# Patient Record
Sex: Male | Born: 1975 | Race: White | Hispanic: No | Marital: Married | Smoking: Former smoker
Health system: Southern US, Community
[De-identification: ages and names within clinical notes are randomized; demographics above are authoritative.]

## PROBLEM LIST (undated history)

## (undated) DIAGNOSIS — E039 Hypothyroidism, unspecified: Secondary | ICD-10-CM

## (undated) DIAGNOSIS — M7918 Myalgia, other site: Secondary | ICD-10-CM

## (undated) DIAGNOSIS — G4730 Sleep apnea, unspecified: Secondary | ICD-10-CM

## (undated) DIAGNOSIS — I10 Essential (primary) hypertension: Secondary | ICD-10-CM

## (undated) DIAGNOSIS — E2740 Unspecified adrenocortical insufficiency: Secondary | ICD-10-CM

## (undated) DIAGNOSIS — F419 Anxiety disorder, unspecified: Secondary | ICD-10-CM

## (undated) DIAGNOSIS — M1990 Unspecified osteoarthritis, unspecified site: Secondary | ICD-10-CM

## (undated) DIAGNOSIS — G8929 Other chronic pain: Secondary | ICD-10-CM

## (undated) DIAGNOSIS — M549 Dorsalgia, unspecified: Secondary | ICD-10-CM

## (undated) DIAGNOSIS — E079 Disorder of thyroid, unspecified: Secondary | ICD-10-CM

## (undated) DIAGNOSIS — M5416 Radiculopathy, lumbar region: Secondary | ICD-10-CM

## (undated) HISTORY — DX: Essential (primary) hypertension: I10

## (undated) HISTORY — DX: Unspecified adrenocortical insufficiency: E27.40

## (undated) HISTORY — DX: Hypothyroidism, unspecified: E03.9

## (undated) HISTORY — DX: Other chronic pain: G89.29

## (undated) HISTORY — DX: Unspecified osteoarthritis, unspecified site: M19.90

## (undated) HISTORY — PX: BACK SURGERY: SHX140

## (undated) HISTORY — DX: Disorder of thyroid, unspecified: E07.9

## (undated) HISTORY — DX: Anxiety disorder, unspecified: F41.9

## (undated) HISTORY — PX: OTHER SURGICAL HISTORY: SHX169

## (undated) HISTORY — DX: Dorsalgia, unspecified: M54.9

## (undated) HISTORY — DX: Sleep apnea, unspecified: G47.30

## (undated) HISTORY — PX: ELBOW SURGERY: SHX618

---
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
## 2007-06-17 ENCOUNTER — Ambulatory Visit: Payer: Self-pay | Admitting: Specialist

## 2010-11-26 ENCOUNTER — Ambulatory Visit: Payer: Self-pay | Admitting: Orthopedic Surgery

## 2010-12-16 ENCOUNTER — Ambulatory Visit: Payer: Self-pay | Admitting: Pain Medicine

## 2010-12-31 ENCOUNTER — Ambulatory Visit: Payer: Self-pay | Admitting: Pain Medicine

## 2011-01-01 ENCOUNTER — Ambulatory Visit: Payer: Self-pay | Admitting: Pain Medicine

## 2011-01-22 ENCOUNTER — Ambulatory Visit: Payer: Self-pay | Admitting: Pain Medicine

## 2011-02-02 ENCOUNTER — Ambulatory Visit: Payer: Self-pay | Admitting: Pain Medicine

## 2011-03-16 ENCOUNTER — Ambulatory Visit: Payer: Self-pay | Admitting: Pain Medicine

## 2011-04-07 ENCOUNTER — Ambulatory Visit: Payer: Self-pay | Admitting: Pain Medicine

## 2011-05-04 ENCOUNTER — Ambulatory Visit: Payer: Self-pay | Admitting: Pain Medicine

## 2011-06-29 ENCOUNTER — Ambulatory Visit: Payer: Self-pay | Admitting: Pain Medicine

## 2012-01-12 ENCOUNTER — Ambulatory Visit: Payer: Self-pay | Admitting: Pain Medicine

## 2012-01-15 ENCOUNTER — Other Ambulatory Visit: Payer: Self-pay | Admitting: Pain Medicine

## 2012-01-15 LAB — HEPATIC FUNCTION PANEL A (ARMC)
Albumin: 3.6 g/dL (ref 3.4–5.0)
Alkaline Phosphatase: 47 U/L — ABNORMAL LOW (ref 50–136)
Bilirubin, Direct: <0.1 mg/dL (ref 0.00–0.20)
Total Protein: 7 g/dL (ref 6.4–8.2)

## 2012-01-21 ENCOUNTER — Ambulatory Visit: Payer: Self-pay | Admitting: Pain Medicine

## 2012-02-16 ENCOUNTER — Ambulatory Visit: Payer: Self-pay | Admitting: Pain Medicine

## 2012-02-25 ENCOUNTER — Ambulatory Visit: Payer: Self-pay | Admitting: Pain Medicine

## 2012-05-19 ENCOUNTER — Ambulatory Visit: Payer: Self-pay | Admitting: Pain Medicine

## 2012-06-28 ENCOUNTER — Ambulatory Visit: Payer: Self-pay | Admitting: Pain Medicine

## 2012-07-08 ENCOUNTER — Ambulatory Visit: Payer: Self-pay | Admitting: Pain Medicine

## 2012-11-17 ENCOUNTER — Ambulatory Visit: Payer: Self-pay | Admitting: Pain Medicine

## 2012-11-24 ENCOUNTER — Ambulatory Visit: Payer: Self-pay | Admitting: Pain Medicine

## 2012-12-07 ENCOUNTER — Ambulatory Visit: Payer: Self-pay | Admitting: Pain Medicine

## 2012-12-13 ENCOUNTER — Ambulatory Visit: Payer: Self-pay | Admitting: Pain Medicine

## 2012-12-19 ENCOUNTER — Other Ambulatory Visit: Payer: Self-pay | Admitting: Pain Medicine

## 2013-01-04 ENCOUNTER — Ambulatory Visit: Payer: Self-pay | Admitting: Pain Medicine

## 2013-01-17 ENCOUNTER — Ambulatory Visit: Payer: Self-pay | Admitting: Pain Medicine

## 2013-01-31 ENCOUNTER — Ambulatory Visit: Payer: Self-pay

## 2013-01-31 ENCOUNTER — Ambulatory Visit: Payer: Self-pay | Admitting: Pain Medicine

## 2013-03-17 ENCOUNTER — Ambulatory Visit: Payer: Self-pay | Admitting: Pain Medicine

## 2013-06-30 ENCOUNTER — Ambulatory Visit: Payer: Self-pay | Admitting: Pain Medicine

## 2013-11-03 ENCOUNTER — Ambulatory Visit: Payer: Self-pay | Admitting: Pain Medicine

## 2014-02-23 ENCOUNTER — Ambulatory Visit: Payer: Self-pay | Admitting: Pain Medicine

## 2014-03-20 ENCOUNTER — Ambulatory Visit: Payer: Self-pay | Admitting: Pain Medicine

## 2014-04-30 ENCOUNTER — Ambulatory Visit: Payer: Self-pay | Admitting: Pain Medicine

## 2014-05-07 ENCOUNTER — Ambulatory Visit: Payer: Self-pay | Admitting: Pain Medicine

## 2014-05-08 ENCOUNTER — Ambulatory Visit: Payer: Self-pay | Admitting: Pain Medicine

## 2014-07-17 ENCOUNTER — Emergency Department: Payer: Self-pay | Admitting: Emergency Medicine

## 2014-07-20 ENCOUNTER — Encounter: Payer: Self-pay | Admitting: Cardiovascular Disease

## 2014-07-20 ENCOUNTER — Ambulatory Visit (INDEPENDENT_AMBULATORY_CARE_PROVIDER_SITE_OTHER): Payer: 59 | Admitting: Cardiovascular Disease

## 2014-07-20 VITALS — BP 118/84 | HR 79 | Ht 73.0 in | Wt 270.8 lb

## 2014-07-20 DIAGNOSIS — I159 Secondary hypertension, unspecified: Secondary | ICD-10-CM

## 2014-07-20 DIAGNOSIS — I10 Essential (primary) hypertension: Secondary | ICD-10-CM | POA: Insufficient documentation

## 2014-07-20 DIAGNOSIS — E039 Hypothyroidism, unspecified: Secondary | ICD-10-CM | POA: Insufficient documentation

## 2014-07-20 DIAGNOSIS — E2749 Other adrenocortical insufficiency: Secondary | ICD-10-CM

## 2014-07-20 DIAGNOSIS — R0602 Shortness of breath: Secondary | ICD-10-CM

## 2014-07-20 DIAGNOSIS — R0789 Other chest pain: Secondary | ICD-10-CM | POA: Insufficient documentation

## 2014-07-20 DIAGNOSIS — E2740 Unspecified adrenocortical insufficiency: Secondary | ICD-10-CM | POA: Insufficient documentation

## 2014-07-20 DIAGNOSIS — R079 Chest pain, unspecified: Secondary | ICD-10-CM

## 2014-07-20 NOTE — Patient Instructions (Signed)
Your physician has requested that you have an echocardiogram. Echocardiography is a painless test that uses sound waves to create images of your heart. It provides your doctor with information about the size and shape of your heart and how well your heart's chambers and valves are working. This procedure takes approximately one hour. There are no restrictions for this procedure.  Your physician has requested that you have a stress echocardiogram. For further information please visit [email or web address]. Please follow instruction sheet as given.  Please follow-up with Dr. Acie Fredrickson in 1-2 months.

## 2014-07-20 NOTE — Assessment & Plan Note (Signed)
Dean Ford presents for further evaluation of chest discomfort. He has episodes of chest pain that occur typically with exertion. He also has chest discomfort with stress. Complicating  this is that he quite likely also has obstructive sleep apnea.  He has a mitral valve click on exam.  We'll see him back next week for a full echocardiogram to evaluate his LV function and valvular function. I would also like to do a stress echocardiogram to evaluate him for the possibility of coronary ischemia. He has a family history of coronary ischemia. I'll see him back in the office in one to 2 months for follow-up visit.

## 2014-07-20 NOTE — Assessment & Plan Note (Signed)
His blood pressure is fairly well controlled at present.

## 2014-07-20 NOTE — Progress Notes (Signed)
Dean Ford Date of Birth  1976/07/23       [facility] [address] [place], [place], [place]  [address]   [place], [place]  [address] [place]   Fax  [phone]     Fax [phone]  Problem List: 1. Hypertension 2. Chest pain 3.  Hypothyroidism 4. ? Obstructive sleep apnea   History of Present Illness:  [name] is a 38 who is referred by Dr. Debbora Dus for evaluation of his CP.  He has a hx of HTN and also has hypoadrenalism, hypothyroidism, low testosterone.  CP is just left of center.  Pain seems to be related to emotional stress.  Starts with dyspnea.  He would typically get shortness of breath, head ache and then get chest pain. Would take another losartan and all the symptoms would improve after an hour. Associated with exertion - walking .  Seems to be worse in the afternoon  He snores at night. Wakes up gasping for air frequently. Has been referred for sleep study.  He did not make the appt.  Does not get any regular exercise.   Has low energy due to his endocrine issues. Has had a head MRI. pituitary gland was ok. Has had cholesterol checked in the past - was borderline years ago.  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  Was not able to complete his last training session because of shortness of breath.      No current outpatient prescriptions on file prior to visit.   No current facility-administered medications on file prior to visit.    No Known Allergies  Past Medical History  Diagnosis Date  . Hypertension   . Thyroid disease   . Hypothyroid     Past Surgical History  Procedure Laterality Date  . Elbow surgery      right elbow    History  Smoking status  . Never Smoker   Smokeless tobacco  . Not on file    History  Alcohol  Use No    Family History  Problem Relation Age of Onset  . Heart attack Father 34  . Hypertension Father     Reviw of Systems:  Reviewed in the HPI.  All other systems are negative.  Physical Exam: Blood pressure 118/84, pulse 79, height 6\' 1"  (1.854 m), weight 270 lb 12 oz (122.811 kg). Wt Readings from Last 3 Encounters:  07/20/14 270 lb 12 oz (122.811 kg)     General: Well developed, well nourished, in no acute distress.  Head: Normocephalic, atraumatic, sclera non-icteric, mucus membranes are moist,   Neck: Supple. Carotids are 2 + without bruits. No JVD   Lungs: Clear   Heart: RR, mid systolic click, no significant murmur  Abdomen: Soft, non-tender, non-distended with normal bowel sounds., moderate obesity  Msk:  Strength and tone are normal   Extremities: No clubbing or cyanosis. No edema.  Distal pedal pulses are 2+ and equal    Neuro: CN II - XII intact.  Alert and oriented X 3.   Psych:  Normal   ECG: 07/20/2014: Normal sinus rhythm. Heart rate is 79. He has nonspecific ST and T wave changes.  Assessment / Plan:

## 2014-08-03 ENCOUNTER — Other Ambulatory Visit: Payer: Self-pay

## 2014-08-03 ENCOUNTER — Other Ambulatory Visit (INDEPENDENT_AMBULATORY_CARE_PROVIDER_SITE_OTHER): Payer: 59

## 2014-08-03 DIAGNOSIS — R079 Chest pain, unspecified: Secondary | ICD-10-CM

## 2014-08-03 DIAGNOSIS — R0602 Shortness of breath: Secondary | ICD-10-CM

## 2014-08-03 DIAGNOSIS — I159 Secondary hypertension, unspecified: Secondary | ICD-10-CM

## 2014-08-21 ENCOUNTER — Other Ambulatory Visit (INDEPENDENT_AMBULATORY_CARE_PROVIDER_SITE_OTHER): Payer: 59

## 2014-08-21 DIAGNOSIS — R0602 Shortness of breath: Secondary | ICD-10-CM

## 2014-08-21 DIAGNOSIS — R079 Chest pain, unspecified: Secondary | ICD-10-CM

## 2014-08-21 DIAGNOSIS — I159 Secondary hypertension, unspecified: Secondary | ICD-10-CM

## 2014-09-20 ENCOUNTER — Ambulatory Visit (INDEPENDENT_AMBULATORY_CARE_PROVIDER_SITE_OTHER): Payer: 59 | Admitting: Cardiovascular Disease

## 2014-09-20 ENCOUNTER — Encounter: Payer: Self-pay | Admitting: Cardiovascular Disease

## 2014-09-20 VITALS — BP 138/100 | HR 80 | Ht 74.0 in | Wt 278.0 lb

## 2014-09-20 DIAGNOSIS — I10 Essential (primary) hypertension: Secondary | ICD-10-CM

## 2014-09-20 MED ORDER — POTASSIUM CHLORIDE ER 10 MEQ PO TBCR: 10.0000 meq | EXTENDED_RELEASE_TABLET | Freq: Every day | ORAL | Status: DC

## 2014-09-20 MED ORDER — HYDROCHLOROTHIAZIDE 25 MG PO TABS: 25.0000 mg | ORAL_TABLET | Freq: Every day | ORAL | Status: DC

## 2014-09-20 MED ORDER — LOSARTAN POTASSIUM 100 MG PO TABS: 100.0000 mg | ORAL_TABLET | Freq: Every day | ORAL | Status: DC

## 2014-09-20 NOTE — Patient Instructions (Signed)
Your physician has recommended you make the following change in your medication:  Restart Losartan 100 mg once daily  Start Hydrochlorothiazide 25 mg once daily  Start Potassium (Kdur) 10 meq once daily    Your physician recommends that you return for lab work in:  BMP in 3-4 weeks   Your physician recommends that you schedule a follow-up appointment in:  3 months

## 2014-09-20 NOTE — Progress Notes (Signed)
Cardiology Office Note   Date:  09/20/2014   ID:  Dean Ford, DOB May 17, 1976, MRN [id number]  PCP:  Dean Dus, MD  Cardiologist:   Dean Headings, MD   Chief Complaint  Patient presents with  . Follow-up    HTN, CP   1. Hypertension 2. Chest pain 3. Hypothyroidism 4. ? Obstructive sleep apnea   History of Present Illness:  Dean Ford is a 39 who is referred by Dr. Debbora Ford for evaluation of his CP. He has a hx of HTN and also has hypoadrenalism, hypothyroidism, low testosterone.  CP is just left of center. Pain seems to be related to emotional stress. Starts with dyspnea. He would typically get shortness of breath, head ache and then get chest pain. Would take another losartan and all the symptoms would improve after an hour. Associated with exertion - walking . Seems to be worse in the afternoon  He snores at night. Wakes up gasping for air frequently. Has been referred for sleep study. He did not make the appt.  Does not get any regular exercise.  Has low energy due to his endocrine issues. Has had a head MRI. pituitary gland was ok. Has had cholesterol checked in the past - was borderline years ago.  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   September 20, 2014:  Dean Ford is a 39 y.o. male who presents for follow-up for his chest pain. He had an echocardiogram that revealed normal left ventricle systolic function. He had a stress echo ( results are not found at this time) He has not been on his BP med.   BP has been higher .    No further CP,  Not exercising at all.     Past Medical History  Diagnosis Date  . Hypertension   . Thyroid disease   . Hypothyroid     Past Surgical History  Procedure Laterality Date  . Elbow surgery        right elbow     Current Outpatient Prescriptions  Medication Sig Dispense Refill  . hydrocortisone (CORTEF) 10 MG tablet Take 10 mg by mouth daily.     Marland Kitchen levothyroxine (SYNTHROID, LEVOTHROID) 75 MCG tablet Take 75 mcg by mouth daily before breakfast.     . testosterone cypionate (DEPOTESTOTERONE CYPIONATE) 200 MG/ML injection Inject 100 mg into the muscle every 14 (fourteen) days.     . traMADol (ULTRAM) 50 MG tablet Take 100 mg by mouth every 6 (six) hours as needed.     Marland Kitchen losartan (COZAAR) 100 MG tablet Take 100 mg by mouth daily.      No current facility-administered medications for this visit.    Allergies:   Review of patient's allergies indicates no known allergies.    Social History:  The patient  reports that he has never smoked. He does not have any smokeless tobacco history on file. He reports that he does not drink alcohol or use illicit drugs.   Family History:  The patient's family history includes Heart attack (age of onset: 62) in his father; Hypertension in his father.    ROS:  Please see the history of present illness.    Review of Systems: Constitutional:  denies fever, chills, diaphoresis,  appetite change and fatigue.  HEENT: denies photophobia, eye pain, redness, hearing loss, ear pain, congestion, sore throat, rhinorrhea, sneezing, neck pain, neck stiffness and tinnitus.  Respiratory: denies SOB, DOE, cough, chest tightness, and wheezing.  Cardiovascular: denies chest pain, palpitations and leg swelling.  Gastrointestinal: denies nausea, vomiting, abdominal pain, diarrhea, constipation, blood in stool.  Genitourinary: denies dysuria, urgency, frequency, hematuria, flank pain and difficulty urinating.  Musculoskeletal: denies  myalgias, back pain, joint swelling, arthralgias and gait problem.   Skin: denies pallor, rash and wound.  Neurological: denies dizziness, seizures, syncope, weakness, light-headedness, numbness and headaches.   Hematological: denies  adenopathy, easy bruising, personal or family bleeding history.  Psychiatric/ Behavioral: denies suicidal ideation, mood changes, confusion, nervousness, sleep disturbance and agitation.       All other systems are reviewed and negative.    PHYSICAL EXAM: VS:  BP 138/100 mmHg  Pulse 80  Ht 6\' 2"  (1.88 m)  Wt 278 lb (126.1 kg)  BMI 35.68 kg/m2 , BMI Body mass index is 35.68 kg/(m^2). GEN: Well nourished, well developed, in no acute distress HEENT: normal Neck: no JVD, carotid bruits, or masses Cardiac: RRR;  mmid systolic click  no murmurs, rubs, or gallops,no edema  Respiratory:  clear to auscultation bilaterally, normal work of breathing GI: soft, nontender, nondistended, + BS MS: no deformity or atrophy Skin: warm and dry, no rash Neuro:  Strength and sensation are intact Psych: normal   EKG:  EKG is not ordered today.    Recent Labs: No results found for requested labs within last 365 days.    Lipid Panel No results found for: CHOL, TRIG, HDL, CHOLHDL, VLDL, LDLCALC, LDLDIRECT    Wt Readings from Last 3 Encounters:  09/20/14 278 lb (126.1 kg)  07/20/14 270 lb 12 oz (122.811 kg)      Other studies Reviewed: Additional studies/ records that were reviewed today include:  Stress echo and resting echo . Review of the above records demonstrates: normal LV function    ASSESSMENT AND PLAN:  1. Hypertension - will restart losartan 100 .  Will also add HCTZ 25 and Kdur 10 a day.   BMP in 3 weeks. Will see him in 3 months.    2. Chest pain- not having any further episodes of CP  3. Hypothyroidism - continue meds  4. ? Obstructive sleep apnea - needs to go have his sleep study.    Current medicines are reviewed at length with the patient today.  The patient does not have concerns regarding medicines.  The following changes have been made:  Restart Losartan, HCTZ, kdur   Labs/ tests ordered today include:  No orders of the defined types were placed in this  encounter.     Disposition:   FU with me in 3 months.  BMP in 3 weeks.      Signed, Nahser, Dean Cheng, MD  09/20/2014 8:46 AM    [facility] [place], [place], [place]  [address] Phone: [phone]; Fax: [phone]

## 2014-10-11 ENCOUNTER — Other Ambulatory Visit (INDEPENDENT_AMBULATORY_CARE_PROVIDER_SITE_OTHER): Payer: 59 | Admitting: *Deleted

## 2014-10-11 DIAGNOSIS — I10 Essential (primary) hypertension: Secondary | ICD-10-CM

## 2014-10-12 LAB — BASIC METABOLIC PANEL
BUN / CREAT RATIO: 13 (ref 8–19)
BUN: 12 mg/dL (ref 6–20)
CO2: 20 mmol/L (ref 18–29)
CREATININE: 0.95 mg/dL (ref 0.76–1.27)
Calcium: 8.8 mg/dL (ref 8.7–10.2)
Chloride: 100 mmol/L (ref 97–108)
GFR, EST AFRICAN AMERICAN: 116 mL/min/{1.73_m2} (ref >59)
GFR, EST NON AFRICAN AMERICAN: 100 mL/min/{1.73_m2} (ref >59)
Glucose: 97 mg/dL (ref 65–99)
Potassium: 4.3 mmol/L (ref 3.5–5.2)
SODIUM: 137 mmol/L (ref 134–144)

## 2014-11-15 ENCOUNTER — Encounter: Payer: Self-pay | Admitting: *Deleted

## 2014-12-04 ENCOUNTER — Emergency Department
Admit: 2014-12-04 | Disposition: A | Discharge status: Home / Self Care | Payer: Self-pay | Admitting: Emergency Medicine

## 2014-12-04 LAB — CBC
HCT: 46.8 % (ref 40.0–52.0)
HGB: 15.9 g/dL (ref 13.0–18.0)
MCH: 28.7 pg (ref 26.0–34.0)
MCHC: 34 g/dL (ref 32.0–36.0)
MCV: 85 fL (ref 80–100)
PLATELETS: 235 10*3/uL (ref 150–440)
RBC: 5.54 10*6/uL (ref 4.40–5.90)
RDW: 13.5 % (ref 11.5–14.5)
WBC: 6.5 10*3/uL (ref 3.8–10.6)

## 2014-12-04 LAB — TROPONIN I
Troponin-I: <0.03 ng/mL
Troponin-I: <0.03 ng/mL

## 2014-12-04 LAB — BASIC METABOLIC PANEL
ANION GAP: 7 (ref 7–16)
BUN: 13 mg/dL
CALCIUM: 8.6 mg/dL — AB
CREATININE: 1.01 mg/dL
Chloride: 104 mmol/L
Co2: 28 mmol/L
EGFR (Non-African Amer.): >60
Glucose: 101 mg/dL — ABNORMAL HIGH
Potassium: 4 mmol/L
SODIUM: 139 mmol/L

## 2014-12-04 LAB — D-DIMER(ARMC): D-DIMER: 175 ng/mL

## 2014-12-13 ENCOUNTER — Ambulatory Visit (INDEPENDENT_AMBULATORY_CARE_PROVIDER_SITE_OTHER): Payer: 59 | Admitting: Cardiovascular Disease

## 2014-12-13 ENCOUNTER — Encounter: Payer: Self-pay | Admitting: Cardiovascular Disease

## 2014-12-13 VITALS — BP 106/78 | HR 71 | Ht 74.0 in | Wt 274.0 lb

## 2014-12-13 DIAGNOSIS — R0602 Shortness of breath: Secondary | ICD-10-CM | POA: Diagnosis not present

## 2014-12-13 NOTE — Patient Instructions (Signed)
Medication Instructions:  Your physician recommends that you continue on your current medications as directed. Please refer to the Current Medication list given to you today.   Labwork: None   Testing/Procedures: None   Follow-Up: Your physician wants you to follow-up in: 6 months with Dr.Nahser You will receive a reminder letter in the mail two months in advance. If you don't receive a letter, please call our office to schedule the follow-up appointment.   Any Other Special Instructions Will Be Listed Below (If Applicable).

## 2014-12-13 NOTE — Progress Notes (Signed)
Cardiology Office Note   Date:  12/13/2014   ID:  Dean Ford, DOB [birth year], MRN [id number]  PCP:  Keith Rake, MD  Cardiologist:   Thayer Headings, MD   Chief Complaint  Patient presents with  . Chest Pain    3 month follow up. Meds reviewed by the patient verbally. Pt. went to [facility] ER last week with chest pain.    1. Hypertension 2. Chest pain - had a negative stress echo in Jan. 2016 3. Hypothyroidism 4. ? Obstructive sleep apnea   History of Present Illness:  Dean Ford is a 39 who is referred by Dr. Debbora Dus for evaluation of his CP. He has a hx of HTN and also has hypoadrenalism, hypothyroidism, low testosterone.  CP is just left of center. Pain seems to be related to emotional stress. Starts with dyspnea. He would typically get shortness of breath, head ache and then get chest pain. Would take another losartan and all the symptoms would improve after an hour. Associated with exertion - walking . Seems to be worse in the afternoon  He snores at night. Wakes up gasping for air frequently. Has been referred for sleep study. He did not make the appt.  Does not get any regular exercise.  Has low energy due to his endocrine issues. Has had a head MRI. pituitary gland was ok. Has had cholesterol checked in the past - was borderline years ago.  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   September 20, 2014:  Dean Ford is a 39 y.o. male who presents for follow-up for his chest pain. He had an echocardiogram that revealed normal left ventricle systolic function. He had a stress echo ( results are not found at this time) He has not been on his BP med.   BP has been higher .    No further CP,  Not exercising at all.   December 13, 2014:  Dean Ford had some  chest pain - mid sternal, radiated through his back Worsened with deep breath / deep exhale Went to the [facility] ER  Work up was negative  Has felt fine since that time.   Has been working out vigorously without any chest    Past Medical History  Diagnosis Date  . Hypertension   . Thyroid disease   . Hypothyroid     Past Surgical History  Procedure Laterality Date  . Elbow surgery      right elbow     Current Outpatient Prescriptions  Medication Sig Dispense Refill  . hydrochlorothiazide (HYDRODIURIL) 25 MG tablet Take 1 tablet (25 mg total) by mouth daily. 30 tablet 6  . hydrocortisone (CORTEF) 10 MG tablet Take 10 mg by mouth daily.     Marland Kitchen levothyroxine (SYNTHROID, LEVOTHROID) 75 MCG tablet Take 75 mcg by mouth daily before breakfast.     . losartan (COZAAR) 100 MG tablet Take 1 tablet (100 mg total) by mouth daily. 30 tablet 6  . potassium chloride (K-DUR) 10 MEQ tablet Take 1 tablet (10 mEq total) by mouth daily. 30 tablet 6  . testosterone cypionate (DEPOTESTOTERONE CYPIONATE) 200 MG/ML injection Inject 100 mg into the muscle every 14 (fourteen) days.     . traMADol (ULTRAM) 50 MG tablet Take  100 mg by mouth every 6 (six) hours as needed.      No current facility-administered medications for this visit.    Allergies:   Review of patient's allergies indicates no known allergies.    Social History:  The patient  reports that he has never smoked. He does not have any smokeless tobacco history on file. He reports that he does not drink alcohol or use illicit drugs.   Family History:  The patient's family history includes Heart attack (age of onset: 59) in his father; Hypertension in his father.    ROS:  Please see the history of present illness.    Review of Systems: Constitutional:  denies fever, chills, diaphoresis, appetite change and fatigue.  HEENT: denies photophobia, eye pain, redness, hearing loss, ear pain, congestion, sore throat, rhinorrhea, sneezing, neck  pain, neck stiffness and tinnitus.  Respiratory: denies SOB, DOE, cough, chest tightness, and wheezing.  Cardiovascular: denies chest pain, palpitations and leg swelling.  Gastrointestinal: denies nausea, vomiting, abdominal pain, diarrhea, constipation, blood in stool.  Genitourinary: denies dysuria, urgency, frequency, hematuria, flank pain and difficulty urinating.  Musculoskeletal: denies  myalgias, back pain, joint swelling, arthralgias and gait problem.   Skin: denies pallor, rash and wound.  Neurological: denies dizziness, seizures, syncope, weakness, light-headedness, numbness and headaches.   Hematological: denies adenopathy, easy bruising, personal or family bleeding history.  Psychiatric/ Behavioral: denies suicidal ideation, mood changes, confusion, nervousness, sleep disturbance and agitation.       All other systems are reviewed and negative.    PHYSICAL EXAM: VS:  BP 106/78 mmHg  Pulse 71  Ht 6\' 2"  (1.88 m)  Wt 274 lb (124.286 kg)  BMI 35.16 kg/m2 , BMI Body mass index is 35.16 kg/(m^2). GEN: Well nourished, well developed, in no acute distress HEENT: normal Neck: no JVD, carotid bruits, or masses Cardiac: RRR;  mmid systolic click  no murmurs, rubs, or gallops,no edema  Respiratory:  clear to auscultation bilaterally, normal work of breathing GI: soft, nontender, nondistended, + BS MS: no deformity or atrophy Skin: warm and dry, no rash Neuro:  Strength and sensation are intact Psych: normal   EKG:  EKG is ordered today.  ECG reveals:  NSR at 76.  NS ST abn.  No acute changes     Recent Labs: 10/11/2014: BUN 12; Creatinine 0.95; Potassium 4.3; Sodium 137    Lipid Panel No results found for: CHOL, TRIG, HDL, CHOLHDL, VLDL, LDLCALC, LDLDIRECT    Wt Readings from Last 3 Encounters:  12/13/14 274 lb (124.286 kg)  09/20/14 278 lb (126.1 kg)  07/20/14 270 lb 12 oz (122.811 kg)      Other studies Reviewed: Additional studies/ records that were reviewed  today include:  Stress echo and resting echo . Review of the above records demonstrates: normal LV function    ASSESSMENT AND PLAN:  1. Hypertension - his BP is well controlled.  Continue same meds.  I've encouraged him to work on weight loss   2. Chest pain- he had an episode of chest pain that was very atypical. His workup at [facility] ER  was   negative. He had a negative stress echo in January. I do not think that he needs any additional workup.   3. Hypothyroidism - continue meds  4. ? Obstructive sleep apnea - needs to go have his sleep study.   5. Adrenal insufficiency:. He's followed by an endocrinologist. Continue current dose of Cortef.   Current medicines are reviewed at length with the  patient today.  The patient does not have concerns regarding medicines.  The following changes have been made: No changes.   Labs/ tests ordered today include:   Orders Placed This Encounter  Procedures  . EKG 12-Lead     Disposition:   FU with me in 6 months.     Signed, Nahser, Wonda Cheng, MD  12/13/2014 8:49 AM    [facility] [place], [place], [place]  [address] Phone: [phone]; Fax: [phone]

## 2015-01-28 ENCOUNTER — Ambulatory Visit (INDEPENDENT_AMBULATORY_CARE_PROVIDER_SITE_OTHER): Payer: 59 | Admitting: Family Medicine

## 2015-01-28 ENCOUNTER — Encounter (INDEPENDENT_AMBULATORY_CARE_PROVIDER_SITE_OTHER): Payer: Self-pay

## 2015-01-28 ENCOUNTER — Encounter: Payer: Self-pay | Admitting: Family Medicine

## 2015-01-28 VITALS — BP 110/60 | HR 103 | Resp 16 | Ht 72.0 in | Wt 272.3 lb

## 2015-01-28 DIAGNOSIS — E2749 Other adrenocortical insufficiency: Secondary | ICD-10-CM | POA: Diagnosis not present

## 2015-01-28 DIAGNOSIS — F411 Generalized anxiety disorder: Secondary | ICD-10-CM

## 2015-01-28 DIAGNOSIS — F419 Anxiety disorder, unspecified: Secondary | ICD-10-CM

## 2015-01-28 DIAGNOSIS — R638 Other symptoms and signs concerning food and fluid intake: Secondary | ICD-10-CM | POA: Insufficient documentation

## 2015-01-28 HISTORY — DX: Anxiety disorder, unspecified: F41.9

## 2015-01-28 MED ORDER — CLONAZEPAM 0.5 MG PO TABS: ORAL_TABLET | ORAL | Status: DC

## 2015-01-28 NOTE — Progress Notes (Signed)
Name: Dean Ford   MRN: [id number]    DOB: Jan 01, 1976   Date:01/28/2015       Progress Note  Subjective  Chief Complaint  Chief Complaint  Patient presents with  . Anxiety    HPI Comments: Had dizziness and lightheadedness two days ago (Saturday), where he was outside during the day from 8AM to [place] and then later in the afternoon to [place] the yard. He was staying hydrated throughout the day but had a beer Saturday evening and developed a headache, nauseated feeling, and malaise. He was staying hydrated and had water plus gatorade.  Anxiety Presents for follow-up visit. Symptoms include confusion, dizziness, insomnia, irritability, muscle tension, nausea and nervous/anxious behavior. Patient reports no panic, restlessness or shortness of breath. Symptoms occur most days. The severity of symptoms is moderate.   There is no history of bipolar disorder or depression. Past treatments include benzodiazephines. Compliance with prior treatments has been good.  Dizziness The current episode started in the past 7 days. The problem has been resolved. Associated symptoms include nausea and weakness. Pertinent negatives include no chills or fever. He has tried drinking and lying down for the symptoms. The treatment provided significant relief.      Past Medical History  Diagnosis Date  . Hypertension   . Thyroid disease   . Hypothyroid   . Anxiety     Past Surgical History  Procedure Laterality Date  . Elbow surgery      right elbow    Family History  Problem Relation Age of Onset  . Heart attack Father 51  . Hypertension Father     History   Social History  . Marital Status: Married    Spouse Name: N/A  . Number of Children: N/A  . Years of Education: N/A   Occupational History  . Not on file.   Social History Main Topics  . Smoking status: Former Research scientist (life sciences)  . Smokeless tobacco: Former Systems developer  . Alcohol Use: No  . Drug Use: No  . Sexual Activity: Not on file   Other Topics  Concern  . Not on file   Social History Narrative     Current outpatient prescriptions:  .  clonazePAM (KLONOPIN) 0.5 MG tablet, 0.84m in AM PRN, 0.571mQHS PRN, Disp: 45 tablet, Rfl: 0 .  hydrochlorothiazide (HYDRODIURIL) 25 MG tablet, Take 1 tablet (25 mg total) by mouth daily., Disp: 30 tablet, Rfl: 6 .  hydrocortisone (CORTEF) 10 MG tablet, Take 10 mg by mouth daily. , Disp: , Rfl:  .  levothyroxine (SYNTHROID, LEVOTHROID) 75 MCG tablet, Take 75 mcg by mouth daily before breakfast. , Disp: , Rfl:  .  losartan (COZAAR) 100 MG tablet, Take 1 tablet (100 mg total) by mouth daily., Disp: 30 tablet, Rfl: 6 .  potassium chloride (K-DUR) 10 MEQ tablet, Take 1 tablet (10 mEq total) by mouth daily., Disp: 30 tablet, Rfl: 6 .  testosterone cypionate (DEPOTESTOTERONE CYPIONATE) 200 MG/ML injection, Inject 100 mg into the muscle every 14 (fourteen) days. , Disp: , Rfl:  .  traMADol (ULTRAM) 50 MG tablet, Take 100 mg by mouth every 6 (six) hours as needed. , Disp: , Rfl:   No Known Allergies   Review of Systems  Constitutional: Positive for malaise/fatigue and irritability. Negative for fever and chills.  Respiratory: Negative for shortness of breath.   Gastrointestinal: Positive for nausea.  Neurological: Positive for dizziness and weakness.  Psychiatric/Behavioral: Positive for confusion. Negative for depression. The patient is nervous/anxious and has  insomnia.       Objective  Filed Vitals:   01/28/15 0845  BP: 110/60  Pulse: 103  Resp: 16  Height: 6' (1.829 m)  Weight: 272 lb 4.8 oz (123.514 kg)  SpO2: 97%    Physical Exam  Constitutional: He is oriented to person, place, and time and well-developed, well-nourished, and in no distress.  HENT:  Head: Normocephalic and atraumatic.  Cardiovascular: Normal rate and regular rhythm.   Pulmonary/Chest: Effort normal.  Neurological: He is alert and oriented to person, place, and time.  Psychiatric: Mood, memory, affect and  judgment normal.  Nursing note and vitals reviewed.         Assessment & Plan  1. Generalized anxiety disorder We will change clonazepam to 0.25 mg in a.m. and 0.5 mg at bedtime as needed for symptoms of anxiety. Appears to be working well for patient. Refills provided and follow-up in 4-6 weeks. - clonazePAM (KLONOPIN) 0.5 MG tablet; 0.95m in AM PRN, 0.554mQHS PRN  Dispense: 45 tablet; Refill: 0  2. Dehydration symptoms Patient apparently experienced symptoms of dizziness, headaches, nausea, and malaise. Most likely from dehydration. Symptoms have resolved today. We will check a metabolic panel to evaluate. - Comp Met (CMET)  3. Adrenal failure  Patient is being followed by endocrinology    SyOkey Dupre. [facility] 01/28/2015 9:20 AM

## 2015-01-29 LAB — COMPREHENSIVE METABOLIC PANEL
A/G RATIO: 2.2 (ref 1.1–2.5)
ALBUMIN: 4.7 g/dL (ref 3.5–5.5)
ALT: 29 IU/L (ref 0–44)
AST: 21 IU/L (ref 0–40)
Alkaline Phosphatase: 38 IU/L — ABNORMAL LOW (ref 39–117)
BUN/Creatinine Ratio: 12 (ref 9–20)
BUN: 13 mg/dL (ref 6–24)
Bilirubin Total: 0.4 mg/dL (ref 0.0–1.2)
CALCIUM: 9.7 mg/dL (ref 8.7–10.2)
CO2: 24 mmol/L (ref 18–29)
Chloride: 102 mmol/L (ref 97–108)
Creatinine, Ser: 1.13 mg/dL (ref 0.76–1.27)
GFR calc Af Amer: 93 mL/min/{1.73_m2} (ref >59)
GFR, EST NON AFRICAN AMERICAN: 81 mL/min/{1.73_m2} (ref >59)
Globulin, Total: 2.1 g/dL (ref 1.5–4.5)
Glucose: 100 mg/dL — ABNORMAL HIGH (ref 65–99)
Potassium: 4.7 mmol/L (ref 3.5–5.2)
SODIUM: 144 mmol/L (ref 134–144)
Total Protein: 6.8 g/dL (ref 6.0–8.5)

## 2015-03-01 ENCOUNTER — Ambulatory Visit (INDEPENDENT_AMBULATORY_CARE_PROVIDER_SITE_OTHER): Payer: 59 | Admitting: Family Medicine

## 2015-03-01 ENCOUNTER — Encounter: Payer: Self-pay | Admitting: Family Medicine

## 2015-03-01 VITALS — BP 115/80 | HR 105 | Temp 98.6°F | Resp 17 | Ht 72.0 in | Wt 277.1 lb

## 2015-03-01 DIAGNOSIS — R109 Unspecified abdominal pain: Secondary | ICD-10-CM

## 2015-03-01 DIAGNOSIS — R1012 Left upper quadrant pain: Secondary | ICD-10-CM

## 2015-03-01 DIAGNOSIS — E785 Hyperlipidemia, unspecified: Secondary | ICD-10-CM | POA: Insufficient documentation

## 2015-03-01 DIAGNOSIS — F411 Generalized anxiety disorder: Secondary | ICD-10-CM

## 2015-03-01 DIAGNOSIS — E782 Mixed hyperlipidemia: Secondary | ICD-10-CM | POA: Insufficient documentation

## 2015-03-01 MED ORDER — CLONAZEPAM 0.5 MG PO TABS: ORAL_TABLET | ORAL | Status: DC

## 2015-03-01 NOTE — Progress Notes (Signed)
Name: Dean Ford   MRN: [id number]    DOB: 11-Feb-1976   Date:03/01/2015       Progress Note  Subjective  Chief Complaint  Chief Complaint  Patient presents with  . Follow-up    4wk  . Back Pain    lower back left side    Anxiety Presents for follow-up visit. Symptoms include decreased concentration, excessive worry, irritability, malaise, muscle tension and nervous/anxious behavior. Patient reports no nausea.   Past treatments include benzodiazephines. The treatment provided significant relief. Compliance with prior treatments has been good.   patient on clonazepam 0.25 mg in a.m. and 0.5 mg daily at bedtime. This has worked well for him in reducing his symptoms of anxiety. Pt. Is here for left sided flank/back pain. Present since last 2 days. He describes it as if its 'coming from my kidneys'. When he woke up two days ago, the area felt 'hot' but no rashes. No no urinary problems such as dysuria, hematuria, frequency, fevers, chills, nausea, or vomiting. Pain is worse with palpation. He has not taken anything for this pain. No history of kidney disease.   Past Medical History  Diagnosis Date  . Hypertension   . Thyroid disease   . Hypothyroid   . Anxiety     Past Surgical History  Procedure Laterality Date  . Elbow surgery      right elbow    Family History  Problem Relation Age of Onset  . Heart attack Father 3  . Hypertension Father     History   Social History  . Marital Status: Married    Spouse Name: N/A  . Number of Children: N/A  . Years of Education: N/A   Occupational History  . Not on file.   Social History Main Topics  . Smoking status: Former Research scientist (life sciences)  . Smokeless tobacco: Former Systems developer  . Alcohol Use: No  . Drug Use: No  . Sexual Activity: Not on file   Other Topics Concern  . Not on file   Social History Narrative     Current outpatient prescriptions:  .  clonazePAM (KLONOPIN) 0.5 MG tablet, 0.25mg  in AM PRN, 0.5mg  QHS PRN, Disp: 45  tablet, Rfl: 0 .  hydrochlorothiazide (HYDRODIURIL) 25 MG tablet, Take 1 tablet (25 mg total) by mouth daily., Disp: 30 tablet, Rfl: 6 .  hydrocortisone (CORTEF) 10 MG tablet, Take 10 mg by mouth daily. , Disp: , Rfl:  .  levothyroxine (SYNTHROID, LEVOTHROID) 75 MCG tablet, Take 75 mcg by mouth daily before breakfast. , Disp: , Rfl:  .  losartan (COZAAR) 100 MG tablet, Take 1 tablet (100 mg total) by mouth daily., Disp: 30 tablet, Rfl: 6 .  potassium chloride (K-DUR) 10 MEQ tablet, Take 1 tablet (10 mEq total) by mouth daily., Disp: 30 tablet, Rfl: 6 .  testosterone cypionate (DEPOTESTOTERONE CYPIONATE) 200 MG/ML injection, Inject 100 mg into the muscle every 14 (fourteen) days. , Disp: , Rfl:  .  traMADol (ULTRAM) 50 MG tablet, Take 100 mg by mouth every 6 (six) hours as needed. , Disp: , Rfl:   No Known Allergies   Review of Systems  Constitutional: Positive for irritability. Negative for fever, chills and malaise/fatigue.  Gastrointestinal: Negative for nausea and vomiting.  Genitourinary: Positive for flank pain. Negative for dysuria, urgency, frequency and hematuria.  Psychiatric/Behavioral: Positive for decreased concentration. Negative for depression. The patient is nervous/anxious.       Objective  Filed Vitals:   03/01/15 0816  BP: 115/80  Pulse: 105  Temp: 98.6 F (37 C)  TempSrc: Oral  Resp: 17  Height: 6' (1.829 m)  Weight: 277 lb 1.6 oz (125.692 kg)  SpO2: 95%    Physical Exam  Constitutional: He is oriented to person, place, and time and well-developed, well-nourished, and in no distress.  Cardiovascular: Normal rate and regular rhythm.   Pulmonary/Chest: Effort normal and breath sounds normal.  Abdominal: Soft. Bowel sounds are normal.  Musculoskeletal:       Lumbar back: He exhibits tenderness and pain. He exhibits no spasm.       Back:  Neurological: He is alert and oriented to person, place, and time.  Skin: Skin is warm and dry.  Psychiatric: Mood,  memory, affect and judgment normal.  Nursing note and vitals reviewed.    Assessment & Plan 1. Acute left flank pain We will rule out renal pathology by obtaining kidney function, urinalysis, and a renal ultrasound for evaluation of this acute left-sided flank pain. Patient will follow-up after workup is complete. - Urinalysis, Routine w reflex microscopic - Comprehensive Metabolic Panel (CMET) - CBC w/Diff - US Renal; Future  2. Generalized anxiety disorder Symptoms responsive to therapy. Patient taking medication as needed. Refills provided. - clonazePAM (KLONOPIN) 0.5 MG tablet; 0.25mg  in AM PRN, 0.5mg  QHS PRN  Dispense: 45 tablet; Refill: 0  Colbert Curenton [facility] 03/01/2015 8:27 AM

## 2015-03-02 LAB — CBC WITH DIFFERENTIAL/PLATELET
BASOS: 0 %
Basophils Absolute: 0 10*3/uL (ref 0.0–0.2)
EOS (ABSOLUTE): 0.1 10*3/uL (ref 0.0–0.4)
Eos: 2 %
Hematocrit: 46 % (ref 37.5–51.0)
Hemoglobin: 15.8 g/dL (ref 12.6–17.7)
IMMATURE GRANS (ABS): 0 10*3/uL (ref 0.0–0.1)
Immature Granulocytes: 0 %
Lymphocytes Absolute: 1.8 10*3/uL (ref 0.7–3.1)
Lymphs: 34 %
MCH: 28.7 pg (ref 26.6–33.0)
MCHC: 34.3 g/dL (ref 31.5–35.7)
MCV: 84 fL (ref 79–97)
MONOS ABS: 0.5 10*3/uL (ref 0.1–0.9)
Monocytes: 9 %
Neutrophils Absolute: 2.9 10*3/uL (ref 1.4–7.0)
Neutrophils: 55 %
Platelets: 238 10*3/uL (ref 150–379)
RBC: 5.51 x10E6/uL (ref 4.14–5.80)
RDW: 13.5 % (ref 12.3–15.4)
WBC: 5.4 10*3/uL (ref 3.4–10.8)

## 2015-03-02 LAB — COMPREHENSIVE METABOLIC PANEL
A/G RATIO: 2 (ref 1.1–2.5)
ALBUMIN: 4.1 g/dL (ref 3.5–5.5)
ALK PHOS: 42 IU/L (ref 39–117)
ALT: 32 IU/L (ref 0–44)
AST: 19 IU/L (ref 0–40)
BUN/Creatinine Ratio: 15 (ref 9–20)
BUN: 13 mg/dL (ref 6–24)
CALCIUM: 8.8 mg/dL (ref 8.7–10.2)
CO2: 21 mmol/L (ref 18–29)
CREATININE: 0.89 mg/dL (ref 0.76–1.27)
Chloride: 101 mmol/L (ref 97–108)
GFR calc Af Amer: 124 mL/min/{1.73_m2} (ref >59)
GFR, EST NON AFRICAN AMERICAN: 107 mL/min/{1.73_m2} (ref >59)
GLOBULIN, TOTAL: 2.1 g/dL (ref 1.5–4.5)
GLUCOSE: 105 mg/dL — AB (ref 65–99)
Potassium: 4.1 mmol/L (ref 3.5–5.2)
Sodium: 141 mmol/L (ref 134–144)
Total Protein: 6.2 g/dL (ref 6.0–8.5)

## 2015-03-02 LAB — URINALYSIS, ROUTINE W REFLEX MICROSCOPIC
BILIRUBIN UA: NEGATIVE
Glucose, UA: NEGATIVE
Ketones, UA: NEGATIVE
LEUKOCYTES UA: NEGATIVE
Nitrite, UA: NEGATIVE
PH UA: 5.5 (ref 5.0–7.5)
Protein, UA: NEGATIVE
RBC, UA: NEGATIVE
SPEC GRAV UA: 1.026 (ref 1.005–1.030)
UUROB: 0.2 mg/dL (ref 0.2–1.0)

## 2015-03-11 ENCOUNTER — Ambulatory Visit
Admission: RE | Admit: 2015-03-11 | Discharge: 2015-03-11 | Disposition: A | Discharge status: Home / Self Care | Payer: 59 | Source: Ambulatory Visit | Attending: Family Medicine | Admitting: Family Medicine

## 2015-03-11 DIAGNOSIS — R109 Unspecified abdominal pain: Secondary | ICD-10-CM

## 2015-03-11 DIAGNOSIS — R1012 Left upper quadrant pain: Secondary | ICD-10-CM | POA: Insufficient documentation

## 2015-03-18 ENCOUNTER — Other Ambulatory Visit: Payer: Self-pay | Admitting: Cardiovascular Disease

## 2015-04-01 ENCOUNTER — Ambulatory Visit: Payer: 59 | Admitting: Family Medicine

## 2015-06-03 ENCOUNTER — Encounter: Payer: Self-pay | Admitting: Pain Medicine

## 2015-06-03 ENCOUNTER — Ambulatory Visit: Payer: 59 | Attending: Pain Medicine | Admitting: Pain Medicine

## 2015-06-03 VITALS — BP 149/86 | HR 99 | Temp 99.0°F | Resp 18 | Ht 74.0 in | Wt 280.0 lb

## 2015-06-03 DIAGNOSIS — M47896 Other spondylosis, lumbar region: Secondary | ICD-10-CM | POA: Diagnosis not present

## 2015-06-03 DIAGNOSIS — M47816 Spondylosis without myelopathy or radiculopathy, lumbar region: Secondary | ICD-10-CM | POA: Insufficient documentation

## 2015-06-03 DIAGNOSIS — Z87891 Personal history of nicotine dependence: Secondary | ICD-10-CM | POA: Insufficient documentation

## 2015-06-03 DIAGNOSIS — M51369 Other intervertebral disc degeneration, lumbar region without mention of lumbar back pain or lower extremity pain: Secondary | ICD-10-CM | POA: Insufficient documentation

## 2015-06-03 DIAGNOSIS — E559 Vitamin D deficiency, unspecified: Secondary | ICD-10-CM | POA: Insufficient documentation

## 2015-06-03 DIAGNOSIS — F1120 Opioid dependence, uncomplicated: Secondary | ICD-10-CM | POA: Diagnosis not present

## 2015-06-03 DIAGNOSIS — F1190 Opioid use, unspecified, uncomplicated: Secondary | ICD-10-CM | POA: Diagnosis not present

## 2015-06-03 DIAGNOSIS — E039 Hypothyroidism, unspecified: Secondary | ICD-10-CM | POA: Diagnosis not present

## 2015-06-03 DIAGNOSIS — G4700 Insomnia, unspecified: Secondary | ICD-10-CM | POA: Insufficient documentation

## 2015-06-03 DIAGNOSIS — R631 Polydipsia: Secondary | ICD-10-CM | POA: Insufficient documentation

## 2015-06-03 DIAGNOSIS — M2428 Disorder of ligament, vertebrae: Secondary | ICD-10-CM | POA: Insufficient documentation

## 2015-06-03 DIAGNOSIS — M545 Low back pain, unspecified: Secondary | ICD-10-CM | POA: Insufficient documentation

## 2015-06-03 DIAGNOSIS — G8929 Other chronic pain: Secondary | ICD-10-CM | POA: Insufficient documentation

## 2015-06-03 DIAGNOSIS — Z6838 Body mass index (BMI) 38.0-38.9, adult: Secondary | ICD-10-CM

## 2015-06-03 DIAGNOSIS — I10 Essential (primary) hypertension: Secondary | ICD-10-CM | POA: Insufficient documentation

## 2015-06-03 DIAGNOSIS — F419 Anxiety disorder, unspecified: Secondary | ICD-10-CM | POA: Diagnosis not present

## 2015-06-03 DIAGNOSIS — M169 Osteoarthritis of hip, unspecified: Secondary | ICD-10-CM | POA: Insufficient documentation

## 2015-06-03 DIAGNOSIS — Z79891 Long term (current) use of opiate analgesic: Secondary | ICD-10-CM | POA: Insufficient documentation

## 2015-06-03 DIAGNOSIS — M549 Dorsalgia, unspecified: Secondary | ICD-10-CM | POA: Diagnosis present

## 2015-06-03 DIAGNOSIS — M5126 Other intervertebral disc displacement, lumbar region: Secondary | ICD-10-CM | POA: Insufficient documentation

## 2015-06-03 DIAGNOSIS — R252 Cramp and spasm: Secondary | ICD-10-CM | POA: Insufficient documentation

## 2015-06-03 DIAGNOSIS — M5136 Other intervertebral disc degeneration, lumbar region: Secondary | ICD-10-CM | POA: Insufficient documentation

## 2015-06-03 DIAGNOSIS — G4730 Sleep apnea, unspecified: Secondary | ICD-10-CM | POA: Insufficient documentation

## 2015-06-03 DIAGNOSIS — Z5181 Encounter for therapeutic drug level monitoring: Secondary | ICD-10-CM | POA: Insufficient documentation

## 2015-06-03 DIAGNOSIS — M519 Unspecified thoracic, thoracolumbar and lumbosacral intervertebral disc disorder: Secondary | ICD-10-CM | POA: Insufficient documentation

## 2015-06-03 DIAGNOSIS — M4600 Spinal enthesopathy, site unspecified: Secondary | ICD-10-CM | POA: Insufficient documentation

## 2015-06-03 DIAGNOSIS — G4733 Obstructive sleep apnea (adult) (pediatric): Secondary | ICD-10-CM | POA: Insufficient documentation

## 2015-06-03 HISTORY — DX: Other chronic pain: G89.29

## 2015-06-03 HISTORY — DX: Dorsalgia, unspecified: M54.9

## 2015-06-03 MED ORDER — TRAMADOL HCL 50 MG PO TABS: 100.0000 mg | ORAL_TABLET | Freq: Four times a day (QID) | ORAL | Status: DC | PRN

## 2015-06-03 NOTE — Progress Notes (Signed)
Patient's Name: Dean Ford MRN: [id number] DOB: 1976/07/11 DOS: 06/03/2015  Primary Reason(s) for Visit: Evaluation of uncontrolled established, chronic problem. CC: Back Pain   HPI:   Dean Ford is a 39 y.o. year old, male patient, who returns today as an established patient. 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[place]); Anxiety disorder; Dehydration symptoms; Acute left flank pain; Dyslipidemia; Encounter for therapeutic drug level monitoring; Long term current use of opiate analgesic; Uncomplicated opioid dependence ([place]); Opiate use; Lumbar spondylosis; Facet syndrome, lumbar; Acute low back pain; Chronic low back pain; Back pain, chronic; Insomnia, persistent; BP (high blood pressure); Adult hypothyroidism; Intermittent muscle cramps; Always thirsty; Avitaminosis D; Lumbar spine pain; Right hip pain; Hip arthrosis; Obesity; DDD (degenerative disc disease), lumbar; L4-L5 disc bulge; Ligamentum flavum hypertrophy (HCC); and Bulging lumbar disc on his problem list.. His primarily concern today is the Back Pain   the patient was doing well on his medications until he jumped out of a tree stand and began to experience increased low back pain. He tried to address this conservatively by going to a chiropractor. His pain has not gotten any better and therefore he would like to go ahead and have an interventional treatment. His low back pain seems to respond well to right lumbar facet blocks and an SI joint injection which normally use an 100-75% relief of the pain. His last treatment was a radiofrequency that was done on 03/20/2014.  Pharmacotherapy Review: Side-effects or Adverse reactions: None reported. Effectiveness: Described as relatively effective, allowing for increase in activities of daily living (ADL). Onset of action: Within expected pharmacological parameters. Duration of action: Within normal limits for medication. Peak effect: Timing and results  are as within normal expected parameters. [place] PMP: Compliant with practice rules and regulations. DST: Compliant with practice rules and regulations. Lab work: No new labs ordered by our practice. Treatment compliance: Compliant. Substance Use Disorder (SUD) Risk Level: Low Planned course of action: Continue therapy as is.  Allergies: Dean Ford is allergic to borax.  Meds: The patient has a current medication list which includes the following prescription(s): fludrocortisone, hydrochlorothiazide, levothyroxine, losartan, potassium chloride, testosterone cypionate, tramadol, clonazepam, and hydrocortisone. Requested Prescriptions   Signed Prescriptions Disp Refills  . traMADol (ULTRAM) 50 MG tablet 240 tablet 2    Sig: Take 2 tablets (100 mg total) by mouth every 6 (six) hours as needed.    ROS: Constitutional: Afebrile, no chills, well hydrated and well nourished Gastrointestinal: negative Musculoskeletal:negative Neurological: negative Behavioral/Psych: negative  PFSH: Medical:  Dean Ford  has a past medical history of Hypertension; Thyroid disease; Hypothyroid; Anxiety; Sleep apnea; Arthritis; and Hypothyroid. Family: family history includes Heart attack (age of onset: 22) in his father; Hypertension in his father. Surgical:  has past surgical history that includes Elbow surgery. Tobacco:  reports that he has quit smoking. He has quit using smokeless tobacco. Alcohol:  reports that he does not drink alcohol. Drug:  reports that he does not use illicit drugs.  Physical Exam: Vitals:  Today's Vitals   06/03/15 1040 06/03/15 1043  BP: 149/86   Pulse: 99   Temp: 99 F (37.2 C)   TempSrc: Oral   Resp: 18   Height: 6\' 2"  (1.88 m)   Weight: 280 lb (127.007 kg)   SpO2: 99%   PainSc:  4   Calculated BMI: Body mass index is 35.93 kg/(m^2). General appearance: alert, cooperative, appears stated age, moderate distress and moderately obese Eyes: conjunctivae/corneas clear.  PERRL, EOM's intact.  Fundi benign. Lungs: No evidence respiratory distress, no audible rales or ronchi and no use of accessory muscles of respiration Neck: no adenopathy, no carotid bruit, no JVD, supple, symmetrical, trachea midline and thyroid not enlarged, symmetric, no tenderness/mass/nodules Back: symmetric, no curvature. ROM normal. No CVA tenderness. Extremities: extremities normal, atraumatic, no cyanosis or edema Pulses: 2+ and symmetric Skin: Skin color, texture, turgor normal. No rashes or lesions Neurologic: Gait: Antalgic    Assessment: Encounter Diagnosis:  Primary Diagnosis: Acute low back pain [M54.5]  Plan: Dean Ford was seen today for back pain.  Diagnoses and all orders for this visit:  Acute low back pain -     LUMBAR FACET(MEDIAL BRANCH NERVE BLOCK) MBNB; Future -     SACROILIAC JOINT INJECTINS; Future  Chronic low back pain -     traMADol (ULTRAM) 50 MG tablet; Take 2 tablets (100 mg total) by mouth every 6 (six) hours as needed.  Facet syndrome, lumbar  Spondylosis of lumbar region without myelopathy or radiculopathy  Opiate use  Uncomplicated opioid dependence ([place])  Long term current use of opiate analgesic -     Drugs of abuse screen w/o alc, rtn urine-sln; Future  Encounter for therapeutic drug level monitoring     Patient Instructions   GENERAL RISKS AND COMPLICATIONS  What are the risk, side effects and possible complications? Generally speaking, most procedures are safe.  However, with any procedure there are risks, side effects, and the possibility of complications.  The risks and complications are dependent upon the sites that are lesioned, or the type of nerve block to be performed.  The closer the procedure is to the spine, the more serious the risks are.  Great care is taken when placing the radio frequency needles, block needles or lesioning probes, but sometimes complications can occur. 1. Infection: Any time there is an injection  through the skin, there is a risk of infection.  This is why sterile conditions are used for these blocks.  There are four possible types of infection. 1. Localized skin infection. 2. Central Nervous System Infection-This can be in the form of Meningitis, which can be deadly. 3. Epidural Infections-This can be in the form of an epidural abscess, which can cause pressure inside of the spine, causing compression of the spinal cord with subsequent paralysis. This would require an emergency surgery to decompress, and there are no guarantees that the patient would recover from the paralysis. 4. Discitis-This is an infection of the intervertebral discs.  It occurs in about 1% of discography procedures.  It is difficult to treat and it may lead to surgery.        2. Pain: the needles have to go through skin and soft tissues, will cause soreness.       3. Damage to internal structures:  The nerves to be lesioned may be near blood vessels or    other nerves which can be potentially damaged.       4. Bleeding: Bleeding is more common if the patient is taking blood thinners such as  aspirin, Coumadin, Ticiid, Plavix, etc., or if he/she have some genetic predisposition  such as hemophilia. Bleeding into the spinal canal can cause compression of the spinal  cord with subsequent paralysis.  This would require an emergency surgery to  decompress and there are no guarantees that the patient would recover from the  paralysis.       5. Pneumothorax:  Puncturing of a lung is a possibility, every time a needle  is introduced in  the area of the chest or upper back.  Pneumothorax refers to free air around the  collapsed lung(s), inside of the thoracic cavity (chest cavity).  Another two possible  complications related to a similar event would include: Hemothorax and Chylothorax.   These are variations of the Pneumothorax, where instead of air around the collapsed  lung(s), you may have blood or chyle, respectively.        6. Spinal headaches: They may occur with any procedures in the area of the spine.       7. Persistent CSF (Cerebro-Spinal Fluid) leakage: This is a rare problem, but may occur  with prolonged intrathecal or epidural catheters either due to the formation of a fistulous  track or a dural tear.       8. Nerve damage: By working so close to the spinal cord, there is always a possibility of  nerve damage, which could be as serious as a permanent spinal cord injury with  paralysis.       9. Death:  Although rare, severe deadly allergic reactions known as "Anaphylactic  reaction" can occur to any of the medications used.      10. Worsening of the symptoms:  We can always make thing worse.  What are the chances of something like this happening? Chances of any of this occuring are extremely low.  By statistics, you have more of a chance of getting killed in a motor vehicle accident: while driving to the hospital than any of the above occurring .  Nevertheless, you should be aware that they are possibilities.  In general, it is similar to taking a shower.  Everybody knows that you can slip, hit your head and get killed.  Does that mean that you should not shower again?  Nevertheless always keep in mind that statistics do not mean anything if you happen to be on the wrong side of them.  Even if a procedure has a 1 (one) in a 1,000,000 (million) chance of going wrong, it you happen to be that one..Also, keep in mind that by statistics, you have more of a chance of having something go wrong when taking medications.  Who should not have this procedure? If you are on a blood thinning medication (e.g. Coumadin, Plavix, see list of "Blood Thinners"), or if you have an active infection going on, you should not have the procedure.  If you are taking any blood thinners, please inform your physician.  How should I prepare for this procedure?  Do not eat or drink anything at least six hours prior to the procedure.  Bring  a driver with you .  It cannot be a taxi.  Come accompanied by an adult that can drive you back, and that is strong enough to help you if your legs get weak or numb from the local anesthetic.  Take all of your medicines the morning of the procedure with just enough water to swallow them.  If you have diabetes, make sure that you are scheduled to have your procedure done first thing in the morning, whenever possible.  If you have diabetes, take only half of your insulin dose and notify our nurse that you have done so as soon as you arrive at the clinic.  If you are diabetic, but only take blood sugar pills (oral hypoglycemic), then do not take them on the morning of your procedure.  You may take them after you have had the procedure.  Do  not take aspirin or any aspirin-containing medications, at least eleven (11) days prior to the procedure.  They may prolong bleeding.  Wear loose fitting clothing that may be easy to take off and that you would not mind if it got stained with Betadine or blood.  Do not wear any jewelry or perfume  Remove any nail coloring.  It will interfere with some of our monitoring equipment.  NOTE: Remember that this is not meant to be interpreted as a complete list of all possible complications.  Unforeseen problems may occur.  BLOOD THINNERS The following drugs contain aspirin or other products, which can cause increased bleeding during surgery and should not be taken for 2 weeks prior to and 1 week after surgery.  If you should need take something for relief of minor pain, you may take acetaminophen which is found in Tylenol,m Datril, Anacin-3 and Panadol. It is not blood thinner. The products listed below are.  Do not take any of the products listed below in addition to any listed on your instruction sheet.  A.P.C or A.P.C with Codeine Codeine Phosphate Capsules #3 Ibuprofen Ridaura  ABC compound Congesprin Imuran rimadil  Advil Cope Indocin Robaxisal   Alka-Seltzer Effervescent Pain Reliever and Antacid Coricidin or Coricidin-D  Indomethacin Rufen  Alka-Seltzer plus Cold Medicine Cosprin Ketoprofen S-A-C Tablets  Anacin Analgesic Tablets or Capsules Coumadin Korlgesic Salflex  Anacin Extra Strength Analgesic tablets or capsules CP-2 Tablets Lanoril Salicylate  Anaprox Cuprimine Capsules Levenox Salocol  Anexsia-D Dalteparin Magan Salsalate  Anodynos Darvon compound Magnesium Salicylate Sine-off  Ansaid Dasin Capsules Magsal Sodium Salicylate  Anturane Depen Capsules Marnal Soma  APF Arthritis pain formula Dewitt's Pills Measurin Stanback  Argesic Dia-Gesic Meclofenamic Sulfinpyrazone  Arthritis Bayer Timed Release Aspirin Diclofenac Meclomen Sulindac  Arthritis pain formula Anacin Dicumarol Medipren Supac  Analgesic (Safety coated) Arthralgen Diffunasal Mefanamic Suprofen  Arthritis Strength Bufferin Dihydrocodeine Mepro Compound Suprol  Arthropan liquid Dopirydamole Methcarbomol with Aspirin Synalgos  ASA tablets/Enseals Disalcid Micrainin Tagament  Ascriptin Doan's Midol Talwin  Ascriptin A/D Dolene Mobidin Tanderil  Ascriptin Extra Strength Dolobid Moblgesic Ticlid  Ascriptin with Codeine Doloprin or Doloprin with Codeine Momentum Tolectin  Asperbuf Duoprin Mono-gesic Trendar  Aspergum Duradyne Motrin or Motrin IB Triminicin  Aspirin plain, buffered or enteric coated Durasal Myochrisine Trigesic  Aspirin Suppositories Easprin Nalfon Trillsate  Aspirin with Codeine Ecotrin Regular or Extra Strength Naprosyn Uracel  Atromid-S Efficin Naproxen Ursinus  Auranofin Capsules Elmiron Neocylate Vanquish  Axotal Emagrin Norgesic Verin  Azathioprine Empirin or Empirin with Codeine Normiflo Vitamin E  Azolid Emprazil Nuprin Voltaren  Bayer Aspirin plain, buffered or children's or timed BC Tablets or powders Encaprin Orgaran Warfarin Sodium  Buff-a-Comp Enoxaparin Orudis Zorpin  Buff-a-Comp with Codeine Equegesic Os-Cal-Gesic   Buffaprin  Excedrin plain, buffered or Extra Strength Oxalid   Bufferin Arthritis Strength Feldene Oxphenbutazone   Bufferin plain or Extra Strength Feldene Capsules Oxycodone with Aspirin   Bufferin with Codeine Fenoprofen Fenoprofen Pabalate or Pabalate-SF   Buffets II Flogesic Panagesic   Buffinol plain or Extra Strength Florinal or Florinal with Codeine Panwarfarin   Buf-Tabs Flurbiprofen Penicillamine   Butalbital Compound Four-way cold tablets Penicillin   Butazolidin Fragmin Pepto-Bismol   Carbenicillin Geminisyn Percodan   Carna Arthritis Reliever Geopen Persantine   Carprofen Gold's salt Persistin   Chloramphenicol Goody's Phenylbutazone   Chloromycetin Haltrain Piroxlcam   Clmetidine heparin Plaquenil   Cllnoril Hyco-pap Ponstel   Clofibrate Hydroxy chloroquine Propoxyphen         Before stopping any  of these medications, be sure to consult the physician who ordered them.  Some, such as Coumadin (Warfarin) are ordered to prevent or treat serious conditions such as "deep thrombosis", "pumonary embolisms", and other heart problems.  The amount of time that you may need off of the medication may also vary with the medication and the reason for which you were taking it.  If you are taking any of these medications, please make sure you notify your pain physician before you undergo any procedures.         Sacroiliac (SI) Joint Injection Patient Information  Description: The sacroiliac joint connects the scrum (very low back and tailbone) to the ilium (a pelvic bone which also forms half of the hip joint).  Normally this joint experiences very little motion.  When this joint becomes inflamed or unstable low back and or hip and pelvis pain may result.  Injection of this joint with local anesthetics (numbing medicines) and steroids can provide diagnostic information and reduce pain.  This injection is performed with the aid of x-ray guidance into the tailbone area while you are lying on your  stomach.   You may experience an electrical sensation down the leg while this is being done.  You may also experience numbness.  We also may ask if we are reproducing your normal pain during the injection.  Conditions which may be treated SI injection:   Low back, buttock, hip or leg pain  Preparation for the Injection:  1. Do not eat any solid food or dairy products within 6 hours of your appointment.  2. You may drink clear liquids up to 2 hours before appointment.  Clear liquids include water, black coffee, juice or soda.  No milk or cream please. 3. You may take your regular medications, including pain medications with a sip of water before your appointment.  Diabetics should hold regular insulin (if take separately) and take 1/2 normal NPH dose the morning of the procedure.  Carry some sugar containing items with you to your appointment. 4. A driver must accompany you and be prepared to drive you home after your procedure. 5. Bring all of your current medications with you. 6. An IV may be inserted and sedation may be given at the discretion of the physician. 7. A blood pressure cuff, EKG and other monitors will often be applied during the procedure.  Some patients may need to have extra oxygen administered for a short period.  8. You will be asked to provide medical information, including your allergies, prior to the procedure.  We must know immediately if you are taking blood thinners (like Coumadin/Warfarin) or if you are allergic to IV iodine contrast (dye).  We must know if you could possible be pregnant.  Possible side effects:   Bleeding from needle site  Infection (rare, may require surgery)  Nerve injury (rare)  Numbness & tingling (temporary)  A brief convulsion or seizure  Light-headedness (temporary)  Pain at injection site (several days)  Decreased blood pressure (temporary)  Weakness in the leg (temporary)   Call if you experience:   New onset weakness or  numbness of an extremity below the injection site that last more than 8 hours.  Hives or difficulty breathing ( go to the emergency room)  Inflammation or drainage at the injection site  Any new symptoms which are concerning to you  Please note:  Although the local anesthetic injected can often make your back/ hip/ buttock/ leg feel good for several hours  after the injections, the pain will likely return.  It takes 3-7 days for steroids to work in the sacroiliac area.  You may not notice any pain relief for at least that one week.  If effective, we will often do a series of three injections spaced 3-6 weeks apart to maximally decrease your pain.  After the initial series, we generally will wait some months before a repeat injection of the same type.  If you have any questions, please call [phone] [facility]  Facet Blocks Patient Information  Description: The facets are joints in the spine between the vertebrae.  Like any joints in the body, facets can become irritated and painful.  Arthritis can also effect the facets.  By injecting steroids and local anesthetic in and around these joints, we can temporarily block the nerve supply to them.  Steroids act directly on irritated nerves and tissues to reduce selling and inflammation which often leads to decreased pain.  Facet blocks may be done anywhere along the spine from the neck to the low back depending upon the location of your pain.   After numbing the skin with local anesthetic (like Novocaine), a small needle is passed onto the facet joints under x-ray guidance.  You may experience a sensation of pressure while this is being done.  The entire block usually lasts about 15-25 minutes.   Conditions which may be treated by facet blocks:   Low back/buttock pain  Neck/shoulder pain  Certain types of headaches  Preparation for the injection:  10. Do not eat any solid food or dairy products  within 6 hours of your appointment. 11. You may drink clear liquid up to 2 hours before appointment.  Clear liquids include water, black coffee, juice or soda.  No milk or cream please. 12. You may take your regular medication, including pain medications, with a sip of water before your appointment.  Diabetics should hold regular insulin (if taken separately) and take 1/2 normal NPH dose the morning of the procedure.  Carry some sugar containing items with you to your appointment. 13. A driver must accompany you and be prepared to drive you home after your procedure. 34. Bring all your current medications with you. 15. An IV may be inserted and sedation may be given at the discretion of the physician. 16. A blood pressure cuff, EKG and other monitors will often be applied during the procedure.  Some patients may need to have extra oxygen administered for a short period. 47. You will be asked to provide medical information, including your allergies and medications, prior to the procedure.  We must know immediately if you are taking blood thinners (like Coumadin/Warfarin) or if you are allergic to IV iodine contrast (dye).  We must know if you could possible be pregnant.  Possible side-effects:   Bleeding from needle site  Infection (rare, may require surgery)  Nerve injury (rare)  Numbness & tingling (temporary)  Difficulty urinating (rare, temporary)  Spinal headache (a headache worse with upright posture)  Light-headedness (temporary)  Pain at injection site (serveral days)  Decreased blood pressure (rare, temporary)  Weakness in arm/leg (temporary)  Pressure sensation in back/neck (temporary)   Call if you experience:   Fever/chills associated with headache or increased back/neck pain  Headache worsened by an upright position  New onset, weakness or numbness of an extremity below the injection site  Hives or difficulty breathing (go to the emergency room)  Inflammation  or drainage at  the injection site(s)  Severe back/neck pain greater than usual  New symptoms which are concerning to you  Please note:  Although the local anesthetic injected can often make your back or neck feel good for several hours after the injection, the pain will likely return. It takes 3-7 days for steroids to work.  You may not notice any pain relief for at least one week.  If effective, we will often do a series of 2-3 injections spaced 3-6 weeks apart to maximally decrease your pain.  After the initial series, you may be a candidate for a more permanent nerve block of the facets.  If you have any questions, please call #[phone]) [facility]   Medications discontinued today:  Medications Discontinued During This Encounter  Medication Reason  . traMADol (ULTRAM) 50 MG tablet Reorder   Medications administered today:  Dean Ford had no medications administered during this visit.  Primary Care Physician: Keith Rake, MD Location: [facility] Outpatient Pain Management Facility Note by: Zafir Schauer A. Dossie Arbour, M.D, DABA, DABAPM, DABPM, DABIPP, FIPP

## 2015-06-03 NOTE — Progress Notes (Signed)
Safety precautions to be maintained throughout the outpatient stay will include: orient to surroundings, keep bed in low position, maintain call bell within reach at all times, provide assistance with transfer out of bed and ambulation.  

## 2015-06-03 NOTE — Patient Instructions (Signed)
GENERAL RISKS AND COMPLICATIONS  What are the risk, side effects and possible complications? Generally speaking, most procedures are safe.  However, with any procedure there are risks, side effects, and the possibility of complications.  The risks and complications are dependent upon the sites that are lesioned, or the type of nerve block to be performed.  The closer the procedure is to the spine, the more serious the risks are.  Great care is taken when placing the radio frequency needles, block needles or lesioning probes, but sometimes complications can occur. 1. Infection: Any time there is an injection through the skin, there is a risk of infection.  This is why sterile conditions are used for these blocks.  There are four possible types of infection. 1. Localized skin infection. 2. Central Nervous System Infection-This can be in the form of Meningitis, which can be deadly. 3. Epidural Infections-This can be in the form of an epidural abscess, which can cause pressure inside of the spine, causing compression of the spinal cord with subsequent paralysis. This would require an emergency surgery to decompress, and there are no guarantees that the patient would recover from the paralysis. 4. Discitis-This is an infection of the intervertebral discs.  It occurs in about 1% of discography procedures.  It is difficult to treat and it may lead to surgery.        2. Pain: the needles have to go through skin and soft tissues, will cause soreness.       3. Damage to internal structures:  The nerves to be lesioned may be near blood vessels or    other nerves which can be potentially damaged.       4. Bleeding: Bleeding is more common if the patient is taking blood thinners such as  aspirin, Coumadin, Ticiid, Plavix, etc., or if he/she have some genetic predisposition  such as hemophilia. Bleeding into the spinal canal can cause compression of the spinal  cord with subsequent paralysis.  This would require an  emergency surgery to  decompress and there are no guarantees that the patient would recover from the  paralysis.       5. Pneumothorax:  Puncturing of a lung is a possibility, every time a needle is introduced in  the area of the chest or upper back.  Pneumothorax refers to free air around the  collapsed lung(s), inside of the thoracic cavity (chest cavity).  Another two possible  complications related to a similar event would include: Hemothorax and Chylothorax.   These are variations of the Pneumothorax, where instead of air around the collapsed  lung(s), you may have blood or chyle, respectively.       6. Spinal headaches: They may occur with any procedures in the area of the spine.       7. Persistent CSF (Cerebro-Spinal Fluid) leakage: This is a rare problem, but may occur  with prolonged intrathecal or epidural catheters either due to the formation of a fistulous  track or a dural tear.       8. Nerve damage: By working so close to the spinal cord, there is always a possibility of  nerve damage, which could be as serious as a permanent spinal cord injury with  paralysis.       9. Death:  Although rare, severe deadly allergic reactions known as "Anaphylactic  reaction" can occur to any of the medications used.      10. Worsening of the symptoms:  We can always make thing worse.    What are the chances of something like this happening? Chances of any of this occuring are extremely low.  By statistics, you have more of a chance of getting killed in a motor vehicle accident: while driving to the hospital than any of the above occurring .  Nevertheless, you should be aware that they are possibilities.  In general, it is similar to taking a shower.  Everybody knows that you can slip, hit your head and get killed.  Does that mean that you should not shower again?  Nevertheless always keep in mind that statistics do not mean anything if you happen to be on the wrong side of them.  Even if a procedure has a 1  (one) in a 1,000,000 (million) chance of going wrong, it you happen to be that one..Also, keep in mind that by statistics, you have more of a chance of having something go wrong when taking medications.  Who should not have this procedure? If you are on a blood thinning medication (e.g. Coumadin, Plavix, see list of "Blood Thinners"), or if you have an active infection going on, you should not have the procedure.  If you are taking any blood thinners, please inform your physician.  How should I prepare for this procedure?  Do not eat or drink anything at least six hours prior to the procedure.  Bring a driver with you .  It cannot be a taxi.  Come accompanied by an adult that can drive you back, and that is strong enough to help you if your legs get weak or numb from the local anesthetic.  Take all of your medicines the morning of the procedure with just enough water to swallow them.  If you have diabetes, make sure that you are scheduled to have your procedure done first thing in the morning, whenever possible.  If you have diabetes, take only half of your insulin dose and notify our nurse that you have done so as soon as you arrive at the clinic.  If you are diabetic, but only take blood sugar pills (oral hypoglycemic), then do not take them on the morning of your procedure.  You may take them after you have had the procedure.  Do not take aspirin or any aspirin-containing medications, at least eleven (11) days prior to the procedure.  They may prolong bleeding.  Wear loose fitting clothing that may be easy to take off and that you would not mind if it got stained with Betadine or blood.  Do not wear any jewelry or perfume  Remove any nail coloring.  It will interfere with some of our monitoring equipment.  NOTE: Remember that this is not meant to be interpreted as a complete list of all possible complications.  Unforeseen problems may occur.  BLOOD THINNERS The following drugs  contain aspirin or other products, which can cause increased bleeding during surgery and should not be taken for 2 weeks prior to and 1 week after surgery.  If you should need take something for relief of minor pain, you may take acetaminophen which is found in Tylenol,m Datril, Anacin-3 and Panadol. It is not blood thinner. The products listed below are.  Do not take any of the products listed below in addition to any listed on your instruction sheet.  A.P.C or A.P.C with Codeine Codeine Phosphate Capsules #3 Ibuprofen Ridaura  ABC compound Congesprin Imuran rimadil  Advil Cope Indocin Robaxisal  Alka-Seltzer Effervescent Pain Reliever and Antacid Coricidin or Coricidin-D  Indomethacin Rufen    Alka-Seltzer plus Cold Medicine Cosprin Ketoprofen S-A-C Tablets  Anacin Analgesic Tablets or Capsules Coumadin Korlgesic Salflex  Anacin Extra Strength Analgesic tablets or capsules CP-2 Tablets Lanoril Salicylate  Anaprox Cuprimine Capsules Levenox Salocol  Anexsia-D Dalteparin Magan Salsalate  Anodynos Darvon compound Magnesium Salicylate Sine-off  Ansaid Dasin Capsules Magsal Sodium Salicylate  Anturane Depen Capsules Marnal Soma  APF Arthritis pain formula Dewitt's Pills Measurin Stanback  Argesic Dia-Gesic Meclofenamic Sulfinpyrazone  Arthritis Bayer Timed Release Aspirin Diclofenac Meclomen Sulindac  Arthritis pain formula Anacin Dicumarol Medipren Supac  Analgesic (Safety coated) Arthralgen Diffunasal Mefanamic Suprofen  Arthritis Strength Bufferin Dihydrocodeine Mepro Compound Suprol  Arthropan liquid Dopirydamole Methcarbomol with Aspirin Synalgos  ASA tablets/Enseals Disalcid Micrainin Tagament  Ascriptin Doan's Midol Talwin  Ascriptin A/D Dolene Mobidin Tanderil  Ascriptin Extra Strength Dolobid Moblgesic Ticlid  Ascriptin with Codeine Doloprin or Doloprin with Codeine Momentum Tolectin  Asperbuf Duoprin Mono-gesic Trendar  Aspergum Duradyne Motrin or Motrin IB Triminicin  Aspirin  plain, buffered or enteric coated Durasal Myochrisine Trigesic  Aspirin Suppositories Easprin Nalfon Trillsate  Aspirin with Codeine Ecotrin Regular or Extra Strength Naprosyn Uracel  Atromid-S Efficin Naproxen Ursinus  Auranofin Capsules Elmiron Neocylate Vanquish  Axotal Emagrin Norgesic Verin  Azathioprine Empirin or Empirin with Codeine Normiflo Vitamin E  Azolid Emprazil Nuprin Voltaren  Bayer Aspirin plain, buffered or children's or timed BC Tablets or powders Encaprin Orgaran Warfarin Sodium  Buff-a-Comp Enoxaparin Orudis Zorpin  Buff-a-Comp with Codeine Equegesic Os-Cal-Gesic   Buffaprin Excedrin plain, buffered or Extra Strength Oxalid   Bufferin Arthritis Strength Feldene Oxphenbutazone   Bufferin plain or Extra Strength Feldene Capsules Oxycodone with Aspirin   Bufferin with Codeine Fenoprofen Fenoprofen Pabalate or Pabalate-SF   Buffets II Flogesic Panagesic   Buffinol plain or Extra Strength Florinal or Florinal with Codeine Panwarfarin   Buf-Tabs Flurbiprofen Penicillamine   Butalbital Compound Four-way cold tablets Penicillin   Butazolidin Fragmin Pepto-Bismol   Carbenicillin Geminisyn Percodan   Carna Arthritis Reliever Geopen Persantine   Carprofen Gold's salt Persistin   Chloramphenicol Goody's Phenylbutazone   Chloromycetin Haltrain Piroxlcam   Clmetidine heparin Plaquenil   Cllnoril Hyco-pap Ponstel   Clofibrate Hydroxy chloroquine Propoxyphen         Before stopping any of these medications, be sure to consult the physician who ordered them.  Some, such as Coumadin (Warfarin) are ordered to prevent or treat serious conditions such as "deep thrombosis", "pumonary embolisms", and other heart problems.  The amount of time that you may need off of the medication may also vary with the medication and the reason for which you were taking it.  If you are taking any of these medications, please make sure you notify your pain physician before you undergo any  procedures.         Sacroiliac (SI) Joint Injection Patient Information  Description: The sacroiliac joint connects the scrum (very low back and tailbone) to the ilium (a pelvic bone which also forms half of the hip joint).  Normally this joint experiences very little motion.  When this joint becomes inflamed or unstable low back and or hip and pelvis pain may result.  Injection of this joint with local anesthetics (numbing medicines) and steroids can provide diagnostic information and reduce pain.  This injection is performed with the aid of x-ray guidance into the tailbone area while you are lying on your stomach.   You may experience an electrical sensation down the leg while this is being done.  You may also experience numbness.  We   also may ask if we are reproducing your normal pain during the injection.  Conditions which may be treated SI injection:   Low back, buttock, hip or leg pain  Preparation for the Injection:  1. Do not eat any solid food or dairy products within 6 hours of your appointment.  2. You may drink clear liquids up to 2 hours before appointment.  Clear liquids include water, black coffee, juice or soda.  No milk or cream please. 3. You may take your regular medications, including pain medications with a sip of water before your appointment.  Diabetics should hold regular insulin (if take separately) and take 1/2 normal NPH dose the morning of the procedure.  Carry some sugar containing items with you to your appointment. 4. A driver must accompany you and be prepared to drive you home after your procedure. 5. Bring all of your current medications with you. 6. An IV may be inserted and sedation may be given at the discretion of the physician. 7. A blood pressure cuff, EKG and other monitors will often be applied during the procedure.  Some patients may need to have extra oxygen administered for a short period.  8. You will be asked to provide medical information,  including your allergies, prior to the procedure.  We must know immediately if you are taking blood thinners (like Coumadin/Warfarin) or if you are allergic to IV iodine contrast (dye).  We must know if you could possible be pregnant.  Possible side effects:   Bleeding from needle site  Infection (rare, may require surgery)  Nerve injury (rare)  Numbness & tingling (temporary)  A brief convulsion or seizure  Light-headedness (temporary)  Pain at injection site (several days)  Decreased blood pressure (temporary)  Weakness in the leg (temporary)   Call if you experience:   New onset weakness or numbness of an extremity below the injection site that last more than 8 hours.  Hives or difficulty breathing ( go to the emergency room)  Inflammation or drainage at the injection site  Any new symptoms which are concerning to you  Please note:  Although the local anesthetic injected can often make your back/ hip/ buttock/ leg feel good for several hours after the injections, the pain will likely return.  It takes 3-7 days for steroids to work in the sacroiliac area.  You may not notice any pain relief for at least that one week.  If effective, we will often do a series of three injections spaced 3-6 weeks apart to maximally decrease your pain.  After the initial series, we generally will wait some months before a repeat injection of the same type.  If you have any questions, please call [phone] [facility]  Facet Blocks Patient Information  Description: The facets are joints in the spine between the vertebrae.  Like any joints in the body, facets can become irritated and painful.  Arthritis can also effect the facets.  By injecting steroids and local anesthetic in and around these joints, we can temporarily block the nerve supply to them.  Steroids act directly on irritated nerves and tissues to reduce selling and inflammation which often  leads to decreased pain.  Facet blocks may be done anywhere along the spine from the neck to the low back depending upon the location of your pain.   After numbing the skin with local anesthetic (like Novocaine), a small needle is passed onto the facet joints under x-ray guidance.  You  may experience a sensation of pressure while this is being done.  The entire block usually lasts about 15-25 minutes.   Conditions which may be treated by facet blocks:   Low back/buttock pain  Neck/shoulder pain  Certain types of headaches  Preparation for the injection:  10. Do not eat any solid food or dairy products within 6 hours of your appointment. 11. You may drink clear liquid up to 2 hours before appointment.  Clear liquids include water, black coffee, juice or soda.  No milk or cream please. 12. You may take your regular medication, including pain medications, with a sip of water before your appointment.  Diabetics should hold regular insulin (if taken separately) and take 1/2 normal NPH dose the morning of the procedure.  Carry some sugar containing items with you to your appointment. 13. A driver must accompany you and be prepared to drive you home after your procedure. 36. Bring all your current medications with you. 15. An IV may be inserted and sedation may be given at the discretion of the physician. 16. A blood pressure cuff, EKG and other monitors will often be applied during the procedure.  Some patients may need to have extra oxygen administered for a short period. 74. You will be asked to provide medical information, including your allergies and medications, prior to the procedure.  We must know immediately if you are taking blood thinners (like Coumadin/Warfarin) or if you are allergic to IV iodine contrast (dye).  We must know if you could possible be pregnant.  Possible side-effects:   Bleeding from needle site  Infection (rare, may require surgery)  Nerve injury  (rare)  Numbness & tingling (temporary)  Difficulty urinating (rare, temporary)  Spinal headache (a headache worse with upright posture)  Light-headedness (temporary)  Pain at injection site (serveral days)  Decreased blood pressure (rare, temporary)  Weakness in arm/leg (temporary)  Pressure sensation in back/neck (temporary)   Call if you experience:   Fever/chills associated with headache or increased back/neck pain  Headache worsened by an upright position  New onset, weakness or numbness of an extremity below the injection site  Hives or difficulty breathing (go to the emergency room)  Inflammation or drainage at the injection site(s)  Severe back/neck pain greater than usual  New symptoms which are concerning to you  Please note:  Although the local anesthetic injected can often make your back or neck feel good for several hours after the injection, the pain will likely return. It takes 3-7 days for steroids to work.  You may not notice any pain relief for at least one week.  If effective, we will often do a series of 2-3 injections spaced 3-6 weeks apart to maximally decrease your pain.  After the initial series, you may be a candidate for a more permanent nerve block of the facets.  If you have any questions, please call #[phone] [facility]

## 2015-06-11 ENCOUNTER — Ambulatory Visit: Payer: 59 | Attending: Pain Medicine | Admitting: Pain Medicine

## 2015-06-11 ENCOUNTER — Encounter: Payer: Self-pay | Admitting: Pain Medicine

## 2015-06-11 VITALS — BP 131/70 | HR 79 | Temp 98.2°F | Resp 18 | Ht 74.0 in | Wt 280.0 lb

## 2015-06-11 DIAGNOSIS — Z6835 Body mass index (BMI) 35.0-35.9, adult: Secondary | ICD-10-CM | POA: Insufficient documentation

## 2015-06-11 DIAGNOSIS — I10 Essential (primary) hypertension: Secondary | ICD-10-CM | POA: Diagnosis not present

## 2015-06-11 DIAGNOSIS — G4730 Sleep apnea, unspecified: Secondary | ICD-10-CM | POA: Diagnosis not present

## 2015-06-11 DIAGNOSIS — R109 Unspecified abdominal pain: Secondary | ICD-10-CM | POA: Diagnosis not present

## 2015-06-11 DIAGNOSIS — G8929 Other chronic pain: Secondary | ICD-10-CM | POA: Insufficient documentation

## 2015-06-11 DIAGNOSIS — E669 Obesity, unspecified: Secondary | ICD-10-CM | POA: Diagnosis not present

## 2015-06-11 DIAGNOSIS — E785 Hyperlipidemia, unspecified: Secondary | ICD-10-CM | POA: Insufficient documentation

## 2015-06-11 DIAGNOSIS — M2428 Disorder of ligament, vertebrae: Secondary | ICD-10-CM

## 2015-06-11 DIAGNOSIS — Z5181 Encounter for therapeutic drug level monitoring: Secondary | ICD-10-CM | POA: Insufficient documentation

## 2015-06-11 DIAGNOSIS — E039 Hypothyroidism, unspecified: Secondary | ICD-10-CM | POA: Insufficient documentation

## 2015-06-11 DIAGNOSIS — M47896 Other spondylosis, lumbar region: Secondary | ICD-10-CM | POA: Diagnosis not present

## 2015-06-11 DIAGNOSIS — M5136 Other intervertebral disc degeneration, lumbar region: Secondary | ICD-10-CM | POA: Insufficient documentation

## 2015-06-11 DIAGNOSIS — M47897 Other spondylosis, lumbosacral region: Secondary | ICD-10-CM | POA: Insufficient documentation

## 2015-06-11 DIAGNOSIS — M1610 Unilateral primary osteoarthritis, unspecified hip: Secondary | ICD-10-CM | POA: Diagnosis not present

## 2015-06-11 DIAGNOSIS — M25551 Pain in right hip: Secondary | ICD-10-CM | POA: Insufficient documentation

## 2015-06-11 DIAGNOSIS — M545 Low back pain: Secondary | ICD-10-CM | POA: Insufficient documentation

## 2015-06-11 DIAGNOSIS — M5417 Radiculopathy, lumbosacral region: Secondary | ICD-10-CM | POA: Insufficient documentation

## 2015-06-11 DIAGNOSIS — M47816 Spondylosis without myelopathy or radiculopathy, lumbar region: Secondary | ICD-10-CM | POA: Diagnosis not present

## 2015-06-11 DIAGNOSIS — Z79891 Long term (current) use of opiate analgesic: Secondary | ICD-10-CM | POA: Diagnosis not present

## 2015-06-11 DIAGNOSIS — M533 Sacrococcygeal disorders, not elsewhere classified: Secondary | ICD-10-CM | POA: Insufficient documentation

## 2015-06-11 DIAGNOSIS — M4600 Spinal enthesopathy, site unspecified: Secondary | ICD-10-CM

## 2015-06-11 MED ORDER — METHYLPREDNISOLONE ACETATE 80 MG/ML IJ SUSP
INTRAMUSCULAR | Status: AC
  Administered 2015-06-11: 09:00:00
  Filled 2015-06-11: qty 1

## 2015-06-11 MED ORDER — TRIAMCINOLONE ACETONIDE 40 MG/ML IJ SUSP: 40.0000 mg | Freq: Once | INTRAMUSCULAR | Status: DC

## 2015-06-11 MED ORDER — ROPIVACAINE HCL 2 MG/ML IJ SOLN: 9.0000 mL | Freq: Once | INTRAMUSCULAR | Status: DC

## 2015-06-11 MED ORDER — FENTANYL CITRATE (PF) 100 MCG/2ML IJ SOLN
INTRAMUSCULAR | Status: AC
  Administered 2015-06-11: 100 ug via INTRAVENOUS
  Filled 2015-06-11: qty 2

## 2015-06-11 MED ORDER — LIDOCAINE HCL (PF) 1 % IJ SOLN: 10.0000 mL | Freq: Once | INTRAMUSCULAR | Status: DC

## 2015-06-11 MED ORDER — MIDAZOLAM HCL 5 MG/5ML IJ SOLN: 5.0000 mg | Freq: Once | INTRAMUSCULAR | Status: DC

## 2015-06-11 MED ORDER — MIDAZOLAM HCL 5 MG/5ML IJ SOLN
INTRAMUSCULAR | Status: AC
  Filled 2015-06-11: qty 5

## 2015-06-11 MED ORDER — FENTANYL CITRATE (PF) 100 MCG/2ML IJ SOLN: 100.0000 ug | Freq: Once | INTRAMUSCULAR | Status: DC

## 2015-06-11 MED ORDER — LACTATED RINGERS IV SOLN: 1000.0000 mL | INTRAVENOUS | Status: AC

## 2015-06-11 MED ORDER — ROPIVACAINE HCL 2 MG/ML IJ SOLN
INTRAMUSCULAR | Status: AC
  Administered 2015-06-11: 09:00:00
  Filled 2015-06-11: qty 10

## 2015-06-11 MED ORDER — TRIAMCINOLONE ACETONIDE 40 MG/ML IJ SUSP
INTRAMUSCULAR | Status: AC
  Administered 2015-06-11: 09:00:00
  Filled 2015-06-11: qty 1

## 2015-06-11 MED ORDER — METHYLPREDNISOLONE ACETATE 80 MG/ML IJ SUSP: 80.0000 mg | Freq: Once | INTRAMUSCULAR | Status: DC

## 2015-06-11 NOTE — Patient Instructions (Signed)
Pain Management Discharge Instructions  General Discharge Instructions :  If you need to reach your doctor call: Monday-Friday 8:00 am - 4:00 pm at [phone] or toll free [phone].  After clinic hours [phone] to have operator reach doctor.  Bring all of your medication bottles to all your appointments in the pain clinic.  To cancel or reschedule your appointment with Pain Management please remember to call 24 hours in advance to avoid a fee.  Refer to the educational materials which you have been given on: General Risks, I had my Procedure. Discharge Instructions, Post Sedation.  Post Procedure Instructions:  The drugs you were given will stay in your system until tomorrow, so for the next 24 hours you should not drive, make any legal decisions or drink any alcoholic beverages.  You may eat anything you prefer, but it is better to start with liquids then soups and crackers, and gradually work up to solid foods.  Please notify your doctor immediately if you have any unusual bleeding, trouble breathing or pain that is not related to your normal pain.  Depending on the type of procedure that was done, some parts of your body may feel week and/or numb.  This usually clears up by tonight or the next day.  Walk with the use of an assistive device or accompanied by an adult for the 24 hours.  You may use ice on the affected area for the first 24 hours.  Put ice in a Ziploc bag and cover with a towel and place against area 15 minutes on 15 minutes off.  You may switch to heat after 24 hours.Facet Joint Block The facet joints connect the bones of the spine (vertebrae). They make it possible for you to bend, twist, and make other movements with your spine. They also prevent you from overbending, overtwisting, and making other excessive movements.  A facet joint block is a procedure where a numbing medicine (anesthetic) is injected into a facet joint. Often, a type of anti-inflammatory  medicine called a steroid is also injected. A facet joint block may be done for two reasons:   Diagnosis. A facet joint block may be done as a test to see whether neck or back pain is caused by a worn-down or infected facet joint. If the pain gets better after a facet joint block, it means the pain is probably coming from the facet joint. If the pain does not get better, it means the pain is probably not coming from the facet joint.   Therapy. A facet joint block may be done to relieve neck or back pain caused by a facet joint. A facet joint block is only done as a therapy if the pain does not improve with medicine, exercise programs, physical therapy, and other forms of pain management. LET YOUR HEALTH CARE PROVIDER KNOW ABOUT:   Any allergies you have.   All medicines you are taking, including vitamins, herbs, eyedrops, and over-the-counter medicines and creams.   Previous problems you or members of your family have had with the use of anesthetics.   Any blood disorders you have had.   Other health problems you have. RISKS AND COMPLICATIONS Generally, having a facet joint block is safe. However, as with any procedure, complications can occur. Possible complications associated with having a facet joint block include:   Bleeding.   Injury to a nerve near the injection site.   Pain at the injection site.   Weakness or numbness in areas controlled by nerves near   the injection site.   Infection.   Temporary fluid retention.   Allergic reaction to anesthetics or medicines used during the procedure. BEFORE THE PROCEDURE   Follow your health care provider's instructions if you are taking dietary supplements or medicines. You may need to stop taking them or reduce your dosage.   Do not take any new dietary supplements or medicines without asking your health care provider first.   Follow your health care provider's instructions about eating and drinking before the  procedure. You may need to stop eating and drinking several hours before the procedure.   Arrange to have an adult drive you home after the procedure. PROCEDURE  You may need to remove your clothing and dress in an open-back gown so that your health care provider can access your spine.   The procedure will be done while you are lying on an X-ray table. Most of the time you will be asked to lie on your stomach, but you may be asked to lie in a different position if an injection will be made in your neck.   Special machines will be used to monitor your oxygen levels, heart rate, and blood pressure.   If an injection will be made in your neck, an intravenous (IV) tube will be inserted into one of your veins. Fluids and medicine will flow directly into your body through the IV tube.   The area over the facet joint where the injection will be made will be cleaned with an antiseptic soap. The surrounding skin will be covered with sterile drapes.   An anesthetic will be applied to your skin to make the injection area numb. You may feel a temporary stinging or burning sensation.   A video X-ray machine will be used to locate the joint. A contrast dye may be injected into the facet joint area to help with locating the joint.   When the joint is located, an anesthetic medicine will be injected into the joint through the needle.   Your health care provider will ask you whether you feel pain relief. If you do feel relief, a steroid may be injected to provide pain relief for a longer period of time. If you do not feel relief or feel only partial relief, additional injections of an anesthetic may be made in other facet joints.   The needle will be removed, the skin will be cleansed, and bandages will be applied.  AFTER THE PROCEDURE   You will be observed for 15-30 minutes before being allowed to go home. Do not drive. Have an adult drive you or take a taxi or public transportation instead.    If you feel pain relief, the pain will return in several hours or days when the anesthetic wears off.   You may feel pain relief 2-14 days after the procedure. The amount of time this relief lasts varies from person to person.   It is normal to feel some tenderness over the injected area(s) for 2 days following the procedure.   If you have diabetes, you may have a temporary increase in blood sugar.   This information is not intended to replace advice given to you by your health care provider. Make sure you discuss any questions you have with your health care provider.   Document Released: 12/23/2006 Document Revised: 08/24/2014 Document Reviewed: 05/23/2012 [employer] Interactive Patient Education 2016 [employer]  

## 2015-06-11 NOTE — Progress Notes (Deleted)
Patient's Name: Dean Ford MRN: [id number] DOB: December 14, 1975 DOS: 06/11/2015  Primary Reason(s) for Visit: Interventional Pain Management Treatment. CC: Back Pain   Pre-Procedure Assessment: Dean Ford is a 39 y.o. year old, male patient, seen today for interventional treatment. 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[place]); Anxiety disorder; Dehydration symptoms; Acute left flank pain; Dyslipidemia; Encounter for therapeutic drug level monitoring; Long term current use of opiate analgesic; Uncomplicated opioid dependence ([place]); Opiate use; Lumbar spondylosis; Facet syndrome, lumbar; Acute low back pain; Chronic low back pain; Back pain, chronic; Insomnia, persistent; BP (high blood pressure); Adult hypothyroidism; Intermittent muscle cramps; Always thirsty; Avitaminosis D; Lumbar spine pain; Right hip pain; Hip arthrosis; Obesity; DDD (degenerative disc disease), lumbar; L4-L5 disc bulge; Ligamentum flavum hypertrophy (HCC); Bulging lumbar disc; Sleep apnea; and Chronic right sacroiliac joint pain on his problem list.. His primarily concern today is the Back Pain Verification of the correct person, correct site (including marking of site), and correct procedure were performed and confirmed by the patient. Today's Vitals   06/11/15 0830 06/11/15 0831  BP: 128/87   Pulse: 81   Temp: 98.2 F (36.8 C)   Resp: 18   Height: 6' 2"  (1.88 m)   Weight: 280 lb (127.007 kg)   SpO2: 98%   PainSc: 6  6   PainLoc: Back   Calculated BMI: Body mass index is 35.93 kg/(m^2). Allergies: He is allergic to borax.. 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 Under the influence of no  sedatives a written informed consent was obtained, after having provided information on the risks and possible complications. To fulfill our ethical and legal obligations, as recommended by the American Medical Association's Code of Ethics, we have provided information to the patient about our clinical impression; the nature and purpose of the treatment or procedure; the risks, benefits, and possible complications of the intervention; alternatives; the risk(s) and benefit(s) of the alternative treatment(s) or procedure(s); and the risk(s) and benefit(s) of doing nothing. The patient was provided information about the risks and possible complications associated with the procedure. In the case of spinal procedures these may include, but are not limited to, failure to achieve desired goals, infection, bleeding, organ or nerve damage, allergic reactions, paralysis, and death. In addition, the patient was informed that Medicine is not an exact science; therefore, there is also the possibility of unforeseen risks and possible complications that may result in a catastrophic outcome. The patient indicated having understood very clearly. We have given the patient no guarantees and we have made no promises. Enough time was given to the patient to ask questions, all of which were answered to the patient's satisfaction.  Pre-Procedure Preparation: Safety Precautions: Allergies reviewed. Appropriate site, procedure, and patient were confirmed by following the Joint Commission's Universal Protocol (UP.01.01.01), in the form of a "Time Out". The patient was asked to confirm marked site and procedure, before commencing. The patient was asked about blood thinners, or active infections, both of which were denied. Patient was assessed for positional comfort and all pressure points were checked before starting procedure. Monitoring:  As per clinic protocol. Infection Control Precautions: Sterile technique used. Standard Universal  Precautions were taken as recommended by the Department of [facility] for Disease Control and Prevention (CDC). Standard pre-surgical  skin prep was conducted. Respiratory hygiene and cough etiquette was practiced. Hand hygiene observed. Safe injection practices and needle disposal techniques followed. SDV (single dose vial) medications used. Medications properly checked for expiration dates and contaminants. Personal protective equipment (PPE) used: Sterile double glove technique. Radiation resistant gloves. Sterile surgical gloves.  Anesthesia, Analgesia, Anxiolysis: Type: Moderate (Conscious) Sedation & Local Anesthesia. Meaningful verbal contact was maintained, with the patient at all times during the procedure. Local Anesthetic(s): Lidocaine 1% IV Access: Secured Sedation: Meaningful verbal contact was maintained at all times during the procedure. Indication(s):Anxiolysis and Analgesia.  Description of Procedure Process:  Time-out: "Time-out" completed before starting procedure, as per protocol. Position: Prone Target Area: For Lumbar Facet blocks, the target is the groove formed by the junction of the transverse process and superior articular process. For the L5 dorsal ramus, the target is the notch between superior articular process and sacral ala. For the S1 dorsal ramus, the target is the superior and lateral edge of the posterior S1 Sacral foramen. Approach: Paramedial approach. Area Prepped: Entire Posterior Lumbosacral Region Prepping solution: ChloraPrep (2% chlorhexidine gluconate and 70% isopropyl alcohol) Safety Precautions: Aspiration looking for blood return was conducted prior to all injections. At no point did we inject any substances, as a needle was being advanced. No attempts were made at seeking any paresthesias. Safe injection practices and needle disposal techniques used. Medications properly checked for expiration dates. SDV (single dose vial) medications  used. Description of the Procedure: Protocol guidelines were followed. The patient was placed in position over the fluoroscopy table. The target area was identified and the area prepped in the usual manner. Skin desensitized using vapocoolant spray. Skin & deeper tissues infiltrated with local anesthetic. Appropriate amount of time allowed to pass for local anesthetics to take effect. The procedure needle was introduced through the skin, ipsilateral to the reported pain, and advanced to the target area. Employing the "Medial Branch Technique", the needles were advanced to the angle made by the superior and medial portion of the transverse process, and the lateral and inferior portion of the superior articulating process of the targeted vertebral bodies. This area is known as "Burton's Eye" or the "Eye of the [place] Dog". A procedure needle was introduced through the skin, and this time advanced to the angle made by the superior and medial border of the sacral ala, and the lateral border of the S1 vertebral body. This last needle was later repositioned at the superior and lateral border of the posterior S1 foramen. Negative aspiration confirmed. Solution injected in intermittent fashion, asking for systemic symptoms every 0.5cc of injectate. The needles were then removed and the area cleansed, making sure to leave some of the prepping solution back to take advantage of its long term bactericidal properties. EBL: None Materials & Medications Used:  Needle(s) Used: 22g - 3.5" Spinal Needle(s) Medications Administered today: Mr. Dimperio had no medications administered during this visit.Please see chart orders for dosing details.  Imaging Guidance:  Type of Imaging Technique: Fluoroscopy Guidance (Spinal) Indication(s): Assistance in needle guidance and placement for procedures requiring needle placement in or near specific anatomical locations not easily accessible without such assistance. Exposure Time: Please  see nurses notes. Contrast: None required. Fluoroscopic Guidance: I was personally present in the fluoroscopy suite, where the patient was placed in position for the procedure, over the fluoroscopy-compatible table. Fluoroscopy was manipulated, using "Tunnel Vision Technique", to obtain the best possible view of the target area, on the affected side. Parallax error was corrected  before commencing the procedure. A "direction-depth-direction" technique was used to introduce the needle under continuous pulsed fluoroscopic guidance. Once the target was reached, antero-posterior, oblique, and lateral fluoroscopic projection views were taken to confirm needle placement in all planes. Permanently recorded images stored by scanning into EMR. Interpretation: Intraoperative imaging interpretation by performing Physician. Adequate needle placement confirmed. Adequate needle placement confirmed in AP, lateral, & Oblique Views. No contrast injected.  Antibiotics:  Type:  Antibiotics Given (last 72 hours)    None      Indication(s): No indications identified.  Post-operative Assessment:  Complications: No immediate post-treatment complications were observed. Disposition: Return to clinic for follow-up evaluation. The patient tolerated the entire procedure well. A repeat set of vitals were taken after the procedure and the patient was kept under observation following institutional policy, for this procedure. Post-procedural neurological assessment was performed, showing return to baseline, prior to discharge. The patient was discharged home, once institutional criteria were met. The patient was provided with post-procedure discharge instructions, including a section on how to identify potential problems. Should any problems arise concerning this procedure, the patient was given instructions to immediately contact us, at any time, without hesitation. In any case, we plan to contact the patient by telephone for a  follow-up status report regarding this interventional procedure. Comments:  No additional relevant information.  Primary Care Physician: Keith Rake, MD Location: [facility] Note by: [place] A. Dossie Arbour, M.D, DABA, DABAPM, DABPM, DABIPP, FIPP  Disclaimer:  Medicine is not an exact science. The only guarantee in medicine is that nothing is guaranteed. It is important to note that the decision to proceed with this intervention was based on the information collected from the patient. The Data and conclusions were drawn from the patient's questionnaire, the interview, and the physical examination. Because the information was provided in large part by the patient, it cannot be guaranteed that it has not been purposely or unconsciously manipulated. Every effort has been made to obtain as much relevant data as possible for this evaluation. It is important to note that the conclusions that lead to this procedure are derived in large part from the available data. Always take into account that the treatment will also be dependent on availability of resources and existing treatment guidelines, considered by other Pain Management Practitioners as being common knowledge and practice, at the time of the intervention. For Medico-Legal purposes, it is also important to point out that variation in procedural techniques and pharmacological choices are the acceptable norm. The indications, contraindications, technique, and results of the above procedure should only be interpreted and judged by a Board-Certified Interventional Pain Specialist with extensive familiarity and expertise in the same exact procedure and technique. Attempts at providing opinions without similar or greater experience and expertise than that of the treating physician will be considered as inappropriate and unethical, and shall result in a formal complaint to the state medical board and applicable specialty societies.

## 2015-06-11 NOTE — Progress Notes (Signed)
Safety precautions to be maintained throughout the outpatient stay will include: orient to surroundings, keep bed in low position, maintain call bell within reach at all times, provide assistance with transfer out of bed and ambulation.  

## 2015-06-11 NOTE — Progress Notes (Signed)
Patient's Name: Dean Ford MRN: [id number] DOB: May 23, 1976 DOS: 06/11/2015  Primary Reason(s) for Visit: Interventional Pain Management Treatment. CC: Back Pain   Pre-Procedure Assessment: Dean Ford is a 39 y.o. year old, male patient, seen today for interventional treatment. 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[place]); Anxiety disorder; Dehydration symptoms; Acute left flank pain; Dyslipidemia; Encounter for therapeutic drug level monitoring; Long term current use of opiate analgesic; Uncomplicated opioid dependence ([place]); Opiate use; Lumbar spondylosis; Facet syndrome, lumbar; Acute low back pain; Chronic low back pain; Back pain, chronic; Insomnia, persistent; BP (high blood pressure); Adult hypothyroidism; Intermittent muscle cramps; Always thirsty; Avitaminosis D; Lumbar spine pain; Right hip pain; Hip arthrosis; Obesity; DDD (degenerative disc disease), lumbar; L4-L5 disc bulge; Ligamentum flavum hypertrophy (HCC); Bulging lumbar disc; Sleep apnea; Chronic right sacroiliac joint pain; Lumbar facet hypertrophy; Lumbosacral radicular pain; and Lumbosacral radiculopathy at S1 (right-sided) on his problem list.. His primarily concern today is the Back Pain  The patient comes into clinic today scheduled for a right-sided lumbar facet and SI joint injection under fluoroscopic guidance. Even though the patient has EMG evidence of a right-sided chronic S1 radiculopathy, his primary pain today is in the lower back and it's not going down the leg. For this reason, we have decided to proceed with the facet and SI joint block. On 03/20/2014 the patient had his last right-sided lumbar facet and SI joint radiofrequency ablation. Typically this Procedure provides him with 75% relief of his low back pain. The patient has requested a consult with a neurosurgeon to see if there is anything that can be done to address the issue of his pain in a more permanent manner. Today I  have requested a referral for neurosurgery.  Verification of the correct person, correct site (including marking of site), and correct procedure were performed and confirmed by the patient. Today's Vitals   06/11/15 0915 06/11/15 0921 06/11/15 0931 06/11/15 0941  BP: 158/80 122/68 122/73 131/70  Pulse: 86 87 84 79  Temp:      Resp: 16 16 18 18   Height:      Weight:      SpO2: 95% 97% 96% 96%  PainSc: 0-No pain 1  0-No pain 2   PainLoc:      Calculated BMI: Body mass index is 35.93 kg/(m^2). Allergies: He is allergic to borax.. Primary Diagnosis: Spondylosis of lumbar region without myelopathy or radiculopathy [M47.816]  Procedure # 1: Type: Palliative Medial Branch Facet Block Region: Lumbar Level: L2, L3, L4, L5, & S1 Medial Branch Level(s) Laterality: Right  Indications: Spondylosis, Lumbosacral Region Lumbosacral Spine Pain  Procedure # 2: Type: Palliative Sacroiliac Joint Block Region: Posterior Lumbosacral Level: PSIS (Posterior Superior Iliac Spine) Sacroiliac Joint Laterality: Right-Sided  Indications: Chronic Pain Spondylosis, Lumbosacral Region Lumbosacral Spine Pain Sacroiliac Joint Pain Low Back Pain  Consent: Secured. Under the influence of no sedatives a written informed consent was obtained, after having provided information on the risks and possible complications. To fulfill our ethical and legal obligations, as recommended by the American Medical Association's Code of Ethics, we have provided information to the patient about our clinical impression; the nature and purpose of the treatment or procedure; the risks, benefits, and possible complications of the intervention; alternatives; the risk(s) and benefit(s) of the alternative treatment(s) or procedure(s); and the risk(s) and benefit(s) of doing nothing. The patient was provided information about the risks and possible complications associated with the procedure. In the case of spinal procedures these may  include, but are not limited to, failure to achieve desired goals, infection, bleeding, organ or nerve damage, allergic reactions, paralysis, and death. In addition, the patient was informed that Medicine is not an exact science; therefore, there is also the possibility of unforeseen risks and possible complications that may result in a catastrophic outcome. The patient indicated having understood very clearly. We have given the patient no guarantees and we have made no promises. Enough time was given to the patient to ask questions, all of which were answered to the patient's satisfaction.  Pre-Procedure Preparation: Safety Precautions: Allergies reviewed. Appropriate site, procedure, and patient were confirmed by following the Joint Commission's Universal Protocol (UP.01.01.01), in the form of a "Time Out". The patient was asked to confirm marked site and procedure, before commencing. The patient was asked about blood thinners, or active infections, both of which were denied. Patient was assessed for positional comfort and all pressure points were checked before starting procedure. Monitoring:  As per clinic protocol. Infection Control Precautions: Sterile technique used. Standard Universal Precautions were taken as recommended by the Department of [facility] for Disease Control and Prevention (CDC). Standard pre-surgical skin prep was conducted. Respiratory hygiene and cough etiquette was practiced. Hand hygiene observed. Safe injection practices and needle disposal techniques followed. SDV (single dose vial) medications used. Medications properly checked for expiration dates and contaminants. Personal protective equipment (PPE) used: Sterile double glove technique. Radiation resistant gloves. Sterile surgical gloves.  Anesthesia, Analgesia, Anxiolysis: Type: Moderate (Conscious) Sedation & Local Anesthesia. Meaningful verbal contact was maintained, with the patient at all times during the  procedure. Local Anesthetic(s): Lidocaine 1% IV Access: Secured Sedation: Meaningful verbal contact was maintained at all times during the procedure. Indication(s):Anxiolysis and Analgesia.  Description of Procedure #1 Process:  Time-out: "Time-out" completed before starting procedure, as per protocol. Position: Prone Target Area: For Lumbar Facet blocks, the target is the groove formed by the junction of the transverse process and superior articular process. For the L5 dorsal ramus, the target is the notch between superior articular process and sacral ala. For the S1 dorsal ramus, the target is the superior and lateral edge of the posterior S1 Sacral foramen. Approach: Paramedial approach. Area Prepped: Entire Posterior Lumbosacral Region Prepping solution: ChloraPrep (2% chlorhexidine gluconate and 70% isopropyl alcohol) Safety Precautions: Aspiration looking for blood return was conducted prior to all injections. At no point did we inject any substances, as a needle was being advanced. No attempts were made at seeking any paresthesias. Safe injection practices and needle disposal techniques used. Medications properly checked for expiration dates. SDV (single dose vial) medications used. Description of the Procedure: Protocol guidelines were followed. The patient was placed in position over the fluoroscopy table. The target area was identified and the area prepped in the usual manner. Skin desensitized using vapocoolant spray. Skin & deeper tissues infiltrated with local anesthetic. Appropriate amount of time allowed to pass for local anesthetics to take effect. The procedure needle was introduced through the skin, ipsilateral to the reported pain, and advanced to the target area. Employing the "Medial Branch Technique", the needles were advanced to the angle made by the superior and medial portion of the transverse process, and the lateral and inferior portion of the superior articulating process of  the targeted vertebral bodies. This area is known as "Burton's Eye" or the "Eye of the [place] Dog". A procedure needle was introduced through the skin, and this time advanced to the angle made by the superior and medial border  of the sacral ala, and the lateral border of the S1 vertebral body. This last needle was later repositioned at the superior and lateral border of the posterior S1 foramen. Negative aspiration confirmed. Solution injected in intermittent fashion, asking for systemic symptoms every 0.5cc of injectate. The needles were then removed and the area cleansed, making sure to leave some of the prepping solution back to take advantage of its long term bactericidal properties. EBL: None Materials & Medications Used:  Needle(s) Used: 22g - 3.5" Spinal Needle(s) Medications Administered today: We administered ropivacaine (PF) 2 mg/ml (0.2%), fentaNYL, triamcinolone acetonide, ropivacaine (PF) 2 mg/ml (0.2%), and methylPREDNISolone acetate.Please see chart orders for dosing details.  Description of Procedure # 2 Process:  Time-out: "Time-out" completed before starting procedure, as per protocol. Position: Prone Target Area: For upper sacroiliac joint block(s), the target is the superior and posterior margin of the sacroiliac joint. Approach: Ipsilateral approach. Area Prepped: Entire Posterior Lumbosacral Region Prepping solution: Duraprep (Iodine Povacrylex [0.7% available Iodine] and Isopropyl Alcohol, 74% w/w) Safety Precautions: Aspiration looking for blood return was conducted prior to all injections. At no point did we inject any substances, as a needle was being advanced. No attempts were made at seeking any paresthesias. Safe injection practices and needle disposal techniques used. Medications properly checked for expiration dates. SDV (single dose vial) medications used. Description of the Procedure: Protocol guidelines were followed. The patient was placed in position over the  fluoroscopy table. The target area was identified and the area prepped in the usual manner. Skin desensitized using vapocoolant spray. Skin & deeper tissues infiltrated with local anesthetic. Appropriate amount of time allowed to pass for local anesthetics to take effect. The procedure needle was advanced under fluoroscopic guidance into the sacroiliac joint until a firm endpoint was obtained. Proper needle placement secured. Negative aspiration confirmed. Solution injected in intermittent fashion, asking for systemic symptoms every 0.5cc of injectate. The needles were then removed and the area cleansed, making sure to leave some of the prepping solution back to take advantage of its long term bactericidal properties. EBL: None Materials & Medications Used:  Needle(s) Used: 22g - 3.5" Spinal Needle(s) Medications Administered today: We administered ropivacaine (PF) 2 mg/ml (0.2%), fentaNYL, triamcinolone acetonide, ropivacaine (PF) 2 mg/ml (0.2%), and methylPREDNISolone acetate.Please see chart orders for dosing details.  Imaging Guidance:  Type of Imaging Technique: Fluoroscopy Guidance (Spinal) Indication(s): Assistance in needle guidance and placement for procedures requiring needle placement in or near specific anatomical locations not easily accessible without such assistance. Exposure Time: Please see nurses notes. Contrast: None required. Fluoroscopic Guidance: I was personally present in the fluoroscopy suite, where the patient was placed in position for the procedure, over the fluoroscopy-compatible table. Fluoroscopy was manipulated, using "Tunnel Vision Technique", to obtain the best possible view of the target area, on the affected side. Parallax error was corrected before commencing the procedure. A "direction-depth-direction" technique was used to introduce the needle under continuous pulsed fluoroscopic guidance. Once the target was reached, antero-posterior, oblique, and lateral  fluoroscopic projection views were taken to confirm needle placement in all planes. Permanently recorded images stored by scanning into EMR. Interpretation: Intraoperative imaging interpretation by performing Physician. Adequate needle placement confirmed. Adequate needle placement confirmed in AP, lateral, & Oblique Views. No contrast injected.  Antibiotics:  Type:  Antibiotics Given (last 72 hours)    None      Indication(s): No indications identified.  Post-operative Assessment:  Complications: No immediate post-treatment complications were observed. Disposition: Return to clinic for follow-up  evaluation. The patient tolerated the entire procedure well. A repeat set of vitals were taken after the procedure and the patient was kept under observation following institutional policy, for this procedure. Post-procedural neurological assessment was performed, showing return to baseline, prior to discharge. The patient was discharged home, once institutional criteria were met. The patient was provided with post-procedure discharge instructions, including a section on how to identify potential problems. Should any problems arise concerning this procedure, the patient was given instructions to immediately contact us, at any time, without hesitation. In any case, we plan to contact the patient by telephone for a follow-up status report regarding this interventional procedure. Comments:  No additional relevant information.  Primary Care Physician: Keith Rake, MD Location: [facility] Note by: Francisco A. Dossie Arbour, M.D, DABA, DABAPM, DABPM, DABIPP, FIPP  Disclaimer:  Medicine is not an exact science. The only guarantee in medicine is that nothing is guaranteed. It is important to note that the decision to proceed with this intervention was based on the information collected from the patient. The Data and conclusions were drawn from the patient's questionnaire, the interview,  and the physical examination. Because the information was provided in large part by the patient, it cannot be guaranteed that it has not been purposely or unconsciously manipulated. Every effort has been made to obtain as much relevant data as possible for this evaluation. It is important to note that the conclusions that lead to this procedure are derived in large part from the available data. Always take into account that the treatment will also be dependent on availability of resources and existing treatment guidelines, considered by other Pain Management Practitioners as being common knowledge and practice, at the time of the intervention. For Medico-Legal purposes, it is also important to point out that variation in procedural techniques and pharmacological choices are the acceptable norm. The indications, contraindications, technique, and results of the above procedure should only be interpreted and judged by a Board-Certified Interventional Pain Specialist with extensive familiarity and expertise in the same exact procedure and technique. Attempts at providing opinions without similar or greater experience and expertise than that of the treating physician will be considered as inappropriate and unethical, and shall result in a formal complaint to the state medical board and applicable specialty societies.

## 2015-06-12 ENCOUNTER — Telehealth: Payer: Self-pay | Admitting: *Deleted

## 2015-06-12 NOTE — Telephone Encounter (Signed)
Denies complications post procedure. 

## 2015-06-24 ENCOUNTER — Other Ambulatory Visit: Payer: Self-pay | Admitting: Pain Medicine

## 2015-06-25 ENCOUNTER — Ambulatory Visit: Payer: 59 | Admitting: Pain Medicine

## 2015-07-09 ENCOUNTER — Telehealth: Payer: Self-pay | Admitting: Cardiovascular Disease

## 2015-07-09 NOTE — Telephone Encounter (Signed)
3 attempts to schedule fu from recall.  LMOV to call office for scheduling.  Deleting recall.

## 2015-09-02 ENCOUNTER — Encounter (INDEPENDENT_AMBULATORY_CARE_PROVIDER_SITE_OTHER): Payer: Self-pay

## 2015-09-02 ENCOUNTER — Encounter: Payer: Self-pay | Admitting: Pain Medicine

## 2015-09-02 ENCOUNTER — Ambulatory Visit (HOSPITAL_BASED_OUTPATIENT_CLINIC_OR_DEPARTMENT_OTHER): Payer: 59 | Admitting: Pain Medicine

## 2015-09-02 DIAGNOSIS — Z79891 Long term (current) use of opiate analgesic: Secondary | ICD-10-CM | POA: Insufficient documentation

## 2015-09-02 DIAGNOSIS — Z7189 Other specified counseling: Secondary | ICD-10-CM

## 2015-09-02 DIAGNOSIS — Z79899 Other long term (current) drug therapy: Secondary | ICD-10-CM

## 2015-09-02 DIAGNOSIS — G894 Chronic pain syndrome: Secondary | ICD-10-CM | POA: Insufficient documentation

## 2015-09-02 DIAGNOSIS — G8929 Other chronic pain: Secondary | ICD-10-CM | POA: Insufficient documentation

## 2015-09-02 DIAGNOSIS — Z5181 Encounter for therapeutic drug level monitoring: Secondary | ICD-10-CM

## 2015-09-02 NOTE — Progress Notes (Signed)
Patient ID: [name], male   DOB: [birth year], 40 y.o.   MRN: [id number] The patient came in today clinics today indicating that if we continue to order urine drug screen test, he would not be able to continue coming in here. Unfortunately, it is part of our monitoring program for controlled substances. The patient was given the choice of continuing with his visit today, which he chose not to.

## 2015-09-30 DIAGNOSIS — E039 Hypothyroidism, unspecified: Secondary | ICD-10-CM | POA: Diagnosis not present

## 2015-09-30 DIAGNOSIS — I10 Essential (primary) hypertension: Secondary | ICD-10-CM | POA: Diagnosis not present

## 2015-09-30 DIAGNOSIS — E291 Testicular hypofunction: Secondary | ICD-10-CM | POA: Diagnosis not present

## 2015-09-30 DIAGNOSIS — R635 Abnormal weight gain: Secondary | ICD-10-CM | POA: Diagnosis not present

## 2015-09-30 DIAGNOSIS — E2740 Unspecified adrenocortical insufficiency: Secondary | ICD-10-CM | POA: Diagnosis not present

## 2015-12-05 ENCOUNTER — Encounter: Payer: Self-pay | Admitting: Cardiovascular Disease

## 2015-12-05 ENCOUNTER — Ambulatory Visit (INDEPENDENT_AMBULATORY_CARE_PROVIDER_SITE_OTHER): Payer: 59 | Admitting: Cardiovascular Disease

## 2015-12-05 VITALS — BP 120/92 | HR 87 | Ht 74.0 in | Wt 289.1 lb

## 2015-12-05 DIAGNOSIS — G479 Sleep disorder, unspecified: Secondary | ICD-10-CM

## 2015-12-05 DIAGNOSIS — E785 Hyperlipidemia, unspecified: Secondary | ICD-10-CM

## 2015-12-05 DIAGNOSIS — Z13220 Encounter for screening for lipoid disorders: Secondary | ICD-10-CM

## 2015-12-05 DIAGNOSIS — I10 Essential (primary) hypertension: Secondary | ICD-10-CM

## 2015-12-05 LAB — COMPREHENSIVE METABOLIC PANEL
ALBUMIN: 4.4 g/dL (ref 3.6–5.1)
ALK PHOS: 39 U/L — AB (ref 40–115)
ALT: 29 U/L (ref 9–46)
AST: 18 U/L (ref 10–40)
BILIRUBIN TOTAL: 0.5 mg/dL (ref 0.2–1.2)
BUN: 10 mg/dL (ref 7–25)
CO2: 26 mmol/L (ref 20–31)
CREATININE: 1.09 mg/dL (ref 0.60–1.35)
Calcium: 9.2 mg/dL (ref 8.6–10.3)
Chloride: 104 mmol/L (ref 98–110)
GLUCOSE: 80 mg/dL (ref 65–99)
Potassium: 4.5 mmol/L (ref 3.5–5.3)
SODIUM: 139 mmol/L (ref 135–146)
Total Protein: 6.8 g/dL (ref 6.1–8.1)

## 2015-12-05 LAB — LIPID PANEL
CHOLESTEROL: 255 mg/dL — AB (ref 125–200)
HDL: 29 mg/dL — AB (ref >=40)
LDL Cholesterol: 149 mg/dL — ABNORMAL HIGH (ref <130)
TRIGLYCERIDES: 384 mg/dL — AB (ref <150)
Total CHOL/HDL Ratio: 8.8 Ratio — ABNORMAL HIGH (ref <=5.0)
VLDL: 77 mg/dL — ABNORMAL HIGH (ref <30)

## 2015-12-05 MED ORDER — POTASSIUM CHLORIDE ER 10 MEQ PO TBCR: 10.0000 meq | EXTENDED_RELEASE_TABLET | Freq: Every day | ORAL | Status: DC

## 2015-12-05 MED ORDER — LOSARTAN POTASSIUM 100 MG PO TABS: 100.0000 mg | ORAL_TABLET | Freq: Every day | ORAL | Status: DC

## 2015-12-05 MED ORDER — HYDROCHLOROTHIAZIDE 25 MG PO TABS
25.0000 mg | ORAL_TABLET | Freq: Every day | ORAL | Status: DC
Start: 2015-12-05 — End: 2016-08-28

## 2015-12-05 NOTE — Progress Notes (Signed)
Cardiology Office Note   Date:  12/05/2015   ID:  Dean Ford, DOB [birth year], MRN [id number]  PCP:  Keith Rake, MD  Cardiologist:   Acie Fredrickson Wonda Cheng, MD   Chief Complaint  Patient presents with  . Follow-up    htn   1. Hypertension 2. Chest pain - had a negative stress echo in Jan. 2016 3. Hypothyroidism 4. ? Obstructive sleep apnea   History of Present Illness:  Dean Ford is a 40 who is referred by Dr. Debbora Dus for evaluation of his CP. He has a hx of HTN and also has hypoadrenalism, hypothyroidism, low testosterone.  CP is just left of center. Pain seems to be related to emotional stress. Starts with dyspnea. He would typically get shortness of breath, head ache and then get chest pain. Would take another losartan and all the symptoms would improve after an hour. Associated with exertion - walking . Seems to be worse in the afternoon  He snores at night. Wakes up gasping for air frequently. Has been referred for sleep study. He did not make the appt.  Does not get any regular exercise.  Has low energy due to his endocrine issues. Has had a head MRI. pituitary gland was ok. Has had cholesterol checked in the past - was borderline years ago.  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 Was not able to complete his last training session because of shortness of breath.   September 20, 2014:  Dean Ford is a 40 y.o. male who presents for follow-up for his chest pain. He had an echocardiogram that revealed normal left ventricle systolic function. He had a stress echo ( results are not found at this time) He has not been on his BP med.   BP has been higher .    No further CP,  Not exercising at all.   December 13, 2014:  Dean Ford had some chest pain - mid sternal, radiated through his back Worsened with deep breath / deep exhale Went to the  [facility]  Work up was negative  Has felt fine since that time.   Has been working out vigorously without any chest   December 05, 2015 Dean Ford is seen for follow up .   Has had elevated BP . Has been painting his bathroom, gets sweaty, red faced. Has some chest tightness.  Has symptoms of sleep apnea. Snores, wakes up snoring,  Not getting any other exercise  Trying to stay away from fast foods.  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  Has gained 9 lbs in the past 6 months     Wt Readings from Last 3 Encounters:  12/05/15 289 lb 1.9 oz (131.144 kg)  06/11/15 280 lb (127.007 kg)  06/03/15 280 lb (127.007 kg)      Past Medical History  Diagnosis Date  . Hypertension   . Thyroid disease   . Hypothyroid   . Anxiety   . Sleep apnea   . Arthritis   . Hypothyroid   . Back pain, chronic 06/03/2015    Past Surgical History  Procedure Laterality Date  . Elbow surgery      right elbow     Current Outpatient Prescriptions  Medication Sig Dispense Refill  . fludrocortisone (FLORINEF) 0.1 MG tablet Take 0.1  mg by mouth daily.    . hydrocortisone (CORTEF) 10 MG tablet Take 10 mg by mouth daily.     Marland Kitchen levothyroxine (SYNTHROID, LEVOTHROID) 75 MCG tablet Take 75 mcg by mouth daily before breakfast.     . losartan (COZAAR) 100 MG tablet TAKE 1 TABLET (100 MG TOTAL) BY MOUTH DAILY. 30 tablet 6  . testosterone cypionate (DEPOTESTOTERONE CYPIONATE) 200 MG/ML injection Inject 100 mg into the muscle every 14 (fourteen) days.     . traMADol (ULTRAM) 50 MG tablet Take 2 tablets (100 mg total) by mouth every 6 (six) hours as needed. (Patient taking differently: Take 100 mg by mouth every 6 (six) hours as needed for moderate pain. ) 240 tablet 2   No current facility-administered medications for this visit.    Allergies:   Borax    Social History:  The patient  reports that he has quit smoking. He has quit using smokeless tobacco. He reports that he does not drink  alcohol or use illicit drugs.   Family History:  The patient's family history includes Heart attack (age of onset: 49) in his father; Hypertension in his father.    ROS:  Please see the history of present illness.    Review of Systems: Constitutional:  denies fever, chills, diaphoresis, appetite change and fatigue.  HEENT: denies photophobia, eye pain, redness, hearing loss, ear pain, congestion, sore throat, rhinorrhea, sneezing, neck pain, neck stiffness and tinnitus.  Respiratory: denies SOB, DOE, cough, chest tightness, and wheezing.  Cardiovascular: denies chest pain, palpitations and leg swelling.  Gastrointestinal: denies nausea, vomiting, abdominal pain, diarrhea, constipation, blood in stool.  Genitourinary: denies dysuria, urgency, frequency, hematuria, flank pain and difficulty urinating.  Musculoskeletal: denies  myalgias, back pain, joint swelling, arthralgias and gait problem.   Skin: denies pallor, rash and wound.  Neurological: denies dizziness, seizures, syncope, weakness, light-headedness, numbness and headaches.   Hematological: denies adenopathy, easy bruising, personal or family bleeding history.  Psychiatric/ Behavioral: denies suicidal ideation, mood changes, confusion, nervousness, sleep disturbance and agitation.       All other systems are reviewed and negative.    PHYSICAL EXAM: VS:  BP 120/92 mmHg  Pulse 87  Ht 6\' 2"  (1.88 m)  Wt 289 lb 1.9 oz (131.144 kg)  BMI 37.11 kg/m2 , BMI Body mass index is 37.11 kg/(m^2). GEN: Well nourished, well developed, in no acute distress HEENT: normal Neck: no JVD, carotid bruits, or masses Cardiac: RRR;  mmid systolic click  no murmurs, rubs, or gallops,no edema  Respiratory:  clear to auscultation bilaterally, normal work of breathing GI: soft, nontender, nondistended, + BS MS: no deformity or atrophy Skin: warm and dry, no rash Neuro:  Strength and sensation are intact Psych: normal   EKG:  EKG is ordered  today.  ECG reveals:  NSR at 87 .  NS ST abn.  No acute changes     Recent Labs: 03/01/2015: ALT 32; BUN 13; Creatinine, Ser 0.89; Platelets 238; Potassium 4.1; Sodium 141    Lipid Panel No results found for: CHOL, TRIG, HDL, CHOLHDL, VLDL, LDLCALC, LDLDIRECT    Wt Readings from Last 3 Encounters:  12/05/15 289 lb 1.9 oz (131.144 kg)  06/11/15 280 lb (127.007 kg)  06/03/15 280 lb (127.007 kg)      Other studies Reviewed: Additional studies/ records that were reviewed today include:  Stress echo and resting echo . Review of the above records demonstrates: normal LV function    ASSESSMENT AND PLAN:  1. Hypertension -  He has run out of his medications as of 2-3 weeks ago. He still eats some salty foods. I've encouraged him to exercise more and work on some weight loss. I'll see him in 6 month.  2. Chest pain- he had an episode of chest pain that was very atypical. His workup at [facility] was   negative. He had a negative stress echo in January 2016. I do not think that he needs any additional workup.  3. Hypothyroidism - continue meds  4. ? Obstructive sleep apnea - needs to go have his sleep study.   We will reorder.  5. Adrenal insufficiency:. He's followed by an endocrinologist. Continue current dose of Cortef.   Current medicines are reviewed at length with the patient today.  The patient does not have concerns regarding medicines.  The following changes have been made: No changes.   Labs/ tests ordered today include:   No orders of the defined types were placed in this encounter.     Disposition:   FU with me in 6 months.     Signed, Chiamaka Latka, Wonda Cheng, MD  12/05/2015 9:58 AM    [address], [place], [place]  [address] Phone: [phone]; Fax: [phone]

## 2015-12-05 NOTE — Patient Instructions (Signed)
Medication Instructions:  Your physician recommends that you continue on your current medications as directed. Please refer to the Current Medication list given to you today. Refills of your medications have been sent to your pharmacy.   Labwork: TODAY - cholesterol, complete metabolic panel   Testing/Procedures: Your physician has recommended that you have a sleep study. This test records several body functions during sleep, including: brain activity, eye movement, oxygen and carbon dioxide blood levels, heart rate and rhythm, breathing rate and rhythm, the flow of air through your mouth and nose, snoring, body muscle movements, and chest and belly movement.  You will receive a telephone call from our office to schedule this appointment.   Follow-Up: Your physician wants you to follow-up in: 6 months with Dr. Acie Fredrickson.  You will receive a reminder letter in the mail two months in advance. If you don't receive a letter, please call our office to schedule the follow-up appointment.   If you need a refill on your cardiac medications before your next appointment, please call your pharmacy.   Thank you for choosing [facility]! Christen Bame, RN [phone]

## 2015-12-09 ENCOUNTER — Other Ambulatory Visit: Payer: Self-pay | Admitting: Nurse Practitioner

## 2015-12-09 ENCOUNTER — Telehealth: Payer: Self-pay | Admitting: Cardiovascular Disease

## 2015-12-09 DIAGNOSIS — E785 Hyperlipidemia, unspecified: Secondary | ICD-10-CM

## 2015-12-09 MED ORDER — ATORVASTATIN CALCIUM 40 MG PO TABS: 40.0000 mg | ORAL_TABLET | Freq: Every day | ORAL | Status: DC

## 2015-12-09 NOTE — Telephone Encounter (Signed)
New message   Pt verbalized that he is returning Dr.Nashers rn for lab results

## 2015-12-09 NOTE — Telephone Encounter (Signed)
Reviewed lab results with patient who verbalized understanding and agreement to start atorvastatin 40 mg daily.  He is scheduled for repeat fasting lab work on 7/27.  I advised him to call back sooner with questions or concerns.

## 2015-12-18 ENCOUNTER — Encounter: Payer: Self-pay | Admitting: Family Medicine

## 2015-12-18 ENCOUNTER — Encounter (INDEPENDENT_AMBULATORY_CARE_PROVIDER_SITE_OTHER): Payer: Self-pay

## 2015-12-18 ENCOUNTER — Ambulatory Visit (INDEPENDENT_AMBULATORY_CARE_PROVIDER_SITE_OTHER): Payer: 59 | Admitting: Family Medicine

## 2015-12-18 VITALS — BP 136/84 | HR 94 | Temp 98.7°F | Resp 18 | Ht 74.0 in | Wt 287.1 lb

## 2015-12-18 DIAGNOSIS — M5441 Lumbago with sciatica, right side: Secondary | ICD-10-CM | POA: Diagnosis not present

## 2015-12-18 DIAGNOSIS — G8929 Other chronic pain: Secondary | ICD-10-CM | POA: Diagnosis not present

## 2015-12-18 DIAGNOSIS — N529 Male erectile dysfunction, unspecified: Secondary | ICD-10-CM | POA: Insufficient documentation

## 2015-12-18 MED ORDER — SILDENAFIL CITRATE 50 MG PO TABS: 50.0000 mg | ORAL_TABLET | ORAL | Status: DC | PRN

## 2015-12-18 MED ORDER — METAXALONE 800 MG PO TABS: 800.0000 mg | ORAL_TABLET | Freq: Three times a day (TID) | ORAL | Status: DC

## 2015-12-18 NOTE — Progress Notes (Signed)
Name: Dean Ford   MRN: [id number]    DOB: September 24, 1975   Date:12/18/2015       Progress Note  Subjective  Chief Complaint  Chief Complaint  Patient presents with  . Back Pain    lower back spasms  . Erectile Dysfunction    Back Pain This is a chronic (Chronic low back pain, worse recently with radiation down the right leg and felt weakness in the right leg.) problem. The pain is present in the lumbar spine. Quality: Feels more pain in the muscles of the lower back. The pain radiates to the right thigh. The pain is at a severity of 9/10. The symptoms are aggravated by bending, position and standing. Associated symptoms include leg pain and numbness. Pertinent negatives include no bladder incontinence or bowel incontinence. Treatments tried: In the past, seen by Dr. Consuela Mimes, was on Tramadol, which does not help relieve his back pain.  Erectile Dysfunction This is a recurrent problem. The current episode started more than 1 month ago. The problem has been gradually worsening (gotten worse in the last 6 months.) since onset. The nature of his difficulty is achieving erection and maintaining erection. Non-physiologic factors contributing to erectile dysfunction are anxiety. Irritative symptoms do not include frequency, nocturia or urgency. Obstructive symptoms do not include dribbling or straining. Pertinent negatives include no chills. The symptoms are aggravated by stress. Past treatments include nothing (He is on Testosterone injections prescribed by Endocrinologist.).     Past Medical History  Diagnosis Date  . Hypertension   . Thyroid disease   . Hypothyroid   . Anxiety   . Sleep apnea   . Arthritis   . Hypothyroid   . Back pain, chronic 06/03/2015    Past Surgical History  Procedure Laterality Date  . Elbow surgery      right elbow    Family History  Problem Relation Age of Onset  . Heart attack Father 29  . Hypertension Father     Social History   Social History  .  Marital Status: Married    Spouse Name: N/A  . Number of Children: N/A  . Years of Education: N/A   Occupational History  . Not on file.   Social History Main Topics  . Smoking status: Former Research scientist (life sciences)  . Smokeless tobacco: Former Systems developer  . Alcohol Use: No     Comment: rarely  . Drug Use: No  . Sexual Activity: Not on file   Other Topics Concern  . Not on file   Social History Narrative     Current outpatient prescriptions:  .  atorvastatin (LIPITOR) 40 MG tablet, Take 1 tablet (40 mg total) by mouth daily., Disp: 30 tablet, Rfl: 11 .  fludrocortisone (FLORINEF) 0.1 MG tablet, Take 0.1 mg by mouth daily., Disp: , Rfl:  .  hydrochlorothiazide (HYDRODIURIL) 25 MG tablet, Take 1 tablet (25 mg total) by mouth daily., Disp: 90 tablet, Rfl: 3 .  hydrocortisone (CORTEF) 10 MG tablet, Take 10 mg by mouth daily. , Disp: , Rfl:  .  levothyroxine (SYNTHROID, LEVOTHROID) 75 MCG tablet, Take 75 mcg by mouth daily before breakfast. , Disp: , Rfl:  .  losartan (COZAAR) 100 MG tablet, Take 1 tablet (100 mg total) by mouth daily., Disp: 90 tablet, Rfl: 3 .  potassium chloride (K-DUR) 10 MEQ tablet, Take 1 tablet (10 mEq total) by mouth daily., Disp: 90 tablet, Rfl: 3 .  potassium chloride (K-DUR,KLOR-CON) 10 MEQ tablet, , Disp: , Rfl: 3 .  testosterone cypionate (DEPOTESTOTERONE CYPIONATE) 200 MG/ML injection, Inject 100 mg into the muscle every 14 (fourteen) days. , Disp: , Rfl:  .  traMADol (ULTRAM) 50 MG tablet, Take 2 tablets (100 mg total) by mouth every 6 (six) hours as needed. (Patient taking differently: Take 100 mg by mouth every 6 (six) hours as needed for moderate pain. ), Disp: 240 tablet, Rfl: 2  Allergies  Allergen Reactions  . Borax Rash    Borax detergent     Review of Systems  Constitutional: Negative for chills.  Gastrointestinal: Negative for bowel incontinence.  Genitourinary: Negative for bladder incontinence, urgency, frequency and nocturia.  Musculoskeletal: Positive  for back pain.  Neurological: Positive for numbness.  Psychiatric/Behavioral: The patient is nervous/anxious.     Objective  Filed Vitals:   12/18/15 1036  BP: 136/84  Pulse: 94  Temp: 98.7 F (37.1 C)  Resp: 18  Height: 6\' 2"  (1.88 m)  Weight: 287 lb 2 oz (130.239 kg)  SpO2: 96%    Physical Exam  Constitutional: He is well-developed, well-nourished, and in no distress.  Cardiovascular: Normal rate and regular rhythm.   Pulmonary/Chest: Effort normal and breath sounds normal.  Musculoskeletal:       Lumbar back: He exhibits tenderness.       Back:  Nursing note and vitals reviewed.      Assessment & Plan  1. Chronic bilateral low back pain with right-sided sciatica We'll start on short course of muscle relaxant therapy, patient has in the past been followed by pain clinic for lumbar spondylosis and advised to reestablish care with Dr. Consuela Mimes for continuation of therapy. With recent onset of radicular symptoms, will need an MRI lumbar spine.  - metaxalone (SKELAXIN) 800 MG tablet; Take 1 tablet (800 mg total) by mouth 3 (three) times daily.  Dispense: 30 tablet; Refill: 0  2. Erectile dysfunction, unspecified erectile dysfunction type Patient is being seen by endocrinology and is on testosterone replacement therapy. Erectile dysfunction is likely organic plus an anxiety component which further exacerbates the situation. Started on Viagra to be taken as needed and will follow-up with endocrinology. - sildenafil (VIAGRA) 50 MG tablet; Take 1 tablet (50 mg total) by mouth as needed for erectile dysfunction.  Dispense: 10 tablet; Refill: 0   Tarryn Bogdan [facility] 12/18/2015 10:41 AM

## 2016-01-22 ENCOUNTER — Ambulatory Visit: Payer: 59 | Attending: Pain Medicine | Admitting: Pain Medicine

## 2016-01-22 ENCOUNTER — Encounter: Payer: Self-pay | Admitting: Pain Medicine

## 2016-01-22 VITALS — BP 142/85 | HR 85 | Temp 98.6°F | Resp 20 | Ht 74.0 in | Wt 275.0 lb

## 2016-01-22 DIAGNOSIS — R200 Anesthesia of skin: Secondary | ICD-10-CM | POA: Insufficient documentation

## 2016-01-22 DIAGNOSIS — Z79891 Long term (current) use of opiate analgesic: Secondary | ICD-10-CM | POA: Diagnosis not present

## 2016-01-22 DIAGNOSIS — G8929 Other chronic pain: Secondary | ICD-10-CM | POA: Diagnosis not present

## 2016-01-22 DIAGNOSIS — M5441 Lumbago with sciatica, right side: Secondary | ICD-10-CM | POA: Diagnosis not present

## 2016-01-22 DIAGNOSIS — N281 Cyst of kidney, acquired: Secondary | ICD-10-CM | POA: Insufficient documentation

## 2016-01-22 DIAGNOSIS — E860 Dehydration: Secondary | ICD-10-CM | POA: Diagnosis not present

## 2016-01-22 DIAGNOSIS — R079 Chest pain, unspecified: Secondary | ICD-10-CM | POA: Diagnosis not present

## 2016-01-22 DIAGNOSIS — M62830 Muscle spasm of back: Secondary | ICD-10-CM | POA: Diagnosis not present

## 2016-01-22 DIAGNOSIS — G4730 Sleep apnea, unspecified: Secondary | ICD-10-CM | POA: Insufficient documentation

## 2016-01-22 DIAGNOSIS — I10 Essential (primary) hypertension: Secondary | ICD-10-CM | POA: Insufficient documentation

## 2016-01-22 DIAGNOSIS — M533 Sacrococcygeal disorders, not elsewhere classified: Secondary | ICD-10-CM

## 2016-01-22 DIAGNOSIS — M545 Low back pain: Secondary | ICD-10-CM | POA: Diagnosis not present

## 2016-01-22 DIAGNOSIS — G4700 Insomnia, unspecified: Secondary | ICD-10-CM | POA: Diagnosis not present

## 2016-01-22 DIAGNOSIS — M47816 Spondylosis without myelopathy or radiculopathy, lumbar region: Secondary | ICD-10-CM

## 2016-01-22 DIAGNOSIS — M1611 Unilateral primary osteoarthritis, right hip: Secondary | ICD-10-CM | POA: Diagnosis not present

## 2016-01-22 DIAGNOSIS — E669 Obesity, unspecified: Secondary | ICD-10-CM | POA: Diagnosis not present

## 2016-01-22 DIAGNOSIS — M549 Dorsalgia, unspecified: Secondary | ICD-10-CM | POA: Diagnosis present

## 2016-01-22 DIAGNOSIS — N3289 Other specified disorders of bladder: Secondary | ICD-10-CM | POA: Diagnosis not present

## 2016-01-22 DIAGNOSIS — E039 Hypothyroidism, unspecified: Secondary | ICD-10-CM | POA: Insufficient documentation

## 2016-01-22 DIAGNOSIS — N529 Male erectile dysfunction, unspecified: Secondary | ICD-10-CM | POA: Diagnosis not present

## 2016-01-22 DIAGNOSIS — E278 Other specified disorders of adrenal gland: Secondary | ICD-10-CM | POA: Diagnosis not present

## 2016-01-22 DIAGNOSIS — M5116 Intervertebral disc disorders with radiculopathy, lumbar region: Secondary | ICD-10-CM | POA: Diagnosis not present

## 2016-01-22 DIAGNOSIS — F419 Anxiety disorder, unspecified: Secondary | ICD-10-CM | POA: Insufficient documentation

## 2016-01-22 DIAGNOSIS — Z87891 Personal history of nicotine dependence: Secondary | ICD-10-CM | POA: Insufficient documentation

## 2016-01-22 DIAGNOSIS — M5417 Radiculopathy, lumbosacral region: Secondary | ICD-10-CM | POA: Insufficient documentation

## 2016-01-22 DIAGNOSIS — E559 Vitamin D deficiency, unspecified: Secondary | ICD-10-CM | POA: Insufficient documentation

## 2016-01-22 DIAGNOSIS — R208 Other disturbances of skin sensation: Secondary | ICD-10-CM

## 2016-01-22 DIAGNOSIS — M5416 Radiculopathy, lumbar region: Secondary | ICD-10-CM | POA: Insufficient documentation

## 2016-01-22 DIAGNOSIS — M47896 Other spondylosis, lumbar region: Secondary | ICD-10-CM | POA: Insufficient documentation

## 2016-01-22 DIAGNOSIS — E785 Hyperlipidemia, unspecified: Secondary | ICD-10-CM | POA: Insufficient documentation

## 2016-01-22 DIAGNOSIS — M79606 Pain in leg, unspecified: Secondary | ICD-10-CM | POA: Diagnosis present

## 2016-01-22 DIAGNOSIS — Z5181 Encounter for therapeutic drug level monitoring: Secondary | ICD-10-CM

## 2016-01-22 DIAGNOSIS — M5126 Other intervertebral disc displacement, lumbar region: Secondary | ICD-10-CM | POA: Diagnosis not present

## 2016-01-22 DIAGNOSIS — M25551 Pain in right hip: Secondary | ICD-10-CM

## 2016-01-22 MED ORDER — CYCLOBENZAPRINE HCL 10 MG PO TABS: 10.0000 mg | ORAL_TABLET | Freq: Three times a day (TID) | ORAL | Status: DC | PRN

## 2016-01-22 MED ORDER — TRAMADOL HCL 50 MG PO TABS: 50.0000 mg | ORAL_TABLET | Freq: Four times a day (QID) | ORAL | Status: DC | PRN

## 2016-01-22 NOTE — Progress Notes (Signed)
Safety precautions to be maintained throughout the outpatient stay will include: orient to surroundings, keep bed in low position, maintain call bell within reach at all times, provide assistance with transfer out of bed and ambulation.  

## 2016-01-22 NOTE — Progress Notes (Signed)
Patient's Name: Dean Ford  Patient type: Established  MRN: [id number]  Service setting: Ambulatory outpatient  DOB: [birth year]  Location: [facility]  DOS: 01/22/2016  Primary Care Physician: Keith Rake, MD  Note by: Kathlen Brunswick. Dossie Arbour, M.D, DABA, DABAPM, DABPM, Milagros Evener, FIPP  Referring Physician: Roselee Nova, MD  Specialty: Board-Certified Interventional Pain Management  Last Visit to Pain Management: Visit date not found   Primary Reason(s) for Visit: Encounter for prescription drug management & post-procedure evaluation of chronic illness with mild to moderate exacerbation(Level of risk: moderate) CC: Back Pain and Leg Pain   HPI  Dean Ford is a 40 y.o. year old, male patient, who returns today as an established patient. 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[place]); Anxiety disorder; Dehydration symptoms; Dyslipidemia; Encounter for therapeutic drug level monitoring; Long term current use of opiate analgesic; Uncomplicated opioid dependence ([place]); Opiate use; Lumbar spondylosis; Lumbar facet syndrome (Bilateral) (R>L); Chronic low back pain (Location of Primary Source of Pain) (R>L); Insomnia, persistent; BP (high blood pressure); Adult hypothyroidism; Intermittent muscle cramps; Always thirsty; Avitaminosis D; Lumbar spine pain; Hip arthrosis; Obesity; DDD (degenerative disc disease), lumbar; L4-L5 disc bulge; Ligamentum flavum hypertrophy (HCC); Bulging lumbar disc; Sleep apnea; Chronic sacroiliac joint pain (Right); Lumbar facet hypertrophy; Lumbosacral radiculopathy at S1 (right-sided); Chronic pain; Long term prescription opiate use; Encounter for chronic pain management; Chronic bilateral low back pain with right-sided sciatica; Erectile dysfunction; Spasm of paraspinal muscle; Lower extremity numbness (Bilateral) (R>L); Chronic lumbar radicular pain (right S1 and bilateral L5) (Location of Secondary source of pain)  (Bilateral) (R>L); Chronic hip pain (Right); and Osteoarthritis of hip (Right) on his problem list.. His primarily concern today is the Back Pain and Leg Pain   Pain Assessment: Self-Reported Pain Score: 4  Reported level is compatible with observation Pain Type: Chronic pain Pain Location: Back Pain Orientation: Lower, Right Pain Descriptors / Indicators: Aching Pain Frequency: Constant  The patient comes into the clinics today for post-procedure evaluation on the interventional treatment done on Visit date not found. In addition, he comes in today for pharmacological management of his chronic pain.  The patient  reports that he does not use illicit drugs.  Date of Last Visit: 09/02/15 Service Provided on Last Visit:  (patient left visit due to inability to afford UDS)  Controlled Substance Pharmacotherapy Assessment & REMS (Risk Evaluation and Mitigation Strategy)  Analgesic: Tramadol 100 mg every 6 hours (400 mg/day of tramadol) Pill Count: Patient ran out of medication and did not bring his bottles. Patient informed that he needs to bring his bottles for pill counts. MME/day: 40 mg/day.  Pharmacokinetics: Onset of action (Liberation/Absorption): Within expected pharmacological parameters Time to Peak effect (Distribution): Timing and results are as within normal expected parameters Duration of action (Metabolism/Excretion): Within normal limits for medication Pharmacodynamics: Analgesic Effect: More than 50% Activity Facilitation: Medication(s) allow patient to sit, stand, walk, and do the basic ADLs Perceived Effectiveness: Described as relatively effective, allowing for increase in activities of daily living (ADL) Side-effects or Adverse reactions: None reported Monitoring: [place] PMP: Online review of the past 82-monthperiod conducted. Compliant with practice rules and regulations Last UDS on record: No results found for: TOXASSSELUR UDS interpretation: Compliant Medication  Assessment Form: Reviewed. Patient indicates being compliant with therapy Treatment compliance: Compliant Risk Assessment: Aberrant Behavior: None observed today Substance Use Disorder (SUD) Risk Level: Moderate-to-high Risk of opioid abuse or dependence: 0.7-3.0% with doses ? 36 MME/day and 6.1-26% with  doses ? 120 MME/day. Opioid Risk Tool (ORT) Score: Total Score: 0 Low Risk for SUD (Score <3) Depression Scale Score: PHQ-2: PHQ-2 Total Score: 0 No depression (0) PHQ-9: PHQ-9 Total Score: 0 No depression (0-4)  Pharmacologic Plan: No change in therapy, at this time  Laboratory Chemistry  Inflammation Markers No results found for: ESRSEDRATE, CRP  Renal Function Lab Results  Component Value Date   BUN 10 12/05/2015   CREATININE 1.09 12/05/2015   GFRAA 124 03/01/2015   GFRNONAA 107 03/01/2015    Hepatic Function Lab Results  Component Value Date   AST 18 12/05/2015   ALT 29 12/05/2015   ALBUMIN 4.4 12/05/2015    Electrolytes Lab Results  Component Value Date   NA 139 12/05/2015   K 4.5 12/05/2015   CL 104 12/05/2015   CALCIUM 9.2 12/05/2015   MG 2.1 12/19/2012    Pain Modulating Vitamins No results found for: [place], VD125OH2TOT, [id number], [id number], VITAMINB12  Coagulation Parameters Lab Results  Component Value Date   PLT 238 03/01/2015    Note: Labs reviewed. Results made available to patient  Recent Diagnostic Imaging  US Renal  03/11/2015  CLINICAL DATA:  Left flank pain for many years, severe starting 1 week ago. EXAM: RENAL / URINARY TRACT ULTRASOUND COMPLETE COMPARISON:  None. FINDINGS: Right Kidney: Length: 13 cm. Echogenicity within normal limits. No mass or hydronephrosis visualized. Left Kidney: Length: 13 cm. Echogenicity within normal limits. No hydronephrosis visualized. There is a 1.1 x 1.2 x 1.4 cm simple cyst in the midpole left kidney. Bladder: Appears normal for degree of bladder distention. Bilateral ureteral jets are identified.  IMPRESSION: No acute abnormality identified.  No hydronephrosis bilaterally. Electronically Signed   By: Abelardo Diesel M.D.   On: 03/11/2015 14:32   Lumbar Imaging: Lumbar MR wo contrast:  Results for orders placed in visit on 07/08/12  MR [facility] W/O Cm   Narrative * PRIOR REPORT IMPORTED FROM AN EXTERNAL SYSTEM *   PRIOR REPORT IMPORTED FROM THE [facility]NGO WORKFLOW SYSTEM   REASON FOR EXAM:    low back pain lumbar radiculitis  COMMENTS:   PROCEDURE:     MMR - MMR LUMBAR SPINE WO CONTRAST  - Jul 08 2012  9:03AM   RESULT:   Technique: Noncontrasted multiplanar and multisequence imaging of the  lumbar  spine was obtained.   Findings: The conus medullaris terminates at an L1 level. The cauda equina  demonstrate no evidence of clumping or thickening.   At the T12-L1, L1-L2, L2-L3 levels, there is no evidence of thecal sac  stenosis nor neural foraminal narrowing.   At the L3-L4 level a mild subligamentous disc bulge is appreciated without  evidence of thecal sac stenosis nor neural foraminal narrowing.   At the L4-L5 level a broad-based subligamentous disc bulge is appreciated.  There is mild effacement of the anterior CSF space without evidence of  thecal sac stenosis nor neural foraminal narrowing.   At the L5-S1 level central endplate hypertrophy as well as a focal disc  protrusion causes partial effacement of the anterior CSF space. There is  mild bilateral neural foraminal narrowing without evidence of exiting  nerve  root compression or compromise.   The osseous structures demonstrate no evidence of marrow edema nor linear  signal void reflecting fracture. There is near-complete desiccation of the  L5-S1 disc.   IMPRESSION:   1. Multilevel mild regions of spondylolysis without evidence of  significant  thecal sac stenosis nor neural foraminal narrowing.  This study was  compared  to a previous study dated 11/26/2010 and the findings within the lumbar  spine  have  slightly increased.   Thank you for the opportunity to contribute to the care of your patient.       Meds  The patient has a current medication list which includes the following prescription(s): atorvastatin, fludrocortisone, hydrochlorothiazide, hydrocortisone, levothyroxine, losartan, potassium chloride, potassium chloride, sildenafil, testosterone cypionate, tramadol, and cyclobenzaprine.  Current Outpatient Prescriptions on File Prior to Visit  Medication Sig  . atorvastatin (LIPITOR) 40 MG tablet Take 1 tablet (40 mg total) by mouth daily.  . fludrocortisone (FLORINEF) 0.1 MG tablet Take 0.1 mg by mouth daily.  . hydrochlorothiazide (HYDRODIURIL) 25 MG tablet Take 1 tablet (25 mg total) by mouth daily.  . hydrocortisone (CORTEF) 10 MG tablet Take 10 mg by mouth daily.   Marland Kitchen levothyroxine (SYNTHROID, LEVOTHROID) 75 MCG tablet Take 75 mcg by mouth daily before breakfast.   . losartan (COZAAR) 100 MG tablet Take 1 tablet (100 mg total) by mouth daily.  . potassium chloride (K-DUR) 10 MEQ tablet Take 1 tablet (10 mEq total) by mouth daily.  . potassium chloride (K-DUR,KLOR-CON) 10 MEQ tablet   . sildenafil (VIAGRA) 50 MG tablet Take 1 tablet (50 mg total) by mouth as needed for erectile dysfunction.  Marland Kitchen testosterone cypionate (DEPOTESTOTERONE CYPIONATE) 200 MG/ML injection Inject 100 mg into the muscle every 14 (fourteen) days.    No current facility-administered medications on file prior to visit.    ROS  Constitutional: Denies any fever or chills Gastrointestinal: No reported hemesis, hematochezia, vomiting, or acute GI distress Musculoskeletal: Denies any acute onset joint swelling, redness, loss of ROM, or weakness Neurological: No reported episodes of acute onset apraxia, aphasia, dysarthria, agnosia, amnesia, paralysis, loss of coordination, or loss of consciousness  Allergies  Dean Ford is allergic to borax.  [place]  Medical:  Dean Ford  has a past medical history of  Hypertension; Thyroid disease; Hypothyroid; Anxiety; Sleep apnea; Arthritis; Hypothyroid; and Back pain, chronic (06/03/2015). Family: family history includes Heart attack (age of onset: 70) in his father; Hypertension in his father. Surgical:  has past surgical history that includes Elbow surgery. Tobacco:  reports that he has quit smoking. He has quit using smokeless tobacco. Alcohol:  reports that he does not drink alcohol. Drug:  reports that he does not use illicit drugs.  Constitutional Exam  Vitals: Blood pressure 142/85, pulse 85, temperature 98.6 F (37 C), resp. rate 20, height 6' 2"  (1.88 m), weight 275 lb (124.739 kg), SpO2 99 %. General appearance: Well nourished, well developed, and well hydrated. In no acute distress Calculated BMI/Body habitus: Body mass index is 35.29 kg/(m^2). (35-39.9 kg/m2) Severe obesity (Class II) - 136% higher incidence of chronic pain Psych/Mental status: Alert and oriented x 3 (person, place, & time) Eyes: PERLA Respiratory: No evidence of acute respiratory distress  Cervical Spine Exam  Inspection: No masses, redness, or swelling Alignment: Symmetrical ROM: Functional: ROM is within functional limits [facility]) Stability: No instability detected Muscle strength & Tone: Functionally intact Sensory: Unimpaired Palpation: No complaints of tenderness  Upper Extremity (UE) Exam    Side: Right upper extremity  Side: Left upper extremity  Inspection: No masses, redness, swelling, or asymmetry  Inspection: No masses, redness, swelling, or asymmetry  ROM:  ROM:  Functional: ROM is within functional limits [facility])  Functional: ROM is within functional limits [facility])  Muscle strength & Tone: Functionally intact  Muscle strength & Tone: Functionally intact  Sensory: Unimpaired  Sensory: Unimpaired  Palpation: Non-contributory  Palpation: Non-contributory   Thoracic Spine Exam  Inspection: No masses, redness, or swelling Alignment:  Symmetrical ROM: Functional: ROM is within functional limits [facility]) Stability: No instability detected Sensory: Unimpaired Muscle strength & Tone: Functionally intact Palpation: No complaints of tenderness  Lumbar Spine Exam  Inspection: No masses, redness, or swelling Alignment: Symmetrical ROM: Functional: Decreased ROM Stability: No instability detected Muscle strength & Tone: Functionally intact Sensory: Unimpaired Palpation: Tender Provocative Tests: Lumbar Hyperextension and rotation test: Positive for bilateral lumbar facet pain Patrick's Maneuver: Positive for right-sided sacroiliac joint pain and right hip pain.  Gait & Posture Assessment  Ambulation: Unassisted Gait: Unaffected Posture: Antalgic  Lower Extremity Exam    Side: Right lower extremity  Side: Left lower extremity  Inspection: No masses, redness, swelling, or asymmetry ROM:  Inspection: No masses, redness, swelling, or asymmetry ROM:  Functional: ROM is within functional limits [facility])  Functional: ROM is within functional limits [facility])  Muscle strength & Tone: Functionally intact  Muscle strength & Tone: Functionally intact  Sensory: Unimpaired  Sensory: Unimpaired  Palpation: Non-contributory  Palpation: Non-contributory   Assessment & Plan  Primary Diagnosis & Pertinent Problem List: The primary encounter diagnosis was Chronic pain. Diagnoses of Chronic low back pain, Facet syndrome, lumbar, Lumbosacral radiculopathy at S1 (right-sided), Encounter for therapeutic drug level monitoring, Long term current use of opiate analgesic, Spasm of paraspinal muscle, Bilateral leg numbness, Chronic sacroiliac joint pain (Right), Lumbosacral radicular pain, Chronic lumbar radicular pain (right S1 and bilateral L5) (Location of Secondary source of pain) (Bilateral) (R>L), Chronic hip pain (Right), and Primary osteoarthritis of right hip were also pertinent to this visit.  Visit Diagnosis: 1. Chronic pain   2. Chronic  low back pain   3. Facet syndrome, lumbar   4. Lumbosacral radiculopathy at S1 (right-sided)   5. Encounter for therapeutic drug level monitoring   6. Long term current use of opiate analgesic   7. Spasm of paraspinal muscle   8. Bilateral leg numbness   9. Chronic sacroiliac joint pain (Right)   10. Lumbosacral radicular pain   11. Chronic lumbar radicular pain (right S1 and bilateral L5) (Location of Secondary source of pain) (Bilateral) (R>L)   12. Chronic hip pain (Right)   13. Primary osteoarthritis of right hip     Problems updated and reviewed during this visit: Problem  Spasm of Paraspinal Muscle  Lower extremity numbness (Bilateral) (R>L)  Chronic lumbar radicular pain (right S1 and bilateral L5) (Location of Secondary source of pain) (Bilateral) (R>L)   Chronic right-sided S1 dermatomal pain and radiculitis/radiculopathy by EMG/PNCV. Tingling sensation over the L5, bilaterally.   Chronic hip pain (Right)  Osteoarthritis of hip (Right)  Chronic sacroiliac joint pain (Right)  Lumbar facet syndrome (Bilateral) (R>L)  Chronic low back pain (Location of Primary Source of Pain) (R>L)    Problem-specific Plan(s): No problem-specific assessment & plan notes found for this encounter.  No new assessment & plan notes have been filed under this hospital service since the last note was generated. Service: Pain Management   Plan of Care   Problem List Items Addressed This Visit      High   Chronic hip pain (Right) (Chronic)   Relevant Orders   HIP INJECTION   Chronic low back pain (Location of Primary Source of Pain) (R>L) (Chronic)   Relevant Medications   cyclobenzaprine (FLEXERIL) 10 MG tablet   traMADol (ULTRAM) 50 MG tablet   Chronic lumbar radicular  pain (right S1 and bilateral L5) (Location of Secondary source of pain) (Bilateral) (R>L) (Chronic)   Chronic pain - Primary (Chronic)   Relevant Medications   cyclobenzaprine (FLEXERIL) 10 MG tablet   traMADol  (ULTRAM) 50 MG tablet   Other Relevant Orders   Comprehensive metabolic panel   C-reactive protein   Magnesium   Sedimentation rate   Vitamin B12   25-Hydroxyvitamin D Lcms D2+D3   Chronic sacroiliac joint pain (Right) (Chronic)   Relevant Medications   cyclobenzaprine (FLEXERIL) 10 MG tablet   traMADol (ULTRAM) 50 MG tablet   Other Relevant Orders   SACROILIAC JOINT INJECTINS   Lower extremity numbness (Bilateral) (R>L) (Chronic)   Lumbar facet syndrome (Bilateral) (R>L) (Chronic)   Relevant Medications   cyclobenzaprine (FLEXERIL) 10 MG tablet   traMADol (ULTRAM) 50 MG tablet   Other Relevant Orders   LUMBAR FACET(MEDIAL BRANCH NERVE BLOCK) MBNB   Lumbosacral radiculopathy at S1 (right-sided) (Chronic)   Relevant Medications   cyclobenzaprine (FLEXERIL) 10 MG tablet   Other Relevant Orders   LUMBAR EPIDURAL STEROID INJECTION   Osteoarthritis of hip (Right) (Chronic)   Relevant Medications   cyclobenzaprine (FLEXERIL) 10 MG tablet   traMADol (ULTRAM) 50 MG tablet   Spasm of paraspinal muscle (Chronic)   Relevant Medications   cyclobenzaprine (FLEXERIL) 10 MG tablet     Medium   Encounter for therapeutic drug level monitoring   Long term current use of opiate analgesic (Chronic)   Relevant Orders   ToxASSURE Select 13 (MW), Urine    Other Visit Diagnoses    Lumbosacral radicular pain  (Chronic)       Relevant Orders    LUMBAR EPIDURAL STEROID INJECTION        Pharmacotherapy (Medications Ordered): Meds ordered this encounter  Medications  . DISCONTD: cyclobenzaprine (FLEXERIL) 10 MG tablet    Sig: Take 1 tablet (10 mg total) by mouth 3 (three) times daily as needed for muscle spasms. Maximum: 2/day    Dispense:  60 tablet    Refill:  2    Do not place this medication, or any other prescription from our practice, on "Automatic Refill". Patient may have prescription filled one day early if pharmacy is closed on scheduled refill date.  Marland Kitchen DISCONTD: traMADol (ULTRAM)  50 MG tablet    Sig: Take 1-2 tablets (50-100 mg total) by mouth every 6 (six) hours as needed for severe pain.    Dispense:  240 tablet    Refill:  2    Do not place this medication, or any other prescription from our practice, on "Automatic Refill". Patient may have prescription filled one day early if pharmacy is closed on scheduled refill date. Do not fill until: 01/22/16. Do not refill any sooner than every 30 days.  . cyclobenzaprine (FLEXERIL) 10 MG tablet    Sig: Take 1 tablet (10 mg total) by mouth 3 (three) times daily as needed for muscle spasms. Maximum: 2/day    Dispense:  60 tablet    Refill:  5    Do not place this medication, or any other prescription from our practice, on "Automatic Refill". Patient may have prescription filled one day early if pharmacy is closed on scheduled refill date.  . traMADol (ULTRAM) 50 MG tablet    Sig: Take 1-2 tablets (50-100 mg total) by mouth every 6 (six) hours as needed for severe pain.    Dispense:  240 tablet    Refill:  5    Do not place  this medication, or any other prescription from our practice, on "Automatic Refill". Patient may have prescription filled one day early if pharmacy is closed on scheduled refill date. Do not fill until: 01/22/16. Do not refill any sooner than every 30 days.    Lab-work & Procedure Ordered: Orders Placed This Encounter  Procedures  . LUMBAR EPIDURAL STEROID INJECTION  . LUMBAR FACET(MEDIAL BRANCH NERVE BLOCK) MBNB  . LUMBAR EPIDURAL STEROID INJECTION  . SACROILIAC JOINT INJECTINS  . HIP INJECTION  . ToxASSURE Select 13 (MW), Urine  . Comprehensive metabolic panel  . C-reactive protein  . Magnesium  . Sedimentation rate  . Vitamin B12  . 25-Hydroxyvitamin D Lcms D2+D3    Imaging Ordered: None  Interventional Therapies: Scheduled:  None at this time.    Considering:   1. Caudal epidural steroid injection under fluoroscopic guidance, with or without sedation.  2. Lumbar epidural steroid  injection under fluoroscopic guidance, with or without sedation.  3. Bilateral lumbar facet block under fluoroscopic guidance with sedation.  4. Possible lumbar facet radiofrequency ablation.    PRN Procedures:   1. Caudal epidural steroid injection under fluoroscopic guidance, with or without sedation.  2. Right L5-S1 lumbar epidural steroid injection under fluoroscopic guidance, with or without sedation.  3. Diagnostic bilateral lumbar facet block under fluoroscopic guidance and IV sedation.  4. Right-sided sacroiliac joint block under fluoroscopic guidance, without without sedation.  5. Right-sided intra-articular hip joint injection under fluoroscopic guidance, without without sedation.    Referral(s) or Consult(s): None at this time.  New Prescriptions   CYCLOBENZAPRINE (FLEXERIL) 10 MG TABLET    Take 1 tablet (10 mg total) by mouth 3 (three) times daily as needed for muscle spasms. Maximum: 2/day    Medications administered during this visit: Dean Ford had no medications administered during this visit.  Requested PM Follow-up: Return in about 5 months (around 06/29/2016) for Medication Management (6-Mo), Procedure (PRN - Patient will call), Return after ordered test(s)..  Future Appointments Date Time Provider [place]  02/02/2016 8:00 PM [facility] ROOM 6 [facility]  03/12/2016 7:30 AM [facility]  06/29/2016 8:00 AM Milinda Pointer, MD [facility] None    Primary Care Physician: Keith Rake, MD Location: [facility] Note by: Kathlen Brunswick. Dossie Arbour, M.D, DABA, DABAPM, DABPM, DABIPP, FIPP  Pain Score Disclaimer: We use the NRS-11 scale. This is a self-reported, subjective measurement of pain severity with only modest accuracy. It is used primarily to identify changes within a particular patient. It must be understood that outpatient pain scales are significantly less accurate that those used for research, where they  can be applied under ideal controlled circumstances with minimal exposure to variables. In reality, the score is likely to be a combination of pain intensity and pain affect, where pain affect describes the degree of emotional arousal or changes in action readiness caused by the sensory experience of pain. Factors such as social and work situation, setting, emotional state, anxiety levels, expectation, and prior pain experience may influence pain perception and show large inter-individual differences that may also be affected by time variables.  Patient instructions provided at this appointment:: There are no Patient Instructions on file for this visit.

## 2016-01-29 LAB — TOXASSURE SELECT 13 (MW), URINE: PDF: 0

## 2016-02-02 ENCOUNTER — Ambulatory Visit (HOSPITAL_BASED_OUTPATIENT_CLINIC_OR_DEPARTMENT_OTHER): Payer: 59 | Attending: Cardiovascular Disease | Admitting: Cardiology

## 2016-02-02 VITALS — Ht 74.0 in | Wt 275.0 lb

## 2016-02-02 DIAGNOSIS — E669 Obesity, unspecified: Secondary | ICD-10-CM | POA: Insufficient documentation

## 2016-02-02 DIAGNOSIS — Z6837 Body mass index (BMI) 37.0-37.9, adult: Secondary | ICD-10-CM | POA: Diagnosis not present

## 2016-02-02 DIAGNOSIS — G4733 Obstructive sleep apnea (adult) (pediatric): Secondary | ICD-10-CM | POA: Diagnosis not present

## 2016-02-02 DIAGNOSIS — R0683 Snoring: Secondary | ICD-10-CM | POA: Diagnosis not present

## 2016-02-02 DIAGNOSIS — I10 Essential (primary) hypertension: Secondary | ICD-10-CM | POA: Insufficient documentation

## 2016-02-02 DIAGNOSIS — R5383 Other fatigue: Secondary | ICD-10-CM | POA: Diagnosis not present

## 2016-02-02 DIAGNOSIS — G479 Sleep disorder, unspecified: Secondary | ICD-10-CM

## 2016-02-02 DIAGNOSIS — G4736 Sleep related hypoventilation in conditions classified elsewhere: Secondary | ICD-10-CM | POA: Insufficient documentation

## 2016-03-10 ENCOUNTER — Other Ambulatory Visit: Payer: Self-pay | Admitting: Family Medicine

## 2016-03-10 DIAGNOSIS — N529 Male erectile dysfunction, unspecified: Secondary | ICD-10-CM

## 2016-03-11 ENCOUNTER — Telehealth: Payer: Self-pay | Admitting: Cardiology

## 2016-03-11 DIAGNOSIS — G4733 Obstructive sleep apnea (adult) (pediatric): Secondary | ICD-10-CM

## 2016-03-11 NOTE — Telephone Encounter (Signed)
Please let patient know that they have sleep apnea and recommend CPAP titration. Please set up titration in the sleep lab. 

## 2016-03-11 NOTE — Telephone Encounter (Signed)
Patient has sleep apnea and the CPAP titration is recommended. Please set up CPAP titration

## 2016-03-11 NOTE — Procedures (Signed)
  Patient Name: Dean Ford, Dean Ford Date: 02/02/2016 Gender: Male D.O.B: May 24, 1976 Age (years): 40 Referring Provider: Mertie Moores Height (inches): 72 Interpreting Physician: Fransico Him MD, ABSM Weight (lbs): 275 RPSGT: Baxter Flattery BMI: 37 MRN: [id number] Neck Size: 18.00  CLINICAL INFORMATION Sleep Study Type: NPSG Indication for sleep study: Fatigue, Hypertension, Obesity, Snoring, Witnessed Apneas   SLEEP STUDY TECHNIQUE As per the AASM Manual for the Scoring of Sleep and Associated Events v2.3 (April 2016) with a hypopnea requiring 4% desaturations. The channels recorded and monitored were frontal, central and occipital EEG, electrooculogram (EOG), submentalis EMG (chin), nasal and oral airflow, thoracic and abdominal wall motion, anterior tibialis EMG, snore microphone, electrocardiogram, and pulse oximetry.  MEDICATIONS Patient's medications include: Reviewed in the chart. Medications self-administered by patient during sleep study : No sleep medicine administered.  SLEEP ARCHITECTURE The study was initiated at 9:56:55 PM and ended at 5:04:32 AM. Sleep onset time was 0.6 minutes and the sleep efficiency was 91.9%. The total sleep time was 393.0 minutes. Stage REM latency was prolonged at 205.0 minutes. The patient spent 4.45% of the night in stage N1 sleep, 79.90% in stage N2 sleep, 2.93% in stage N3 and 12.72% in REM. Alpha intrusion was absent. Supine sleep was 100.00%.  RESPIRATORY PARAMETERS The overall apnea/hypopnea index (AHI) was 20.3 per hour. There were 44 total apneas, including 43 obstructive, 1 central and 0 mixed apneas. There were 89 hypopneas and 0 RERAs. The AHI during Stage REM sleep was 68.4 per hour. AHI while supine was 20.3 per hour. The mean oxygen saturation was 94.17%. The minimum SpO2 during sleep was 75.00%. Loud snoring was noted during this study.  CARDIAC DATA The 2 lead EKG demonstrated sinus rhythm. The mean heart rate was 79.56  beats per minute. Other EKG findings include: None.  LEG MOVEMENT DATA The total PLMS were 0 with a resulting PLMS index of 0.00. Associated arousal with leg movement index was 0.0 .  IMPRESSIONS - Moderate obstructive sleep apnea occurred during this study (AHI = 20.3/h). - No significant central sleep apnea occurred during this study (CAI = 0.2/h). - Moderate oxygen desaturation was noted during this study (Min O2 = 75.00%). - The patient snored with Loud snoring volume. - No cardiac abnormalities were noted during this study. - Clinically significant periodic limb movements did not occur during sleep. No significant associated arousals.  DIAGNOSIS - Obstructive Sleep Apnea (327.23 [G47.33 ICD-10]) - Nocturnal Hypoxemia (327.26 [G47.36 ICD-10])  RECOMMENDATIONS - Therapeutic CPAP titration to determine optimal pressure required to alleviate sleep disordered breathing. - Positional therapy avoiding supine position during sleep. - Avoid alcohol, sedatives and other CNS depressants that may worsen sleep apnea and disrupt normal sleep architecture. - Sleep hygiene should be reviewed to assess factors that may improve sleep quality. - Weight management and regular exercise should be initiated or continued if appropriate.   [place], American Board of Sleep Medicine  ELECTRONICALLY SIGNED ON:  03/11/2016, 1:24 PM [place] PH: [phone]   FX: [phone] [place]

## 2016-03-12 ENCOUNTER — Other Ambulatory Visit: Payer: 59 | Admitting: *Deleted

## 2016-03-12 DIAGNOSIS — E785 Hyperlipidemia, unspecified: Secondary | ICD-10-CM

## 2016-03-12 LAB — COMPREHENSIVE METABOLIC PANEL
ALBUMIN: 4.3 g/dL (ref 3.6–5.1)
ALK PHOS: 44 U/L (ref 40–115)
ALT: 36 U/L (ref 9–46)
AST: 26 U/L (ref 10–40)
BUN: 11 mg/dL (ref 7–25)
CALCIUM: 9.3 mg/dL (ref 8.6–10.3)
CHLORIDE: 102 mmol/L (ref 98–110)
CO2: 25 mmol/L (ref 20–31)
CREATININE: 1.04 mg/dL (ref 0.60–1.35)
Glucose, Bld: 92 mg/dL (ref 65–99)
POTASSIUM: 4 mmol/L (ref 3.5–5.3)
SODIUM: 137 mmol/L (ref 135–146)
Total Bilirubin: 0.7 mg/dL (ref 0.2–1.2)
Total Protein: 6.7 g/dL (ref 6.1–8.1)

## 2016-03-12 LAB — LIPID PANEL
CHOL/HDL RATIO: 6.2 ratio — AB (ref <=5.0)
Cholesterol: 185 mg/dL (ref 125–200)
HDL: 30 mg/dL — AB (ref >=40)
LDL Cholesterol: 97 mg/dL (ref <130)
Triglycerides: 290 mg/dL — ABNORMAL HIGH (ref <150)
VLDL: 58 mg/dL — ABNORMAL HIGH (ref <30)

## 2016-03-13 ENCOUNTER — Telehealth: Payer: Self-pay | Admitting: Emergency Medicine

## 2016-03-13 NOTE — Telephone Encounter (Signed)
Spoke to patient. He had sleep study thru [facility] in [place]. Patient was notified to call the office to the Pulmonologist that referred him for results and to get cpap titration set up

## 2016-03-17 DIAGNOSIS — E039 Hypothyroidism, unspecified: Secondary | ICD-10-CM | POA: Diagnosis not present

## 2016-03-17 DIAGNOSIS — E2740 Unspecified adrenocortical insufficiency: Secondary | ICD-10-CM | POA: Diagnosis not present

## 2016-03-17 DIAGNOSIS — I10 Essential (primary) hypertension: Secondary | ICD-10-CM | POA: Diagnosis not present

## 2016-03-17 DIAGNOSIS — E291 Testicular hypofunction: Secondary | ICD-10-CM | POA: Diagnosis not present

## 2016-03-20 DIAGNOSIS — D239 Other benign neoplasm of skin, unspecified: Secondary | ICD-10-CM | POA: Diagnosis not present

## 2016-03-20 DIAGNOSIS — L409 Psoriasis, unspecified: Secondary | ICD-10-CM | POA: Diagnosis not present

## 2016-03-23 NOTE — Telephone Encounter (Signed)
Spoke with patient and discussed results of sleep study. Let patient know that he has sleep apnea and Dr.Turner is recommending he go back for a cpap titration. Patient verbalized understanding and stated he had no questions when given the opportunity to ask. Will mail paperwork out to patient after appt is scheduled for cpap titration. Encouraged patient to call with any questions or concerns he may have in the future.

## 2016-03-23 NOTE — Addendum Note (Signed)
Addended by: Marion Downer A on: 03/23/2016 01:56 PM   Modules accepted: Orders

## 2016-03-26 ENCOUNTER — Encounter: Payer: Self-pay | Admitting: Pain Medicine

## 2016-03-26 ENCOUNTER — Ambulatory Visit: Payer: 59 | Attending: Pain Medicine | Admitting: Pain Medicine

## 2016-03-26 VITALS — BP 132/60 | HR 94 | Temp 97.5°F | Resp 14 | Ht 74.0 in | Wt 275.0 lb

## 2016-03-26 DIAGNOSIS — M5136 Other intervertebral disc degeneration, lumbar region: Secondary | ICD-10-CM | POA: Diagnosis not present

## 2016-03-26 DIAGNOSIS — G4700 Insomnia, unspecified: Secondary | ICD-10-CM | POA: Insufficient documentation

## 2016-03-26 DIAGNOSIS — M5126 Other intervertebral disc displacement, lumbar region: Secondary | ICD-10-CM | POA: Insufficient documentation

## 2016-03-26 DIAGNOSIS — M1610 Unilateral primary osteoarthritis, unspecified hip: Secondary | ICD-10-CM | POA: Diagnosis not present

## 2016-03-26 DIAGNOSIS — M47816 Spondylosis without myelopathy or radiculopathy, lumbar region: Secondary | ICD-10-CM | POA: Insufficient documentation

## 2016-03-26 DIAGNOSIS — R079 Chest pain, unspecified: Secondary | ICD-10-CM | POA: Diagnosis not present

## 2016-03-26 DIAGNOSIS — E559 Vitamin D deficiency, unspecified: Secondary | ICD-10-CM | POA: Diagnosis not present

## 2016-03-26 DIAGNOSIS — E039 Hypothyroidism, unspecified: Secondary | ICD-10-CM | POA: Diagnosis not present

## 2016-03-26 DIAGNOSIS — M5441 Lumbago with sciatica, right side: Secondary | ICD-10-CM | POA: Diagnosis not present

## 2016-03-26 DIAGNOSIS — G4730 Sleep apnea, unspecified: Secondary | ICD-10-CM | POA: Diagnosis not present

## 2016-03-26 DIAGNOSIS — M5417 Radiculopathy, lumbosacral region: Secondary | ICD-10-CM | POA: Diagnosis not present

## 2016-03-26 DIAGNOSIS — F419 Anxiety disorder, unspecified: Secondary | ICD-10-CM | POA: Insufficient documentation

## 2016-03-26 DIAGNOSIS — E669 Obesity, unspecified: Secondary | ICD-10-CM | POA: Diagnosis not present

## 2016-03-26 DIAGNOSIS — E278 Other specified disorders of adrenal gland: Secondary | ICD-10-CM | POA: Diagnosis not present

## 2016-03-26 DIAGNOSIS — I10 Essential (primary) hypertension: Secondary | ICD-10-CM | POA: Diagnosis not present

## 2016-03-26 DIAGNOSIS — M533 Sacrococcygeal disorders, not elsewhere classified: Secondary | ICD-10-CM | POA: Diagnosis not present

## 2016-03-26 DIAGNOSIS — G8929 Other chronic pain: Secondary | ICD-10-CM | POA: Diagnosis not present

## 2016-03-26 DIAGNOSIS — M545 Low back pain: Secondary | ICD-10-CM | POA: Diagnosis not present

## 2016-03-26 DIAGNOSIS — N529 Male erectile dysfunction, unspecified: Secondary | ICD-10-CM | POA: Diagnosis not present

## 2016-03-26 DIAGNOSIS — Z79891 Long term (current) use of opiate analgesic: Secondary | ICD-10-CM | POA: Insufficient documentation

## 2016-03-26 DIAGNOSIS — E860 Dehydration: Secondary | ICD-10-CM | POA: Insufficient documentation

## 2016-03-26 DIAGNOSIS — E785 Hyperlipidemia, unspecified: Secondary | ICD-10-CM | POA: Insufficient documentation

## 2016-03-26 MED ORDER — LIDOCAINE HCL (PF) 1 % IJ SOLN
10.0000 mL | Freq: Once | INTRAMUSCULAR | Status: AC
  Administered 2016-03-26: 10 mL
  Filled 2016-03-26: qty 10

## 2016-03-26 MED ORDER — LACTATED RINGERS IV SOLN
1000.0000 mL | Freq: Once | INTRAVENOUS | Status: AC
  Administered 2016-03-26: 1000 mL via INTRAVENOUS

## 2016-03-26 MED ORDER — FENTANYL CITRATE (PF) 100 MCG/2ML IJ SOLN
25.0000 ug | INTRAMUSCULAR | Status: DC | PRN
  Filled 2016-03-26: qty 2

## 2016-03-26 MED ORDER — MIDAZOLAM HCL 5 MG/5ML IJ SOLN
1.0000 mg | INTRAMUSCULAR | Status: DC | PRN
  Filled 2016-03-26: qty 5

## 2016-03-26 MED ORDER — ROPIVACAINE HCL 2 MG/ML IJ SOLN
9.0000 mL | Freq: Once | INTRAMUSCULAR | Status: AC
  Administered 2016-03-26: 9 mL
  Filled 2016-03-26: qty 10

## 2016-03-26 MED ORDER — TRIAMCINOLONE ACETONIDE 40 MG/ML IJ SUSP
40.0000 mg | Freq: Once | INTRAMUSCULAR | Status: AC
  Administered 2016-03-26: 40 mg
  Filled 2016-03-26: qty 1

## 2016-03-26 NOTE — Progress Notes (Signed)
Patient's Name: Dean Ford  Patient type: Established  MRN: [id number]  Service setting: Ambulatory outpatient  DOB: December 15, 1975  Location: [facility]  DOS: 03/26/2016  Primary Care Physician: Keith Rake, MD  Note by: Kathlen Brunswick. Dossie Arbour, M.D, DABA, DABAPM, DABPM, Milagros Evener, FIPP  Referring Physician: Roselee Nova, MD  Specialty: Board-Certified Interventional Pain Management  Last Visit to Pain Management: 01/22/2016   Primary Reason(s) for Visit: Interventional Pain Management Treatment. CC: Back Pain (low)  Facet syndrome, lumbar [M54.5]   Procedure:  Anesthesia, Analgesia, Anxiolysis:  Type: Diagnostic Medial Branch Facet Block Region: Lumbar Level: L2, L3, L4, L5, & S1 Medial Branch Level(s) Laterality: Right  Indications: 1. Lumbar facet syndrome (Bilateral) (R>L)     Pre-procedure Pain Score: 5/10 Reported level of pain is compatible with clinical observations Post-procedure Pain Score: 3   Type: Moderate (Conscious) Sedation & Local Anesthesia Local Anesthetic: Lidocaine 1% Route: Intravenous (IV) IV Access: Secured Sedation: Meaningful verbal contact was maintained at all times during the procedure  Indication(s): Analgesia & Anxiolysis   Pre-Procedure Assessment:  Dean Ford is a 40 y.o. year old, male patient, seen today for interventional treatment. 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[place]); Anxiety disorder; Dehydration symptoms; Dyslipidemia; Encounter for therapeutic drug level monitoring; Long term current use of opiate analgesic; Uncomplicated opioid dependence ([place]); Opiate use; Lumbar spondylosis; Lumbar facet syndrome (Bilateral) (R>L); Chronic low back pain (Location of Primary Source of Pain) (R>L); Insomnia, persistent; BP (high blood pressure); Adult hypothyroidism; Intermittent muscle cramps; Always thirsty; Avitaminosis D; Lumbar spine pain; Hip arthrosis; Obesity; DDD (degenerative disc  disease), lumbar; L4-L5 disc bulge; Ligamentum flavum hypertrophy (HCC); Bulging lumbar disc; Sleep apnea; Chronic sacroiliac joint pain (Right); Lumbar facet hypertrophy; Lumbosacral radiculopathy at S1 (right-sided); Chronic pain; Long term prescription opiate use; Encounter for chronic pain management; Chronic bilateral low back pain with right-sided sciatica; Erectile dysfunction; Spasm of paraspinal muscle; Lower extremity numbness (Bilateral) (R>L); Chronic lumbar radicular pain (right S1 and bilateral L5) (Location of Secondary source of pain) (Bilateral) (R>L); Chronic hip pain (Right); and Osteoarthritis of hip (Right) on his problem list.. His primarily concern today is the Back Pain (low)  Pain primarily on the right lower back, going down the right leg, thru the back, to the mid calf area.  Pain Type: Chronic pain Pain Location: Back Pain Orientation: Lower Pain Descriptors / Indicators: Aching, Spasm Pain Frequency: Constant  Date of Last Visit: 01/22/16 Service Provided on Last Visit: Med Refill  Coagulation Parameters Lab Results  Component Value Date   PLT 238 03/01/2015    Verification of the correct person, correct site (including marking of site), and correct procedure were performed and confirmed by the patient.  Consent: Secured. Under the influence of no sedatives a written informed consent was obtained, after having provided information on the risks and possible complications. To fulfill our ethical and legal obligations, as recommended by the American Medical Association's Code of Ethics, we have provided information to the patient about our clinical impression; the nature and purpose of the treatment or procedure; the risks, benefits, and possible complications of the intervention; alternatives; the risk(s) and benefit(s) of the alternative treatment(s) or procedure(s); and the risk(s) and benefit(s) of doing nothing. The patient was provided information about the risks  and possible complications associated with the procedure. These include, but are not limited to, failure to achieve desired goals, infection, bleeding, organ or nerve damage, allergic reactions, paralysis, and death. In the case  of spinal procedures these may include, but are not limited to, failure to achieve desired goals, infection, bleeding, organ or nerve damage, allergic reactions, paralysis, and death. In addition, the patient was informed that Medicine is not an exact science; therefore, there is also the possibility of unforeseen risks and possible complications that may result in a catastrophic outcome. The patient indicated having understood very clearly. We have given the patient no guarantees and we have made no promises. Enough time was given to the patient to ask questions, all of which were answered to the patient's satisfaction.  Consent Attestation: I, the ordering provider, attest that I have discussed with the patient the benefits, risks, side-effects, alternatives, likelihood of achieving goals, and potential problems during recovery for the procedure that I have provided informed consent.  Pre-Procedure Preparation: Safety Precautions: Allergies reviewed. Appropriate site, procedure, and patient were confirmed by following the Joint Commission's Universal Protocol (UP.01.01.01), in the form of a "Time Out". The patient was asked to confirm marked site and procedure, before commencing. The patient was asked about blood thinners, or active infections, both of which were denied. Patient was assessed for positional comfort and all pressure points were checked before starting procedure. Allergies: He is allergic to borax.. Infection Control Precautions: Sterile technique used. Standard Universal Precautions were taken as recommended by the Department of [facility] for Disease Control and Prevention (CDC). Standard pre-surgical skin prep was conducted. Respiratory hygiene and cough  etiquette was practiced. Hand hygiene observed. Safe injection practices and needle disposal techniques followed. SDV (single dose vial) medications used. Medications properly checked for expiration dates and contaminants. Personal protective equipment (PPE) used: Sterile Radiation-resistant gloves. Monitoring:  As per clinic protocol. Vitals:   03/26/16 1417 03/26/16 1427 03/26/16 1437 03/26/16 1447  BP: 126/70 127/60 134/66 132/60  Pulse: 92 99 96 94  Resp: _0 Temp: 98.3 F (36.8 C)   97.5 F (36.4 C)  SpO2: 94% 94% 95% 97%  Weight:      Height:      Calculated BMI: Body mass index is 35.31 kg/m.  Description of Procedure Process:   Time-out: "Time-out" completed before starting procedure, as per protocol. Position: Prone Target Area: For Lumbar Facet blocks, the target is the groove formed by the junction of the transverse process and superior articular process. For the L5 dorsal ramus, the target is the notch between superior articular process and sacral ala. For the S1 dorsal ramus, the target is the superior and lateral edge of the posterior S1 Sacral foramen. Approach: Paramedial approach. Area Prepped: Entire Posterior Lumbosacral Region Prepping solution: ChloraPrep (2% chlorhexidine gluconate and 70% isopropyl alcohol) Safety Precautions: Aspiration looking for blood return was conducted prior to all injections. At no point did we inject any substances, as a needle was being advanced. No attempts were made at seeking any paresthesias. Safe injection practices and needle disposal techniques used. Medications properly checked for expiration dates. SDV (single dose vial) medications used.   Description of the Procedure: Protocol guidelines were followed. The patient was placed in position over the fluoroscopy table. The target area was identified and the area prepped in the usual manner. Skin desensitized using vapocoolant spray. Skin & deeper tissues infiltrated with local  anesthetic. Appropriate amount of time allowed to pass for local anesthetics to take effect. The procedure needle was introduced through the skin, ipsilateral to the reported pain, and advanced to the target area. Employing the "Medial Branch Technique", the needles were advanced to  the angle made by the superior and medial portion of the transverse process, and the lateral and inferior portion of the superior articulating process of the targeted vertebral bodies. This area is known as "Burton's Eye" or the "Eye of the [place] Dog". A procedure needle was introduced through the skin, and this time advanced to the angle made by the superior and medial border of the sacral ala, and the lateral border of the S1 vertebral body. This last needle was later repositioned at the superior and lateral border of the posterior S1 foramen. Negative aspiration confirmed. Solution injected in intermittent fashion, asking for systemic symptoms every 0.5cc of injectate. The needles were then removed and the area cleansed, making sure to leave some of the prepping solution back to take advantage of its long term bactericidal properties. EBL: None Materials & Medications Used:  Needle(s) Used: 22g - 3.5" Spinal Needle(s)  Imaging Guidance:   Type of Imaging Technique: Fluoroscopy Guidance (Spinal) Indication(s): Assistance in needle guidance and placement for procedures requiring needle placement in or near specific anatomical locations not easily accessible without such assistance. Exposure Time: Please see nurses notes. Contrast: None required. Fluoroscopic Guidance: I was personally present in the fluoroscopy suite, where the patient was placed in position for the procedure, over the fluoroscopy-compatible table. Fluoroscopy was manipulated, using "Tunnel Vision Technique", to obtain the best possible view of the target area, on the affected side. Parallax error was corrected before commencing the procedure. A  "direction-depth-direction" technique was used to introduce the needle under continuous pulsed fluoroscopic guidance. Once the target was reached, antero-posterior, oblique, and lateral fluoroscopic projection views were taken to confirm needle placement in all planes. Permanently recorded images stored by scanning into EMR. Interpretation: Intraoperative imaging interpretation by performing Physician. Adequate needle placement confirmed. Adequate needle placement confirmed in AP, lateral, & Oblique Views. No contrast injected.  Antibiotic Prophylaxis:  Indication(s): No indications identified. Type:  Antibiotics Given (last 72 hours)    None       Post-operative Assessment:   Complications: No immediate post-treatment complications were observed. Disposition: Return to clinic for follow-up evaluation. The patient tolerated the entire procedure well. A repeat set of vitals were taken after the procedure and the patient was kept under observation following institutional policy, for this procedure. Post-procedural neurological assessment was performed, showing return to baseline, prior to discharge. The patient was discharged home, once institutional criteria were met. The patient was provided with post-procedure discharge instructions, including a section on how to identify potential problems. Should any problems arise concerning this procedure, the patient was given instructions to immediately contact us, at any time, without hesitation. In any case, we plan to contact the patient by telephone for a follow-up status report regarding this interventional procedure. Comments:  No additional relevant information.  Plan of Care   Problem List Items Addressed This Visit      High   Lumbar facet syndrome (Bilateral) (R>L) - Primary (Chronic)   Relevant Medications   hydrocortisone (CORTEF) 5 MG tablet   metaxalone (SKELAXIN) 800 MG tablet   fentaNYL (SUBLIMAZE) injection 25-50 mcg   lactated  ringers infusion 1,000 mL (Completed)   midazolam (VERSED) 5 MG/5ML injection 1-2 mg   triamcinolone acetonide (KENALOG-40) injection 40 mg (Completed)   lidocaine (PF) (XYLOCAINE) 1 % injection 10 mL (Completed)   ropivacaine (PF) 2 mg/ml (0.2%) (NAROPIN) epidural 9 mL (Completed)   Other Relevant Orders   LUMBAR FACET(MEDIAL BRANCH NERVE BLOCK) MBNB    Other Visit Diagnoses  None.      Pharmacotherapy (Medications Ordered): Meds ordered this encounter  Medications  . fentaNYL (SUBLIMAZE) injection 25-50 mcg    Make sure Narcan is available in the pyxis when using this medication. In the event of respiratory depression (RR< 8/min): Titrate NARCAN (naloxone) in increments of 0.1 to 0.2 mg IV at 2-3 minute intervals, until desired degree of reversal.  . lactated ringers infusion 1,000 mL  . midazolam (VERSED) 5 MG/5ML injection 1-2 mg    Make sure Flumazenil is available in the pyxis when using this medication. If oversedation occurs, administer 0.2 mg IV over 15 sec. If after 45 sec no response, administer 0.2 mg again over 1 min; may repeat at 1 min intervals; not to exceed 4 doses (1 mg)  . triamcinolone acetonide (KENALOG-40) injection 40 mg  . lidocaine (PF) (XYLOCAINE) 1 % injection 10 mL  . ropivacaine (PF) 2 mg/ml (0.2%) (NAROPIN) epidural 9 mL    Lab-work & Procedure Ordered: Orders Placed This Encounter  Procedures  . LUMBAR FACET(MEDIAL BRANCH NERVE BLOCK) MBNB    New medications started at this visit: New Prescriptions   No medications on file    Medications administered during this visit: We administered lactated ringers, triamcinolone acetonide, lidocaine (PF), and ropivacaine (PF) 2 mg/ml (0.2%).  Requested PM Follow-up: Return in about 2 weeks (around 04/09/2016) for Post-Procedure evaluation.  Future Appointments Date Time Provider [place]  04/15/2016 8:20 AM Milinda Pointer, MD [facility] None  05/03/2016 8:00 PM [facility] 8 [facility]    06/29/2016 8:00 AM Milinda Pointer, MD [facility] None    Primary Care Physician: Keith Rake, MD Location: [facility] Note by: Kathlen Brunswick. Dossie Arbour, M.D, DABA, DABAPM, DABPM, DABIPP, FIPP   Illustration of the posterior view of the lumbar spine and the posterior neural structures. Laminae of L2 through S1 are labeled. DPRL5, dorsal primary ramus of L5; DPRS1, dorsal primary ramus of S1; DPR3, dorsal primary ramus of L3; FJ, facet (zygapophyseal) joint L3-L4; I, inferior articular process of L4; LB1, lateral branch of dorsal primary ramus of L1; IAB, inferior articular branches from L3 medial branch (supplies L4-L5 facet joint); IBP, intermediate branch plexus; MB3, medial branch of dorsal primary ramus of L3; NR3, third lumbar nerve root; S, superior articular process of L5; SAB, superior articular branches from L4 (supplies L4-5 facet joint also); TP3, transverse process of L3.  Disclaimer:  Medicine is not an Chief Strategy Officer. The only guarantee in medicine is that nothing is guaranteed. It is important to note that the decision to proceed with this intervention was based on the information collected from the patient. The Data and conclusions were drawn from the patient's questionnaire, the interview, and the physical examination. Because the information was provided in large part by the patient, it cannot be guaranteed that it has not been purposely or unconsciously manipulated. Every effort has been made to obtain as much relevant data as possible for this evaluation. It is important to note that the conclusions that lead to this procedure are derived in large part from the available data. Always take into account that the treatment will also be dependent on availability of resources and existing treatment guidelines, considered by other Pain Management Practitioners as being common knowledge and practice, at the time of the intervention. For Medico-Legal purposes, it is  also important to point out that variation in procedural techniques and pharmacological choices are the acceptable norm. The indications, contraindications, technique, and results of the above procedure  should only be interpreted and judged by a Board-Certified Interventional Pain Specialist with extensive familiarity and expertise in the same exact procedure and technique. Attempts at providing opinions without similar or greater experience and expertise than that of the treating physician will be considered as inappropriate and unethical, and shall result in a formal complaint to the state medical board and applicable specialty societies.

## 2016-03-26 NOTE — Patient Instructions (Signed)
Facet Blocks Patient Information  Description: The facets are joints in the spine between the vertebrae.  Like any joints in the body, facets can become irritated and painful.  Arthritis can also effect the facets.  By injecting steroids and local anesthetic in and around these joints, we can temporarily block the nerve supply to them.  Steroids act directly on irritated nerves and tissues to reduce selling and inflammation which often leads to decreased pain.  Facet blocks may be done anywhere along the spine from the neck to the low back depending upon the location of your pain.   After numbing the skin with local anesthetic (like Novocaine), a small needle is passed onto the facet joints under x-ray guidance.  You may experience a sensation of pressure while this is being done.  The entire block usually lasts about 15-25 minutes.   Conditions which may be treated by facet blocks:   Low back/buttock pain  Neck/shoulder pain  Certain types of headaches  Preparation for the injection:  1. Do not eat any solid food or dairy products within 8 hours of your appointment. 2. You may drink clear liquid up to 3 hours before appointment.  Clear liquids include water, black coffee, juice or soda.  No milk or cream please. 3. You may take your regular medication, including pain medications, with a sip of water before your appointment.  Diabetics should hold regular insulin (if taken separately) and take 1/2 normal NPH dose the morning of the procedure.  Carry some sugar containing items with you to your appointment. 4. A driver must accompany you and be prepared to drive you home after your procedure. 5. Bring all your current medications with you. 6. An IV may be inserted and sedation may be given at the discretion of the physician. 7. A blood pressure cuff, EKG and other monitors will often be applied during the procedure.  Some patients may need to have extra oxygen administered for a short  period. 8. You will be asked to provide medical information, including your allergies and medications, prior to the procedure.  We must know immediately if you are taking blood thinners (like Coumadin/Warfarin) or if you are allergic to IV iodine contrast (dye).  We must know if you could possible be pregnant.  Possible side-effects:   Bleeding from needle site  Infection (rare, may require surgery)  Nerve injury (rare)  Numbness & tingling (temporary)  Difficulty urinating (rare, temporary)  Spinal headache (a headache worse with upright posture)  Light-headedness (temporary)  Pain at injection site (serveral days)  Decreased blood pressure (rare, temporary)  Weakness in arm/leg (temporary)  Pressure sensation in back/neck (temporary)   Call if you experience:   Fever/chills associated with headache or increased back/neck pain  Headache worsened by an upright position  New onset, weakness or numbness of an extremity below the injection site  Hives or difficulty breathing (go to the emergency room)  Inflammation or drainage at the injection site(s)  Severe back/neck pain greater than usual  New symptoms which are concerning to you  Please note:  Although the local anesthetic injected can often make your back or neck feel good for several hours after the injection, the pain will likely return. It takes 3-7 days for steroids to work.  You may not notice any pain relief for at least one week.  If effective, we will often do a series of 2-3 injections spaced 3-6 weeks apart to maximally decrease your pain.  After the initial   series, you may be a candidate for a more permanent nerve block of the facets.  If you have any questions, please call #[phone]) [facility]  General Discharge Instructions :  If you need to reach your doctor call: Monday-Friday 8:00 am - 4:00 pm at [phone] or  toll free [phone].  After clinic hours [phone] to have operator reach doctor.  Bring all of your medication bottles to all your appointments in the pain clinic.  To cancel or reschedule your appointment with Pain Management please remember to call 24 hours in advance to avoid a fee.  Refer to the educational materials which you have been given on: General Risks, I had my Procedure. Discharge Instructions, Post Sedation.  Post Procedure Instructions:  The drugs you were given will stay in your system until tomorrow, so for the next 24 hours you should not drive, make any legal decisions or drink any alcoholic beverages.  You may eat anything you prefer, but it is better to start with liquids then soups and crackers, and gradually work up to solid foods.  Please notify your doctor immediately if you have any unusual bleeding, trouble breathing or pain that is not related to your normal pain.  Depending on the type of procedure that was done, some parts of your body may feel week and/or numb.  This usually clears up by tonight or the next day.  Walk with the use of an assistive device or accompanied by an adult for the 24 hours.  You may use ice on the affected area for the first 24 hours.  Put ice in a Ziploc bag and cover with a towel and place against area 15 minutes on 15 minutes off.  You may switch to heat after 24 hours.GENERAL RISKS AND COMPLICATIONS  What are the risk, side effects and possible complications? Generally speaking, most procedures are safe.  However, with any procedure there are risks, side effects, and the possibility of complications.  The risks and complications are dependent upon the sites that are lesioned, or the type of nerve block to be performed.  The closer the procedure is to the spine, the more serious the risks are.  Great care is taken when placing the radio frequency needles, block needles or lesioning probes, but sometimes complications can  occur. 1. Infection: Any time there is an injection through the skin, there is a risk of infection.  This is why sterile conditions are used for these blocks.  There are four possible types of infection. 1. Localized skin infection. 2. Central Nervous System Infection-This can be in the form of Meningitis, which can be deadly. 3. Epidural Infections-This can be in the form of an epidural abscess, which can cause pressure inside of the spine, causing compression of the spinal cord with subsequent paralysis. This would require an emergency surgery to decompress, and there are no guarantees that the patient would recover from the paralysis. 4. Discitis-This is an infection of the intervertebral discs.  It occurs in about 1% of discography procedures.  It is difficult to treat and it may lead to surgery.        2. Pain: the needles have to go through skin and soft tissues, will cause soreness.       3. Damage to internal structures:  The nerves to be lesioned may be near blood vessels or    other nerves which can be potentially damaged.  4. Bleeding: Bleeding is more common if the patient is taking blood thinners such as  aspirin, Coumadin, Ticiid, Plavix, etc., or if he/she have some genetic predisposition  such as hemophilia. Bleeding into the spinal canal can cause compression of the spinal  cord with subsequent paralysis.  This would require an emergency surgery to  decompress and there are no guarantees that the patient would recover from the  paralysis.       5. Pneumothorax:  Puncturing of a lung is a possibility, every time a needle is introduced in  the area of the chest or upper back.  Pneumothorax refers to free air around the  collapsed lung(s), inside of the thoracic cavity (chest cavity).  Another two possible  complications related to a similar event would include: Hemothorax and Chylothorax.   These are variations of the Pneumothorax, where instead of air around the collapsed  lung(s),  you may have blood or chyle, respectively.       6. Spinal headaches: They may occur with any procedures in the area of the spine.       7. Persistent CSF (Cerebro-Spinal Fluid) leakage: This is a rare problem, but may occur  with prolonged intrathecal or epidural catheters either due to the formation of a fistulous  track or a dural tear.       8. Nerve damage: By working so close to the spinal cord, there is always a possibility of  nerve damage, which could be as serious as a permanent spinal cord injury with  paralysis.       9. Death:  Although rare, severe deadly allergic reactions known as "Anaphylactic  reaction" can occur to any of the medications used.      10. Worsening of the symptoms:  We can always make thing worse.  What are the chances of something like this happening? Chances of any of this occuring are extremely low.  By statistics, you have more of a chance of getting killed in a motor vehicle accident: while driving to the hospital than any of the above occurring .  Nevertheless, you should be aware that they are possibilities.  In general, it is similar to taking a shower.  Everybody knows that you can slip, hit your head and get killed.  Does that mean that you should not shower again?  Nevertheless always keep in mind that statistics do not mean anything if you happen to be on the wrong side of them.  Even if a procedure has a 1 (one) in a 1,000,000 (million) chance of going wrong, it you happen to be that one..Also, keep in mind that by statistics, you have more of a chance of having something go wrong when taking medications.  Who should not have this procedure? If you are on a blood thinning medication (e.g. Coumadin, Plavix, see list of "Blood Thinners"), or if you have an active infection going on, you should not have the procedure.  If you are taking any blood thinners, please inform your physician.  How should I prepare for this procedure?  Do not eat or drink anything at  least six hours prior to the procedure.  Bring a driver with you .  It cannot be a taxi.  Come accompanied by an adult that can drive you back, and that is strong enough to help you if your legs get weak or numb from the local anesthetic.  Take all of your medicines the morning of the procedure with just enough water  to swallow them.  If you have diabetes, make sure that you are scheduled to have your procedure done first thing in the morning, whenever possible.  If you have diabetes, take only half of your insulin dose and notify our nurse that you have done so as soon as you arrive at the clinic.  If you are diabetic, but only take blood sugar pills (oral hypoglycemic), then do not take them on the morning of your procedure.  You may take them after you have had the procedure.  Do not take aspirin or any aspirin-containing medications, at least eleven (11) days prior to the procedure.  They may prolong bleeding.  Wear loose fitting clothing that may be easy to take off and that you would not mind if it got stained with Betadine or blood.  Do not wear any jewelry or perfume  Remove any nail coloring.  It will interfere with some of our monitoring equipment.  NOTE: Remember that this is not meant to be interpreted as a complete list of all possible complications.  Unforeseen problems may occur.  BLOOD THINNERS The following drugs contain aspirin or other products, which can cause increased bleeding during surgery and should not be taken for 2 weeks prior to and 1 week after surgery.  If you should need take something for relief of minor pain, you may take acetaminophen which is found in Tylenol,m Datril, Anacin-3 and Panadol. It is not blood thinner. The products listed below are.  Do not take any of the products listed below in addition to any listed on your instruction sheet.  A.P.C or A.P.C with Codeine Codeine Phosphate Capsules #3 Ibuprofen Ridaura  ABC compound Congesprin Imuran  rimadil  Advil Cope Indocin Robaxisal  Alka-Seltzer Effervescent Pain Reliever and Antacid Coricidin or Coricidin-D  Indomethacin Rufen  Alka-Seltzer plus Cold Medicine Cosprin Ketoprofen S-A-C Tablets  Anacin Analgesic Tablets or Capsules Coumadin Korlgesic Salflex  Anacin Extra Strength Analgesic tablets or capsules CP-2 Tablets Lanoril Salicylate  Anaprox Cuprimine Capsules Levenox Salocol  Anexsia-D Dalteparin Magan Salsalate  Anodynos Darvon compound Magnesium Salicylate Sine-off  Ansaid Dasin Capsules Magsal Sodium Salicylate  Anturane Depen Capsules Marnal Soma  APF Arthritis pain formula Dewitt's Pills Measurin Stanback  Argesic Dia-Gesic Meclofenamic Sulfinpyrazone  Arthritis Bayer Timed Release Aspirin Diclofenac Meclomen Sulindac  Arthritis pain formula Anacin Dicumarol Medipren Supac  Analgesic (Safety coated) Arthralgen Diffunasal Mefanamic Suprofen  Arthritis Strength Bufferin Dihydrocodeine Mepro Compound Suprol  Arthropan liquid Dopirydamole Methcarbomol with Aspirin Synalgos  ASA tablets/Enseals Disalcid Micrainin Tagament  Ascriptin Doan's Midol Talwin  Ascriptin A/D Dolene Mobidin Tanderil  Ascriptin Extra Strength Dolobid Moblgesic Ticlid  Ascriptin with Codeine Doloprin or Doloprin with Codeine Momentum Tolectin  Asperbuf Duoprin Mono-gesic Trendar  Aspergum Duradyne Motrin or Motrin IB Triminicin  Aspirin plain, buffered or enteric coated Durasal Myochrisine Trigesic  Aspirin Suppositories Easprin Nalfon Trillsate  Aspirin with Codeine Ecotrin Regular or Extra Strength Naprosyn Uracel  Atromid-S Efficin Naproxen Ursinus  Auranofin Capsules Elmiron Neocylate Vanquish  Axotal Emagrin Norgesic Verin  Azathioprine Empirin or Empirin with Codeine Normiflo Vitamin E  Azolid Emprazil Nuprin Voltaren  Bayer Aspirin plain, buffered or children's or timed BC Tablets or powders Encaprin Orgaran Warfarin Sodium  Buff-a-Comp Enoxaparin Orudis Zorpin  Buff-a-Comp with  Codeine Equegesic Os-Cal-Gesic   Buffaprin Excedrin plain, buffered or Extra Strength Oxalid   Bufferin Arthritis Strength Feldene Oxphenbutazone   Bufferin plain or Extra Strength Feldene Capsules Oxycodone with Aspirin   Bufferin with Codeine Fenoprofen Fenoprofen Pabalate or Pabalate-SF  Buffets II Flogesic Panagesic   Buffinol plain or Extra Strength Florinal or Florinal with Codeine Panwarfarin   Buf-Tabs Flurbiprofen Penicillamine   Butalbital Compound Four-way cold tablets Penicillin   Butazolidin Fragmin Pepto-Bismol   Carbenicillin Geminisyn Percodan   Carna Arthritis Reliever Geopen Persantine   Carprofen Gold's salt Persistin   Chloramphenicol Goody's Phenylbutazone   Chloromycetin Haltrain Piroxlcam   Clmetidine heparin Plaquenil   Cllnoril Hyco-pap Ponstel   Clofibrate Hydroxy chloroquine Propoxyphen         Before stopping any of these medications, be sure to consult the physician who ordered them.  Some, such as Coumadin (Warfarin) are ordered to prevent or treat serious conditions such as "deep thrombosis", "pumonary embolisms", and other heart problems.  The amount of time that you may need off of the medication may also vary with the medication and the reason for which you were taking it.  If you are taking any of these medications, please make sure you notify your pain physician before you undergo any procedures.

## 2016-03-27 ENCOUNTER — Telehealth: Payer: Self-pay

## 2016-03-27 NOTE — Telephone Encounter (Signed)
Post procedure phone call.  Patient states he is doing alright.

## 2016-04-15 ENCOUNTER — Encounter: Payer: Self-pay | Admitting: Pain Medicine

## 2016-04-15 ENCOUNTER — Ambulatory Visit: Payer: 59 | Attending: Pain Medicine | Admitting: Pain Medicine

## 2016-04-15 VITALS — BP 142/101 | HR 77 | Temp 98.5°F | Resp 18 | Ht 74.0 in | Wt 275.0 lb

## 2016-04-15 DIAGNOSIS — E039 Hypothyroidism, unspecified: Secondary | ICD-10-CM | POA: Insufficient documentation

## 2016-04-15 DIAGNOSIS — N529 Male erectile dysfunction, unspecified: Secondary | ICD-10-CM | POA: Insufficient documentation

## 2016-04-15 DIAGNOSIS — N281 Cyst of kidney, acquired: Secondary | ICD-10-CM | POA: Insufficient documentation

## 2016-04-15 DIAGNOSIS — M533 Sacrococcygeal disorders, not elsewhere classified: Secondary | ICD-10-CM | POA: Insufficient documentation

## 2016-04-15 DIAGNOSIS — G4730 Sleep apnea, unspecified: Secondary | ICD-10-CM | POA: Diagnosis not present

## 2016-04-15 DIAGNOSIS — E860 Dehydration: Secondary | ICD-10-CM | POA: Insufficient documentation

## 2016-04-15 DIAGNOSIS — E669 Obesity, unspecified: Secondary | ICD-10-CM | POA: Diagnosis not present

## 2016-04-15 DIAGNOSIS — E785 Hyperlipidemia, unspecified: Secondary | ICD-10-CM | POA: Diagnosis not present

## 2016-04-15 DIAGNOSIS — Z6835 Body mass index (BMI) 35.0-35.9, adult: Secondary | ICD-10-CM | POA: Insufficient documentation

## 2016-04-15 DIAGNOSIS — R200 Anesthesia of skin: Secondary | ICD-10-CM | POA: Diagnosis not present

## 2016-04-15 DIAGNOSIS — E2749 Other adrenocortical insufficiency: Secondary | ICD-10-CM | POA: Diagnosis not present

## 2016-04-15 DIAGNOSIS — F419 Anxiety disorder, unspecified: Secondary | ICD-10-CM | POA: Diagnosis not present

## 2016-04-15 DIAGNOSIS — I10 Essential (primary) hypertension: Secondary | ICD-10-CM | POA: Diagnosis not present

## 2016-04-15 DIAGNOSIS — M545 Low back pain: Secondary | ICD-10-CM | POA: Diagnosis not present

## 2016-04-15 DIAGNOSIS — G4700 Insomnia, unspecified: Secondary | ICD-10-CM | POA: Insufficient documentation

## 2016-04-15 DIAGNOSIS — M5136 Other intervertebral disc degeneration, lumbar region: Secondary | ICD-10-CM | POA: Diagnosis not present

## 2016-04-15 DIAGNOSIS — M549 Dorsalgia, unspecified: Secondary | ICD-10-CM | POA: Diagnosis present

## 2016-04-15 DIAGNOSIS — M1611 Unilateral primary osteoarthritis, right hip: Secondary | ICD-10-CM | POA: Insufficient documentation

## 2016-04-15 DIAGNOSIS — M4726 Other spondylosis with radiculopathy, lumbar region: Secondary | ICD-10-CM | POA: Insufficient documentation

## 2016-04-15 DIAGNOSIS — M62830 Muscle spasm of back: Secondary | ICD-10-CM | POA: Insufficient documentation

## 2016-04-15 DIAGNOSIS — M79604 Pain in right leg: Secondary | ICD-10-CM | POA: Diagnosis present

## 2016-04-15 DIAGNOSIS — Z79891 Long term (current) use of opiate analgesic: Secondary | ICD-10-CM | POA: Insufficient documentation

## 2016-04-15 DIAGNOSIS — M47816 Spondylosis without myelopathy or radiculopathy, lumbar region: Secondary | ICD-10-CM

## 2016-04-15 DIAGNOSIS — G8929 Other chronic pain: Secondary | ICD-10-CM | POA: Insufficient documentation

## 2016-04-15 DIAGNOSIS — E559 Vitamin D deficiency, unspecified: Secondary | ICD-10-CM | POA: Diagnosis not present

## 2016-04-15 DIAGNOSIS — M5417 Radiculopathy, lumbosacral region: Secondary | ICD-10-CM | POA: Diagnosis not present

## 2016-04-15 DIAGNOSIS — M5441 Lumbago with sciatica, right side: Secondary | ICD-10-CM | POA: Insufficient documentation

## 2016-04-15 DIAGNOSIS — Z87891 Personal history of nicotine dependence: Secondary | ICD-10-CM | POA: Insufficient documentation

## 2016-04-15 DIAGNOSIS — R079 Chest pain, unspecified: Secondary | ICD-10-CM | POA: Insufficient documentation

## 2016-04-15 MED ORDER — CYCLOBENZAPRINE HCL 10 MG PO TABS: 10.0000 mg | ORAL_TABLET | Freq: Three times a day (TID) | ORAL | 5 refills | Status: DC | PRN

## 2016-04-15 NOTE — Progress Notes (Signed)
Safety precautions to be maintained throughout the outpatient stay will include: orient to surroundings, keep bed in low position, maintain call bell within reach at all times, provide assistance with transfer out of bed and ambulation.  
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
## 2016-05-03 ENCOUNTER — Ambulatory Visit (HOSPITAL_BASED_OUTPATIENT_CLINIC_OR_DEPARTMENT_OTHER): Payer: 59 | Attending: Cardiology | Admitting: Cardiology

## 2016-05-03 VITALS — Ht 74.0 in | Wt 275.0 lb

## 2016-05-03 DIAGNOSIS — G4733 Obstructive sleep apnea (adult) (pediatric): Secondary | ICD-10-CM | POA: Diagnosis not present

## 2016-05-03 DIAGNOSIS — I493 Ventricular premature depolarization: Secondary | ICD-10-CM | POA: Diagnosis not present

## 2016-05-06 NOTE — Procedures (Signed)
   Patient Name: Dean Ford, Dean Ford Date: 05/03/2016 Gender: Male D.O.B: [birth year] Age (years): 70 Referring Provider: Fransico Him MD, ABSM Height (inches): 74 Interpreting Physician: Fransico Him MD, ABSM Weight (lbs): 275 RPSGT: Zadie Rhine BMI: 35 MRN: [id number] Neck Size: 18.00  CLINICAL INFORMATION The patient is referred for a CPAP titration to treat sleep apnea.  SLEEP STUDY TECHNIQUE As per the AASM Manual for the Scoring of Sleep and Associated Events v2.3 (April 2016) with a hypopnea requiring 4% desaturations. The channels recorded and monitored were frontal, central and occipital EEG, electrooculogram (EOG), submentalis EMG (chin), nasal and oral airflow, thoracic and abdominal wall motion, anterior tibialis EMG, snore microphone, electrocardiogram, and pulse oximetry. Continuous positive airway pressure (CPAP) was initiated at the beginning of the study and titrated to treat sleep-disordered breathing.  MEDICATIONS Medications taken by the patient : FLEXERIL, TRAMADOL  Medications administered by patient during sleep study : No sleep medicine administered.  TECHNICIAN COMMENTS Comments added by technician: Patient had difficulty initiating sleep.  Comments added by scorer: N/A  RESPIRATORY PARAMETERS Optimal PAP Pressure (cm): N/A  AHI at Optimal Pressure (/hr):N/A Overall Minimal O2 (%):N/A  Supine % at Optimal Pressure (%):N/A Minimal O2 at Optimal Pressure (%): 88.00      SLEEP ARCHITECTURE The study was initiated at 9:41:36 PM and ended at 11:45:03 PM. Sleep onset time was N/A minutes and the sleep efficiency was 0.0%. The total sleep time was 0.0 minutes. The patient spent N/A% of the night in stage N1 sleep, N/A% in stage N2 sleep, N/A% in stage N3 and N/A% in REM.Stage REM latency was N/A minutes Wake after sleep onset was 0.0. Alpha intrusion was absent. Supine sleep was N/A%.  CARDIAC DATA The 2 lead EKG demonstrated sinus rhythm. The mean heart  rate was N/A beats per minute. Other EKG findings include: PVCs.  LEG MOVEMENT DATA The total Periodic Limb Movements of Sleep (PLMS) were N/A. The PLMS index was N/A. A PLMS index of <15 is considered normal in adults.  IMPRESSIONS - An optimal PAP pressure could not be selected for this patient based on the available study data. - Mild oxygen desaturations were observed during this titration. - No snoring was audible during this study. - PVCs were observed during this study.  DIAGNOSIS - Obstructive Sleep Apnea (327.23 [G47.33 ICD-10]) - PVCs  RECOMMENDATIONS - Recommend a trial of Auto-CPAP 5-18 cm H2O. . - Avoid alcohol, sedatives and other CNS depressants that may worsen sleep apnea and disrupt normal sleep architecture. - Sleep hygiene should be reviewed to assess factors that may improve sleep quality. - Weight management and regular exercise should be initiated or continued. - Return to Sleep Center for re-evaluation after 10 weeks of therapy   [place], [place] [facility]  ELECTRONICALLY SIGNED ON:  05/06/2016, 9:17 PM [place] PH: [phone]   FX: [phone] [place]

## 2016-05-07 ENCOUNTER — Encounter: Payer: Self-pay | Admitting: Pain Medicine

## 2016-05-07 ENCOUNTER — Ambulatory Visit
Admission: RE | Admit: 2016-05-07 | Discharge: 2016-05-07 | Disposition: A | Discharge status: Home / Self Care | Payer: 59 | Source: Ambulatory Visit | Attending: Pain Medicine | Admitting: Pain Medicine

## 2016-05-07 ENCOUNTER — Telehealth: Payer: Self-pay | Admitting: *Deleted

## 2016-05-07 ENCOUNTER — Ambulatory Visit (HOSPITAL_BASED_OUTPATIENT_CLINIC_OR_DEPARTMENT_OTHER): Payer: 59 | Admitting: Pain Medicine

## 2016-05-07 VITALS — BP 126/96 | HR 91 | Temp 98.3°F | Resp 14 | Ht 74.0 in | Wt 275.0 lb

## 2016-05-07 DIAGNOSIS — M533 Sacrococcygeal disorders, not elsewhere classified: Secondary | ICD-10-CM | POA: Diagnosis not present

## 2016-05-07 DIAGNOSIS — M47816 Spondylosis without myelopathy or radiculopathy, lumbar region: Secondary | ICD-10-CM

## 2016-05-07 DIAGNOSIS — E785 Hyperlipidemia, unspecified: Secondary | ICD-10-CM | POA: Diagnosis not present

## 2016-05-07 DIAGNOSIS — I10 Essential (primary) hypertension: Secondary | ICD-10-CM | POA: Diagnosis not present

## 2016-05-07 DIAGNOSIS — M5126 Other intervertebral disc displacement, lumbar region: Secondary | ICD-10-CM | POA: Diagnosis not present

## 2016-05-07 DIAGNOSIS — M5417 Radiculopathy, lumbosacral region: Secondary | ICD-10-CM | POA: Diagnosis not present

## 2016-05-07 DIAGNOSIS — E039 Hypothyroidism, unspecified: Secondary | ICD-10-CM | POA: Diagnosis not present

## 2016-05-07 DIAGNOSIS — M545 Low back pain: Secondary | ICD-10-CM

## 2016-05-07 DIAGNOSIS — N529 Male erectile dysfunction, unspecified: Secondary | ICD-10-CM | POA: Insufficient documentation

## 2016-05-07 DIAGNOSIS — G8929 Other chronic pain: Secondary | ICD-10-CM | POA: Insufficient documentation

## 2016-05-07 DIAGNOSIS — G4730 Sleep apnea, unspecified: Secondary | ICD-10-CM | POA: Insufficient documentation

## 2016-05-07 DIAGNOSIS — F1120 Opioid dependence, uncomplicated: Secondary | ICD-10-CM | POA: Insufficient documentation

## 2016-05-07 DIAGNOSIS — M1610 Unilateral primary osteoarthritis, unspecified hip: Secondary | ICD-10-CM | POA: Insufficient documentation

## 2016-05-07 DIAGNOSIS — E559 Vitamin D deficiency, unspecified: Secondary | ICD-10-CM | POA: Insufficient documentation

## 2016-05-07 DIAGNOSIS — M47896 Other spondylosis, lumbar region: Secondary | ICD-10-CM | POA: Insufficient documentation

## 2016-05-07 DIAGNOSIS — R200 Anesthesia of skin: Secondary | ICD-10-CM | POA: Insufficient documentation

## 2016-05-07 DIAGNOSIS — R0789 Other chest pain: Secondary | ICD-10-CM | POA: Insufficient documentation

## 2016-05-07 DIAGNOSIS — M5441 Lumbago with sciatica, right side: Secondary | ICD-10-CM | POA: Insufficient documentation

## 2016-05-07 DIAGNOSIS — E669 Obesity, unspecified: Secondary | ICD-10-CM | POA: Insufficient documentation

## 2016-05-07 MED ORDER — LACTATED RINGERS IV SOLN: 1000.0000 mL | Freq: Once | INTRAVENOUS | Status: DC

## 2016-05-07 MED ORDER — ROPIVACAINE HCL 2 MG/ML IJ SOLN
9.0000 mL | Freq: Once | INTRAMUSCULAR | Status: DC
  Filled 2016-05-07: qty 10

## 2016-05-07 MED ORDER — FENTANYL CITRATE (PF) 100 MCG/2ML IJ SOLN
25.0000 ug | INTRAMUSCULAR | Status: DC | PRN
  Filled 2016-05-07: qty 2

## 2016-05-07 MED ORDER — TRIAMCINOLONE ACETONIDE 40 MG/ML IJ SUSP
40.0000 mg | Freq: Once | INTRAMUSCULAR | Status: DC
  Filled 2016-05-07: qty 1

## 2016-05-07 MED ORDER — ROPIVACAINE HCL 2 MG/ML IJ SOLN
9.0000 mL | Freq: Once | INTRAMUSCULAR | Status: AC
  Administered 2016-05-07: 9 mL

## 2016-05-07 MED ORDER — LIDOCAINE HCL (PF) 1 % IJ SOLN
10.0000 mL | Freq: Once | INTRAMUSCULAR | Status: AC
  Administered 2016-05-07: 10 mL

## 2016-05-07 MED ORDER — TRIAMCINOLONE ACETONIDE 40 MG/ML IJ SUSP
40.0000 mg | Freq: Once | INTRAMUSCULAR | Status: AC
  Administered 2016-05-07: 40 mg

## 2016-05-07 MED ORDER — MIDAZOLAM HCL 5 MG/5ML IJ SOLN
1.0000 mg | INTRAMUSCULAR | Status: DC | PRN
  Filled 2016-05-07: qty 5

## 2016-05-07 NOTE — Progress Notes (Signed)
1447 Versed 2 mg IV, Fentanyl 50 mcg IV 1450 Versed 1 mg IV, Fentanyl 50 mcg IV

## 2016-05-07 NOTE — Telephone Encounter (Signed)
Patient states that he was given a 90 day supply of his BP medication and there were some days that he has had to take an extra tablet and now he is out and the pharmacy said that it was too soon for a refill.   They recommended that he call his cardiologist for suggestions and to start getting enough tablets to take 2 a day if needed.

## 2016-05-07 NOTE — Progress Notes (Signed)
Patient's Name: Dean Ford  MRN: [id number]  Referring Provider: Milinda Pointer, MD  DOB: 04/02/1976  PCP: Keith Rake, MD  DOS: 05/07/2016  Note by: Kathlen Brunswick. Dossie Arbour, MD  Service setting: Ambulatory outpatient  Location: [facility] (AMB) Pain Management Facility  Visit type: Procedure  Specialty: Interventional Pain Management  Patient type: Established   Primary Reason for Visit: Interventional Pain Management Treatment. CC: Back Pain (lower right back) and Leg Pain (right leg, numbness and tingling right leg)  Procedure:  Anesthesia, Analgesia, Anxiolysis:  Type: Diagnostic Medial Branch Facet Block Region: Lumbar Level: L2, L3, L4, L5, & S1 Medial Branch Level(s) Laterality: Right    Type: Moderate (Conscious) Sedation & Local Anesthesia Local Anesthetic: Lidocaine 1% Route: Intravenous (IV) IV Access: Secured Sedation: Meaningful verbal contact was maintained at all times during the procedure  Indication(s): Analgesia & Anxiolysis   Indications: 1. Lumbar facet hypertrophy   2. Facet syndrome, lumbar   3. Chronic low back pain (Location of Primary Source of Pain) (R>L)    Pre-procedure Pain Score: 5/10 Post-procedure Pain Score: 0-No pain/10  Pre-Procedure Assessment:  [name] is a 40 y.o. year old, male patient, seen today for interventional treatment. 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[place]); Anxiety disorder; Dehydration symptoms; Dyslipidemia; Encounter for therapeutic drug level monitoring; Long term current use of opiate analgesic; Uncomplicated opioid dependence ([place]); Opiate use; Lumbar spondylosis; Lumbar facet syndrome (Bilateral) (R>L); Chronic low back pain (Location of Primary Source of Pain) (R>L); Insomnia, persistent; BP (high blood pressure); Adult hypothyroidism; Intermittent muscle cramps; Always thirsty; Avitaminosis D; Lumbar spine pain; Hip arthrosis; Obesity; DDD (degenerative disc disease), lumbar; L4-L5 disc  bulge; Ligamentum flavum hypertrophy (HCC); Bulging lumbar disc; Sleep apnea; Chronic sacroiliac joint pain (Right); Lumbar facet hypertrophy; Lumbosacral radiculopathy at S1 (right-sided); Chronic pain; Long term prescription opiate use; Encounter for chronic pain management; Chronic bilateral low back pain with right-sided sciatica; Erectile dysfunction; Spasm of paraspinal muscle; Lower extremity numbness (Bilateral) (R>L); Chronic lumbar radicular pain (right S1 and bilateral L5) (Location of Secondary source of pain) (Bilateral) (R>L); Chronic hip pain (Right); and Osteoarthritis of hip (Right) on his problem list.. His primarily concern today is the Back Pain (lower right back) and Leg Pain (right leg, numbness and tingling right leg)   Pain Type: Chronic pain Pain Location: Back Pain Orientation: Lower Pain Descriptors / Indicators: Aching, Constant, Radiating Pain Frequency: Constant  Date of Last Visit: 04/15/16 Service Provided on Last Visit: Evaluation (bilateral lumbar facet)  Coagulation Parameters Lab Results  Component Value Date   PLT 238 03/01/2015    Verification of the correct person, correct site (including marking of site), and correct procedure were performed and confirmed by the patient.  Consent: Secured. Under the influence of no sedatives a written informed consent was obtained, after having provided information on the risks and possible complications. To fulfill our ethical and legal obligations, as recommended by the American Medical Association's Code of Ethics, we have provided information to the patient about our clinical impression; the nature and purpose of the treatment or procedure; the risks, benefits, and possible complications of the intervention; alternatives; the risk(s) and benefit(s) of the alternative treatment(s) or procedure(s); and the risk(s) and benefit(s) of doing nothing. The patient was provided information about the risks and possible  complications associated with the procedure. These include, but are not limited to, failure to achieve desired goals, infection, bleeding, organ or nerve damage, allergic reactions, paralysis, and death. In the case of spinal  procedures these may include, but are not limited to, failure to achieve desired goals, infection, bleeding, organ or nerve damage, allergic reactions, paralysis, and death. In addition, the patient was informed that Medicine is not an exact science; therefore, there is also the possibility of unforeseen risks and possible complications that may result in a catastrophic outcome. The patient indicated having understood very clearly. We have given the patient no guarantees and we have made no promises. Enough time was given to the patient to ask questions, all of which were answered to the patient's satisfaction.  Consent Attestation: I, the ordering provider, attest that I have discussed with the patient the benefits, risks, side-effects, alternatives, likelihood of achieving goals, and potential problems during recovery for the procedure that I have provided informed consent.  Pre-Procedure Preparation: Safety Precautions: Allergies reviewed. Appropriate site, procedure, and patient were confirmed by following the Joint Commission's Universal Protocol (UP.01.01.01), in the form of a "Time Out". The patient was asked to confirm marked site and procedure, before commencing. The patient was asked about blood thinners, or active infections, both of which were denied. Patient was assessed for positional comfort and all pressure points were checked before starting procedure. Infection Control Precautions: Sterile technique used. Standard Universal Precautions were taken as recommended by the Department of [facility] for Disease Control and Prevention (CDC). Standard pre-surgical skin prep was conducted. Respiratory hygiene and cough etiquette was practiced. Hand hygiene observed.  Safe injection practices and needle disposal techniques followed. SDV (single dose vial) medications used. Medications properly checked for expiration dates and contaminants. Personal protective equipment (PPE) used: Surgical mask. Sterile Radiation-resistant gloves. Monitoring:  As per clinic protocol. Vitals:   05/07/16 1451 05/07/16 1455 05/07/16 1501 05/07/16 1511  BP: (!) 144/106 (!) 147/95 137/82 (!) 126/96  Pulse: 87 87 88 91  Resp: _0 Temp:      TempSrc:      SpO2: 95% 92%    Weight:      Height:      Calculated BMI: Body mass index is 35.31 kg/m. Allergies: He is allergic to borax.. Allergy Precautions: None required  Description of Procedure Process:   Time-out: "Time-out" completed before starting procedure, as per protocol. Position: Prone Target Area: For Lumbar Facet blocks, the target is the groove formed by the junction of the transverse process and superior articular process. For the L5 dorsal ramus, the target is the notch between superior articular process and sacral ala. For the S1 dorsal ramus, the target is the superior and lateral edge of the posterior S1 Sacral foramen. Approach: Paramedial approach. Area Prepped: Entire Posterior Lumbosacral Region Prepping solution: ChloraPrep (2% chlorhexidine gluconate and 70% isopropyl alcohol) Safety Precautions: Aspiration looking for blood return was conducted prior to all injections. At no point did we inject any substances, as a needle was being advanced. No attempts were made at seeking any paresthesias. Safe injection practices and needle disposal techniques used. Medications properly checked for expiration dates. SDV (single dose vial) medications used. Description of the Procedure: Protocol guidelines were followed. The patient was placed in position over the fluoroscopy table. The target area was identified and the area prepped in the usual manner. Skin desensitized using vapocoolant spray. Skin & deeper  tissues infiltrated with local anesthetic. Appropriate amount of time allowed to pass for local anesthetics to take effect. The procedure needle was introduced through the skin, ipsilateral to the reported pain, and advanced to the target area. Employing the "Medial Branch Technique",  the needles were advanced to the angle made by the superior and medial portion of the transverse process, and the lateral and inferior portion of the superior articulating process of the targeted vertebral bodies. This area is known as "Burton's Eye" or the "Eye of the [place] Dog". A procedure needle was introduced through the skin, and this time advanced to the angle made by the superior and medial border of the sacral ala, and the lateral border of the S1 vertebral body. This last needle was later repositioned at the superior and lateral border of the posterior S1 foramen. Negative aspiration confirmed. Solution injected in intermittent fashion, asking for systemic symptoms every 0.5cc of injectate. The needles were then removed and the area cleansed, making sure to leave some of the prepping solution back to take advantage of its long term bactericidal properties. EBL: None Materials & Medications Used:  Needle(s) Used: 22g - 3.5" Spinal Needle(s)  Imaging Guidance:   Type of Imaging Technique: Fluoroscopy Guidance (Spinal) Indication(s): Assistance in needle guidance and placement for procedures requiring needle placement in or near specific anatomical locations not easily accessible without such assistance. Exposure Time: Please see nurses notes. Contrast: None required. Fluoroscopic Guidance: I was personally present in the fluoroscopy suite, where the patient was placed in position for the procedure, over the fluoroscopy-compatible table. Fluoroscopy was manipulated, using "Tunnel Vision Technique", to obtain the best possible view of the target area, on the affected side. Parallax error was corrected before  commencing the procedure. A "direction-depth-direction" technique was used to introduce the needle under continuous pulsed fluoroscopic guidance. Once the target was reached, antero-posterior, oblique, and lateral fluoroscopic projection views were taken to confirm needle placement in all planes. Permanently recorded images stored by scanning into EMR. Interpretation: Intraoperative imaging interpretation by performing Physician. Adequate needle placement confirmed. Adequate needle placement confirmed in AP, lateral, & Oblique Views. No contrast injected.  Antibiotic Prophylaxis:  Indication(s): No indications identified. Type:  Antibiotics Given (last 72 hours)    None       Post-operative Assessment:   Complications: No immediate post-treatment complications were observed. Disposition: Return to clinic for follow-up evaluation. The patient tolerated the entire procedure well. A repeat set of vitals were taken after the procedure and the patient was kept under observation following institutional policy, for this procedure. Post-procedural neurological assessment was performed, showing return to baseline, prior to discharge. The patient was discharged home, once institutional criteria were met. The patient was provided with post-procedure discharge instructions, including a section on how to identify potential problems. Should any problems arise concerning this procedure, the patient was given instructions to immediately contact us, at any time, without hesitation. In any case, we plan to contact the patient by telephone for a follow-up status report regarding this interventional procedure. Comments:  No additional relevant information.  Plan of Care   Problem List Items Addressed This Visit      High   Chronic low back pain (Location of Primary Source of Pain) (R>L) (Chronic)   Relevant Medications   fentaNYL (SUBLIMAZE) injection 25-50 mcg   triamcinolone acetonide (KENALOG-40) injection 40  mg   triamcinolone acetonide (KENALOG-40) injection 40 mg (Completed)   Lumbar facet hypertrophy - Primary (Chronic)   Relevant Medications   fentaNYL (SUBLIMAZE) injection 25-50 mcg   lactated ringers infusion 1,000 mL   midazolam (VERSED) 5 MG/5ML injection 1-2 mg   triamcinolone acetonide (KENALOG-40) injection 40 mg   lidocaine (PF) (XYLOCAINE) 1 % injection 10 mL (Completed)  ropivacaine (PF) 2 mg/ml (0.2%) (NAROPIN) epidural 9 mL   ropivacaine (PF) 2 mg/ml (0.2%) (NAROPIN) epidural 9 mL (Completed)   triamcinolone acetonide (KENALOG-40) injection 40 mg (Completed)   Other Relevant Orders   DG C-Arm 1-60 Min-No Report (Completed)   Lumbar facet syndrome (Bilateral) (R>L) (Chronic)   Relevant Medications   fentaNYL (SUBLIMAZE) injection 25-50 mcg   triamcinolone acetonide (KENALOG-40) injection 40 mg   triamcinolone acetonide (KENALOG-40) injection 40 mg (Completed)    Other Visit Diagnoses   None.     Requested PM Follow-up: Return in 2 weeks (on 05/21/2016) for Post-Procedure evaluation.  Future Appointments Date Time Provider [place]  06/10/2016 1:00 PM Milinda Pointer, MD [facility] None  06/18/2016 10:40 AM Milinda Pointer, MD [facility] None  06/29/2016 8:00 AM Milinda Pointer, MD [facility] None    Primary Care Physician: Keith Rake, MD Location: [facility] Note by: Kathlen Brunswick. Dossie Arbour, M.D, DABA, DABAPM, DABPM, DABIPP, FIPP   Illustration of the posterior view of the lumbar spine and the posterior neural structures. Laminae of L2 through S1 are labeled. DPRL5, dorsal primary ramus of L5; DPRS1, dorsal primary ramus of S1; DPR3, dorsal primary ramus of L3; FJ, facet (zygapophyseal) joint L3-L4; I, inferior articular process of L4; LB1, lateral branch of dorsal primary ramus of L1; IAB, inferior articular branches from L3 medial branch (supplies L4-L5 facet joint); IBP, intermediate branch plexus; MB3, medial branch of dorsal  primary ramus of L3; NR3, third lumbar nerve root; S, superior articular process of L5; SAB, superior articular branches from L4 (supplies L4-5 facet joint also); TP3, transverse process of L3.  Disclaimer:  Medicine is not an Chief Strategy Officer. The only guarantee in medicine is that nothing is guaranteed. It is important to note that the decision to proceed with this intervention was based on the information collected from the patient. The Data and conclusions were drawn from the patient's questionnaire, the interview, and the physical examination. Because the information was provided in large part by the patient, it cannot be guaranteed that it has not been purposely or unconsciously manipulated. Every effort has been made to obtain as much relevant data as possible for this evaluation. It is important to note that the conclusions that lead to this procedure are derived in large part from the available data. Always take into account that the treatment will also be dependent on availability of resources and existing treatment guidelines, considered by other Pain Management Practitioners as being common knowledge and practice, at the time of the intervention. For Medico-Legal purposes, it is also important to point out that variation in procedural techniques and pharmacological choices are the acceptable norm. The indications, contraindications, technique, and results of the above procedure should only be interpreted and judged by a Board-Certified Interventional Pain Specialist with extensive familiarity and expertise in the same exact procedure and technique. Attempts at providing opinions without similar or greater experience and expertise than that of the treating physician will be considered as inappropriate and unethical, and shall result in a formal complaint to the state medical board and applicable specialty societies.

## 2016-05-07 NOTE — Progress Notes (Signed)
Patient has been informed of home auto-titration plans and he is okay to proceed with this.    Message has been sent to [facility].

## 2016-05-07 NOTE — Progress Notes (Signed)
Safety precautions to be maintained throughout the outpatient stay will include: orient to surroundings, keep bed in low position, maintain call bell within reach at all times, provide assistance with transfer out of bed and ambulation.  

## 2016-05-07 NOTE — Telephone Encounter (Signed)
Left message for patient to call back regarding medication.  Patient should not be taking additional losartan daily as 100 mg is maximum daily dose.  Per Dr. Acie Fredrickson, if he is having issues with high blood pressure, he should come in for an office visit and lab work.

## 2016-05-07 NOTE — Patient Instructions (Signed)
Pain Management Discharge Instructions  General Discharge Instructions :  If you need to reach your doctor call: Monday-Friday 8:00 am - 4:00 pm at [phone] or toll free [phone].  After clinic hours [phone] to have operator reach doctor.  Bring all of your medication bottles to all your appointments in the pain clinic.  To cancel or reschedule your appointment with Pain Management please remember to call 24 hours in advance to avoid a fee.  Refer to the educational materials which you have been given on: General Risks, I had my Procedure. Discharge Instructions, Post Sedation.  Post Procedure Instructions:  The drugs you were given will stay in your system until tomorrow, so for the next 24 hours you should not drive, make any legal decisions or drink any alcoholic beverages.  You may eat anything you prefer, but it is better to start with liquids then soups and crackers, and gradually work up to solid foods.  Please notify your doctor immediately if you have any unusual bleeding, trouble breathing or pain that is not related to your normal pain.  Depending on the type of procedure that was done, some parts of your body may feel week and/or numb.  This usually clears up by tonight or the next day.  Walk with the use of an assistive device or accompanied by an adult for the 24 hours.  You may use ice on the affected area for the first 24 hours.  Put ice in a Ziploc bag and cover with a towel and place against area 15 minutes on 15 minutes off.  You may switch to heat after 24 hours.GENERAL RISKS AND COMPLICATIONS  What are the risk, side effects and possible complications? Generally speaking, most procedures are safe.  However, with any procedure there are risks, side effects, and the possibility of complications.  The risks and complications are dependent upon the sites that are lesioned, or the type of nerve block to be performed.  The closer the procedure is to the spine,  the more serious the risks are.  Great care is taken when placing the radio frequency needles, block needles or lesioning probes, but sometimes complications can occur. 1. Infection: Any time there is an injection through the skin, there is a risk of infection.  This is why sterile conditions are used for these blocks.  There are four possible types of infection. 1. Localized skin infection. 2. Central Nervous System Infection-This can be in the form of Meningitis, which can be deadly. 3. Epidural Infections-This can be in the form of an epidural abscess, which can cause pressure inside of the spine, causing compression of the spinal cord with subsequent paralysis. This would require an emergency surgery to decompress, and there are no guarantees that the patient would recover from the paralysis. 4. Discitis-This is an infection of the intervertebral discs.  It occurs in about 1% of discography procedures.  It is difficult to treat and it may lead to surgery.        2. Pain: the needles have to go through skin and soft tissues, will cause soreness.       3. Damage to internal structures:  The nerves to be lesioned may be near blood vessels or    other nerves which can be potentially damaged.       4. Bleeding: Bleeding is more common if the patient is taking blood thinners such as  aspirin, Coumadin, Ticiid, Plavix, etc., or if he/she have some genetic predisposition  such as   hemophilia. Bleeding into the spinal canal can cause compression of the spinal  cord with subsequent paralysis.  This would require an emergency surgery to  decompress and there are no guarantees that the patient would recover from the  paralysis.       5. Pneumothorax:  Puncturing of a lung is a possibility, every time a needle is introduced in  the area of the chest or upper back.  Pneumothorax refers to free air around the  collapsed lung(s), inside of the thoracic cavity (chest cavity).  Another two possible  complications  related to a similar event would include: Hemothorax and Chylothorax.   These are variations of the Pneumothorax, where instead of air around the collapsed  lung(s), you may have blood or chyle, respectively.       6. Spinal headaches: They may occur with any procedures in the area of the spine.       7. Persistent CSF (Cerebro-Spinal Fluid) leakage: This is a rare problem, but may occur  with prolonged intrathecal or epidural catheters either due to the formation of a fistulous  track or a dural tear.       8. Nerve damage: By working so close to the spinal cord, there is always a possibility of  nerve damage, which could be as serious as a permanent spinal cord injury with  paralysis.       9. Death:  Although rare, severe deadly allergic reactions known as "Anaphylactic  reaction" can occur to any of the medications used.      10. Worsening of the symptoms:  We can always make thing worse.  What are the chances of something like this happening? Chances of any of this occuring are extremely low.  By statistics, you have more of a chance of getting killed in a motor vehicle accident: while driving to the hospital than any of the above occurring .  Nevertheless, you should be aware that they are possibilities.  In general, it is similar to taking a shower.  Everybody knows that you can slip, hit your head and get killed.  Does that mean that you should not shower again?  Nevertheless always keep in mind that statistics do not mean anything if you happen to be on the wrong side of them.  Even if a procedure has a 1 (one) in a 1,000,000 (million) chance of going wrong, it you happen to be that one..Also, keep in mind that by statistics, you have more of a chance of having something go wrong when taking medications.  Who should not have this procedure? If you are on a blood thinning medication (e.g. Coumadin, Plavix, see list of "Blood Thinners"), or if you have an active infection going on, you should not  have the procedure.  If you are taking any blood thinners, please inform your physician.  How should I prepare for this procedure?  Do not eat or drink anything at least six hours prior to the procedure.  Bring a driver with you .  It cannot be a taxi.  Come accompanied by an adult that can drive you back, and that is strong enough to help you if your legs get weak or numb from the local anesthetic.  Take all of your medicines the morning of the procedure with just enough water to swallow them.  If you have diabetes, make sure that you are scheduled to have your procedure done first thing in the morning, whenever possible.  If you have diabetes,   take only half of your insulin dose and notify our nurse that you have done so as soon as you arrive at the clinic.  If you are diabetic, but only take blood sugar pills (oral hypoglycemic), then do not take them on the morning of your procedure.  You may take them after you have had the procedure.  Do not take aspirin or any aspirin-containing medications, at least eleven (11) days prior to the procedure.  They may prolong bleeding.  Wear loose fitting clothing that may be easy to take off and that you would not mind if it got stained with Betadine or blood.  Do not wear any jewelry or perfume  Remove any nail coloring.  It will interfere with some of our monitoring equipment.  NOTE: Remember that this is not meant to be interpreted as a complete list of all possible complications.  Unforeseen problems may occur.  BLOOD THINNERS The following drugs contain aspirin or other products, which can cause increased bleeding during surgery and should not be taken for 2 weeks prior to and 1 week after surgery.  If you should need take something for relief of minor pain, you may take acetaminophen which is found in Tylenol,m Datril, Anacin-3 and Panadol. It is not blood thinner. The products listed below are.  Do not take any of the products listed below  in addition to any listed on your instruction sheet.  A.P.C or A.P.C with Codeine Codeine Phosphate Capsules #3 Ibuprofen Ridaura  ABC compound Congesprin Imuran rimadil  Advil Cope Indocin Robaxisal  Alka-Seltzer Effervescent Pain Reliever and Antacid Coricidin or Coricidin-D  Indomethacin Rufen  Alka-Seltzer plus Cold Medicine Cosprin Ketoprofen S-A-C Tablets  Anacin Analgesic Tablets or Capsules Coumadin Korlgesic Salflex  Anacin Extra Strength Analgesic tablets or capsules CP-2 Tablets Lanoril Salicylate  Anaprox Cuprimine Capsules Levenox Salocol  Anexsia-D Dalteparin Magan Salsalate  Anodynos Darvon compound Magnesium Salicylate Sine-off  Ansaid Dasin Capsules Magsal Sodium Salicylate  Anturane Depen Capsules Marnal Soma  APF Arthritis pain formula Dewitt's Pills Measurin Stanback  Argesic Dia-Gesic Meclofenamic Sulfinpyrazone  Arthritis Bayer Timed Release Aspirin Diclofenac Meclomen Sulindac  Arthritis pain formula Anacin Dicumarol Medipren Supac  Analgesic (Safety coated) Arthralgen Diffunasal Mefanamic Suprofen  Arthritis Strength Bufferin Dihydrocodeine Mepro Compound Suprol  Arthropan liquid Dopirydamole Methcarbomol with Aspirin Synalgos  ASA tablets/Enseals Disalcid Micrainin Tagament  Ascriptin Doan's Midol Talwin  Ascriptin A/D Dolene Mobidin Tanderil  Ascriptin Extra Strength Dolobid Moblgesic Ticlid  Ascriptin with Codeine Doloprin or Doloprin with Codeine Momentum Tolectin  Asperbuf Duoprin Mono-gesic Trendar  Aspergum Duradyne Motrin or Motrin IB Triminicin  Aspirin plain, buffered or enteric coated Durasal Myochrisine Trigesic  Aspirin Suppositories Easprin Nalfon Trillsate  Aspirin with Codeine Ecotrin Regular or Extra Strength Naprosyn Uracel  Atromid-S Efficin Naproxen Ursinus  Auranofin Capsules Elmiron Neocylate Vanquish  Axotal Emagrin Norgesic Verin  Azathioprine Empirin or Empirin with Codeine Normiflo Vitamin E  Azolid Emprazil Nuprin Voltaren  Bayer  Aspirin plain, buffered or children's or timed BC Tablets or powders Encaprin Orgaran Warfarin Sodium  Buff-a-Comp Enoxaparin Orudis Zorpin  Buff-a-Comp with Codeine Equegesic Os-Cal-Gesic   Buffaprin Excedrin plain, buffered or Extra Strength Oxalid   Bufferin Arthritis Strength Feldene Oxphenbutazone   Bufferin plain or Extra Strength Feldene Capsules Oxycodone with Aspirin   Bufferin with Codeine Fenoprofen Fenoprofen Pabalate or Pabalate-SF   Buffets II Flogesic Panagesic   Buffinol plain or Extra Strength Florinal or Florinal with Codeine Panwarfarin   Buf-Tabs Flurbiprofen Penicillamine   Butalbital Compound Four-way cold tablets   Penicillin   Butazolidin Fragmin Pepto-Bismol   Carbenicillin Geminisyn Percodan   Carna Arthritis Reliever Geopen Persantine   Carprofen Gold's salt Persistin   Chloramphenicol Goody's Phenylbutazone   Chloromycetin Haltrain Piroxlcam   Clmetidine heparin Plaquenil   Cllnoril Hyco-pap Ponstel   Clofibrate Hydroxy chloroquine Propoxyphen         Before stopping any of these medications, be sure to consult the physician who ordered them.  Some, such as Coumadin (Warfarin) are ordered to prevent or treat serious conditions such as "deep thrombosis", "pumonary embolisms", and other heart problems.  The amount of time that you may need off of the medication may also vary with the medication and the reason for which you were taking it.  If you are taking any of these medications, please make sure you notify your pain physician before you undergo any procedures.         Facet Joint Block, Care After Refer to this sheet in the next few weeks. These instructions provide you with information on caring for yourself after your procedure. Your health care provider may also give you more specific instructions. Your treatment has been planned according to current medical practices, but problems sometimes occur. Call your health care provider if you have any problems  or questions after your procedure. HOME CARE INSTRUCTIONS   Keep track of the amount of pain relief you feel and how long it lasts.  Limit pain medicine within the first 4-6 hours after the procedure as directed by your health care provider.  Resume taking dietary supplements and medicines as directed by your health care provider.  You may resume your regular diet.  Do not apply heat near or over the injection site(s) for 24 hours.   Do not take a bath or soak in water (such as a pool or lake) for 24 hours.  Do not drive for 24 hours unless approved by your health care provider.  Avoid strenuous activity for 24 hours.  Remove your bandages the morning after the procedure.   If the injection site is tender, applying an ice pack may relieve some tenderness. To do this:  Put ice in a bag.  Place a towel between your skin and the bag.  Leave the ice on for 15-20 minutes, 3-4 times a day.  Keep follow-up appointments as directed by your health care provider. SEEK MEDICAL CARE IF:   Your pain is not controlled by your medicines.   There is drainage from the injection site.   There is significant bleeding or swelling at the injection site.  You have diabetes and your blood sugar is above 180 mg/dL. SEEK IMMEDIATE MEDICAL CARE IF:   You develop a fever of 101F (38.3C) or greater.   You have worsening pain or swelling around the injection site.   You have red streaking around the injection site.   You develop severe pain that is not controlled by your medicines.   You develop a headache, stiff neck, nausea, or vomiting.   Your eyes become very sensitive to light.   You have weakness, paralysis, or tingling in your arms or legs that was not present before the procedure.   You develop difficulty urinating or breathing.    This information is not intended to replace advice given to you by your health care provider. Make sure you discuss any questions you  have with your health care provider.   Document Released: 07/20/2012 Document Revised: 08/24/2014 Document Reviewed: 07/20/2012 Elsevier Interactive Patient Education 2016   [employer] Facet Joint Block The facet joints connect the bones of the spine (vertebrae). They make it possible for you to bend, twist, and make other movements with your spine. They also prevent you from overbending, overtwisting, and making other excessive movements.  A facet joint block is a procedure where a numbing medicine (anesthetic) is injected into a facet joint. Often, a type of anti-inflammatory medicine called a steroid is also injected. A facet joint block may be done for two reasons:   Diagnosis. A facet joint block may be done as a test to see whether neck or back pain is caused by a worn-down or infected facet joint. If the pain gets better after a facet joint block, it means the pain is probably coming from the facet joint. If the pain does not get better, it means the pain is probably not coming from the facet joint.   Therapy. A facet joint block may be done to relieve neck or back pain caused by a facet joint. A facet joint block is only done as a therapy if the pain does not improve with medicine, exercise programs, physical therapy, and other forms of pain management. LET YOUR HEALTH CARE PROVIDER KNOW ABOUT:   Any allergies you have.   All medicines you are taking, including vitamins, herbs, eyedrops, and over-the-counter medicines and creams.   Previous problems you or members of your family have had with the use of anesthetics.   Any blood disorders you have had.   Other health problems you have. RISKS AND COMPLICATIONS Generally, having a facet joint block is safe. However, as with any procedure, complications can occur. Possible complications associated with having a facet joint block include:   Bleeding.   Injury to a nerve near the injection site.   Pain at the injection site.    Weakness or numbness in areas controlled by nerves near the injection site.   Infection.   Temporary fluid retention.   Allergic reaction to anesthetics or medicines used during the procedure. BEFORE THE PROCEDURE   Follow your health care provider's instructions if you are taking dietary supplements or medicines. You may need to stop taking them or reduce your dosage.   Do not take any new dietary supplements or medicines without asking your health care provider first.   Follow your health care provider's instructions about eating and drinking before the procedure. You may need to stop eating and drinking several hours before the procedure.   Arrange to have an adult drive you home after the procedure. PROCEDURE  You may need to remove your clothing and dress in an open-back gown so that your health care provider can access your spine.   The procedure will be done while you are lying on an X-ray table. Most of the time you will be asked to lie on your stomach, but you may be asked to lie in a different position if an injection will be made in your neck.   Special machines will be used to monitor your oxygen levels, heart rate, and blood pressure.   If an injection will be made in your neck, an intravenous (IV) tube will be inserted into one of your veins. Fluids and medicine will flow directly into your body through the IV tube.   The area over the facet joint where the injection will be made will be cleaned with an antiseptic soap. The surrounding skin will be covered with sterile drapes.   An anesthetic will be applied to   your skin to make the injection area numb. You may feel a temporary stinging or burning sensation.   A video X-ray machine will be used to locate the joint. A contrast dye may be injected into the facet joint area to help with locating the joint.   When the joint is located, an anesthetic medicine will be injected into the joint through the  needle.   Your health care provider will ask you whether you feel pain relief. If you do feel relief, a steroid may be injected to provide pain relief for a longer period of time. If you do not feel relief or feel only partial relief, additional injections of an anesthetic may be made in other facet joints.   The needle will be removed, the skin will be cleansed, and bandages will be applied.  AFTER THE PROCEDURE   You will be observed for 15-30 minutes before being allowed to go home. Do not drive. Have an adult drive you or take a taxi or public transportation instead.   If you feel pain relief, the pain will return in several hours or days when the anesthetic wears off.   You may feel pain relief 2-14 days after the procedure. The amount of time this relief lasts varies from person to person.   It is normal to feel some tenderness over the injected area(s) for 2 days following the procedure.   If you have diabetes, you may have a temporary increase in blood sugar.   This information is not intended to replace advice given to you by your health care provider. Make sure you discuss any questions you have with your health care provider.   Document Released: 12/23/2006 Document Revised: 08/24/2014 Document Reviewed: 05/23/2012 Elsevier Interactive Patient Education 2016 [employer]  

## 2016-05-08 ENCOUNTER — Telehealth: Payer: Self-pay

## 2016-05-08 NOTE — Telephone Encounter (Signed)
Post procedure phone call.  Patient states he is doing good.  

## 2016-05-11 ENCOUNTER — Telehealth: Payer: Self-pay | Admitting: Cardiovascular Disease

## 2016-05-11 NOTE — Telephone Encounter (Signed)
New message ° ° ° ° ° ° °Pt returning nurse call  °

## 2016-05-11 NOTE — Telephone Encounter (Signed)
Left message for pt to call back  °

## 2016-05-12 MED ORDER — VALSARTAN 320 MG PO TABS: 320.0000 mg | ORAL_TABLET | Freq: Every day | ORAL | 11 refills | Status: DC

## 2016-05-12 NOTE — Telephone Encounter (Signed)
Change to Valsartan 320 mg a day Lets check BMP in 3 weeks.  Office visit soon

## 2016-05-12 NOTE — Telephone Encounter (Signed)
Spoke with patient about concern that he is taking losartan 100 mg tabs twice daily at times for elevated BP.  He states this occurred only on a few occasions and when he went to get refill he was 19 days short of renewal.  He states he doubled up on the medication when he was dealing with job stress earlier in the summer, which is no longer the case.  He states BP remains elevated; last check was 140/100 mmHg.  He is not currently taking losartan due to unable to get refill.  I advised that he needs evaluation for additional BP medication.  He advised that he does suffer from chronic back pain.  He states he is out of town all next week due to vacation.  I advised I will forward to Dr. Acie Fredrickson for advice on additional BP medication and then follow-up when he returns from vacation.  He verbalized understanding and agreement.

## 2016-05-12 NOTE — Telephone Encounter (Signed)
Spoke with patient and reviewed Dr. Elmarie Shiley advice with him.  I scheduled him for office visit and bmet on 10/17.  I advised him to call sooner with questions or concerns.  He verbalized understanding and agreement with plan of care.

## 2016-05-19 ENCOUNTER — Encounter: Payer: Self-pay | Admitting: Cardiovascular Disease

## 2016-05-26 ENCOUNTER — Encounter: Payer: Self-pay | Admitting: Pain Medicine

## 2016-05-26 ENCOUNTER — Other Ambulatory Visit: Payer: Self-pay | Admitting: Pain Medicine

## 2016-05-26 ENCOUNTER — Telehealth: Payer: Self-pay | Admitting: *Deleted

## 2016-05-26 DIAGNOSIS — M5416 Radiculopathy, lumbar region: Secondary | ICD-10-CM

## 2016-05-26 HISTORY — DX: Radiculopathy, lumbar region: M54.16

## 2016-05-26 MED ORDER — HYDROCODONE-ACETAMINOPHEN 5-325 MG PO TABS: 1.0000 | ORAL_TABLET | Freq: Four times a day (QID) | ORAL | 0 refills | Status: DC | PRN

## 2016-05-26 MED ORDER — PREDNISONE 20 MG PO TABS: ORAL_TABLET | ORAL | 0 refills | Status: DC

## 2016-05-26 NOTE — Telephone Encounter (Signed)
Spoke with Dr Dossie Arbour re; patient c/o pain in R leg.  Will schedule for MRI and procedure per Dr Dossie Arbour.  Also Rx for prednisone and Norco written for patient to come and pick up.  Patient verbalizes u/o information.

## 2016-05-26 NOTE — Progress Notes (Signed)
The patient calls with a complaint of a right sided lower extremity pain going down through the lateral and posterior aspect of the leg to about the calf at which point it turns into numbness goes all the way into the bottom of his foot. He is having difficulty with toe walking and when he sits down the pain improves but when he stands up it worsens. Based on the description the patient appears to be having an acute right-sided S1 radiculopathy. The patient will be given a prescription for a 9 day steroid pack, Norco for 10 days, and an order to have a lumbar MRI, as soon as possible. I will see him back after that.

## 2016-06-02 ENCOUNTER — Encounter: Payer: Self-pay | Admitting: Cardiovascular Disease

## 2016-06-02 ENCOUNTER — Encounter (INDEPENDENT_AMBULATORY_CARE_PROVIDER_SITE_OTHER): Payer: Self-pay

## 2016-06-02 ENCOUNTER — Ambulatory Visit (INDEPENDENT_AMBULATORY_CARE_PROVIDER_SITE_OTHER): Payer: 59 | Admitting: Cardiovascular Disease

## 2016-06-02 VITALS — BP 140/96 | HR 102 | Ht 74.0 in | Wt 270.6 lb

## 2016-06-02 DIAGNOSIS — G4730 Sleep apnea, unspecified: Secondary | ICD-10-CM | POA: Diagnosis not present

## 2016-06-02 DIAGNOSIS — I10 Essential (primary) hypertension: Secondary | ICD-10-CM

## 2016-06-02 MED ORDER — CARVEDILOL 6.25 MG PO TABS: 6.2500 mg | ORAL_TABLET | Freq: Two times a day (BID) | ORAL | 3 refills | Status: DC

## 2016-06-02 NOTE — Patient Instructions (Signed)
Your physician has recommended you make the following change in your medication:  1.) start carvedilol (Coreg) 6.25 mg -take 1 tablet two times a day 2.) change florinef to 0.1 mg - take one tablet once a day as needed for dizziness  Your physician recommends that you schedule a follow-up appointment in: 3 months with Dr. Acie Fredrickson.

## 2016-06-02 NOTE — Progress Notes (Signed)
Cardiology Office Note   Date:  06/02/2016   ID:  Dean Ford, DOB 08-Jan-1976, MRN [id number]  PCP:  Keith Rake, MD  Cardiologist:   Mertie Moores, MD   Chief Complaint  Patient presents with  . Hypertension   1. Hypertension 2. Chest pain - had a negative stress echo in Jan. 2016 3. Hypothyroidism 4. ? Obstructive sleep apnea 5.   Adrenal insufficiency   History of Present Illness:  Dean Ford is a 40 who is referred by Dr. Debbora Dus for evaluation of his CP. He has a hx of HTN and also has hypoadrenalism, hypothyroidism, low testosterone.  CP is just left of center. Pain seems to be related to emotional stress. Starts with dyspnea. He would typically get shortness of breath, head ache and then get chest pain. Would take another losartan and all the symptoms would improve after an hour. Associated with exertion - walking . Seems to be worse in the afternoon  He snores at night. Wakes up gasping for air frequently. Has been referred for sleep study. He did not make the appt.  Does not get any regular exercise.  Has low energy due to his endocrine issues. Has had a head MRI. pituitary gland was ok. Has had cholesterol checked in the past - was borderline years ago.  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 Was not able to complete his last training session because of shortness of breath.   September 20, 2014:  Dean Ford is a 40 y.o. male who presents for follow-up for his chest pain. He had an echocardiogram that revealed normal left ventricle systolic function. He had a stress echo ( results are not found at this time) He has not been on his BP med.   BP has been higher .    No further CP,  Not exercising at all.   December 13, 2014:  Dean Ford had some chest pain - mid sternal, radiated through his back Worsened with deep breath /  deep exhale Went to the [facility] ER  Work up was negative  Has felt fine since that time.   Has been working out vigorously without any chest   December 05, 2015 Dean Ford is seen for follow up .   Has had elevated BP . Has been painting his bathroom, gets sweaty, red faced. Has some chest tightness.  Has symptoms of sleep apnea. Snores, wakes up snoring,  Not getting any other exercise  Trying to stay away from fast foods.  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  Has gained 9 lbs in the past 6 months    Oct. 17, 2017: Dean Ford is seen back today  He was diagnosed with adrenal insufficiency approximate one year ago. He's been on Cortef and Florinef since that time. Recently he's been having problems with high blood pressures. He tries to watch his salt intake. 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  Feels dehydrated all of the time.   Drinks lots of fluids all day long and then has to urinate all night long .    Wt Readings from Last 3 Encounters:  06/02/16 270 lb 9.6 oz (122.7 kg)  05/07/16 275 lb (124.7  kg)  05/03/16 275 lb (124.7 kg)      Past Medical History:  Diagnosis Date  . Adrenal insufficiency ([place])   . Anxiety   . Arthritis   . Back pain, chronic 06/03/2015  . Hypertension   . Hypothyroid   . Hypothyroid   . Sleep apnea   . Thyroid disease     Past Surgical History:  Procedure Laterality Date  . ELBOW SURGERY     right elbow     Current Outpatient Prescriptions  Medication Sig Dispense Refill  . atorvastatin (LIPITOR) 40 MG tablet Take 1 tablet (40 mg total) by mouth daily. 30 tablet 11  . cyclobenzaprine (FLEXERIL) 10 MG tablet Take 1 tablet (10 mg total) by mouth 3 (three) times daily as needed for muscle spasms. Maximum: 2/day 90 tablet 5  . fludrocortisone (FLORINEF) 0.1 MG tablet Take 0.1 mg by mouth daily.    . hydrochlorothiazide  (HYDRODIURIL) 25 MG tablet Take 1 tablet (25 mg total) by mouth daily. 90 tablet 3  . HYDROcodone-acetaminophen (NORCO/VICODIN) 5-325 MG tablet Take 1 tablet by mouth every 6 (six) hours as needed for moderate pain. 40 tablet 0  . hydrocortisone (CORTEF) 10 MG tablet Take 10 mg by mouth daily.     Marland Kitchen levothyroxine (SYNTHROID, LEVOTHROID) 75 MCG tablet Take 75 mcg by mouth daily before breakfast.     . potassium chloride (K-DUR) 10 MEQ tablet Take 1 tablet (10 mEq total) by mouth daily. 90 tablet 3  . predniSONE (DELTASONE) 20 MG tablet Taper down every 3 days from 3 tab(s)/day to 2, then to 1, then stop. Take in the morning. 21 tablet 0  . testosterone cypionate (DEPOTESTOTERONE CYPIONATE) 200 MG/ML injection Inject 100 mg into the muscle every 14 (fourteen) days.     . traMADol (ULTRAM) 50 MG tablet Take 1-2 tablets (50-100 mg total) by mouth every 6 (six) hours as needed for severe pain. 240 tablet 5  . valsartan (DIOVAN) 320 MG tablet Take 1 tablet (320 mg total) by mouth daily. 30 tablet 11  . VIAGRA 50 MG tablet TAKE ONE TABLET BY MOUTH AS NEEDED FOR ERECTILE DYSFUNCTION 10 tablet 0   No current facility-administered medications for this visit.     Allergies:   Borax    Social History:  The patient  reports that he has quit smoking. He has quit using smokeless tobacco. He reports that he does not drink alcohol or use drugs.   Family History:  The patient's family history includes Heart attack (age of onset: 28) in his father; Hypertension in his father.    ROS:  Please see the history of present illness.    Review of Systems: Constitutional:  denies fever, chills, diaphoresis, appetite change and fatigue.  HEENT: denies photophobia, eye pain, redness, hearing loss, ear pain, congestion, sore throat, rhinorrhea, sneezing, neck pain, neck stiffness and tinnitus.  Respiratory: denies SOB, DOE, cough, chest tightness, and wheezing.  Cardiovascular: denies chest pain, palpitations and leg  swelling.  Gastrointestinal: denies nausea, vomiting, abdominal pain, diarrhea, constipation, blood in stool.  Genitourinary: denies dysuria, urgency, frequency, hematuria, flank pain and difficulty urinating.  Musculoskeletal: denies  myalgias, back pain, joint swelling, arthralgias and gait problem.   Skin: denies pallor, rash and wound.  Neurological: denies dizziness, seizures, syncope, weakness, light-headedness, numbness and headaches.   Hematological: denies adenopathy, easy bruising, personal or family bleeding history.  Psychiatric/ Behavioral: denies suicidal ideation, mood changes, confusion, nervousness, sleep disturbance and agitation.  All other systems are reviewed and negative.    PHYSICAL EXAM: VS:  BP (!) 140/96   Pulse (!) 102   Ht 6\' 2"  (1.88 m)   Wt 270 lb 9.6 oz (122.7 kg)   SpO2 96%   BMI 34.74 kg/m  , BMI Body mass index is 34.74 kg/m. GEN: Well nourished, well developed, in no acute distress  HEENT: normal  Neck: no JVD, carotid bruits, or masses Cardiac: RRR;  mmid systolic click  no murmurs, rubs, or gallops,no edema  Respiratory:  clear to auscultation bilaterally, normal work of breathing GI: soft, nontender, nondistended, + BS MS: no deformity or atrophy  Skin: warm and dry, no rash Neuro:  Strength and sensation are intact Psych: normal   EKG:  EKG is ordered today.  ECG reveals:  NSR at 87 .  NS ST abn.  No acute changes     Recent Labs: 03/12/2016: ALT 36; BUN 11; Creat 1.04; Potassium 4.0; Sodium 137    Lipid Panel    Component Value Date/Time   CHOL 185 03/12/2016 0755   TRIG 290 (H) 03/12/2016 0755   HDL 30 (L) 03/12/2016 0755   CHOLHDL 6.2 (H) 03/12/2016 0755   VLDL 58 (H) 03/12/2016 0755   LDLCALC 97 03/12/2016 0755      Wt Readings from Last 3 Encounters:  06/02/16 270 lb 9.6 oz (122.7 kg)  05/07/16 275 lb (124.7 kg)  05/03/16 275 lb (124.7 kg)      Other studies Reviewed: Additional studies/ records that were  reviewed today include:  Stress echo and resting echo . Review of the above records demonstrates: normal LV function    ASSESSMENT AND PLAN:  1. Hypertension - he presents today frustrated about his BP variability. He takes Florinef and cortef for his adrenal insufficiency . He also takes HCTZ - but admits that he only takes it occasionally because he cannot stop his work to go to the bathroom  He still eats snacks ( nabs, etc.)  Does not get regular exercise Will start by adding coreg 6.25 BID  Have discussed with Dr. Chalmers Cater .  Will change the florinef to PRN. He will continue to take the HCTZ 3 times a week or so    2. Chest pain- no further CP   3. Hypothyroidism - continue meds  4. ? Obstructive sleep apnea - moderate, has not been using the CPAP. This is also is contributing to his HTN  5. Adrenal insufficiency:. He's followed by an endocrinologist. Continue current dose of Cortef. Change florinef to  PRN    Current medicines are reviewed at length with the patient today.  The patient does not have concerns regarding medicines.  The following changes have been made: No changes.  Total time spent in patient care - 50 mintues   Disposition:   FU with me in 3  months.     Signed, Mertie Moores, MD  06/02/2016 9:35 AM    [facility] [address], [place], [place]  [address] Phone: [phone]; Fax: [phone]

## 2016-06-10 ENCOUNTER — Ambulatory Visit: Payer: 59 | Attending: Pain Medicine | Admitting: Pain Medicine

## 2016-06-10 ENCOUNTER — Encounter: Payer: Self-pay | Admitting: Pain Medicine

## 2016-06-10 VITALS — BP 130/90 | HR 90 | Temp 98.9°F | Resp 16 | Ht 74.0 in | Wt 270.0 lb

## 2016-06-10 DIAGNOSIS — M25551 Pain in right hip: Secondary | ICD-10-CM | POA: Insufficient documentation

## 2016-06-10 DIAGNOSIS — M479 Spondylosis, unspecified: Secondary | ICD-10-CM | POA: Diagnosis not present

## 2016-06-10 DIAGNOSIS — E785 Hyperlipidemia, unspecified: Secondary | ICD-10-CM | POA: Diagnosis not present

## 2016-06-10 DIAGNOSIS — N529 Male erectile dysfunction, unspecified: Secondary | ICD-10-CM | POA: Diagnosis not present

## 2016-06-10 DIAGNOSIS — E559 Vitamin D deficiency, unspecified: Secondary | ICD-10-CM | POA: Diagnosis not present

## 2016-06-10 DIAGNOSIS — M5136 Other intervertebral disc degeneration, lumbar region: Secondary | ICD-10-CM | POA: Diagnosis not present

## 2016-06-10 DIAGNOSIS — Z79891 Long term (current) use of opiate analgesic: Secondary | ICD-10-CM | POA: Insufficient documentation

## 2016-06-10 DIAGNOSIS — G8929 Other chronic pain: Secondary | ICD-10-CM

## 2016-06-10 DIAGNOSIS — M5417 Radiculopathy, lumbosacral region: Secondary | ICD-10-CM | POA: Diagnosis not present

## 2016-06-10 DIAGNOSIS — E039 Hypothyroidism, unspecified: Secondary | ICD-10-CM | POA: Diagnosis not present

## 2016-06-10 DIAGNOSIS — M5416 Radiculopathy, lumbar region: Secondary | ICD-10-CM | POA: Insufficient documentation

## 2016-06-10 DIAGNOSIS — F1190 Opioid use, unspecified, uncomplicated: Secondary | ICD-10-CM

## 2016-06-10 DIAGNOSIS — Z5181 Encounter for therapeutic drug level monitoring: Secondary | ICD-10-CM | POA: Insufficient documentation

## 2016-06-10 DIAGNOSIS — R200 Anesthesia of skin: Secondary | ICD-10-CM | POA: Diagnosis not present

## 2016-06-10 DIAGNOSIS — Z79899 Other long term (current) drug therapy: Secondary | ICD-10-CM | POA: Diagnosis not present

## 2016-06-10 DIAGNOSIS — Z6834 Body mass index (BMI) 34.0-34.9, adult: Secondary | ICD-10-CM | POA: Insufficient documentation

## 2016-06-10 DIAGNOSIS — M1610 Unilateral primary osteoarthritis, unspecified hip: Secondary | ICD-10-CM | POA: Insufficient documentation

## 2016-06-10 DIAGNOSIS — E669 Obesity, unspecified: Secondary | ICD-10-CM | POA: Diagnosis not present

## 2016-06-10 DIAGNOSIS — I10 Essential (primary) hypertension: Secondary | ICD-10-CM | POA: Insufficient documentation

## 2016-06-10 DIAGNOSIS — G894 Chronic pain syndrome: Secondary | ICD-10-CM | POA: Diagnosis not present

## 2016-06-10 DIAGNOSIS — M5441 Lumbago with sciatica, right side: Secondary | ICD-10-CM | POA: Diagnosis not present

## 2016-06-10 DIAGNOSIS — M79604 Pain in right leg: Secondary | ICD-10-CM | POA: Diagnosis not present

## 2016-06-10 DIAGNOSIS — F419 Anxiety disorder, unspecified: Secondary | ICD-10-CM | POA: Insufficient documentation

## 2016-06-10 DIAGNOSIS — G4730 Sleep apnea, unspecified: Secondary | ICD-10-CM | POA: Diagnosis not present

## 2016-06-10 DIAGNOSIS — Z87891 Personal history of nicotine dependence: Secondary | ICD-10-CM | POA: Insufficient documentation

## 2016-06-10 MED ORDER — HYDROCODONE-ACETAMINOPHEN 5-325 MG PO TABS: 2.0000 | ORAL_TABLET | Freq: Four times a day (QID) | ORAL | 0 refills | Status: DC | PRN

## 2016-06-10 MED ORDER — TRAMADOL HCL 50 MG PO TABS: 50.0000 mg | ORAL_TABLET | Freq: Four times a day (QID) | ORAL | 5 refills | Status: DC | PRN

## 2016-06-10 NOTE — Progress Notes (Signed)
Patient's Name: Dean Ford  MRN: [id number]  Referring Provider: Roselee Nova, MD  DOB: Dec 22, 1975  PCP: Keith Rake, MD  DOS: 06/10/2016  Note by: Kathlen Brunswick. Dossie Arbour, MD  Service setting: Ambulatory outpatient  Specialty: Interventional Pain Management  Location: [facility] (AMB) Pain Management Facility    Patient type: Established   Primary Reason(s) for Visit: Encounter for prescription drug management (Level of risk: moderate) CC: Leg Pain (right, posterrior)  HPI  Dean Ford is a 40 y.o. year old, male patient, who comes today for an initial evaluation. 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[place]); Anxiety disorder; Dehydration symptoms; Dyslipidemia; Encounter for therapeutic drug level monitoring; Long term current use of opiate analgesic; Uncomplicated opioid dependence ([place]); Opiate use; Lumbar spondylosis; Lumbar facet syndrome (Bilateral) (R>L); Chronic low back pain (Location of Primary Source of Pain) (R>L); Insomnia, persistent; BP (high blood pressure); Adult hypothyroidism; Intermittent muscle cramps; Always thirsty; Avitaminosis D; Lumbar spine pain; Hip arthrosis; Obesity; DDD (degenerative disc disease), lumbar; L4-L5 disc bulge; Ligamentum flavum hypertrophy (HCC); Bulging lumbar disc; Sleep apnea; Chronic sacroiliac joint pain (Right); Lumbar facet hypertrophy; Lumbosacral radiculopathy at S1 (right-sided); Chronic pain; Long term prescription opiate use; Encounter for chronic pain management; Chronic bilateral low back pain with right-sided sciatica; Erectile dysfunction; Spasm of paraspinal muscle; Lower extremity numbness (Bilateral) (R>L); Chronic lumbar radicular pain (right S1 and bilateral L5) (Location of Secondary source of pain) (Bilateral) (R>L); Chronic hip pain (Right); Osteoarthritis of hip (Right); and Acute lumbar radiculopathy (Right) (S1) on his problem list.. His primarily concern today is the Leg Pain (right,  posterrior)  Pain Assessment: Self-Reported Pain Score: 5 /10             Reported level is compatible with observation.       Pain Type: Chronic pain Pain Location: Leg Pain Orientation: Right, Posterior (Numbness is in back of right lower leg and right foot.) Pain Descriptors / Indicators: Numbness (stretching) Pain Frequency: Intermittent  Dean Ford was last seen on 05/07/2016 for medication management. During today's appointment we reviewed Dean Ford chronic pain status, as well as his outpatient medication regimen. On 05/26/2016 and the patient was started on hydrocodone/APAP 5/325 one tablet by mouth 4 times a day and a prednisone pack to help him with his MRI. He is actually scheduled to have this MRI done tomorrow. He still having pain but not quite as much as when he started the medicine. He was initially given a prescription for 10 days but since he is running out today we will refill it and have him use that medicine for the pulse radiofrequency period.  The patient  reports that he does not use drugs. His body mass index is 34.67 kg/m.  Further details on both, my assessment(s), as well as the proposed treatment plan, please see below.  Controlled Substance Pharmacotherapy Assessment REMS (Risk Evaluation and Mitigation Strategy)  Analgesic: Tramadol 100 mg every 6 hours (400 mg/day of tramadol) + Hydrocodone/APAP 5/325 one tablet by mouth 4 times a day (20 mg/day) MME/day: 60 mg/day.  Pharmacokinetics: Liberation and absorption (onset of action): WNL Distribution (time to peak effect): WNL Metabolism and excretion (duration of action): WNL         Pharmacodynamics: Desired effects: Analgesia: The patient reports >50% benefit. Reported improvement in function: The patient reports medication allows him to accomplish basic ADLs. Clinically meaningful improvement in function (CMIF): Sustained CMIF goals met Perceived effectiveness: Described as relatively effective, allowing  for increase in  activities of daily living (ADL) Undesirable effects: Side-effects or Adverse reactions: None reported Monitoring: [place] PMP: Online review of the past 48-monthperiod conducted. Compliant with practice rules and regulations List of all UDS test(s) done:  Lab Results  Component Value Date   TOXASSSELUR FINAL 01/22/2016   Last UDS on record: Tox[employer] Select 13  Date Value Ref Range Status  01/22/2016 FINAL  Final    Comment:    ==================================================================== TOXASSURE SELECT 13 (MW) ==================================================================== Test                             Result       Flag       Units Drug Present and Declared for Prescription Verification   Tramadol                       PRESENT      EXPECTED   O-Desmethyltramadol            PRESENT      EXPECTED   N-Desmethyltramadol            PRESENT      EXPECTED    Source of tramadol is a prescription medication.    O-desmethyltramadol and N-desmethyltramadol are expected    metabolites of tramadol. ==================================================================== Test                      Result    Flag   Units      Ref Range   Creatinine              264              mg/dL      >=20 ==================================================================== Declared Medications:  The flagging and interpretation on this report are based on the  following declared medications.  Unexpected results may arise from  inaccuracies in the declared medications.  **Note: The testing scope of this panel includes these medications:  Tramadol (Ultram)  **Note: The testing scope of this panel does not include following  reported medications:  Atorvastatin (Lipitor)  Cyclobenzaprine (Flexeril)  Fludrocortisone (Florinef)  Hydrochlorothiazide (Hydrodiuril)  Hydrocortisone (Cortef)  Levothyroxine  Losartan (Cozaar)  Potassium (K-Dur)  Sildenafil (Viagra)   Testosterone ==================================================================== For clinical consultation, please call ([phone] ====================================================================    UDS interpretation: Compliant          Medication Assessment Form: Reviewed. Patient indicates being compliant with therapy Treatment compliance: Compliant Risk Assessment Profile: Aberrant behavior: See prior evaluations. None observed or detected today Comorbid factors increasing risk of overdose: See prior notes. No additional risks detected today Risk of substance use disorder (SUD): Low Opioid Risk Tool (ORT) Total Score:    Interpretation Table:  Score <3 = Low Risk for SUD  Score between 4-7 = Moderate Risk for SUD  Score >8 = High Risk for Opioid Abuse   Risk Mitigation Strategies:  Patient Counseling:  Covered Patient-Prescriber Agreement (PPA): Present and active  Notification to other healthcare providers: Done  Pharmacologic Plan: No change in therapy, at this time  Post-Procedure Assessment  05/07/2016 Procedure: Palliative right diagnostic Lumbar facet block under fluoroscopic guidance and IV sedation. Influential Factors: BMI: 34.67 kg/m Intra-procedural challenges: None Assessment challenges: Results reported today are inconsistent with those reported on procedure day, immediately before discharge. Previously the patient had reported 100% relief of the pain, before leaving the facility Post-procedural side-effects, adverse reactions, or complications: None reported Reported  issues: None reported  Sedation: Please see nurses note. When no sedatives are used, the analgesic levels obtained are directly associated to the effectiveness of the local anesthetics. However, when sedation is provided, the level of analgesia obtained during the initial 1 hour following the intervention, is believed to be the result of a combination of factors. These factors may include,  but are not limited to: 1. The effectiveness of the local anesthetics used. 2. The effects of the analgesic(s) and/or anxiolytic(s) used. 3. The degree of discomfort experienced by the patient at the time of the procedure. 4. The patients ability and reliability in recalling and recording the events. 5. The presence and influence of possible secondary gains and/or psychosocial factors. Reported result: Relief experienced during the 1st hour after the procedure: 20 % (Ultra-Short Term Relief) Interpretative annotation: Inaccurate reports since the patient is including an area that was not tested. He indicates having a considerable amount of pain in the leg which did not go away with the procedure. The procedure was basically testing just the lumbar spine and not lower extremity pain.  Effects of local anesthetic: The analgesic effects attained during this period are directly associated to the localized infiltration of local anesthetics and therefore cary significant diagnostic value as to the etiological location, or anatomical origin, of the pain. Expected duration of relief is directly dependent on the pharmacodynamics of the local anesthetic used. Long-acting (4-6 hours) anesthetics used.  Reported result: Relief during the next 4 to 6 hour after the procedure: 20 % (Short-Term Relief) Interpretative annotation: Partial relief. This would suggest incomplete involvement of injected area  Long-term benefit: Defined as the period of time past the expected duration of local anesthetics. With the possible exception of prolonged sympathetic blockade from the local anesthetics, benefits during this period are typically attributed to, or associated with, other factors such as analgesic sensory neuropraxia, antiinflammatory effects, or beneficial biochemical changes provided by agents other than the local anesthetics Reported result: Extended relief following procedure: 10 % (lasted 4 days) (Long-Term  Relief) Interpretative annotation: Partial relief. Possible incomplete therapeutic success.          Current benefits: Defined as persistent relief that continues at this point in time.   Reported results: Treated area: <25 % In addition, the patient reports improvement in function Interpretative annotation: Recurrance of symptoms. This would suggest persistent aggravating factors  Interpretation: Results would suggest therapy to have a positive impact on the patient's condition. The patient has failed to respond to conservative therapies including over-the-counter medications, anti-inflammatories, muscle relaxants, membrane stabilizers, opioids, physical therapy, modalities such as heat and ice, as well as more invasive techniques such as nerve blocks. Because Mr. Schwartz did attain more than 50% relief of the pain during a series of diagnostic blocks conducted in separate occasions, I believe it is medically necessary to proceed with Radiofrequency Ablation, in order to attempt gaining longer relief.    Laboratory Chemistry  Inflammation Markers No results found for: ESRSEDRATE, CRP Renal Function Lab Results  Component Value Date   BUN 11 03/12/2016   CREATININE 1.04 03/12/2016   GFRAA 124 03/01/2015   GFRNONAA 107 03/01/2015   Hepatic Function Lab Results  Component Value Date   AST 26 03/12/2016   ALT 36 03/12/2016   ALBUMIN 4.3 03/12/2016   Electrolytes Lab Results  Component Value Date   NA 137 03/12/2016   K 4.0 03/12/2016   CL 102 03/12/2016   CALCIUM 9.3 03/12/2016   MG 2.1 12/19/2012  Pain Modulating Vitamins No results found for: Marveen Reeks [id number], [id number], 25OHVITD1, 25OHVITD2, 25OHVITD3, VITAMINB12 Coagulation Parameters Lab Results  Component Value Date   PLT 238 03/01/2015   Cardiovascular Lab Results  Component Value Date   HGB 15.9 12/04/2014   HCT 46.0 03/01/2015   Note: Lab results reviewed.  Recent Diagnostic Imaging Review  Dg  C-arm 1-60 Min-no Report  Result Date: 05/07/2016 CLINICAL DATA: Assistance in needle guidance and placement for procedures requiring needle placement in or near specific anatomical locations not easily accessible without such assistance. C-ARM 1-60 MINUTES Fluoroscopy was utilized by the requesting physician.  No radiographic interpretation.   Note: Imaging results reviewed.  Meds  The patient has a current medication list which includes the following prescription(s): atorvastatin, carvedilol, cyclobenzaprine, fludrocortisone, hydrochlorothiazide, hydrocodone-acetaminophen, hydrocortisone, levothyroxine, potassium chloride, testosterone cypionate, tramadol, valsartan, and viagra.  Current Outpatient Prescriptions on File Prior to Visit  Medication Sig  . atorvastatin (LIPITOR) 40 MG tablet Take 1 tablet (40 mg total) by mouth daily.  . carvedilol (COREG) 6.25 MG tablet Take 1 tablet (6.25 mg total) by mouth 2 (two) times daily.  . cyclobenzaprine (FLEXERIL) 10 MG tablet Take 1 tablet (10 mg total) by mouth 3 (three) times daily as needed for muscle spasms. Maximum: 2/day  . fludrocortisone (FLORINEF) 0.1 MG tablet Take 0.1 mg by mouth daily as needed for dizziness.  . hydrochlorothiazide (HYDRODIURIL) 25 MG tablet Take 1 tablet (25 mg total) by mouth daily.  . hydrocortisone (CORTEF) 10 MG tablet Take 10 mg by mouth daily.   Marland Kitchen levothyroxine (SYNTHROID, LEVOTHROID) 75 MCG tablet Take 75 mcg by mouth daily before breakfast.   . potassium chloride (K-DUR) 10 MEQ tablet Take 1 tablet (10 mEq total) by mouth daily.  Marland Kitchen testosterone cypionate (DEPOTESTOTERONE CYPIONATE) 200 MG/ML injection Inject 100 mg into the muscle every 14 (fourteen) days.   . valsartan (DIOVAN) 320 MG tablet Take 1 tablet (320 mg total) by mouth daily.  Marland Kitchen VIAGRA 50 MG tablet TAKE ONE TABLET BY MOUTH AS NEEDED FOR ERECTILE DYSFUNCTION   No current facility-administered medications on file prior to visit.    ROS   Constitutional: Denies any fever or chills Gastrointestinal: No reported hemesis, hematochezia, vomiting, or acute GI distress Musculoskeletal: Denies any acute onset joint swelling, redness, loss of ROM, or weakness Neurological: No reported episodes of acute onset apraxia, aphasia, dysarthria, agnosia, amnesia, paralysis, loss of coordination, or loss of consciousness  Allergies  Mr. Brach is allergic to borax.  PFSH  Drug: Mr. Hinshaw  reports that he does not use drugs. Alcohol:  reports that he does not drink alcohol. Tobacco:  reports that he has quit smoking. He has quit using smokeless tobacco. Medical:  has a past medical history of Adrenal insufficiency ([place]); Anxiety; Arthritis; Back pain, chronic (06/03/2015); Hypertension; Hypothyroid; Hypothyroid; Sleep apnea; and Thyroid disease. Family: family history includes Heart attack (age of onset: 31) in his father; Hypertension in his father.  Past Surgical History:  Procedure Laterality Date  . ELBOW SURGERY     right elbow   Constitutional Exam  General appearance: Well nourished, well developed, and well hydrated. In no apparent acute distress Vitals:   06/10/16 1302  BP: 130/90  Pulse: 90  Resp: 16  Temp: 98.9 F (37.2 C)  SpO2: 98%  Weight: 270 lb (122.5 kg)  Height: 6' 2"  (1.88 m)   BMI Assessment: Estimated body mass index is 34.67 kg/m as calculated from the following:   Height as of this encounter:  6' 2"  (1.88 m).   Weight as of this encounter: 270 lb (122.5 kg).  BMI interpretation table: BMI level Category Range association with higher incidence of chronic pain  <18 kg/m2 Underweight   18.5-24.9 kg/m2 Ideal body weight   25-29.9 kg/m2 Overweight Increased incidence by 20%  30-34.9 kg/m2 Obese (Class I) Increased incidence by 68%  35-39.9 kg/m2 Severe obesity (Class II) Increased incidence by 136%  >40 kg/m2 Extreme obesity (Class III) Increased incidence by 254%   BMI Readings from Last 4 Encounters:   06/10/16 34.67 kg/m  06/02/16 34.74 kg/m  05/07/16 35.31 kg/m  05/03/16 35.31 kg/m   Wt Readings from Last 4 Encounters:  06/10/16 270 lb (122.5 kg)  06/02/16 270 lb 9.6 oz (122.7 kg)  05/07/16 275 lb (124.7 kg)  05/03/16 275 lb (124.7 kg)  Psych/Mental status: Alert, oriented x 3 (person, place, & time) Eyes: PERLA Respiratory: No evidence of acute respiratory distress  Cervical Spine Exam  Inspection: No masses, redness, or swelling Alignment: Symmetrical Functional ROM: Unrestricted ROM Stability: No instability detected Muscle strength & Tone: Functionally intact Sensory: Unimpaired Palpation: Non-contributory  Upper Extremity (UE) Exam    Side: Right upper extremity  Side: Left upper extremity  Inspection: No masses, redness, swelling, or asymmetry  Inspection: No masses, redness, swelling, or asymmetry  Functional ROM: Unrestricted ROM         Functional ROM: Unrestricted ROM          Muscle strength & Tone: Functionally intact  Muscle strength & Tone: Functionally intact  Sensory: Unimpaired  Sensory: Unimpaired  Palpation: Non-contributory  Palpation: Non-contributory   Thoracic Spine Exam  Inspection: No masses, redness, or swelling Alignment: Symmetrical Functional ROM: Unrestricted ROM Stability: No instability detected Sensory: Unimpaired Muscle strength & Tone: Functionally intact Palpation: Non-contributory  Lumbar Spine Exam  Inspection: No masses, redness, or swelling Alignment: Symmetrical Functional ROM: Decreased ROM Stability: No instability detected Muscle strength & Tone: Functionally intact Sensory: Movement-associated pain Palpation: Complains of area being tender to palpation Provocative Tests: Lumbar Hyperextension and rotation test: Positive bilaterally for facet joint pain. Patrick's Maneuver: evaluation deferred today              Gait & Posture Assessment  Ambulation: Unassisted Gait: Relatively normal for age and body  habitus Posture: WNL   Lower Extremity Exam    Side: Right lower extremity  Side: Left lower extremity  Inspection: No masses, redness, swelling, or asymmetry  Inspection: No masses, redness, swelling, or asymmetry  Functional ROM: Unrestricted ROM          Functional ROM: Unrestricted ROM          Muscle strength & Tone: Functionally intact  Muscle strength & Tone: Functionally intact  Sensory: Unimpaired  Sensory: Unimpaired  Palpation: Non-contributory  Palpation: Non-contributory   Assessment  Primary Diagnosis & Pertinent Problem List: The primary encounter diagnosis was Chronic pain. Diagnoses of Long term current use of opiate analgesic, Opiate use, Acute lumbar radiculopathy (Right) (S1), and Chronic pain syndrome were also pertinent to this visit.  Visit Diagnosis: 1. Chronic pain   2. Long term current use of opiate analgesic   3. Opiate use   4. Acute lumbar radiculopathy (Right) (S1)   5. Chronic pain syndrome    Plan of Care  Pharmacotherapy (Medications Ordered): Meds ordered this encounter  Medications  . HYDROcodone-acetaminophen (NORCO/VICODIN) 5-325 MG tablet    Sig: Take 2 tablets by mouth every 6 (six) hours as needed for moderate pain.  Dispense:  240 tablet    Refill:  0    Do not place this medication, or any other prescription from our practice, on "Automatic Refill". Patient may have prescription filled one day early if pharmacy is closed on scheduled refill date. Do not fill until: 06/10/16 To last until: 07/10/16  . traMADol (ULTRAM) 50 MG tablet    Sig: Take 1-2 tablets (50-100 mg total) by mouth every 6 (six) hours as needed for severe pain.    Dispense:  240 tablet    Refill:  5    Do not place this medication, or any other prescription from our practice, on "Automatic Refill". Patient may have prescription filled one day early if pharmacy is closed on scheduled refill date. Do not refill any sooner than every 30 days.   New Prescriptions   No  medications on file   Medications administered during this visit: Mr. Bachus had no medications administered during this visit. Lab-work, Procedure(s), & Referral(s) Ordered: No orders of the defined types were placed in this encounter.  Imaging & Referral(s) Ordered: None  Interventional Therapies: Pending/Scheduled/Planned:   Right Lumbar Facet RFA.   Considering:   Caudal epidural steroid injection under fluoroscopic guidance, with or without sedation.  Lumbar epidural steroid injection under fluoroscopic guidance, with or without sedation.  Bilateral lumbar facet block under fluoroscopic guidance with sedation.  Possible lumbar facet radiofrequency ablation.    PRN Procedures:   Caudal epidural steroid injection under fluoroscopic guidance, with or without sedation.  Right L5-S1 lumbar epidural steroid injection under fluoroscopic guidance, with or without sedation. Diagnostic bilateral lumbar facet block under fluoroscopic guidance and IV sedation.  Right-sided sacroiliac joint block under fluoroscopic guidance, without without sedation.  Right-sided intra-articular hip joint injection under fluoroscopic guidance, without without sedation.    Requested PM Follow-up: Return in about 6 months (around 12/09/2016) for Med-Mgmt, In addition, Keep prior appointment for RFA.  Future Appointments Date Time Provider [place]  06/11/2016 11:00 AM [facility]  06/18/2016 10:40 AM Milinda Pointer, MD [facility] None  08/28/2016 9:15 AM Thayer Headings, MD [facility]  11/19/2016 7:45 AM Milinda Pointer, MD [facility] None   Primary Care Physician: Keith Rake, MD Location: [facility] Outpatient [facility] Management Facility Note by: Kathlen Brunswick. Dossie Arbour, M.D, DABA, DABAPM, DABPM, DABIPP, FIPP  Pain Score Disclaimer: We use the NRS-11 scale. This is a self-reported, subjective measurement of pain severity with only modest accuracy. It is used primarily to  identify changes within a particular patient. It must be understood that outpatient pain scales are significantly less accurate that those used for research, where they can be applied under ideal controlled circumstances with minimal exposure to variables. In reality, the score is likely to be a combination of pain intensity and pain affect, where pain affect describes the degree of emotional arousal or changes in action readiness caused by the sensory experience of pain. Factors such as social and work situation, setting, emotional state, anxiety levels, expectation, and prior pain experience may influence pain perception and show large inter-individual differences that may also be affected by time variables.  Patient instructions provided during this appointment: Patient Instructions  Pain Management Discharge Instructions  General Discharge Instructions :  If you need to reach your doctor call: Monday-Friday 8:00 am - 4:00 pm at [phone] or toll free [phone].  After clinic hours [phone] to have operator reach doctor.  Bring all of your medication bottles to all your appointments in the pain clinic.  To cancel  or reschedule your appointment with Pain Management please remember to call 24 hours in advance to avoid a fee.  Refer to the educational materials which you have been given on: General Risks, I had my Procedure. Discharge Instructions, Post Sedation.  Post Procedure Instructions:  The drugs you were given will stay in your system until tomorrow, so for the next 24 hours you should not drive, make any legal decisions or drink any alcoholic beverages.  You may eat anything you prefer, but it is better to start with liquids then soups and crackers, and gradually work up to solid foods.  Please notify your doctor immediately if you have any unusual bleeding, trouble breathing or pain that is not related to your normal pain.  Depending on the type of procedure that was  done, some parts of your body may feel week and/or numb.  This usually clears up by tonight or the next day.  Walk with the use of an assistive device or accompanied by an adult for the 24 hours.  You may use ice on the affected area for the first 24 hours.  Put ice in a Ziploc bag and cover with a towel and place against area 15 minutes on 15 minutes off.  You may switch to heat after 24 hours.

## 2016-06-10 NOTE — Patient Instructions (Signed)

## 2016-06-10 NOTE — Progress Notes (Signed)
Safety precautions to be maintained throughout the outpatient stay will include: orient to surroundings, keep bed in low position, maintain call bell within reach at all times, provide assistance with transfer out of bed and ambulation.  

## 2016-06-11 ENCOUNTER — Ambulatory Visit
Admission: RE | Admit: 2016-06-11 | Discharge: 2016-06-11 | Disposition: A | Discharge status: Home / Self Care | Payer: 59 | Source: Ambulatory Visit | Attending: Pain Medicine | Admitting: Pain Medicine

## 2016-06-11 DIAGNOSIS — M5127 Other intervertebral disc displacement, lumbosacral region: Secondary | ICD-10-CM | POA: Insufficient documentation

## 2016-06-11 DIAGNOSIS — M5416 Radiculopathy, lumbar region: Secondary | ICD-10-CM | POA: Diagnosis not present

## 2016-06-11 DIAGNOSIS — M5126 Other intervertebral disc displacement, lumbar region: Secondary | ICD-10-CM | POA: Diagnosis not present

## 2016-06-11 DIAGNOSIS — M1288 Other specific arthropathies, not elsewhere classified, other specified site: Secondary | ICD-10-CM | POA: Insufficient documentation

## 2016-06-11 DIAGNOSIS — M5116 Intervertebral disc disorders with radiculopathy, lumbar region: Secondary | ICD-10-CM | POA: Diagnosis not present

## 2016-06-14 NOTE — Progress Notes (Signed)
Results were reviewed and found to be: significantly abnormal  Surgical consultation is recommended  Review would suggest interventional pain management techniques may be of benefit 

## 2016-06-18 ENCOUNTER — Ambulatory Visit: Payer: 59 | Admitting: Pain Medicine

## 2016-06-23 ENCOUNTER — Telehealth: Payer: Self-pay | Admitting: *Deleted

## 2016-06-23 NOTE — Telephone Encounter (Signed)
Okay you can come in and talk to me whenever you want, about this.

## 2016-06-23 NOTE — Telephone Encounter (Signed)
Patient had MRI done on 06-11-2016. Will discuss with Dr. Dossie Arbour and call patient back with his advice.

## 2016-06-25 ENCOUNTER — Telehealth: Payer: Self-pay

## 2016-06-25 ENCOUNTER — Encounter: Payer: Self-pay | Admitting: Pain Medicine

## 2016-06-25 ENCOUNTER — Other Ambulatory Visit: Payer: Self-pay

## 2016-06-25 DIAGNOSIS — M5416 Radiculopathy, lumbar region: Secondary | ICD-10-CM

## 2016-06-25 NOTE — Telephone Encounter (Signed)
Please get patient a neurosurgery referral as soon as possible. Please refer to MRI. Thank you

## 2016-06-25 NOTE — Telephone Encounter (Signed)
Dr. Dossie Arbour                   Can you please review patient's MRI and advise on whether or not he needs to come in for eval/procedure or see PCP/ ED. Thank you- Anderson Malta

## 2016-06-26 NOTE — Telephone Encounter (Signed)
Voicemail left with patient that referral has been made and that hopefully he will hear something by the first of the week.

## 2016-06-29 ENCOUNTER — Encounter: Payer: 59 | Admitting: Pain Medicine

## 2016-07-01 DIAGNOSIS — M5416 Radiculopathy, lumbar region: Secondary | ICD-10-CM | POA: Diagnosis not present

## 2016-07-01 DIAGNOSIS — I10 Essential (primary) hypertension: Secondary | ICD-10-CM | POA: Diagnosis not present

## 2016-07-01 DIAGNOSIS — Z6836 Body mass index (BMI) 36.0-36.9, adult: Secondary | ICD-10-CM | POA: Diagnosis not present

## 2016-07-02 ENCOUNTER — Encounter: Payer: 59 | Admitting: Pain Medicine

## 2016-07-03 DIAGNOSIS — Z01812 Encounter for preprocedural laboratory examination: Secondary | ICD-10-CM | POA: Diagnosis not present

## 2016-07-03 DIAGNOSIS — M5416 Radiculopathy, lumbar region: Secondary | ICD-10-CM | POA: Diagnosis not present

## 2016-07-07 DIAGNOSIS — M5416 Radiculopathy, lumbar region: Secondary | ICD-10-CM | POA: Diagnosis not present

## 2016-07-07 DIAGNOSIS — M5127 Other intervertebral disc displacement, lumbosacral region: Secondary | ICD-10-CM | POA: Diagnosis not present

## 2016-07-07 DIAGNOSIS — M5117 Intervertebral disc disorders with radiculopathy, lumbosacral region: Secondary | ICD-10-CM | POA: Diagnosis not present

## 2016-07-14 NOTE — Telephone Encounter (Signed)
I reviewed the patient's MRI and ...  IMPRESSION: 1. 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 2. L4-5 left foraminal protrusion with L4 contact but no compression. 3. 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  Based on these results, please make the necessary arrangements for him to see a neurosurgeon for possible decompression.

## 2016-07-15 NOTE — Telephone Encounter (Signed)
LVM for patient to call us back re: MRI results.

## 2016-08-28 ENCOUNTER — Encounter: Payer: Self-pay | Admitting: Cardiovascular Disease

## 2016-08-28 ENCOUNTER — Ambulatory Visit (INDEPENDENT_AMBULATORY_CARE_PROVIDER_SITE_OTHER): Payer: 59 | Admitting: Cardiovascular Disease

## 2016-08-28 VITALS — BP 128/100 | HR 84 | Ht 74.0 in | Wt 280.0 lb

## 2016-08-28 DIAGNOSIS — E2740 Unspecified adrenocortical insufficiency: Secondary | ICD-10-CM | POA: Diagnosis not present

## 2016-08-28 DIAGNOSIS — I10 Essential (primary) hypertension: Secondary | ICD-10-CM | POA: Diagnosis not present

## 2016-08-28 DIAGNOSIS — G4733 Obstructive sleep apnea (adult) (pediatric): Secondary | ICD-10-CM | POA: Diagnosis not present

## 2016-08-28 NOTE — Progress Notes (Signed)
Cardiology Office Note   Date:  08/28/2016   ID:  Dean Ford, DOB [birth year], MRN [id number]  PCP:  Keith Rake, MD  Cardiologist:   Mertie Moores, MD   Chief Complaint  Patient presents with  . Hypertension   1. Hypertension 2. Chest pain - had a negative stress echo in Jan. 2016 3. Hypothyroidism 4.  Obstructive sleep apnea 5.  Adrenal insufficiency   Dean Ford is a 41 who is referred by Dr. Debbora Dus for evaluation of his CP. He has a hx of HTN and also has hypoadrenalism, hypothyroidism, low testosterone.  CP is just left of center. Pain seems to be related to emotional stress. Starts with dyspnea. He would typically get shortness of breath, head ache and then get chest pain. Would take another losartan and all the symptoms would improve after an hour. Associated with exertion - walking . Seems to be worse in the afternoon  He snores at night. Wakes up gasping for air frequently. Has been referred for sleep study. He did not make the appt.  Does not get any regular exercise.  Has low energy due to his endocrine issues. Has had a head MRI. pituitary gland was ok. Has had cholesterol checked in the past - was borderline years ago.  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 Was not able to complete his last training session because of shortness of breath.   September 20, 2014:  Dean Ford is a 41 y.o. male who presents for follow-up for his chest pain. He had an echocardiogram that revealed normal left ventricle systolic function. He had a stress echo ( results are not found at this time) He has not been on his BP med.   BP has been higher .    No further CP,  Not exercising at all.   December 13, 2014:  Dean Ford had some chest pain - mid sternal, radiated through his back Worsened with deep breath / deep exhale Went to the [facility]  ER  Work up was negative  Has felt fine since that time.   Has been working out vigorously without any chest   December 05, 2015 Dean Ford is seen for follow up .   Has had elevated BP . Has been painting his bathroom, gets sweaty, red faced. Has some chest tightness.  Has symptoms of sleep apnea. Snores, wakes up snoring,  Not getting any other exercise  Trying to stay away from fast foods.  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  Has gained 9 lbs in the past 6 months    Oct. 17, 2017: Dean Ford is seen back today  He was diagnosed with adrenal insufficiency approximate one year ago. He's been on Cortef and Florinef since that time. Recently he's been having problems with high blood pressures. He tries to watch his salt intake. 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  Feels dehydrated all of the time.   Drinks lots of fluids all day long and then has to urinate all night long .   Jan. 12, 2018:   Dean Ford is seen today for follow-up visit. Has gained weight - 10 lbs since Oct. 2017. Had microdisc surgery 8 weeks  ago. He had a ruptured disc between L5 and S1. He's back to work. He's not necessarily exercising much.  He takes the Florinef every 2-3 days .  Has cut out lots of his salty snacks  Has a poor appetite recently .   Has OSA,  Is going to get CPAP within the month     Wt Readings from Last 3 Encounters:  08/28/16 280 lb (127 kg)  06/10/16 270 lb (122.5 kg)  06/02/16 270 lb 9.6 oz (122.7 kg)      Past Medical History:  Diagnosis Date  . Adrenal insufficiency ([place])   . Anxiety   . Arthritis   . Back pain, chronic 06/03/2015  . Hypertension   . Hypothyroid   . Hypothyroid   . Sleep apnea   . Thyroid disease     Past Surgical History:  Procedure Laterality Date  . ELBOW SURGERY     right elbow     Current Outpatient Prescriptions  Medication  Sig Dispense Refill  . atorvastatin (LIPITOR) 40 MG tablet Take 1 tablet (40 mg total) by mouth daily. 30 tablet 11  . carvedilol (COREG) 6.25 MG tablet Take 1 tablet (6.25 mg total) by mouth 2 (two) times daily. 180 tablet 3  . cyclobenzaprine (FLEXERIL) 10 MG tablet Take 1 tablet (10 mg total) by mouth 3 (three) times daily as needed for muscle spasms. Maximum: 2/day 90 tablet 5  . fludrocortisone (FLORINEF) 0.1 MG tablet Take 0.1 mg by mouth daily as needed for dizziness.    . hydrochlorothiazide (HYDRODIURIL) 25 MG tablet Take 1 tablet (25 mg total) by mouth daily. 90 tablet 3  . hydrocortisone (CORTEF) 10 MG tablet Take 10 mg by mouth daily.     Marland Kitchen levothyroxine (SYNTHROID, LEVOTHROID) 75 MCG tablet Take 75 mcg by mouth daily before breakfast.     . potassium chloride (K-DUR) 10 MEQ tablet Take 1 tablet (10 mEq total) by mouth daily. 90 tablet 3  . testosterone cypionate (DEPOTESTOTERONE CYPIONATE) 200 MG/ML injection Inject 100 mg into the muscle every 14 (fourteen) days.     . traMADol (ULTRAM) 50 MG tablet Take 1-2 tablets (50-100 mg total) by mouth every 6 (six) hours as needed for severe pain. 240 tablet 5  . valsartan (DIOVAN) 320 MG tablet Take 1 tablet (320 mg total) by mouth daily. 30 tablet 11  . VIAGRA 50 MG tablet TAKE ONE TABLET BY MOUTH AS NEEDED FOR ERECTILE DYSFUNCTION 10 tablet 0   No current facility-administered medications for this visit.     Allergies:   Borax    Social History:  The patient  reports that he has quit smoking. He has quit using smokeless tobacco. He reports that he does not drink alcohol or use drugs.   Family History:  The patient's family history includes Heart attack (age of onset: 75) in his father; Hypertension in his father.    ROS:  Please see the history of present illness.    Review of Systems: Constitutional:  denies fever, chills, diaphoresis, appetite change and fatigue.  HEENT: denies photophobia, eye pain, redness, hearing loss, ear  pain, congestion, sore throat, rhinorrhea, sneezing, neck pain, neck stiffness and tinnitus.  Respiratory: denies SOB, DOE, cough, chest tightness, and wheezing.  Cardiovascular: denies chest pain, palpitations and leg swelling.  Gastrointestinal: denies nausea, vomiting, abdominal pain, diarrhea, constipation, blood in stool.  Genitourinary: denies dysuria, urgency, frequency, hematuria, flank pain and difficulty urinating.  Musculoskeletal: denies  myalgias, back pain, joint swelling, arthralgias and  gait problem.   Skin: denies pallor, rash and wound.  Neurological: denies dizziness, seizures, syncope, weakness, light-headedness, numbness and headaches.   Hematological: denies adenopathy, easy bruising, personal or family bleeding history.  Psychiatric/ Behavioral: denies suicidal ideation, mood changes, confusion, nervousness, sleep disturbance and agitation.       All other systems are reviewed and negative.    PHYSICAL EXAM: VS:  BP (!) 128/100 (BP Location: Right Arm, Patient Position: Sitting, Cuff Size: Large)   Pulse 84   Ht 6\' 2"  (1.88 m)   Wt 280 lb (127 kg)   SpO2 96%   BMI 35.95 kg/m  , BMI Body mass index is 35.95 kg/m. GEN: Well nourished, well developed, in no acute distress  HEENT: normal  Neck: no JVD, carotid bruits, or masses Cardiac: RRR;  mmid systolic click  no murmurs, rubs, or gallops,no edema  Respiratory:  clear to auscultation bilaterally, normal work of breathing GI: soft, nontender, nondistended, + BS MS: no deformity or atrophy  Skin: warm and dry, no rash Neuro:  Strength and sensation are intact Psych: normal   EKG:  EKG is ordered today.  ECG reveals:  NSR at 84 .  NS ST abn.  No acute changes     Recent Labs: 03/12/2016: ALT 36; BUN 11; Creat 1.04; Potassium 4.0; Sodium 137    Lipid Panel    Component Value Date/Time   CHOL 185 03/12/2016 0755   TRIG 290 (H) 03/12/2016 0755   HDL 30 (L) 03/12/2016 0755   CHOLHDL 6.2 (H)  03/12/2016 0755   VLDL 58 (H) 03/12/2016 0755   LDLCALC 97 03/12/2016 0755      Wt Readings from Last 3 Encounters:  08/28/16 280 lb (127 kg)  06/10/16 270 lb (122.5 kg)  06/02/16 270 lb 9.6 oz (122.7 kg)      Other studies Reviewed: Additional studies/ records that were reviewed today include:  Stress echo and resting echo . Review of the above records demonstrates: normal LV function    ASSESSMENT AND PLAN:  1. Hypertension - he presents today frustrated about his BP variability. He takes Florinef and cortef for his adrenal insufficiency . We will discontinue his HCTZ and potassium. He needs to work on a better diet, exercise, and weight loss program. We discussed this in great detail.  2. Chest pain- no further CP   3. Hypothyroidism - continue meds  4. ? Obstructive sleep apnea - moderate, has not been using the CPAP. This is also is contributing to his HTN  5. Adrenal insufficiency:. He's followed by an endocrinologist. Continue current dose of Cortef. Change florinef to  PRN    Current medicines are reviewed at length with the patient today.  The patient does not have concerns regarding medicines.  The following changes have been made: No changes.  Total time spent in patient care - 50 mintues   Disposition:   FU with me in 6  months.     Signed, Mertie Moores, MD  08/28/2016 9:57 AM    [facility] [place], [place], [place]  [address] Phone: [phone]; Fax: [phone]

## 2016-08-28 NOTE — Patient Instructions (Signed)
Medication Instructions:  STOP HCTZ (Hydrochlorothiazide) STOP Kdur (potassium)    Labwork: None Ordered   Testing/Procedures: None Ordered   Follow-Up: Your physician wants you to follow-up in: 6 months with Dr. Acie Fredrickson.  You will receive a reminder letter in the mail two months in advance. If you don't receive a letter, please call our office to schedule the follow-up appointment.   If you need a refill on your cardiac medications before your next appointment, please call your pharmacy.   Thank you for choosing [facility]! Christen Bame, RN [phone]

## 2016-09-08 DIAGNOSIS — G4733 Obstructive sleep apnea (adult) (pediatric): Secondary | ICD-10-CM | POA: Diagnosis not present

## 2016-09-24 ENCOUNTER — Telehealth: Payer: Self-pay | Admitting: *Deleted

## 2016-09-24 DIAGNOSIS — G4733 Obstructive sleep apnea (adult) (pediatric): Secondary | ICD-10-CM

## 2016-09-24 NOTE — Telephone Encounter (Signed)
-----   Message from Sueanne Margarita, MD sent at 09/24/2016 12:37 PM EST ----- Please set CPAP on 14cm H2O and get a download in 2 weeks

## 2016-09-25 ENCOUNTER — Other Ambulatory Visit: Payer: Self-pay | Admitting: Family Medicine

## 2016-09-25 DIAGNOSIS — N529 Male erectile dysfunction, unspecified: Secondary | ICD-10-CM

## 2016-10-09 DIAGNOSIS — G4733 Obstructive sleep apnea (adult) (pediatric): Secondary | ICD-10-CM | POA: Diagnosis not present

## 2016-10-14 ENCOUNTER — Other Ambulatory Visit: Payer: Self-pay | Admitting: Neurosurgery

## 2016-10-14 DIAGNOSIS — M5416 Radiculopathy, lumbar region: Secondary | ICD-10-CM

## 2016-10-15 ENCOUNTER — Ambulatory Visit
Admission: RE | Admit: 2016-10-15 | Discharge: 2016-10-15 | Disposition: A | Discharge status: Home / Self Care | Payer: 59 | Source: Ambulatory Visit | Attending: Neurosurgery | Admitting: Neurosurgery

## 2016-10-15 DIAGNOSIS — M5416 Radiculopathy, lumbar region: Secondary | ICD-10-CM | POA: Diagnosis not present

## 2016-10-15 DIAGNOSIS — R937 Abnormal findings on diagnostic imaging of other parts of musculoskeletal system: Secondary | ICD-10-CM | POA: Insufficient documentation

## 2016-10-15 DIAGNOSIS — M5126 Other intervertebral disc displacement, lumbar region: Secondary | ICD-10-CM | POA: Diagnosis not present

## 2016-10-15 LAB — POCT I-STAT CREATININE: Creatinine, Ser: 1 mg/dL (ref 0.61–1.24)

## 2016-10-15 MED ORDER — GADOBENATE DIMEGLUMINE 529 MG/ML IV SOLN
20.0000 mL | Freq: Once | INTRAVENOUS | Status: AC | PRN
  Administered 2016-10-15: 20 mL via INTRAVENOUS

## 2016-10-20 DIAGNOSIS — M5416 Radiculopathy, lumbar region: Secondary | ICD-10-CM | POA: Diagnosis not present

## 2016-10-22 ENCOUNTER — Encounter: Payer: Self-pay | Admitting: Cardiology

## 2016-10-22 DIAGNOSIS — Z01812 Encounter for preprocedural laboratory examination: Secondary | ICD-10-CM | POA: Diagnosis not present

## 2016-10-22 DIAGNOSIS — M5416 Radiculopathy, lumbar region: Secondary | ICD-10-CM | POA: Diagnosis not present

## 2016-10-26 ENCOUNTER — Ambulatory Visit
Admission: RE | Admit: 2016-10-26 | Discharge: 2016-10-26 | Disposition: A | Discharge status: Home / Self Care | Payer: 59 | Source: Ambulatory Visit | Attending: Neurosurgery | Admitting: Neurosurgery

## 2016-10-26 DIAGNOSIS — M5117 Intervertebral disc disorders with radiculopathy, lumbosacral region: Secondary | ICD-10-CM | POA: Diagnosis not present

## 2016-10-26 DIAGNOSIS — Z9889 Other specified postprocedural states: Secondary | ICD-10-CM | POA: Diagnosis not present

## 2016-10-26 DIAGNOSIS — M5127 Other intervertebral disc displacement, lumbosacral region: Secondary | ICD-10-CM | POA: Diagnosis not present

## 2016-10-26 DIAGNOSIS — M5416 Radiculopathy, lumbar region: Secondary | ICD-10-CM | POA: Diagnosis not present

## 2016-11-03 DIAGNOSIS — G4733 Obstructive sleep apnea (adult) (pediatric): Secondary | ICD-10-CM | POA: Diagnosis not present

## 2016-11-06 DIAGNOSIS — G4733 Obstructive sleep apnea (adult) (pediatric): Secondary | ICD-10-CM | POA: Diagnosis not present

## 2016-11-08 ENCOUNTER — Encounter: Payer: Self-pay | Admitting: Cardiology

## 2016-11-11 ENCOUNTER — Ambulatory Visit: Payer: 59 | Admitting: Cardiology

## 2016-11-15 ENCOUNTER — Encounter: Payer: Self-pay | Admitting: Cardiology

## 2016-11-17 NOTE — Progress Notes (Signed)
Cardiology Office Note    Date:  11/18/2016   ID:  Dean Ford, DOB [birth year], MRN [id number]  PCP:  Keith Rake, MD  Cardiologist:  Fransico Him, MD   Chief Complaint  Patient presents with  . Sleep Apnea  . Hypertension    History of Present Illness:  Dean Ford is a 41 y.o. male with a history of HTN who was referred by Dr. Acie Fredrickson for evaluation of OSA.  He was having problems with snoring, witnessed apneas excessive daytime sleepiness and with underlying obesity with a BMI of 37, he was referred for sleep study which showed moderate OSA with an AHI of 20/hr with oxygen desaturations as low as 75% and loud snoring and underwent CPAP titration and ultimately was set on 14cm H2O.  He is now here for evaluation.  He is doing well with his device . He tolerates the full face mask but  feels the pressure is too high and he says that the pressure is so high that it is causing them mask to leak.   Since going on PAP he feels more rested in the am and has less daytime sleepiness.  He does still snore some when using his CPAP but his son says that it is improved but is no longer waking up gasping for air.  He denies any significant mouth or nasal dryness and no significant nasal congestion .    Past Medical History:  Diagnosis Date  . Adrenal insufficiency ([place])   . Anxiety   . Arthritis   . Back pain, chronic 06/03/2015  . Hypertension   . Hypothyroid   . Sleep apnea   . Thyroid disease     Past Surgical History:  Procedure Laterality Date  . ELBOW SURGERY     right elbow    Current Medications: Current Meds  Medication Sig  . atorvastatin (LIPITOR) 40 MG tablet Take 1 tablet (40 mg total) by mouth daily.  . carvedilol (COREG) 6.25 MG tablet Take 1 tablet (6.25 mg total) by mouth 2 (two) times daily.  . cyclobenzaprine (FLEXERIL) 10 MG tablet Take 1 tablet (10 mg total) by mouth 3 (three) times daily as needed for muscle spasms. Maximum: 2/day  . fludrocortisone (FLORINEF)  0.1 MG tablet Take 0.1 mg by mouth daily as needed for dizziness.  . hydrocortisone (CORTEF) 10 MG tablet Take 10 mg by mouth daily.   Marland Kitchen levothyroxine (SYNTHROID, LEVOTHROID) 75 MCG tablet Take 75 mcg by mouth daily before breakfast.   . sildenafil (VIAGRA) 50 MG tablet Take 50 mg by mouth daily as needed for erectile dysfunction.  Marland Kitchen testosterone cypionate (DEPOTESTOTERONE CYPIONATE) 200 MG/ML injection Inject 100 mg into the muscle every 14 (fourteen) days.   . traMADol (ULTRAM) 50 MG tablet Take 1-2 tablets (50-100 mg total) by mouth every 6 (six) hours as needed for severe pain.  . valsartan (DIOVAN) 320 MG tablet Take 1 tablet (320 mg total) by mouth daily.    Allergies:   Borax   Social History   Social History  . Marital status: Married    Spouse name: N/A  . Number of children: N/A  . Years of education: N/A   Social History Main Topics  . Smoking status: Former Research scientist (life sciences)  . Smokeless tobacco: Former Systems developer  . Alcohol use No     Comment: rarely  . Drug use: No  . Sexual activity: Not Asked   Other Topics Concern  . None   Social History Narrative  .  None     Family History:  The patient's family history includes Heart attack (age of onset: 13) in his father; Hypertension in his father.   ROS:   Please see the history of present illness.    ROS All other systems reviewed and are negative.  No flowsheet data found.     PHYSICAL EXAM:   VS:  BP 140/90   Pulse (!) 112   Ht 6\' 2"  (1.88 m)   Wt 282 lb (127.9 kg)   BMI 36.21 kg/m    GEN: Well nourished, well developed, in no acute distress  HEENT: normal  Neck: no JVD, carotid bruits, or masses Cardiac: RRR; no murmurs, rubs, or gallops,no edema.  Intact distal pulses bilaterally.  Respiratory:  clear to auscultation bilaterally, normal work of breathing GI: soft, nontender, nondistended, + BS MS: no deformity or atrophy  Skin: warm and dry, no rash Neuro:  Alert and Oriented x 3, Strength and sensation are  intact Psych: euthymic mood, full affect  Wt Readings from Last 3 Encounters:  11/18/16 282 lb (127.9 kg)  08/28/16 280 lb (127 kg)  06/10/16 270 lb (122.5 kg)      Studies/Labs Reviewed:   EKG:  EKG is not ordered today.    Recent Labs: 03/12/2016: ALT 36; BUN 11; Potassium 4.0; Sodium 137 10/15/2016: Creatinine, Ser 1.00   Lipid Panel    Component Value Date/Time   CHOL 185 03/12/2016 0755   TRIG 290 (H) 03/12/2016 0755   HDL 30 (L) 03/12/2016 0755   CHOLHDL 6.2 (H) 03/12/2016 0755   VLDL 58 (H) 03/12/2016 0755   LDLCALC 97 03/12/2016 0755    Additional studies/ records that were reviewed today include:  CPAP download    ASSESSMENT:    1. Obstructive sleep apnea syndrome   2. Essential hypertension   3. Class 1 obesity due to excess calories with serious comorbidity in adult, unspecified BMI      PLAN:  In order of problems listed above:  OSA - the patient is tolerating PAP therapy well without any problems. The PAP download was reviewed today and showed an AHI of 3.4/hr on 16 cm H2O with 57% compliance in using more than 4 hours nightly.  The patient has been using and benefiting from CPAP use and will continue to benefit from therapy. His compliance is down due to problems with the mask and also has problems with insomnia.  He is going to get a nasal mask to see if that is an improvement from the full face mask. He says that he is not a mouth breather.  I will get another download in 4 weeks.  HTN - BP poorly controlled today.   He says at home it has been running the same.  He just took his BP meds before he came.  I will get a 24 hour BP cuff to assess control.  He only has a wrist BP monitor at home.   He will continue on valsartan.  He has only been using Coreg as needed for elevated BP instead of daily.  I have encouraged him to take the Coreg as prescribed BID daily especially since he is also mildly tachycardic.  Obesity - I have encouraged him to get into a  routine exercise program and cut back on carbs and portions.    Medication Adjustments/Labs and Tests Ordered: Current medicines are reviewed at length with the patient today.  Concerns regarding medicines are outlined above.  Medication changes, Labs and  Tests ordered today are listed in the Patient Instructions below.  There are no Patient Instructions on file for this visit.   Signed, Fransico Him, MD  11/18/2016 9:12 AM    [facility] [place], [place], [place]  [address] Phone: [phone]; Fax: [phone]

## 2016-11-18 ENCOUNTER — Ambulatory Visit (INDEPENDENT_AMBULATORY_CARE_PROVIDER_SITE_OTHER): Payer: 59 | Admitting: Cardiology

## 2016-11-18 ENCOUNTER — Encounter: Payer: Self-pay | Admitting: Cardiology

## 2016-11-18 VITALS — BP 140/90 | HR 112 | Ht 74.0 in | Wt 282.0 lb

## 2016-11-18 DIAGNOSIS — I10 Essential (primary) hypertension: Secondary | ICD-10-CM

## 2016-11-18 DIAGNOSIS — E6609 Other obesity due to excess calories: Secondary | ICD-10-CM

## 2016-11-18 DIAGNOSIS — G4733 Obstructive sleep apnea (adult) (pediatric): Secondary | ICD-10-CM

## 2016-11-18 NOTE — Patient Instructions (Signed)
Medication Instructions:  Your physician recommends that you continue on your current medications as directed. Please refer to the Current Medication list given to you today.   Labwork: none  Testing/Procedures: Your physician has requested that you wear a 24-hour blood pressure monitor.    Follow-Up: Your physician wants you to follow-up in: 12 months with Dr. Radford Pax. You will receive a reminder letter in the mail two months in advance. If you don't receive a letter, please call our office to schedule the follow-up appointment.   Any Other Special Instructions Will Be Listed Below (If Applicable).  You will need to do a CPAP download in 4 weeks.   If you need a refill on your cardiac medications before your next appointment, please call your pharmacy.

## 2016-11-18 NOTE — Progress Notes (Signed)
Patient's Name: Dean Ford  MRN: [id number]  Referring Provider: Roselee Nova, MD  DOB: December 02, 1975  PCP: Roselee Nova, MD  DOS: 11/19/2016  Note by: Kathlen Brunswick. Dossie Arbour, MD  Service setting: Ambulatory outpatient  Specialty: Interventional Pain Management  Location: [facility] (AMB) Pain Management Facility    Patient type: Established   Primary Reason(s) for Visit: Encounter for prescription drug management (Level of risk: moderate) CC: Back Pain (lower)  HPI  Dean Ford is a 41 y.o. year old, male patient, who comes today for a medication management evaluation. 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[place]); Anxiety disorder; Dehydration symptoms; Dyslipidemia; Encounter for therapeutic drug level monitoring; Long term current use of opiate analgesic; Uncomplicated opioid dependence ([place]); Opiate use; Lumbar spondylosis; Lumbar facet syndrome (Bilateral) (R>L); Insomnia, persistent; Intermittent muscle cramps; Always thirsty; Avitaminosis D; Lumbar spine pain; Hip arthrosis; Obesity; DDD (degenerative disc disease), lumbar; L4-L5 disc bulge; Ligamentum flavum hypertrophy (HCC); Bulging lumbar disc; Sleep apnea; Chronic sacroiliac joint pain (Right); Lumbar facet hypertrophy; Lumbosacral radiculopathy at S1 (right-sided); Chronic pain; Long term prescription opiate use; Encounter for chronic pain management; Chronic low back pain (Location of Primary Source of Pain) (R>L); Erectile dysfunction; Spasm of paraspinal muscle; Lower extremity numbness (Bilateral) (R>L); Chronic lumbar radicular pain (right S1 and bilateral L5) (Location of Secondary source of pain) (Bilateral) (R>L); Chronic hip pain (Right); Osteoarthritis of hip (Right); Acute lumbar radiculopathy (Right) (S1); and 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 His primarily concern today is the Back Pain (lower)  Pain Assessment: Self-Reported  Pain Score: 2 /10             Reported level is compatible with observation.       Pain Type: Chronic pain Pain Location: Back Pain Orientation: Lower Pain Descriptors / Indicators: Aching Pain Frequency: Intermittent  Dean Ford was last scheduled for an appointment on 06/10/2016 for medication management. During today's appointment we reviewed Dean Ford chronic pain status, as well as his outpatient medication regimen.  The patient  reports that he does not use drugs. His body mass index is 35.95 kg/m.  Further details on both, my assessment(s), as well as the proposed treatment plan, please see below.  Controlled Substance Pharmacotherapy Assessment REMS (Risk Evaluation and Mitigation Strategy)  Analgesic:Tramadol 100 mg every 6 hours (400 mg/day of tramadol) + Hydrocodone/APAP 5/325 one tablet by mouth 4 times a day (20 mg/day) MME/day:60 mg/day.  Evon Slack, RN  11/19/2016  8:19 AM  Sign at close encounter Nursing Pain Medication Assessment:  Safety precautions to be maintained throughout the outpatient stay will include: orient to surroundings, keep bed in low position, maintain call bell within reach at all times, provide assistance with transfer out of bed and ambulation.  Medication Inspection Compliance: Pill count conducted under aseptic conditions, in front of the patient. Neither the pills nor the bottle was removed from the patient's sight at any time. Once count was completed pills were immediately returned to the patient in their original bottle.  Medication: Tramadol (Ultram) Pill/Patch Count: 233.5 of 240 pills remain Pill/Patch Appearance: Markings consistent with prescribed medication Bottle Appearance: Standard pharmacy container. Clearly labeled. Filled Date: 03 / 28 / 2018 Last Medication intake:  Today   Pharmacokinetics: Liberation and absorption (onset of action): WNL Distribution (time to peak effect): WNL Metabolism and excretion (duration of action):  WNL         Pharmacodynamics: Desired effects: Analgesia: Mr.  Ford reports >50% benefit. Functional ability: Patient reports that medication allows him to accomplish basic ADLs Clinically meaningful improvement in function (CMIF): Sustained CMIF goals met Perceived effectiveness: Described as relatively effective, allowing for increase in activities of daily living (ADL) Undesirable effects: Side-effects or Adverse reactions: None reported Monitoring: [place] PMP: Online review of the past 49-monthperiod conducted. Compliant with practice rules and regulations List of all UDS test(s) done:  Lab Results  Component Value Date   TOXASSSELUR FINAL 01/22/2016   Last UDS on record: Tox[employer]ure Select 13  Date Value Ref Range Status  01/22/2016 FINAL  Final    Comment:    ==================================================================== TOXASSURE SELECT 13 (MW) ==================================================================== Test                             Result       Flag       Units Drug Present and Declared for Prescription Verification   Tramadol                       PRESENT      EXPECTED   O-Desmethyltramadol            PRESENT      EXPECTED   N-Desmethyltramadol            PRESENT      EXPECTED    Source of tramadol is a prescription medication.    O-desmethyltramadol and N-desmethyltramadol are expected    metabolites of tramadol. ==================================================================== Test                      Result    Flag   Units      Ref Range   Creatinine              264              mg/dL      >=20 ==================================================================== Declared Medications:  The flagging and interpretation on this report are based on the  following declared medications.  Unexpected results may arise from  inaccuracies in the declared medications.  **Note: The testing scope of this panel includes these medications:  Tramadol (Ultram)   **Note: The testing scope of this panel does not include following  reported medications:  Atorvastatin (Lipitor)  Cyclobenzaprine (Flexeril)  Fludrocortisone (Florinef)  Hydrochlorothiazide (Hydrodiuril)  Hydrocortisone (Cortef)  Levothyroxine  Losartan (Cozaar)  Potassium (K-Dur)  Sildenafil (Viagra)  Testosterone ==================================================================== For clinical consultation, please call ([phone] ====================================================================    UDS interpretation: Compliant          Medication Assessment Form: Reviewed. Patient indicates being compliant with therapy Treatment compliance: Compliant Risk Assessment Profile: Aberrant behavior: See prior evaluations. None observed or detected today Comorbid factors increasing risk of overdose: See prior notes. No additional risks detected today Risk of substance use disorder (SUD): Low Opioid Risk Tool (ORT) Total Score: 0  Interpretation Table:  Score <3 = Low Risk for SUD  Score between 4-7 = Moderate Risk for SUD  Score >8 = High Risk for Opioid Abuse   Risk Mitigation Strategies:  Patient Counseling: Covered Patient-Prescriber Agreement (PPA): Present and active  Notification to other healthcare providers: Done  Pharmacologic Plan: No change in therapy, at this time  Laboratory Chemistry  Inflammation Markers No results found for: CRP, ESRSEDRATE (CRP: Acute Phase) (ESR: Chronic Phase) Renal Function Markers Lab Results  Component Value Date   BUN 11  03/12/2016   CREATININE 1.00 10/15/2016   GFRAA 124 03/01/2015   GFRNONAA 107 03/01/2015   Hepatic Function Markers Lab Results  Component Value Date   AST 26 03/12/2016   ALT 36 03/12/2016   ALBUMIN 4.3 03/12/2016   ALKPHOS 44 03/12/2016   Electrolytes Lab Results  Component Value Date   NA 137 03/12/2016   K 4.0 03/12/2016   CL 102 03/12/2016   CALCIUM 9.3 03/12/2016   MG 2.1 12/19/2012    Neuropathy Markers No results found for: [id number] Bone Pathology Markers Lab Results  Component Value Date   ALKPHOS 44 03/12/2016   CALCIUM 9.3 03/12/2016   Coagulation Parameters Lab Results  Component Value Date   PLT 238 03/01/2015   Cardiovascular Markers Lab Results  Component Value Date   HGB 15.9 12/04/2014   HCT 46.0 03/01/2015   Note: Lab results reviewed.  Recent Diagnostic Imaging Review  No results found. Note: Imaging results reviewed.          Meds  The patient has a current medication list which includes the following prescription(s): atorvastatin, carvedilol, fludrocortisone, hydrocortisone, levothyroxine, sildenafil, testosterone cypionate, tramadol, and valsartan.  Current Outpatient Prescriptions on File Prior to Visit  Medication Sig  . atorvastatin (LIPITOR) 40 MG tablet Take 1 tablet (40 mg total) by mouth daily.  . carvedilol (COREG) 6.25 MG tablet Take 1 tablet (6.25 mg total) by mouth 2 (two) times daily.  . fludrocortisone (FLORINEF) 0.1 MG tablet Take 0.1 mg by mouth daily as needed for dizziness.  . hydrocortisone (CORTEF) 10 MG tablet Take 10 mg by mouth daily.   Marland Kitchen levothyroxine (SYNTHROID, LEVOTHROID) 75 MCG tablet Take 75 mcg by mouth daily before breakfast.   . sildenafil (VIAGRA) 50 MG tablet Take 50 mg by mouth daily as needed for erectile dysfunction.  Marland Kitchen testosterone cypionate (DEPOTESTOTERONE CYPIONATE) 200 MG/ML injection Inject 100 mg into the muscle every 14 (fourteen) days.   . valsartan (DIOVAN) 320 MG tablet Take 1 tablet (320 mg total) by mouth daily.   No current facility-administered medications on file prior to visit.    ROS  Constitutional: Denies any fever or chills Gastrointestinal: No reported hemesis, hematochezia, vomiting, or acute GI distress Musculoskeletal: Denies any acute onset joint swelling, redness, loss of ROM, or weakness Neurological: No reported episodes of acute onset apraxia, aphasia, dysarthria,  agnosia, amnesia, paralysis, loss of coordination, or loss of consciousness  Allergies  Mr. Heslin is allergic to borax.  PFSH  Drug: Mr. Ureta  reports that he does not use drugs. Alcohol:  reports that he does not drink alcohol. Tobacco:  reports that he has quit smoking. He has quit using smokeless tobacco. Medical:  has a past medical history of Adrenal insufficiency ([place]); Anxiety; Arthritis; Back pain, chronic (06/03/2015); Hypertension; Hypothyroid; Sleep apnea; and Thyroid disease. Family: family history includes Heart attack (age of onset: 64) in his father; Hypertension in his father.  Past Surgical History:  Procedure Laterality Date  . ELBOW SURGERY     right elbow   Constitutional Exam  General appearance: Well nourished, well developed, and well hydrated. In no apparent acute distress Vitals:   11/19/16 0757  BP: 127/84  Pulse: 85  Resp: 16  Temp: 98.2 F (36.8 C)  TempSrc: Oral  SpO2: 97%  Weight: 280 lb (127 kg)  Height: 6' 2"  (1.88 m)   BMI Assessment: Estimated body mass index is 35.95 kg/m as calculated from the following:   Height as of this encounter: 6' 2"  (  1.88 m).   Weight as of this encounter: 280 lb (127 kg).  BMI interpretation table: BMI level Category Range association with higher incidence of chronic pain  <18 kg/m2 Underweight   18.5-24.9 kg/m2 Ideal body weight   25-29.9 kg/m2 Overweight Increased incidence by 20%  30-34.9 kg/m2 Obese (Class I) Increased incidence by 68%  35-39.9 kg/m2 Severe obesity (Class II) Increased incidence by 136%  >40 kg/m2 Extreme obesity (Class III) Increased incidence by 254%   BMI Readings from Last 4 Encounters:  11/19/16 35.95 kg/m  11/18/16 36.21 kg/m  08/28/16 35.95 kg/m  06/10/16 34.67 kg/m   Wt Readings from Last 4 Encounters:  11/19/16 280 lb (127 kg)  11/18/16 282 lb (127.9 kg)  08/28/16 280 lb (127 kg)  06/10/16 270 lb (122.5 kg)  Psych/Mental status: Alert, oriented x 3 (person, place, &  time)       Eyes: PERLA Respiratory: No evidence of acute respiratory distress  Cervical Spine Exam  Inspection: No masses, redness, or swelling Alignment: Symmetrical Functional ROM: Unrestricted ROM Stability: No instability detected Muscle strength & Tone: Functionally intact Sensory: Unimpaired Palpation: No palpable anomalies  Upper Extremity (UE) Exam    Side: Right upper extremity  Side: Left upper extremity  Inspection: No masses, redness, swelling, or asymmetry. No contractures  Inspection: No masses, redness, swelling, or asymmetry. No contractures  Functional ROM: Unrestricted ROM          Functional ROM: Unrestricted ROM          Muscle strength & Tone: Functionally intact  Muscle strength & Tone: Functionally intact  Sensory: Unimpaired  Sensory: Unimpaired  Palpation: No palpable anomalies  Palpation: No palpable anomalies  Specialized Test(s): Deferred         Specialized Test(s): Deferred          Thoracic Spine Exam  Inspection: No masses, redness, or swelling Alignment: Symmetrical Functional ROM: Unrestricted ROM Stability: No instability detected Sensory: Unimpaired Muscle strength & Tone: No palpable anomalies  Lumbar Spine Exam  Inspection: Well healed scar from previous spine surgery detected Alignment: Symmetrical Functional ROM: Adequate ROM Stability: No instability detected Muscle strength & Tone: Functionally intact Sensory: Movement-associated discomfort Palpation: No palpable anomalies Provocative Tests: Lumbar Hyperextension and rotation test: Positive on the right for facet joint pain. Patrick's Maneuver: evaluation deferred today              Gait & Posture Assessment  Ambulation: Unassisted Gait: Relatively normal for age and body habitus Posture: WNL   Lower Extremity Exam    Side: Right lower extremity  Side: Left lower extremity  Inspection: No masses, redness, swelling, or asymmetry. No contractures  Inspection: No masses,  redness, swelling, or asymmetry. No contractures  Functional ROM: Unrestricted ROM          Functional ROM: Unrestricted ROM          Muscle strength & Tone: Functionally intact  Muscle strength & Tone: Functionally intact  Sensory: Unimpaired  Sensory: Unimpaired  Palpation: No palpable anomalies  Palpation: No palpable anomalies   Assessment  Primary Diagnosis & Pertinent Problem List: The primary encounter diagnosis was Chronic low back pain (Location of Primary Source of Pain) (R>L). 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  Status Diagnosis  Controlled Controlled Controlled 1. Chronic low back pain (Location of Primary Source of Pain) (R>L)   2. Chronic lumbar radicular pain (right S1 and bilateral L5) (Location of Secondary source of pain) (Bilateral) (R>L)   3. Lumbosacral radiculopathy at S1 (right-sided)   4. Chronic sacroiliac joint pain (Right)   5. Lumbar facet syndrome (Bilateral) (R>L)   6. Lumbar spondylosis   7. Long term prescription opiate use   8. Opiate use   9. Chronic pain syndrome   10. Spasm of paraspinal muscle   11. 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 traMADol (ULTRAM) 50 MG tablet    Sig: Take 1-2 tablets (50-100 mg total) by mouth every 6 (six) hours as needed for severe pain.    Dispense:  240 tablet    Refill:  5    Fill one day early if pharmacy is closed on scheduled  refill date. Do not refill any sooner than every 30 days. Do not fill until: 11/18/16 To last until: 05/17/17  . DISCONTD: cyclobenzaprine (FLEXERIL) 10 MG tablet    Sig: Take 1 tablet (10 mg total) by mouth 3 (three) times daily as needed for muscle spasms.    Dispense:  90 tablet    Refill:  5    Do not place medication on "Automatic Refill". Fill one day early if pharmacy is closed on scheduled refill date.   New Prescriptions   No medications on file   Medications administered today: Mr. Istre had no medications administered during this visit. Lab-work, procedure(s), and/or referral(s): Orders Placed This Encounter  Procedures  . Caudal Epidural Injection  . LUMBAR FACET(MEDIAL BRANCH NERVE BLOCK) MBNB  . Radiofrequency,Lumbar   Imaging and/or referral(s): None  Interventional therapies: Planned, scheduled, and/or pending:   None at this time.   Considering:   Caudal epidural steroid injection  Lumbar epidural steroid injection  Right lumbar facet block  Possible lumbar facet radiofrequency ablation.    Palliative PRN treatment(s):   Caudal epidural steroid injection Right L5-S1 lumbar epidural steroid injection  Palliative right lumbar facet block  Right lumbar facet RFA Right-sided sacroiliac joint block  Right-sided intra-articular hip joint injection    Provider-requested follow-up: Return in about 6 months (around 05/21/2017) for (Nurse Practitioner) Med-Mgmt, in addition, (PRN) procedure.  Future Appointments Date Time Provider [place]  12/08/2016 10:30 AM [facility]   Primary Care Physician: Roselee Nova, MD Location: [facility] Note by: Kathlen Brunswick. Dossie Arbour, M.D, DABA, DABAPM, DABPM, DABIPP, FIPP Date: 11/19/2016; Time: 8:42 AM  Pain Score Disclaimer: We use the NRS-11 scale. This is a self-reported, subjective measurement of pain severity with only modest accuracy. It is used  primarily to identify changes within a particular patient. It must be understood that outpatient pain scales are significantly less accurate that those used for research, where they can be applied under ideal controlled circumstances with minimal exposure to variables. In reality, the score is likely to be a combination of pain intensity and pain affect, where pain affect describes the degree of emotional arousal or changes in action readiness caused by the sensory experience of pain. Factors such as social and work situation, setting, emotional state, anxiety levels, expectation, and prior pain experience may influence  pain perception and show large inter-individual differences that may also be affected by time variables.  Patient instructions provided during this appointment: There are no Patient Instructions on file for this visit.

## 2016-11-19 ENCOUNTER — Encounter: Payer: Self-pay | Admitting: Pain Medicine

## 2016-11-19 ENCOUNTER — Ambulatory Visit: Payer: 59 | Attending: Pain Medicine | Admitting: Pain Medicine

## 2016-11-19 VITALS — BP 127/84 | HR 85 | Temp 98.2°F | Resp 16 | Ht 74.0 in | Wt 280.0 lb

## 2016-11-19 DIAGNOSIS — G894 Chronic pain syndrome: Secondary | ICD-10-CM | POA: Diagnosis not present

## 2016-11-19 DIAGNOSIS — M47816 Spondylosis without myelopathy or radiculopathy, lumbar region: Secondary | ICD-10-CM | POA: Diagnosis not present

## 2016-11-19 DIAGNOSIS — M5441 Lumbago with sciatica, right side: Secondary | ICD-10-CM | POA: Diagnosis not present

## 2016-11-19 DIAGNOSIS — M47896 Other spondylosis, lumbar region: Secondary | ICD-10-CM | POA: Diagnosis not present

## 2016-11-19 DIAGNOSIS — Z79899 Other long term (current) drug therapy: Secondary | ICD-10-CM | POA: Insufficient documentation

## 2016-11-19 DIAGNOSIS — G8929 Other chronic pain: Secondary | ICD-10-CM

## 2016-11-19 DIAGNOSIS — F1190 Opioid use, unspecified, uncomplicated: Secondary | ICD-10-CM

## 2016-11-19 DIAGNOSIS — M5416 Radiculopathy, lumbar region: Secondary | ICD-10-CM | POA: Insufficient documentation

## 2016-11-19 DIAGNOSIS — M533 Sacrococcygeal disorders, not elsewhere classified: Secondary | ICD-10-CM | POA: Insufficient documentation

## 2016-11-19 DIAGNOSIS — Z87891 Personal history of nicotine dependence: Secondary | ICD-10-CM | POA: Insufficient documentation

## 2016-11-19 DIAGNOSIS — M1288 Other specific arthropathies, not elsewhere classified, other specified site: Secondary | ICD-10-CM

## 2016-11-19 DIAGNOSIS — M62830 Muscle spasm of back: Secondary | ICD-10-CM | POA: Diagnosis not present

## 2016-11-19 DIAGNOSIS — M961 Postlaminectomy syndrome, not elsewhere classified: Secondary | ICD-10-CM

## 2016-11-19 DIAGNOSIS — M545 Low back pain: Secondary | ICD-10-CM | POA: Insufficient documentation

## 2016-11-19 DIAGNOSIS — Z79891 Long term (current) use of opiate analgesic: Secondary | ICD-10-CM | POA: Diagnosis not present

## 2016-11-19 DIAGNOSIS — Z5181 Encounter for therapeutic drug level monitoring: Secondary | ICD-10-CM | POA: Insufficient documentation

## 2016-11-19 DIAGNOSIS — I10 Essential (primary) hypertension: Secondary | ICD-10-CM | POA: Insufficient documentation

## 2016-11-19 DIAGNOSIS — M5417 Radiculopathy, lumbosacral region: Secondary | ICD-10-CM

## 2016-11-19 MED ORDER — TRAMADOL HCL 50 MG PO TABS: 50.0000 mg | ORAL_TABLET | Freq: Four times a day (QID) | ORAL | 5 refills | Status: DC | PRN

## 2016-11-19 MED ORDER — CYCLOBENZAPRINE HCL 10 MG PO TABS: 10.0000 mg | ORAL_TABLET | Freq: Three times a day (TID) | ORAL | 5 refills | Status: DC | PRN

## 2016-11-19 NOTE — Progress Notes (Signed)
Nursing Pain Medication Assessment:  Safety precautions to be maintained throughout the outpatient stay will include: orient to surroundings, keep bed in low position, maintain call bell within reach at all times, provide assistance with transfer out of bed and ambulation.  Medication Inspection Compliance: Pill count conducted under aseptic conditions, in front of the patient. Neither the pills nor the bottle was removed from the patient's sight at any time. Once count was completed pills were immediately returned to the patient in their original bottle.  Medication: Tramadol (Ultram) Pill/Patch Count: 233.5 of 240 pills remain Pill/Patch Appearance: Markings consistent with prescribed medication Bottle Appearance: Standard pharmacy container. Clearly labeled. Filled Date: 03 / 28 / 2018 Last Medication intake:  Today

## 2016-12-07 DIAGNOSIS — G4733 Obstructive sleep apnea (adult) (pediatric): Secondary | ICD-10-CM | POA: Diagnosis not present

## 2016-12-08 ENCOUNTER — Ambulatory Visit (INDEPENDENT_AMBULATORY_CARE_PROVIDER_SITE_OTHER): Payer: 59

## 2016-12-08 ENCOUNTER — Encounter: Payer: Self-pay | Admitting: *Deleted

## 2016-12-08 DIAGNOSIS — I10 Essential (primary) hypertension: Secondary | ICD-10-CM

## 2016-12-08 DIAGNOSIS — G4733 Obstructive sleep apnea (adult) (pediatric): Secondary | ICD-10-CM

## 2016-12-08 NOTE — Progress Notes (Signed)
Patient ID: Dean Ford, male   DOB: Sep 06, 1975, 41 y.o.   MRN: [id number]  Patient did not show up for 12/08/16, 10:30 AM, appointment, to have a 24 hour ambulatory blood pressure monitor applied.

## 2016-12-08 NOTE — Progress Notes (Signed)
Patient ID: Dean Ford, male   DOB: 1975-10-21, 41 y.o.   MRN: [id number] Patient arrived 11:00 AM to have  24 hour ambulatory blood pressure monitor applied using adult large cuff.

## 2016-12-09 NOTE — Progress Notes (Signed)
Please call patient to reschedule.

## 2016-12-09 NOTE — Progress Notes (Signed)
Please reschedule

## 2017-01-05 DIAGNOSIS — I10 Essential (primary) hypertension: Secondary | ICD-10-CM | POA: Diagnosis not present

## 2017-01-05 DIAGNOSIS — E2740 Unspecified adrenocortical insufficiency: Secondary | ICD-10-CM | POA: Diagnosis not present

## 2017-01-05 DIAGNOSIS — Z7952 Long term (current) use of systemic steroids: Secondary | ICD-10-CM | POA: Diagnosis not present

## 2017-01-05 DIAGNOSIS — E291 Testicular hypofunction: Secondary | ICD-10-CM | POA: Diagnosis not present

## 2017-01-05 DIAGNOSIS — E039 Hypothyroidism, unspecified: Secondary | ICD-10-CM | POA: Diagnosis not present

## 2017-04-21 ENCOUNTER — Telehealth: Payer: Self-pay | Admitting: Pain Medicine

## 2017-04-21 NOTE — Telephone Encounter (Signed)
Patient called to ask about having a procedure done, either facets or RF, he is having increased pain and muscle spasms and feels he needs a procedure. Should we bring in for eval with Crystal or wait until Dr. Dossie Arbour is back and schedule with him for eval.

## 2017-04-22 NOTE — Telephone Encounter (Signed)
Spoke with patient and he states he is having left sided pain.  Denies leg pain.  There is a prn order for a Lumbar facet block.  Can we get approval for a left sided facet injection.  Please call patient for an appointment when approved.

## 2017-04-23 NOTE — Telephone Encounter (Signed)
I have submitted a request to [facility]. I will call the patient once I receive authorization.

## 2017-05-04 ENCOUNTER — Encounter: Payer: Self-pay | Admitting: Pain Medicine

## 2017-05-04 ENCOUNTER — Ambulatory Visit: Payer: 59 | Attending: Pain Medicine | Admitting: Pain Medicine

## 2017-05-04 VITALS — BP 130/77 | HR 87 | Temp 98.6°F | Resp 16 | Ht 74.0 in | Wt 275.0 lb

## 2017-05-04 DIAGNOSIS — M47896 Other spondylosis, lumbar region: Secondary | ICD-10-CM

## 2017-05-04 DIAGNOSIS — M791 Myalgia: Secondary | ICD-10-CM | POA: Diagnosis not present

## 2017-05-04 DIAGNOSIS — R52 Pain, unspecified: Secondary | ICD-10-CM | POA: Diagnosis not present

## 2017-05-04 DIAGNOSIS — M5441 Lumbago with sciatica, right side: Secondary | ICD-10-CM | POA: Diagnosis not present

## 2017-05-04 DIAGNOSIS — M7918 Myalgia, other site: Secondary | ICD-10-CM | POA: Insufficient documentation

## 2017-05-04 DIAGNOSIS — G8929 Other chronic pain: Secondary | ICD-10-CM | POA: Insufficient documentation

## 2017-05-04 DIAGNOSIS — M533 Sacrococcygeal disorders, not elsewhere classified: Secondary | ICD-10-CM | POA: Diagnosis not present

## 2017-05-04 DIAGNOSIS — M47816 Spondylosis without myelopathy or radiculopathy, lumbar region: Secondary | ICD-10-CM

## 2017-05-04 HISTORY — DX: Myalgia, other site: M79.18

## 2017-05-04 MED ORDER — LIDOCAINE HCL (PF) 1 % IJ SOLN
5.0000 mL | Freq: Once | INTRAMUSCULAR | Status: AC
  Administered 2017-05-04: 5 mL

## 2017-05-04 MED ORDER — ROPIVACAINE HCL 2 MG/ML IJ SOLN
9.0000 mL | Freq: Once | INTRAMUSCULAR | Status: AC
  Administered 2017-05-04: 10 mL

## 2017-05-04 MED ORDER — ROPIVACAINE HCL 2 MG/ML IJ SOLN
INTRAMUSCULAR | Status: AC
  Filled 2017-05-04: qty 10

## 2017-05-04 MED ORDER — TRIAMCINOLONE ACETONIDE 40 MG/ML IJ SUSP
40.0000 mg | Freq: Once | INTRAMUSCULAR | Status: AC
  Administered 2017-05-04: 40 mg

## 2017-05-04 MED ORDER — TRIAMCINOLONE ACETONIDE 40 MG/ML IJ SUSP
INTRAMUSCULAR | Status: AC
  Filled 2017-05-04: qty 1

## 2017-05-04 MED ORDER — LIDOCAINE HCL (PF) 1 % IJ SOLN
INTRAMUSCULAR | Status: AC
  Filled 2017-05-04: qty 5

## 2017-05-04 NOTE — Patient Instructions (Signed)

## 2017-05-04 NOTE — Progress Notes (Signed)
Patient's Name: Dean Ford  MRN: [id number]  Referring Provider: Roselee Nova, MD  DOB: 1976-03-26  PCP: Roselee Nova, MD  DOS: 05/04/2017  Note by: Gaspar Cola, MD  Service setting: Ambulatory outpatient  Specialty: Interventional Pain Management  Patient type: Established  Location: [facility] (AMB) Pain Management Facility  Visit type: Interventional Procedure   Primary Reason for Visit: Interventional Pain Management Treatment. CC: Back Pain (lower)  Procedure:  Anesthesia, Analgesia, Anxiolysis:  Type: Trigger Point Injection (1-2 muscle groups) (Lumbar paravertebral muscles) CPT: 20552 Region:Posterior Lumbar Level: Lumbar Laterality: Bilateral Paravertebral  Type: Local Anesthesia Local Anesthetic: Lidocaine 1% Route: Infiltration ([place]/IM) IV Access: Declined Sedation: Declined  Indication(s): Analgesia          Indications: 1. Acute myofascial pain   2. Chronic low back pain (Location of Primary Source of Pain) (R>L)   3. Lumbar facet hypertrophy   4. Chronic sacroiliac joint pain (Bilateral) (R>L)    Pain Score: Pre-procedure: 4 /10 Post-procedure: 2 /10  Pre-op Assessment:  Mr. Springborn is a 41 y.o. (year old), male patient, seen today for interventional treatment. He  has a past surgical history that includes Elbow surgery. Mr. Rogus has a current medication list which includes the following prescription(s): atorvastatin, carvedilol, fludrocortisone, hydrocortisone, levothyroxine, sildenafil, testosterone cypionate, tramadol, and valsartan. His primarily concern today is the Back Pain (lower)  The patient comes into the clinic today with a flareup of his low back pain. Physical examination demonstrated 2 particular trigger points around the PSIS, bilaterally. In view of this, the decision was made to proceed with a trigger point in the paravertebral muscles at the affected level, bilaterally. Should the patient not benefit from this, we will consider facet block,  since he does have a history of lumbar facet syndrome that responds well to lumbar facet injections, as well as diagnostic bilateral sacroiliac joint injections since today's exam was significant for pain coming from the SI joints, bilaterally, upon doing the Patrick's maneuver.  Initial Vital Signs: Blood pressure 118/73, pulse 80, temperature 98.6 F (37 C), temperature source Oral, resp. rate 16, height 6\' 2"  (1.88 m), weight 275 lb (124.7 kg), SpO2 98 %. BMI: Estimated body mass index is 35.31 kg/m as calculated from the following:   Height as of this encounter: 6\' 2"  (1.88 m).   Weight as of this encounter: 275 lb (124.7 kg).  Risk Assessment: Allergies: Reviewed. He is allergic to borax.  Allergy Precautions: None required Coagulopathies: Reviewed. None identified.  Blood-thinner therapy: None at this time Active Infection(s): Reviewed. None identified. Mr. Cambria is afebrile  Site Confirmation: Mr. Uselman was asked to confirm the procedure and laterality before marking the site Procedure checklist: Completed Consent: Before the procedure and under the influence of no sedative(s), amnesic(s), or anxiolytics, the patient was informed of the treatment options, risks and possible complications. To fulfill our ethical and legal obligations, as recommended by the American Medical Association's Code of Ethics, I have informed the patient of my clinical impression; the nature and purpose of the treatment or procedure; the risks, benefits, and possible complications of the intervention; the alternatives, including doing nothing; the risk(s) and benefit(s) of the alternative treatment(s) or procedure(s); and the risk(s) and benefit(s) of doing nothing. The patient was provided information about the general risks and possible complications associated with the procedure. These may include, but are not limited to: failure to achieve desired goals, infection, bleeding, organ or nerve damage, allergic  reactions, paralysis, and death.  In addition, the patient was informed of those risks and complications associated to the procedure, such as failure to decrease pain; infection; bleeding; organ or nerve damage with subsequent damage to sensory, motor, and/or autonomic systems, resulting in permanent pain, numbness, and/or weakness of one or several areas of the body; allergic reactions; (i.e.: anaphylactic reaction); and/or death. Furthermore, the patient was informed of those risks and complications associated with the medications. These include, but are not limited to: allergic reactions (i.e.: anaphylactic or anaphylactoid reaction(s)); adrenal axis suppression; blood sugar elevation that in diabetics may result in ketoacidosis or comma; water retention that in patients with history of congestive heart failure may result in shortness of breath, pulmonary edema, and decompensation with resultant heart failure; weight gain; swelling or edema; medication-induced neural toxicity; particulate matter embolism and blood vessel occlusion with resultant organ, and/or nervous system infarction; and/or aseptic necrosis of one or more joints. Finally, the patient was informed that Medicine is not an exact science; therefore, there is also the possibility of unforeseen or unpredictable risks and/or possible complications that may result in a catastrophic outcome. The patient indicated having understood very clearly. We have given the patient no guarantees and we have made no promises. Enough time was given to the patient to ask questions, all of which were answered to the patient's satisfaction. Mr. Mabee has indicated that he wanted to continue with the procedure. Attestation: I, the ordering provider, attest that I have discussed with the patient the benefits, risks, side-effects, alternatives, likelihood of achieving goals, and potential problems during recovery for the procedure that I have provided informed  consent. Date: 05/04/2017; Time: 8:19 AM  Pre-Procedure Preparation:  Monitoring: As per clinic protocol. Respiration, ETCO2, SpO2, BP, heart rate and rhythm monitor placed and checked for adequate function Safety Precautions: Patient was assessed for positional comfort and pressure points before starting the procedure. Time-out: I initiated and conducted the "Time-out" before starting the procedure, as per protocol. The patient was asked to participate by confirming the accuracy of the "Time Out" information. Verification of the correct person, site, and procedure were performed and confirmed by me, the nursing staff, and the patient. "Time-out" conducted as per Joint Commission's Universal Protocol ([id number].01.01.01). "Time-out" Date & Time: 05/04/2017; 0828 hrs.  Description of Procedure Process:   Position: Sitting Target Area: Trigger Point Approach: Ipsilateral approach. Area Prepped: Entire Posterior Lumbosacral Region Prepping solution: ChloraPrep (2% chlorhexidine gluconate and 70% isopropyl alcohol) Safety Precautions: Aspiration looking for blood return was conducted prior to all injections. At no point did we inject any substances, as a needle was being advanced. No attempts were made at seeking any paresthesias. Safe injection practices and needle disposal techniques used. Medications properly checked for expiration dates. SDV (single dose vial) medications used. Description of the Procedure: Protocol guidelines were followed. The patient was placed in position over the fluoroscopy table. The target area was identified and the area prepped in the usual manner. Skin desensitized using vapocoolant spray. Skin & deeper tissues infiltrated with local anesthetic. Appropriate amount of time allowed to pass for local anesthetics to take effect. The procedure needles were then advanced to the target area. Proper needle placement secured. Negative aspiration confirmed. Solution injected in intermittent  fashion, asking for systemic symptoms every 0.5cc of injectate. The needles were then removed and the area cleansed, making sure to leave some of the prepping solution back to take advantage of its long term bactericidal properties. Vitals:   05/04/17 0806 05/04/17 0833  BP: 118/73 130/77  Pulse: 80 87  Resp: 16 16  Temp: 98.6 F (37 C)   TempSrc: Oral   SpO2: 98% 97%  Weight: 275 lb (124.7 kg)   Height: 6\' 2"  (1.88 m)     Start Time: 0828 hrs. End Time: 0829 hrs. Materials:  Needle(s) Type: Epidural needle Gauge: 25G Length: 1.5-in Medication(s): We administered lidocaine (PF), ropivacaine (PF) 2 mg/mL (0.2%), and triamcinolone acetonide. Please see chart orders for dosing details.  Imaging Guidance:  Type of Imaging Technique: None used Indication(s): N/A Exposure Time: No patient exposure Contrast: None used. Fluoroscopic Guidance: N/A Ultrasound Guidance: N/A Interpretation: N/A  Antibiotic Prophylaxis:  Indication(s): None identified Antibiotic given: None  Post-operative Assessment:  EBL: None Complications: No immediate post-treatment complications observed by team, or reported by patient. Note: The patient tolerated the entire procedure well. A repeat set of vitals were taken after the procedure and the patient was kept under observation following institutional policy, for this type of procedure. Post-procedural neurological assessment was performed, showing return to baseline, prior to discharge. The patient was provided with post-procedure discharge instructions, including a section on how to identify potential problems. Should any problems arise concerning this procedure, the patient was given instructions to immediately contact us, at any time, without hesitation. In any case, we plan to contact the patient by telephone for a follow-up status report regarding this interventional procedure. Comments:  No additional relevant information.  Plan of Care  Disposition:  Discharge home  Discharge Date & Time: 05/04/2017; 0835 hrs.   Physician-requested Follow-up:  Return for post-procedure eval by Crystal, in 2 weeks.  Future Appointments Date Time Provider [place]  05/17/2017 8:00 AM Vevelyn Francois, NP [facility] None   Possible POC:  Should today's injection does not provide the patient with complete relief of his pain, we will consider bringing the patient back for a palliative/diagnostic bilateral lumbar facet block + SI joint injection under fluoroscopic guidance and IV sedation. During today's appointment, the patient had a positive bilateral SI joint pain on Patrick's maneuver.    Imaging Orders  No imaging studies ordered today    Procedure Orders     TRIGGER POINT INJECTION     LUMBAR FACET(MEDIAL BRANCH NERVE BLOCK) MBNB     SACROILIAC JOINT INJECTINS  Medications ordered for procedure: Meds ordered this encounter  Medications  . lidocaine (PF) (XYLOCAINE) 1 % injection 5 mL  . ropivacaine (PF) 2 mg/mL (0.2%) (NAROPIN) injection 9 mL  . triamcinolone acetonide (KENALOG-40) injection 40 mg   Medications administered: We administered lidocaine (PF), ropivacaine (PF) 2 mg/mL (0.2%), and triamcinolone acetonide.  See the medical record for exact dosing, route, and time of administration.  New Prescriptions   No medications on file   Primary Care Physician: Roselee Nova, MD Location: [facility] Note by: Gaspar Cola, MD Date: 05/04/2017; Time: 9:19 AM  Disclaimer:  Medicine is not an Chief Strategy Officer. The only guarantee in medicine is that nothing is guaranteed. It is important to note that the decision to proceed with this intervention was based on the information collected from the patient. The Data and conclusions were drawn from the patient's questionnaire, the interview, and the physical examination. Because the information was provided in large part by the patient, it cannot be  guaranteed that it has not been purposely or unconsciously manipulated. Every effort has been made to obtain as much relevant data as possible for this evaluation. It is important to note that the conclusions  that lead to this procedure are derived in large part from the available data. Always take into account that the treatment will also be dependent on availability of resources and existing treatment guidelines, considered by other Pain Management Practitioners as being common knowledge and practice, at the time of the intervention. For Medico-Legal purposes, it is also important to point out that variation in procedural techniques and pharmacological choices are the acceptable norm. The indications, contraindications, technique, and results of the above procedure should only be interpreted and judged by a Board-Certified Interventional Pain Specialist with extensive familiarity and expertise in the same exact procedure and technique.

## 2017-05-04 NOTE — Progress Notes (Signed)
Safety precautions to be maintained throughout the outpatient stay will include: orient to surroundings, keep bed in low position, maintain call bell within reach at all times, provide assistance with transfer out of bed and ambulation.  

## 2017-05-05 ENCOUNTER — Telehealth: Payer: Self-pay | Admitting: *Deleted

## 2017-05-05 NOTE — Telephone Encounter (Signed)
Voicemail left with patient to call our office re; procedure on yesterday if they have questions or concerns.

## 2017-05-13 ENCOUNTER — Emergency Department
Admission: EM | Admit: 2017-05-13 | Discharge: 2017-05-13 | Disposition: A | Discharge status: Home / Self Care | Payer: 59 | Attending: Emergency Medicine | Admitting: Emergency Medicine

## 2017-05-13 ENCOUNTER — Encounter: Payer: Self-pay | Admitting: Emergency Medicine

## 2017-05-13 DIAGNOSIS — Z87891 Personal history of nicotine dependence: Secondary | ICD-10-CM | POA: Diagnosis not present

## 2017-05-13 DIAGNOSIS — Z23 Encounter for immunization: Secondary | ICD-10-CM | POA: Diagnosis not present

## 2017-05-13 DIAGNOSIS — G8929 Other chronic pain: Secondary | ICD-10-CM | POA: Diagnosis not present

## 2017-05-13 DIAGNOSIS — S81012A Laceration without foreign body, left knee, initial encounter: Secondary | ICD-10-CM | POA: Diagnosis not present

## 2017-05-13 DIAGNOSIS — I10 Essential (primary) hypertension: Secondary | ICD-10-CM | POA: Diagnosis not present

## 2017-05-13 DIAGNOSIS — Y999 Unspecified external cause status: Secondary | ICD-10-CM | POA: Diagnosis not present

## 2017-05-13 DIAGNOSIS — Y929 Unspecified place or not applicable: Secondary | ICD-10-CM | POA: Insufficient documentation

## 2017-05-13 DIAGNOSIS — Z79899 Other long term (current) drug therapy: Secondary | ICD-10-CM | POA: Insufficient documentation

## 2017-05-13 DIAGNOSIS — W293XXA Contact with powered garden and outdoor hand tools and machinery, initial encounter: Secondary | ICD-10-CM | POA: Diagnosis not present

## 2017-05-13 DIAGNOSIS — E039 Hypothyroidism, unspecified: Secondary | ICD-10-CM | POA: Diagnosis not present

## 2017-05-13 DIAGNOSIS — S8992XA Unspecified injury of left lower leg, initial encounter: Secondary | ICD-10-CM | POA: Diagnosis present

## 2017-05-13 DIAGNOSIS — Y939 Activity, unspecified: Secondary | ICD-10-CM | POA: Diagnosis not present

## 2017-05-13 MED ORDER — LIDOCAINE HCL (PF) 1 % IJ SOLN
INTRAMUSCULAR | Status: AC
  Administered 2017-05-13: 5 mL
  Filled 2017-05-13: qty 5

## 2017-05-13 MED ORDER — LIDOCAINE HCL 1 % IJ SOLN
5.0000 mL | Freq: Once | INTRAMUSCULAR | Status: AC
  Administered 2017-05-13: 5 mL

## 2017-05-13 MED ORDER — HYDROCODONE-ACETAMINOPHEN 5-325 MG PO TABS: 1.0000 | ORAL_TABLET | Freq: Four times a day (QID) | ORAL | 0 refills | Status: AC | PRN

## 2017-05-13 MED ORDER — CEPHALEXIN 500 MG PO CAPS: 500.0000 mg | ORAL_CAPSULE | Freq: Three times a day (TID) | ORAL | 0 refills | Status: AC

## 2017-05-13 MED ORDER — TETANUS-DIPHTH-ACELL PERTUSSIS 5-2.5-18.5 LF-MCG/0.5 IM SUSP
0.5000 mL | Freq: Once | INTRAMUSCULAR | Status: AC
  Administered 2017-05-13: 0.5 mL via INTRAMUSCULAR
  Filled 2017-05-13: qty 0.5

## 2017-05-13 MED ORDER — CEPHALEXIN 500 MG PO CAPS
ORAL_CAPSULE | ORAL | Status: AC
  Filled 2017-05-13: qty 1

## 2017-05-13 MED ORDER — CEPHALEXIN 500 MG PO CAPS
500.0000 mg | ORAL_CAPSULE | Freq: Once | ORAL | Status: AC
  Administered 2017-05-13: 500 mg via ORAL

## 2017-05-13 NOTE — ED Provider Notes (Signed)
[facility] Emergency Department Provider Note  ____________________________________________  Time seen: Approximately 6:41 PM  I have reviewed the triage vital signs and the nursing notes.   HISTORY  Chief Complaint Extremity Laceration    HPI Dean Ford is a 41 y.o. male patient presents to the emergency department with a 3 cm left knee laceration sustained with a chainsaw. Patient cannot recall his last tetanus shot. Denies weakness, radiculopathy or changes in sensation of the lower extremities. Patient has been ambulating without difficulty. No alleviating measures have been attempted.    Past Medical History:  Diagnosis Date  . Adrenal insufficiency ([place])   . Anxiety   . Arthritis   . Back pain, chronic 06/03/2015  . Hypertension   . Hypothyroid   . Sleep apnea   . Thyroid disease     Patient Active Problem List   Diagnosis Date Noted  . Acute myofascial pain 05/04/2017  . 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 Acute lumbar radiculopathy (Right) (S1) 05/26/2016  . Spasm of paraspinal muscle 01/22/2016  . Lower extremity numbness (Bilateral) (R>L) 01/22/2016  . Chronic lumbar radicular pain (right S1 and bilateral L5) (Location of Secondary source of pain) (Bilateral) (R>L) 01/22/2016  . Chronic hip pain (Right) 01/22/2016  . Osteoarthritis of hip (Right) 01/22/2016  . Chronic low back pain (Location of Primary Source of Pain) (R>L) 12/18/2015  . Erectile dysfunction 12/18/2015  . Chronic pain 09/02/2015  . Long term prescription opiate use 09/02/2015  . Encounter for chronic pain management 09/02/2015  . Chronic sacroiliac joint pain (Bilateral) (R>L) 06/11/2015  . Lumbar facet hypertrophy 06/11/2015  . Lumbosacral radiculopathy at S1 (right-sided) 06/11/2015  . Encounter for therapeutic drug level monitoring 06/03/2015  . Long term current use of opiate analgesic 06/03/2015  .  Uncomplicated opioid dependence ([place]) 06/03/2015  . Opiate use 06/03/2015  . Lumbar spondylosis 06/03/2015  . Lumbar facet syndrome (Bilateral) (R>L) 06/03/2015  . Insomnia, persistent 06/03/2015  . Intermittent muscle cramps 06/03/2015  . Always thirsty 06/03/2015  . Avitaminosis D 06/03/2015  . Lumbar spine pain 06/03/2015  . Hip arthrosis 06/03/2015  . Obesity 06/03/2015  . DDD (degenerative disc disease), lumbar 06/03/2015  . L4-L5 disc bulge 06/03/2015  . Ligamentum flavum hypertrophy ([place]) 06/03/2015  . Bulging lumbar disc 06/03/2015  . Sleep apnea 06/03/2015  . Dyslipidemia 03/01/2015  . Anxiety disorder 01/28/2015  . Dehydration symptoms 01/28/2015  . Essential hypertension 07/20/2014  . Chest discomfort 07/20/2014  . Hypothyroidism 07/20/2014  . Adrenal failure ([place]) 07/20/2014    Past Surgical History:  Procedure Laterality Date  . BACK SURGERY    . ELBOW SURGERY     right elbow    Prior to Admission medications   Medication Sig Start Date End Date Taking? Authorizing Provider  atorvastatin (LIPITOR) 40 MG tablet Take 1 tablet (40 mg total) by mouth daily. 12/09/15   Nahser, Wonda Cheng, MD  carvedilol (COREG) 6.25 MG tablet Take 1 tablet (6.25 mg total) by mouth 2 (two) times daily. 06/02/16   Nahser, Wonda Cheng, MD  cephALEXin (KEFLEX) 500 MG capsule Take 1 capsule (500 mg total) by mouth 3 (three) times daily. 05/13/17 05/23/17  Lannie Fields, PA-C  fludrocortisone (FLORINEF) 0.1 MG tablet Take 0.1 mg by mouth daily as needed for dizziness.    [provider]  HYDROcodone-acetaminophen (NORCO/VICODIN) 5-325 MG tablet Take 1 tablet by mouth every 6 (six) hours as needed for moderate pain. 05/13/17 05/15/17  Vallarie Mare M, PA-C  hydrocortisone (CORTEF) 10 MG tablet Take 10 mg by mouth daily.     [provider]  levothyroxine (SYNTHROID, LEVOTHROID) 75 MCG tablet Take 75 mcg by mouth daily before breakfast.     [provider]  sildenafil  (VIAGRA) 50 MG tablet Take 50 mg by mouth daily as needed for erectile dysfunction.    [provider]  testosterone cypionate (DEPOTESTOTERONE CYPIONATE) 200 MG/ML injection Inject 100 mg into the muscle every 14 (fourteen) days.     [provider]  traMADol (ULTRAM) 50 MG tablet Take 1-2 tablets (50-100 mg total) by mouth every 6 (six) hours as needed for severe pain. 11/19/16 05/18/17  Milinda Pointer, MD  valsartan (DIOVAN) 320 MG tablet Take 1 tablet (320 mg total) by mouth daily. 05/12/16   Nahser, Wonda Cheng, MD    Allergies Borax  Family History  Problem Relation Age of Onset  . Heart attack Father 44  . Hypertension Father     Social History Social History  Substance Use Topics  . Smoking status: Former Research scientist (life sciences)  . Smokeless tobacco: Former Systems developer  . Alcohol use No     Comment: rarely     Review of Systems  Constitutional: No fever/chills Eyes: No visual changes. No discharge ENT: No upper respiratory complaints. Cardiovascular: no chest pain. Respiratory: no cough. No SOB. Gastrointestinal: No abdominal pain.  No nausea, no vomiting.  No diarrhea.  No constipation. Musculoskeletal: Negative for musculoskeletal pain. Skin: Patient has 3 cm left knee laceration.  Neurological: Negative for headaches, focal weakness or numbness.   ____________________________________________   PHYSICAL EXAM:  VITAL SIGNS: ED Triage Vitals  Enc Vitals Group     BP 05/13/17 1736 140/82     Pulse Rate 05/13/17 1736 87     Resp 05/13/17 1736 18     Temp 05/13/17 1736 99.6 F (37.6 C)     Temp Source 05/13/17 1736 Oral     SpO2 05/13/17 1736 97 %     Weight 05/13/17 1737 280 lb (127 kg)     Height 05/13/17 1737 6\' 2"  (1.88 m)     Head Circumference --      Peak Flow --      Pain Score 05/13/17 1736 4     Pain Loc --      Pain Edu? --      Excl. in [place]? --      Constitutional: Alert and oriented. Well appearing and in no acute distress. Eyes: Conjunctivae  are normal. PERRL. EOMI. Head: Atraumatic. Cardiovascular: Normal rate, regular rhythm. Normal S1 and S2.  Good peripheral circulation. Respiratory: Normal respiratory effort without tachypnea or retractions. Lungs CTAB. Good air entry to the bases with no decreased or absent breath sounds. Musculoskeletal: Full range of motion to all extremities. No gross deformities appreciated. Neurologic:  Normal speech and language. No gross focal neurologic deficits are appreciated.  Skin: Patient has 3 cm left knee laceration, superficial to the dermis. No muscle or tendon involvement.  Psychiatric: Mood and affect are normal. Speech and behavior are normal. Patient exhibits appropriate insight and judgement.   ____________________________________________   LABS (all labs ordered are listed, but only abnormal results are displayed)  Labs Reviewed - No data to display ____________________________________________  EKG   ____________________________________________  RADIOLOGY  No results found.  ____________________________________________    PROCEDURES  Procedure(s) performed:    Procedures  LACERATION REPAIR Performed by: Lannie Fields Authorized by: Lannie Fields Consent:  Verbal consent obtained. Risks and benefits: risks, benefits and alternatives were discussed Consent given by: patient Patient identity confirmed: provided demographic data Prepped and Draped in normal sterile fashion Wound irrigated with 500 mL of normal saline and cleansed with Betadine.  Laceration Location: Left Knee  Laceration Length: 3 cm  No Foreign Bodies seen or palpated  Anesthesia: local infiltration  Local anesthetic: lidocaine 1% without epinephrine  Anesthetic total: 5 ml  Irrigation method: syringe Amount of cleaning: standard  Skin closure: 4-0 Ethilon   Number of sutures: 7  Technique: Simple Interrupted.   Patient tolerance: Patient tolerated the procedure well with no  immediate complications.   Medications  lidocaine (XYLOCAINE) 1 % (with pres) injection 5 mL (not administered)  cephALEXin (KEFLEX) 500 MG capsule (not administered)  Tdap (BOOSTRIX) injection 0.5 mL (0.5 mLs Intramuscular Given 05/13/17 1833)  cephALEXin (KEFLEX) capsule 500 mg (500 mg Oral Given 05/13/17 1838)     ____________________________________________   INITIAL IMPRESSION / ASSESSMENT AND PLAN / ED COURSE  Pertinent labs & imaging results that were available during my care of the patient were reviewed by me and considered in my medical decision making (see chart for details).  Review of the [place] CSRS was performed in accordance of the [place] prior to dispensing any controlled drugs.     Assessment and plan Left knee laceration Patient presents to the emergency department with a 3 cm left knee laceration superficial to the dermis. No muscle or tendon involvement. X-ray examination was not warranted given superficial nature of laceration. Patient underwent laceration repair without complication. Patient was advised to have sutures removed by primary care in 10 days. He was discharged with Keflex. Vital signs are reassuring prior to discharge. All patient questions were answered.   ____________________________________________  FINAL CLINICAL IMPRESSION(S) / ED DIAGNOSES  Final diagnoses:  Knee laceration, left, initial encounter      NEW MEDICATIONS STARTED DURING THIS VISIT:  New Prescriptions   CEPHALEXIN (KEFLEX) 500 MG CAPSULE    Take 1 capsule (500 mg total) by mouth 3 (three) times daily.   HYDROCODONE-ACETAMINOPHEN (NORCO/VICODIN) 5-325 MG TABLET    Take 1 tablet by mouth every 6 (six) hours as needed for moderate pain.        This chart was dictated using voice recognition software/Dragon. Despite best efforts to proofread, errors can occur which can change the meaning. Any change was purely unintentional.    Lannie Fields, PA-C 05/13/17 1857     Nena Polio, MD 05/14/17 1340

## 2017-05-13 NOTE — ED Triage Notes (Signed)
Pt via pov from home with laceration to left knee from chainsaw. Pt states he was distracted and it got away from him. Pt alert & oriented with NAD noted.

## 2017-05-17 ENCOUNTER — Ambulatory Visit: Payer: 59 | Attending: Nurse Practitioner | Admitting: Nurse Practitioner

## 2017-05-17 VITALS — BP 149/88 | HR 80 | Temp 98.1°F | Resp 16 | Ht 74.0 in | Wt 280.0 lb

## 2017-05-17 DIAGNOSIS — F419 Anxiety disorder, unspecified: Secondary | ICD-10-CM | POA: Diagnosis not present

## 2017-05-17 DIAGNOSIS — E2740 Unspecified adrenocortical insufficiency: Secondary | ICD-10-CM | POA: Diagnosis not present

## 2017-05-17 DIAGNOSIS — G4730 Sleep apnea, unspecified: Secondary | ICD-10-CM | POA: Insufficient documentation

## 2017-05-17 DIAGNOSIS — Z79891 Long term (current) use of opiate analgesic: Secondary | ICD-10-CM | POA: Diagnosis not present

## 2017-05-17 DIAGNOSIS — G894 Chronic pain syndrome: Secondary | ICD-10-CM | POA: Diagnosis not present

## 2017-05-17 DIAGNOSIS — N529 Male erectile dysfunction, unspecified: Secondary | ICD-10-CM | POA: Insufficient documentation

## 2017-05-17 DIAGNOSIS — M545 Low back pain: Secondary | ICD-10-CM | POA: Diagnosis not present

## 2017-05-17 DIAGNOSIS — M47816 Spondylosis without myelopathy or radiculopathy, lumbar region: Secondary | ICD-10-CM | POA: Diagnosis not present

## 2017-05-17 DIAGNOSIS — Z5181 Encounter for therapeutic drug level monitoring: Secondary | ICD-10-CM | POA: Diagnosis not present

## 2017-05-17 DIAGNOSIS — M4726 Other spondylosis with radiculopathy, lumbar region: Secondary | ICD-10-CM | POA: Insufficient documentation

## 2017-05-17 DIAGNOSIS — I10 Essential (primary) hypertension: Secondary | ICD-10-CM | POA: Diagnosis not present

## 2017-05-17 DIAGNOSIS — M5116 Intervertebral disc disorders with radiculopathy, lumbar region: Secondary | ICD-10-CM | POA: Diagnosis not present

## 2017-05-17 DIAGNOSIS — M5441 Lumbago with sciatica, right side: Secondary | ICD-10-CM | POA: Diagnosis not present

## 2017-05-17 DIAGNOSIS — G8929 Other chronic pain: Secondary | ICD-10-CM

## 2017-05-17 DIAGNOSIS — Z87891 Personal history of nicotine dependence: Secondary | ICD-10-CM | POA: Diagnosis not present

## 2017-05-17 MED ORDER — TRAMADOL HCL 50 MG PO TABS: 50.0000 mg | ORAL_TABLET | Freq: Four times a day (QID) | ORAL | 5 refills | Status: DC | PRN

## 2017-05-17 NOTE — Patient Instructions (Addendum)
____________________________________________________________________________________________  Medication Rules  Applies to: All patients receiving prescriptions (written or electronic).  Pharmacy of record: Pharmacy where electronic prescriptions will be sent. If written prescriptions are taken to a different pharmacy, please inform the nursing staff. The pharmacy listed in the electronic medical record should be the one where you would like electronic prescriptions to be sent.  Prescription refills: Only during scheduled appointments. Applies to both, written and electronic prescriptions.  NOTE: The following applies primarily to controlled substances (Opioid* Pain Medications).   Patient's responsibilities: 1. Pain Pills: Bring all pain pills to every appointment (except for procedure appointments). 2. Pill Bottles: Bring pills in original pharmacy bottle. Always bring newest bottle. Bring bottle, even if empty. 3. Medication refills: You are responsible for knowing and keeping track of what medications you need refilled. The day before your appointment, write a list of all prescriptions that need to be refilled. Bring that list to your appointment and give it to the admitting nurse. Prescriptions will be written only during appointments. If you forget a medication, it will not be "Called in", "Faxed", or "electronically sent". You will need to get another appointment to get these prescribed. 4. Prescription Accuracy: You are responsible for carefully inspecting your prescriptions before leaving our office. Have the discharge nurse carefully go over each prescription with you, before taking them home. Make sure that your name is accurately spelled, that your address is correct. Check the name and dose of your medication to make sure it is accurate. Check the number of pills, and the written instructions to make sure they are clear and accurate. Make sure that you are given enough medication to  last until your next medication refill appointment. 5. Taking Medication: Take medication as prescribed. Never take more pills than instructed. Never take medication more frequently than prescribed. Taking less pills or less frequently is permitted and encouraged, when it comes to controlled substances (written prescriptions).  6. Inform other Doctors: Always inform, all of your healthcare providers, of all the medications you take. 7. Pain Medication from other Providers: You are not allowed to accept any additional pain medication from any other Doctor or Healthcare provider. There are two exceptions to this rule. (see below) In the event that you require additional pain medication, you are responsible for notifying us, as stated below. 8. Medication Agreement: You are responsible for carefully reading and following our Medication Agreement. This must be signed before receiving any prescriptions from our practice. Safely store a copy of your signed Agreement. Violations to the Agreement will result in no further prescriptions. (Additional copies of our Medication Agreement are available upon request.) 9. Laws, Rules, & Regulations: All patients are expected to follow all Federal and State Laws, Statutes, Rules, & Regulations. Ignorance of the Laws does not constitute a valid excuse. The use of any illegal substances is prohibited. 10. Adopted CDC guidelines & recommendations: Target dosing levels will be at or below 60 MME/day. Use of benzodiazepines** is not recommended.  Exceptions: There are only two exceptions to the rule of not receiving pain medications from other Healthcare Providers. 1. Exception #1 (Emergencies): In the event of an emergency (i.e.: accident requiring emergency care), you are allowed to receive additional pain medication. However, you are responsible for: As soon as you are able, call our office [phone], at any time of the day or night, and leave a message stating your  name, the date and nature of the emergency, and the name and dose of the medication   prescribed. In the event that your call is answered by a member of our staff, make sure to document and save the date, time, and the name of the person that took your information.  2. Exception #2 (Planned Surgery): In the event that you are scheduled by another doctor or dentist to have any type of surgery or procedure, you are allowed (for a period no longer than 30 days), to receive additional pain medication, for the acute post-op pain. However, in this case, you are responsible for picking up a copy of our "Post-op Pain Management for Surgeons" handout, and giving it to your surgeon or dentist. This document is available at our office, and does not require an appointment to obtain it. Simply go to our office during business hours (Monday-Thursday from 8:00 AM to 4:00 PM) (Friday 8:00 AM to 12:00 Noon) or if you have a scheduled appointment with [place], prior to your surgery, and ask for it by name. In addition, you will need to provide [place] with your name, name of your surgeon, type of surgery, and date of procedure or surgery.  *Opioid medications include: morphine, codeine, oxycodone, oxymorphone, hydrocodone, hydromorphone, meperidine, tramadol, tapentadol, buprenorphine, fentanyl, methadone. **Benzodiazepine medications include: diazepam (Valium), alprazolam (Xanax), clonazepam (Klonopine), lorazepam (Ativan), clorazepate (Tranxene), chlordiazepoxide (Librium), estazolam (Prosom), oxazepam (Serax), temazepam (Restoril), triazolam (Halcion)  ____________________________________________________________________________________________  BMI Assessment: Estimated body mass index is 35.95 kg/m as calculated from the following:   Height as of this encounter: 6\' 2"  (1.88 m).   Weight as of this encounter: 280 lb (127 kg).  BMI interpretation table: BMI level Category Range association with higher incidence of chronic pain   <18 kg/m2 Underweight   18.5-24.9 kg/m2 Ideal body weight   25-29.9 kg/m2 Overweight Increased incidence by 20%  30-34.9 kg/m2 Obese (Class I) Increased incidence by 68%  35-39.9 kg/m2 Severe obesity (Class II) Increased incidence by 136%  >40 kg/m2 Extreme obesity (Class III) Increased incidence by 254%   BMI Readings from Last 4 Encounters:  05/17/17 35.95 kg/m  05/13/17 35.95 kg/m  05/04/17 35.31 kg/m  11/19/16 35.95 kg/m   Wt Readings from Last 4 Encounters:  05/17/17 280 lb (127 kg)  05/13/17 280 lb (127 kg)  05/04/17 275 lb (124.7 kg)  11/19/16 280 lb (127 kg)

## 2017-05-17 NOTE — Progress Notes (Signed)
Nursing Pain Medication Assessment:  Safety precautions to be maintained throughout the outpatient stay will include: orient to surroundings, keep bed in low position, maintain call bell within reach at all times, provide assistance with transfer out of bed and ambulation.  Medication Inspection Compliance: Pill count conducted under aseptic conditions, in front of the patient. Neither the pills nor the bottle was removed from the patient's sight at any time. Once count was completed pills were immediately returned to the patient in their original bottle.  Medication: See above Pill/Patch Count: 86 of 240 pills remain Pill/Patch Appearance: Markings consistent with prescribed medication Bottle Appearance: Standard pharmacy container. Clearly labeled. Filled Date: 09 / 07 / 2018 Last Medication intake:  Today

## 2017-05-17 NOTE — Progress Notes (Signed)
Patient's Name: Dean Ford  MRN: [id number]  Referring Provider: Roselee Nova, MD  DOB: June 27, 1976  PCP: Roselee Nova, MD  DOS: 05/17/2017  Note by: Vevelyn Francois NP  Service setting: Ambulatory outpatient  Specialty: Interventional Pain Management  Location: [facility] (AMB) Pain Management Facility    Patient type: Established    Primary Reason(s) for Visit: Encounter for prescription drug management & post-procedure evaluation of chronic illness with mild to moderate exacerbation(Level of risk: moderate) CC: Back Pain (lower)  HPI  Dean Ford is a 41 y.o. year old, male patient, who comes today for a post-procedure evaluation and medication management. 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[place]); Anxiety disorder; Dehydration symptoms; Dyslipidemia; Encounter for therapeutic drug level monitoring; Long term current use of opiate analgesic; Uncomplicated opioid dependence ([place]); Opiate use; Lumbar spondylosis; Lumbar facet syndrome (Bilateral) (R>L); Insomnia, persistent; Intermittent muscle cramps; Always thirsty; Avitaminosis D; Lumbar spine pain; Hip arthrosis; Obesity; DDD (degenerative disc disease), lumbar; L4-L5 disc bulge; Ligamentum flavum hypertrophy (HCC); Bulging lumbar disc; Sleep apnea; Chronic sacroiliac joint pain (Bilateral) (R>L); Lumbar facet hypertrophy; Lumbosacral radiculopathy at S1 (right-sided); Chronic pain; Long term prescription opiate use; Encounter for chronic pain management; Chronic low back pain (Location of Primary Source of Pain) (R>L); Erectile dysfunction; Spasm of paraspinal muscle; Lower extremity numbness (Bilateral) (R>L); Chronic lumbar radicular pain (right S1 and bilateral L5) (Location of Secondary source of pain) (Bilateral) (R>L); Chronic hip pain (Right); Osteoarthritis of hip (Right); Acute lumbar radiculopathy (Right) (S1); 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 His primarily concern today is the Back Pain (lower)  Pain Assessment: Location: Lower, Right Back Radiating: na Onset: More than a month ago Duration: Chronic pain Quality: Aching, Dull Severity: 3 /10 (self-reported pain score)  Note: Reported level is compatible with observation.                    Effect on ADL: standing for long periods of time Timing: Intermittent Modifying factors: sit in zero gravity chair  Dean Ford was last seen on Visit date not found for a procedure. During today's appointment we reviewed Dean Ford post-procedure results, as well as his outpatient medication regimen. He is taking Tramadol 2 tabs 3 times. He denies any side effects,. He is doing well S/P Trigger point injections.   Further details on both, my assessment(s), as well as the proposed treatment plan, please see below.  Controlled Substance Pharmacotherapy Assessment REMS (Risk Evaluation and Mitigation Strategy)  Analgesic:Tramadol 100 mg every 6 hours (400 mg/day of tramadol)  MME/day:40 mg/day.  Dean Specking, RN  05/17/2017  8:23 AM  Sign at close encounter Nursing Pain Medication Assessment:  Safety precautions to be maintained throughout the outpatient stay will include: orient to surroundings, keep bed in low position, maintain call bell within reach at all times, provide assistance with transfer out of bed and ambulation.  Medication Inspection Compliance: Pill count conducted under aseptic conditions, in front of the patient. Neither the pills nor the bottle was removed from the patient's sight at any time. Once count was completed pills were immediately returned to the patient in their original bottle.  Medication: See above Pill/Patch Count: 86 of 240 pills remain Pill/Patch Appearance: Markings consistent with prescribed medication Bottle Appearance: Standard pharmacy container. Clearly labeled. Filled Date: 09 / 07 / 2018 Last  Medication intake:  Today   Pharmacokinetics: Liberation and  absorption (onset of action): WNL Distribution (time to peak effect): WNL Metabolism and excretion (duration of action): WNL         Pharmacodynamics: Desired effects: Analgesia: Dean Ford reports >50% benefit. Functional ability: Patient reports that medication allows him to accomplish basic ADLs Clinically meaningful improvement in function (CMIF): Sustained CMIF goals met Perceived effectiveness: Described as relatively effective, allowing for increase in activities of daily living (ADL) Undesirable effects: Side-effects or Adverse reactions: None reported Monitoring: [place] PMP: Online review of the past 32-monthperiod conducted. Compliant with practice rules and regulations Last UDS on record: No results found for: SUMMARY UDS interpretation: Compliant          Medication Assessment Form: Reviewed. Patient indicates being compliant with therapy Treatment compliance: Compliant Risk Assessment Profile: Aberrant behavior: See prior evaluations. None observed or detected today Comorbid factors increasing risk of overdose: See prior notes. No additional risks detected today Risk of substance use disorder (SUD): Low     Opioid Risk Tool - 05/17/17 0819      Family History of Substance Abuse   Alcohol Negative   Illegal Drugs Negative   Rx Drugs Negative     Personal History of Substance Abuse   Alcohol Negative   Illegal Drugs Negative   Rx Drugs Negative     Age   Age between 179-45years  No     History of Preadolescent Sexual Abuse   History of Preadolescent Sexual Abuse Negative or Male     Psychological Disease   Psychological Disease Negative   Depression Negative     Total Score   Opioid Risk Tool Scoring 0   Opioid Risk Interpretation Low Risk     ORT Scoring interpretation table:  Score <3 = Low Risk for SUD  Score between 4-7 = Moderate Risk for SUD  Score >8 = High Risk for Opioid Abuse   Risk  Mitigation Strategies:  Patient Counseling: Covered Patient-Prescriber Agreement (PPA): Present and active  Notification to other healthcare providers: Done  Pharmacologic Plan: No change in therapy, at this time  Post-Procedure Assessment  Visit date not found Procedure: 05/04/17 Lumbar Trigger Point  Pre-procedure pain score:  4      /10 Post-procedure pain score: 2/10         Influential Factors: BMI: 35.95 kg/m Intra-procedural challenges: None observed.         Assessment challenges: None detected.              Reported side-effects: None.        Post-procedural adverse reactions or complications: None reported         Sedation: Please see nurses note. When no sedatives are used, the analgesic levels obtained are directly associated to the effectiveness of the local anesthetics. However, when sedation is provided, the level of analgesia obtained during the initial 1 hour following the intervention, is believed to be the result of a combination of factors. These factors may include, but are not limited to: 1. The effectiveness of the local anesthetics used. 2. The effects of the analgesic(s) and/or anxiolytic(s) used. 3. The degree of discomfort experienced by the patient at the time of the procedure. 4. The patients ability and reliability in recalling and recording the events. 5. The presence and influence of possible secondary gains and/or psychosocial factors. Reported result: Relief experienced during the 1st hour after the procedure: 100 % (Ultra-Short Term Relief)            Interpretative annotation: Clinically  appropriate result. Analgesia during this period is likely to be Local Anesthetic and/or IV Sedative (Analgesic/Anxiolytic) related.          Effects of local anesthetic: The analgesic effects attained during this period are directly associated to the localized infiltration of local anesthetics and therefore cary significant diagnostic value as to the etiological  location, or anatomical origin, of the pain. Expected duration of relief is directly dependent on the pharmacodynamics of the local anesthetic used. Long-acting (4-6 hours) anesthetics used.  Reported result: Relief during the next 4 to 6 hour after the procedure: 100 % (Short-Term Relief)            Interpretative annotation: Clinically appropriate result. Analgesia during this period is likely to be Local Anesthetic-related.          Long-term benefit: Defined as the period of time past the expected duration of local anesthetics (1 hour for short-acting and 4-6 hours for long-acting). With the possible exception of prolonged sympathetic blockade from the local anesthetics, benefits during this period are typically attributed to, or associated with, other factors such as analgesic sensory neuropraxia, antiinflammatory effects, or beneficial biochemical changes provided by agents other than the local anesthetics.  Reported result: Extended relief following procedure: 100 % (Long-Term Relief)            Interpretative annotation: Clinically appropriate result. Good relief. No permanent benefit expected. Inflammation plays a part in the etiology to the pain.          Current benefits: Defined as reported results that persistent at this point in time.   Analgesia: 75-100 %            Function: Mr. Doeden reports improvement in function ROM: Mr. Beckstead reports improvement in ROM Interpretative annotation: Ongoing benefit. Therapeutic benefit observed. Effective palliative intervention.          Interpretation: Results would suggest a successful diagnostic intervention.                  Plan:  Please see "Plan of Care" for details.       Laboratory Chemistry  Inflammation Markers (CRP: Acute Phase) (ESR: Chronic Phase) No results found for: CRP, ESRSEDRATE               Renal Function Markers Lab Results  Component Value Date   BUN 11 03/12/2016   CREATININE 1.00 10/15/2016   GFRAA 124 03/01/2015    GFRNONAA 107 03/01/2015                 Hepatic Function Markers Lab Results  Component Value Date   AST 26 03/12/2016   ALT 36 03/12/2016   ALBUMIN 4.3 03/12/2016   ALKPHOS 44 03/12/2016                 Electrolytes Lab Results  Component Value Date   NA 137 03/12/2016   K 4.0 03/12/2016   CL 102 03/12/2016   CALCIUM 9.3 03/12/2016   MG 2.1 12/19/2012                 Neuropathy Markers No results found for: [id number]               Bone Pathology Markers Lab Results  Component Value Date   ALKPHOS 44 03/12/2016   CALCIUM 9.3 03/12/2016                 Coagulation Parameters Lab Results  Component Value Date   PLT 238 03/01/2015  Cardiovascular Markers Lab Results  Component Value Date   HGB 15.8 03/01/2015   HCT 46.0 03/01/2015                 Note: Lab results reviewed.  Recent Diagnostic Imaging Results  MR Lumbar Spine W Wo Contrast CLINICAL DATA:  Low back pain with RIGHT leg pain down the foot. Recent lumbar discectomy.  EXAM: MRI LUMBAR SPINE WITHOUT AND WITH CONTRAST  TECHNIQUE: Multiplanar and multiecho pulse sequences of the lumbar spine were obtained without and with intravenous contrast.  CONTRAST:  54m MULTIHANCE GADOBENATE DIMEGLUMINlE 529 MG/ML IV SOLN  COMPARISON:  MRI lumbar spine 06/11/2016.  FINDINGS: Segmentation:  Standard  Alignment:  4 mm retrolisthesis L5-S1.  Vertebrae: No fracture, evidence of discitis, or bone lesion. No concerning postoperative enhancement.  Conus medullaris: Extends to the L1 level and appears normal.  Paraspinal and other soft tissues: Dorsal inflammatory change related to discectomy. No evidence for CSF leak.  Disc levels:  L1-L2:  Normal.  L2-L3:  Mild facet arthropathy.  Normal disc space.  No impingement.  L3-L4: Normal disc space. Mild to moderate facet arthropathy. No impingement.  L4-L5: Annular bulge. Foraminal and extraforaminal protrusion on the LEFT is  similar to preoperative appearance. Some foraminal narrowing without impingement.  L5-S1: Recent L5-S1 RIGHT laminotomy and discectomy. Large recurrent free fragment of disc, greater than 1 cm diameter, peridiscal enhancement, causes significant mass effect on the thecal sac and RIGHT S1 nerve root. Small seroma at the surgical site, uncomplicated. Disc material also extends into the foramen on the RIGHT, potentially affecting the L5 nerve root, see image 4 series 4.  IMPRESSION: Large recurrent disc extrusion L5-S1, free fragment. Significant mass effect on the RIGHT S1 greater than RIGHT L5 nerve roots.  Unchanged foraminal protrusion L4-5, LEFT.  These results will be called to the ordering clinician or representative by the Radiologist Assistant, and communication documented in the PACS or zVision Dashboard.  Electronically Signed   By: JStaci RighterM.D.   On: 10/15/2016 12:04  Note: Imaging results reviewed.        Meds   Current Outpatient Prescriptions:  .  carvedilol (COREG) 6.25 MG tablet, Take 1 tablet (6.25 mg total) by mouth 2 (two) times daily., Disp: 180 tablet, Rfl: 3 .  cephALEXin (KEFLEX) 500 MG capsule, Take 1 capsule (500 mg total) by mouth 3 (three) times daily., Disp: 30 capsule, Rfl: 0 .  fludrocortisone (FLORINEF) 0.1 MG tablet, Take 0.1 mg by mouth daily as needed for dizziness., Disp: , Rfl:  .  HYDROcodone-acetaminophen (NORCO/VICODIN) 5-325 MG tablet, Take 1 tablet by mouth every 6 (six) hours as needed for moderate pain., Disp: , Rfl:  .  hydrocortisone (CORTEF) 10 MG tablet, Take 10 mg by mouth daily. , Disp: , Rfl:  .  levothyroxine (SYNTHROID, LEVOTHROID) 75 MCG tablet, Take 75 mcg by mouth daily before breakfast. , Disp: , Rfl:  .  sildenafil (VIAGRA) 50 MG tablet, Take 50 mg by mouth daily as needed for erectile dysfunction., Disp: , Rfl:  .  testosterone cypionate (DEPOTESTOTERONE CYPIONATE) 200 MG/ML injection, Inject 100 mg into the muscle  every 14 (fourteen) days. , Disp: , Rfl:  .  valsartan (DIOVAN) 320 MG tablet, Take 1 tablet (320 mg total) by mouth daily., Disp: 30 tablet, Rfl: 11 .  atorvastatin (LIPITOR) 40 MG tablet, Take 1 tablet (40 mg total) by mouth daily. (Patient not taking: Reported on 05/17/2017), Disp: 30 tablet, Rfl: 11 .  traMADol (ULTRAM)  50 MG tablet, Take 1-2 tablets (50-100 mg total) by mouth every 6 (six) hours as needed for severe pain., Disp: 240 tablet, Rfl: 5  ROS  Constitutional: Denies any fever or chills Gastrointestinal: No reported hemesis, hematochezia, vomiting, or acute GI distress Musculoskeletal: Denies any acute onset joint swelling, redness, loss of ROM, or weakness Neurological: No reported episodes of acute onset apraxia, aphasia, dysarthria, agnosia, amnesia, paralysis, loss of coordination, or loss of consciousness  Allergies  Mr. Hippler is allergic to borax.  PFSH  Drug: Mr. Kerney  reports that he does not use drugs. Alcohol:  reports that he does not drink alcohol. Tobacco:  reports that he has quit smoking. He has quit using smokeless tobacco. Medical:  has a past medical history of Adrenal insufficiency ([place]); Anxiety; Arthritis; Back pain, chronic (06/03/2015); Hypertension; Hypothyroid; Sleep apnea; and Thyroid disease. Surgical: Mr. Jagoda  has a past surgical history that includes Elbow surgery and Back surgery. Family: family history includes Heart attack (age of onset: 50) in his father; Hypertension in his father.  Constitutional Exam  General appearance: Well nourished, well developed, and well hydrated. In no apparent acute distress Vitals:   05/17/17 0812  BP: (!) 149/88  Pulse: 80  Resp: 16  Temp: 98.1 F (36.7 C)  SpO2: 100%  Weight: 280 lb (127 kg)  Height: 6' 2"  (1.88 m)   BMI Assessment: Estimated body mass index is 35.95 kg/m as calculated from the following:   Height as of this encounter: 6' 2"  (1.88 m).   Weight as of this encounter: 280 lb (127  kg).  Psych/Mental status: Alert, oriented x 3 (person, place, & time)       Eyes: PERLA Respiratory: No evidence of acute respiratory distress  Cervical Spine Area Exam  Skin & Axial Inspection: No masses, redness, edema, swelling, or associated skin lesions Alignment: Symmetrical Functional ROM: Unrestricted ROM      Stability: No instability detected Muscle Tone/Strength: Functionally intact. No obvious neuro-muscular anomalies detected. Sensory (Neurological): Unimpaired Palpation: No palpable anomalies              Upper Extremity (UE) Exam    Side: Right upper extremity  Side: Left upper extremity  Skin & Extremity Inspection: Skin color, temperature, and hair growth are WNL. No peripheral edema or cyanosis. No masses, redness, swelling, asymmetry, or associated skin lesions. No contractures.  Skin & Extremity Inspection: Skin color, temperature, and hair growth are WNL. No peripheral edema or cyanosis. No masses, redness, swelling, asymmetry, or associated skin lesions. No contractures.  Functional ROM: Unrestricted ROM          Functional ROM: Unrestricted ROM          Muscle Tone/Strength: Functionally intact. No obvious neuro-muscular anomalies detected.  Muscle Tone/Strength: Functionally intact. No obvious neuro-muscular anomalies detected.  Sensory (Neurological): Unimpaired          Sensory (Neurological): Unimpaired          Palpation: No palpable anomalies              Palpation: No palpable anomalies              Specialized Test(s): Deferred         Specialized Test(s): Deferred          Thoracic Spine Area Exam  Skin & Axial Inspection: No masses, redness, or swelling Alignment: Symmetrical Functional ROM: Unrestricted ROM Stability: No instability detected Muscle Tone/Strength: Functionally intact. No obvious neuro-muscular anomalies detected. Sensory (Neurological): Unimpaired  Muscle strength & Tone: No palpable anomalies  Lumbar Spine Area Exam  Skin & Axial  Inspection: Well healed scar from previous spine surgery detected Alignment: Symmetrical Functional ROM: Unrestricted ROM      Stability: No instability detected Muscle Tone/Strength: Functionally intact. No obvious neuro-muscular anomalies detected. Sensory (Neurological): Unimpaired Palpation: Non-tender       Provocative Tests: Lumbar Hyperextension and rotation test: Negative       Lumbar Lateral bending test: evaluation deferred today       Patrick's Maneuver: evaluation deferred today                    Gait & Posture Assessment  Ambulation: Unassisted Gait: Relatively normal for age and body habitus Posture: WNL   Lower Extremity Exam    Side: Right lower extremity  Side: Left lower extremity  Skin & Extremity Inspection: Skin color, temperature, and hair growth are WNL. No peripheral edema or cyanosis. No masses, redness, swelling, asymmetry, or associated skin lesions. No contractures.  Skin & Extremity Inspection: Skin color, temperature, and hair growth are WNL. No peripheral edema or cyanosis. No masses, redness, swelling, asymmetry, or associated skin lesions. No contractures.  Functional ROM: Unrestricted ROM          Functional ROM: Unrestricted ROM          Muscle Tone/Strength: Able to Toe-walk & Heel-walk without problems  Muscle Tone/Strength: Able to Toe-walk & Heel-walk without problems  Sensory (Neurological): Unimpaired  Sensory (Neurological): Unimpaired  Palpation: No palpable anomalies  Palpation: No palpable anomalies   Assessment  Primary Diagnosis & Pertinent Problem List: The primary encounter diagnosis was Chronic low back pain (Location of Primary Source of Pain) (R>L). Diagnoses of Lumbar spondylosis, Lumbar facet syndrome (Bilateral) (R>L), Chronic pain syndrome, and Long term current use of opiate analgesic were also pertinent to this visit.  Status Diagnosis  Controlled Controlled Controlled 1. Chronic low back pain (Location of Primary Source of  Pain) (R>L)   2. Lumbar spondylosis   3. Lumbar facet syndrome (Bilateral) (R>L)   4. Chronic pain syndrome   5. Long term current use of opiate analgesic     Problems updated and reviewed during this visit: No problems updated. Plan of Care  Pharmacotherapy (Medications Ordered): Meds ordered this encounter  Medications  . traMADol (ULTRAM) 50 MG tablet    Sig: Take 1-2 tablets (50-100 mg total) by mouth every 6 (six) hours as needed for severe pain.    Dispense:  240 tablet    Refill:  5    Fill one day early if pharmacy is closed on scheduled refill date. Do not refill any sooner than every 30 days. Do not fill until: 05/17/2017 To last until:11/13/2017    Order Specific Question:   Supervising Provider    Answer:   Milinda Pointer [phone]   New Prescriptions   No medications on file   Medications administered today: Mr. Braley had no medications administered during this visit. Lab-work, procedure(s), and/or referral(s): Orders Placed This Encounter  Procedures  . ToxASSURE Select 13 (MW), Urine   Imaging and/or referral(s): None  Interventional therapies: Planned, scheduled, and/or pending:   None at this time.   Considering:   Caudal epidural steroid injection  Lumbar epidural steroid injection  Right lumbar facet block  Possible lumbar facet radiofrequency ablation.    Palliative PRN treatment(s):   Caudal epidural steroid injection Right L5-S1 lumbar epidural steroid injection  Palliative right lumbar facet block  Right lumbar  facet RFA Right-sided sacroiliac joint block  Right-sided intra-articular hip joint injection  Lumbar Trigger Point injections     Provider-requested follow-up: Return in about 6 months (around 11/15/2017) for MedMgmt.  Future Appointments Date Time Provider [place]  11/15/2017 8:30 AM Vevelyn Francois, NP [facility] None   Primary Care Physician: Roselee Nova, MD Location: [facility] Outpatient Pain Management  Facility Note by: Vevelyn Francois NP Date: 05/17/2017; Time: 1:03 PM  Pain Score Disclaimer: We use the NRS-11 scale. This is a self-reported, subjective measurement of pain severity with only modest accuracy. It is used primarily to identify changes within a particular patient. It must be understood that outpatient pain scales are significantly less accurate that those used for research, where they can be applied under ideal controlled circumstances with minimal exposure to variables. In reality, the score is likely to be a combination of pain intensity and pain affect, where pain affect describes the degree of emotional arousal or changes in action readiness caused by the sensory experience of pain. Factors such as social and work situation, setting, emotional state, anxiety levels, expectation, and prior pain experience may influence pain perception and show large inter-individual differences that may also be affected by time variables.  Patient instructions provided during this appointment: Patient Instructions    ____________________________________________________________________________________________  Medication Rules  Applies to: All patients receiving prescriptions (written or electronic).  Pharmacy of record: Pharmacy where electronic prescriptions will be sent. If written prescriptions are taken to a different pharmacy, please inform the nursing staff. The pharmacy listed in the electronic medical record should be the one where you would like electronic prescriptions to be sent.  Prescription refills: Only during scheduled appointments. Applies to both, written and electronic prescriptions.  NOTE: The following applies primarily to controlled substances (Opioid* Pain Medications).   Patient's responsibilities: 1. Pain Pills: Bring all pain pills to every appointment (except for procedure appointments). 2. Pill Bottles: Bring pills in original pharmacy bottle. Always bring newest  bottle. Bring bottle, even if empty. 3. Medication refills: You are responsible for knowing and keeping track of what medications you need refilled. The day before your appointment, write a list of all prescriptions that need to be refilled. Bring that list to your appointment and give it to the admitting nurse. Prescriptions will be written only during appointments. If you forget a medication, it will not be "Called in", "Faxed", or "electronically sent". You will need to get another appointment to get these prescribed. 4. Prescription Accuracy: You are responsible for carefully inspecting your prescriptions before leaving our office. Have the discharge nurse carefully go over each prescription with you, before taking them home. Make sure that your name is accurately spelled, that your address is correct. Check the name and dose of your medication to make sure it is accurate. Check the number of pills, and the written instructions to make sure they are clear and accurate. Make sure that you are given enough medication to last until your next medication refill appointment. 5. Taking Medication: Take medication as prescribed. Never take more pills than instructed. Never take medication more frequently than prescribed. Taking less pills or less frequently is permitted and encouraged, when it comes to controlled substances (written prescriptions).  6. Inform other Doctors: Always inform, all of your healthcare providers, of all the medications you take. 7. Pain Medication from other Providers: You are not allowed to accept any additional pain medication from any other Doctor or Healthcare provider. There are two exceptions  to this rule. (see below) In the event that you require additional pain medication, you are responsible for notifying us, as stated below. 8. Medication Agreement: You are responsible for carefully reading and following our Medication Agreement. This must be signed before receiving any  prescriptions from our practice. Safely store a copy of your signed Agreement. Violations to the Agreement will result in no further prescriptions. (Additional copies of our Medication Agreement are available upon request.) 9. Laws, Rules, & Regulations: All patients are expected to follow all Federal and [employer], [employer], Rules, [employer] [facility]. Ignorance of the Laws does not constitute a valid excuse. The use of any illegal substances is prohibited. 10. Adopted CDC guidelines & recommendations: Target dosing levels will be at or below 60 MME/day. Use of benzodiazepines** is not recommended.  Exceptions: There are only two exceptions to the rule of not receiving pain medications from other Healthcare Providers. 1. Exception #1 (Emergencies): In the event of an emergency (i.e.: accident requiring emergency care), you are allowed to receive additional pain medication. However, you are responsible for: As soon as you are able, call our office [phone], at any time of the day or night, and leave a message stating your name, the date and nature of the emergency, and the name and dose of the medication prescribed. In the event that your call is answered by a member of our staff, make sure to document and save the date, time, and the name of the person that took your information.  2. Exception #2 (Planned Surgery): In the event that you are scheduled by another doctor or dentist to have any type of surgery or procedure, you are allowed (for a period no longer than 30 days), to receive additional pain medication, for the acute post-op pain. However, in this case, you are responsible for picking up a copy of our "Post-op Pain Management for Surgeons" handout, and giving it to your surgeon or dentist. This document is available at our office, and does not require an appointment to obtain it. Simply go to our office during business hours (Monday-Thursday from 8:00 AM to 4:00 PM) (Friday 8:00 AM to 12:00 Noon) or  if you have a scheduled appointment with [place], prior to your surgery, and ask for it by name. In addition, you will need to provide [place] with your name, name of your surgeon, type of surgery, and date of procedure or surgery.  *Opioid medications include: morphine, codeine, oxycodone, oxymorphone, hydrocodone, hydromorphone, meperidine, tramadol, tapentadol, buprenorphine, fentanyl, methadone. **Benzodiazepine medications include: diazepam (Valium), alprazolam (Xanax), clonazepam (Klonopine), lorazepam (Ativan), clorazepate (Tranxene), chlordiazepoxide (Librium), estazolam (Prosom), oxazepam (Serax), temazepam (Restoril), triazolam (Halcion)  ____________________________________________________________________________________________  BMI Assessment: Estimated body mass index is 35.95 kg/m as calculated from the following:   Height as of this encounter: 6' 2"  (1.88 m).   Weight as of this encounter: 280 lb (127 kg).  BMI interpretation table: BMI level Category Range association with higher incidence of chronic pain  <18 kg/m2 Underweight   18.5-24.9 kg/m2 Ideal body weight   25-29.9 kg/m2 Overweight Increased incidence by 20%  30-34.9 kg/m2 Obese (Class I) Increased incidence by 68%  35-39.9 kg/m2 Severe obesity (Class II) Increased incidence by 136%  >40 kg/m2 Extreme obesity (Class III) Increased incidence by 254%   BMI Readings from Last 4 Encounters:  05/17/17 35.95 kg/m  05/13/17 35.95 kg/m  05/04/17 35.31 kg/m  11/19/16 35.95 kg/m   Wt Readings from Last 4 Encounters:  05/17/17 280 lb (127 kg)  05/13/17  280 lb (127 kg)  05/04/17 275 lb (124.7 kg)  11/19/16 280 lb (127 kg)

## 2017-05-20 ENCOUNTER — Other Ambulatory Visit: Payer: Self-pay | Admitting: Cardiovascular Disease

## 2017-05-20 ENCOUNTER — Telehealth: Payer: Self-pay | Admitting: *Deleted

## 2017-05-20 NOTE — Telephone Encounter (Signed)
Patient called saying [facility] called him stating they needed documentation of CPAP usage being beneficial and that he is using it. CPAP assistant called [place] spoke to [place] who states patient's insurance needed last office notes for patient stating patient is using and benefiting from CPAP therapy. Last office notes were faxed to [facility] attn: Dream taeam to [phone]. Patient notified

## 2017-05-21 LAB — TOXASSURE SELECT 13 (MW), URINE

## 2017-07-23 ENCOUNTER — Ambulatory Visit: Payer: 59 | Admitting: Cardiovascular Disease

## 2017-07-23 ENCOUNTER — Encounter: Payer: Self-pay | Admitting: Cardiovascular Disease

## 2017-07-23 VITALS — BP 128/70 | HR 82 | Ht 74.0 in | Wt 291.8 lb

## 2017-07-23 DIAGNOSIS — E782 Mixed hyperlipidemia: Secondary | ICD-10-CM

## 2017-07-23 DIAGNOSIS — I10 Essential (primary) hypertension: Secondary | ICD-10-CM

## 2017-07-23 LAB — LIPID PANEL
CHOL/HDL RATIO: 7 ratio — AB (ref 0.0–5.0)
CHOLESTEROL TOTAL: 239 mg/dL — AB (ref 100–199)
HDL: 34 mg/dL — ABNORMAL LOW (ref >39)
TRIGLYCERIDES: 414 mg/dL — AB (ref 0–149)

## 2017-07-23 LAB — BASIC METABOLIC PANEL
BUN/Creatinine Ratio: 11 (ref 9–20)
BUN: 11 mg/dL (ref 6–24)
CALCIUM: 9.3 mg/dL (ref 8.7–10.2)
CHLORIDE: 103 mmol/L (ref 96–106)
CO2: 26 mmol/L (ref 20–29)
CREATININE: 1.04 mg/dL (ref 0.76–1.27)
GFR, EST AFRICAN AMERICAN: 102 mL/min/{1.73_m2} (ref >59)
GFR, EST NON AFRICAN AMERICAN: 88 mL/min/{1.73_m2} (ref >59)
Glucose: 108 mg/dL — ABNORMAL HIGH (ref 65–99)
Potassium: 4.6 mmol/L (ref 3.5–5.2)
Sodium: 142 mmol/L (ref 134–144)

## 2017-07-23 LAB — HEPATIC FUNCTION PANEL
ALT: 33 IU/L (ref 0–44)
AST: 23 IU/L (ref 0–40)
Albumin: 4.5 g/dL (ref 3.5–5.5)
Alkaline Phosphatase: 48 IU/L (ref 39–117)
BILIRUBIN TOTAL: 0.3 mg/dL (ref 0.0–1.2)
BILIRUBIN, DIRECT: 0.09 mg/dL (ref 0.00–0.40)
Total Protein: 6.6 g/dL (ref 6.0–8.5)

## 2017-07-23 NOTE — Progress Notes (Addendum)
Cardiology Office Note   Date:  07/23/2017   ID:  Dean Ford, DOB July 10, 1976, MRN [id number]  PCP:  Dean Nova, MD  Cardiologist:   Dean Moores, MD   Chief Complaint  Patient presents with  . Hypertension   1. Hypertension 2. Chest pain - had a negative stress echo in Jan. 2016 3. Hypothyroidism 4.  Obstructive sleep apnea 5.  Adrenal insufficiency   [name] is a 41 who is referred by Dr. Debbora Ford for evaluation of his CP. He has a hx of HTN and also has hypoadrenalism, hypothyroidism, low testosterone.  CP is just left of center. Pain seems to be related to emotional stress. Starts with dyspnea. He would typically get shortness of breath, head ache and then get chest pain. Would take another losartan and all the symptoms would improve after an hour. Associated with exertion - walking . Seems to be worse in the afternoon  He snores at night. Wakes up gasping for air frequently. Has been referred for sleep study. He did not make the appt.  Does not get any regular exercise.  Has low energy due to his endocrine issues. Has had a head MRI. pituitary gland was ok. Has had cholesterol checked in the past - was borderline years ago.  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 Was not able to complete his last training session because of shortness of breath.   September 20, 2014:  Dean Ford is a 41 y.o. male who presents for follow-up for his chest pain. He had an echocardiogram that revealed normal left ventricle systolic function. He had a stress echo ( results are not found at this time) He has not been on his BP med.   BP has been higher .    No further CP,  Not exercising at all.   December 13, 2014:  Dean Ford had some chest pain - mid sternal, radiated through his back Worsened with deep breath / deep exhale Went to the [facility]  ER  Work up was negative  Has felt fine since that time.   Has been working out vigorously without any chest   December 05, 2015 Dean Ford is seen for follow up .   Has had elevated BP . Has been painting his bathroom, gets sweaty, red faced. Has some chest tightness.  Has symptoms of sleep apnea. Snores, wakes up snoring,  Not getting any other exercise  Trying to stay away from fast foods.  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  Has gained 9 lbs in the past 6 months    Oct. 17, 2017: Dean Ford is seen back today  He was diagnosed with adrenal insufficiency approximate one year ago. He's been on Cortef and Florinef since that time. Recently he's been having problems with high blood pressures. He tries to watch his salt intake. 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  Feels dehydrated all of the time.   Drinks lots of fluids all day long and then has to urinate all night long .   Jan. 12, 2018:   Dean Ford is seen today for follow-up visit. Has gained weight - 10 lbs since Oct. 2017. Had microdisc surgery  8 weeks ago. He had a ruptured disc between L5 and S1. He's back to work. He's not necessarily exercising much.  He takes the Florinef every 2-3 days .  Has cut out lots of his salty snacks  Has a poor appetite recently .   Has OSA,  Is going to get CPAP within the month   July 24, 2017:   doing ok Restarting back in his exercise  Working as a Manufacturing systems engineer for [employer] Has an ammonia smell to his sweat .    Wt Readings from Last 3 Encounters:  05/17/17 280 lb (127 kg)  05/13/17 280 lb (127 kg)  05/04/17 275 lb (124.7 kg)      Past Medical History:  Diagnosis Date  . Adrenal insufficiency ([place])   . Anxiety   . Arthritis   . Back pain, chronic 06/03/2015  . Hypertension   . Hypothyroid   . Sleep apnea   . Thyroid disease     Past  Surgical History:  Procedure Laterality Date  . BACK SURGERY    . ELBOW SURGERY     right elbow     Current Outpatient Medications  Medication Sig Dispense Refill  . atorvastatin (LIPITOR) 40 MG tablet Take 1 tablet (40 mg total) by mouth daily. (Patient not taking: Reported on 05/17/2017) 30 tablet 11  . carvedilol (COREG) 6.25 MG tablet Take 1 tablet (6.25 mg total) by mouth 2 (two) times daily. 180 tablet 3  . fludrocortisone (FLORINEF) 0.1 MG tablet Take 0.1 mg by mouth daily as needed for dizziness.    Marland Kitchen HYDROcodone-acetaminophen (NORCO/VICODIN) 5-325 MG tablet Take 1 tablet by mouth every 6 (six) hours as needed for moderate pain.    . hydrocortisone (CORTEF) 10 MG tablet Take 10 mg by mouth daily.     Marland Kitchen levothyroxine (SYNTHROID, LEVOTHROID) 75 MCG tablet Take 75 mcg by mouth daily before breakfast.     . sildenafil (VIAGRA) 50 MG tablet Take 50 mg by mouth daily as needed for erectile dysfunction.    Marland Kitchen testosterone cypionate (DEPOTESTOTERONE CYPIONATE) 200 MG/ML injection Inject 100 mg into the muscle every 14 (fourteen) days.     . traMADol (ULTRAM) 50 MG tablet Take 1-2 tablets (50-100 mg total) by mouth every 6 (six) hours as needed for severe pain. 240 tablet 5  . valsartan (DIOVAN) 320 MG tablet TAKE 1 TABLET (320 MG TOTAL) BY MOUTH DAILY. 30 tablet 2   No current facility-administered medications for this visit.     Allergies:   Borax    Social History:  The patient  reports that he has quit smoking. He has quit using smokeless tobacco. He reports that he does not drink alcohol or use drugs.   Family History:  The patient's family history includes Heart attack (age of onset: 9) in his father; Hypertension in his father.    ROS: As noted in the current history.  All other systems are negative.   Physical Exam: Height 6\' 2"  (1.88 m), weight 291 lb 12.8 oz (132.4 kg).  GEN:  Well nourished, well developed in no acute distress HEENT: Normal NECK: No JVD; No carotid  bruits LYMPHATICS: No lymphadenopathy CARDIAC: RR, no murmurs, rubs, gallops RESPIRATORY:  Clear to auscultation without rales, wheezing or rhonchi  ABDOMEN: Soft, non-tender, non-distended MUSCULOSKELETAL:  No edema; No deformity  SKIN: Warm and dry NEUROLOGIC:  Alert and oriented x 3  EKG:  Dec. 7, 2018:   NSR at 82.  NS T wave abn.  Recent Labs: 10/15/2016: Creatinine, Ser 1.00    Lipid Panel    Component Value Date/Time   CHOL 185 03/12/2016 0755   TRIG 290 (H) 03/12/2016 0755   HDL 30 (L) 03/12/2016 0755   CHOLHDL 6.2 (H) 03/12/2016 0755   VLDL 58 (H) 03/12/2016 0755   LDLCALC 97 03/12/2016 0755      Wt Readings from Last 3 Encounters:  05/17/17 280 lb (127 kg)  05/13/17 280 lb (127 kg)  05/04/17 275 lb (124.7 kg)      Other studies Reviewed: Additional studies/ records that were reviewed today include:  Stress echo and resting echo . Review of the above records demonstrates: normal LV function    ASSESSMENT AND PLAN:  1. Hypertension -   BP is well controlled   2. Chest pain-he is not having any further episodes of chest pain.  He is been working out  3. Hypothyroidism - continue meds  4. ? Obstructive sleep apnea -  5. Adrenal insufficiency:. Continue Cortef and Florinef    Current medicines are reviewed at length with the patient today.  The patient does not have concerns regarding medicines.  The following changes have been made: No changes.  Total time spent in patient care - 50 mintues   Disposition:   FU with me in 6  months.     Signed, Dean Moores, MD  07/23/2017 8:38 AM    [facility] [address], [place], [place]  [address] Phone: [phone]; Fax: [phone]

## 2017-07-23 NOTE — Patient Instructions (Signed)

## 2017-07-28 ENCOUNTER — Telehealth: Payer: Self-pay | Admitting: Cardiovascular Disease

## 2017-07-28 DIAGNOSIS — E781 Pure hyperglyceridemia: Secondary | ICD-10-CM

## 2017-07-28 MED ORDER — FENOFIBRATE 145 MG PO TABS: 145.0000 mg | ORAL_TABLET | Freq: Every day | ORAL | 3 refills | Status: DC

## 2017-07-28 NOTE — Telephone Encounter (Signed)
Left detailed message on patient's personal voice mail that Dr. Acie Fredrickson wants him to start fenofibrate 145 mg once daily. I included the reason for the change in therapy and advised him to continue atorvastatin. I advised that he will need repeat fasting lab appointment in 3 months. I advised him to call back with questions or concerns.

## 2017-07-28 NOTE — Telephone Encounter (Signed)
New message ° ° °Patient calling for lab results. Please call °

## 2017-08-03 DIAGNOSIS — G4733 Obstructive sleep apnea (adult) (pediatric): Secondary | ICD-10-CM | POA: Diagnosis not present

## 2017-09-01 ENCOUNTER — Other Ambulatory Visit: Payer: Self-pay | Admitting: Cardiovascular Disease

## 2017-10-29 ENCOUNTER — Other Ambulatory Visit: Payer: 59

## 2017-11-02 DIAGNOSIS — G5603 Carpal tunnel syndrome, bilateral upper limbs: Secondary | ICD-10-CM | POA: Diagnosis not present

## 2017-11-03 DIAGNOSIS — G4733 Obstructive sleep apnea (adult) (pediatric): Secondary | ICD-10-CM | POA: Diagnosis not present

## 2017-11-15 ENCOUNTER — Encounter: Payer: Self-pay | Admitting: Nurse Practitioner

## 2017-11-15 ENCOUNTER — Ambulatory Visit: Payer: 59 | Attending: Nurse Practitioner | Admitting: Nurse Practitioner

## 2017-11-15 ENCOUNTER — Other Ambulatory Visit: Payer: Self-pay

## 2017-11-15 VITALS — BP 138/83 | HR 73 | Temp 97.7°F | Resp 18 | Ht 74.0 in | Wt 280.0 lb

## 2017-11-15 DIAGNOSIS — Z79899 Other long term (current) drug therapy: Secondary | ICD-10-CM | POA: Insufficient documentation

## 2017-11-15 DIAGNOSIS — Z7952 Long term (current) use of systemic steroids: Secondary | ICD-10-CM | POA: Insufficient documentation

## 2017-11-15 DIAGNOSIS — E2740 Unspecified adrenocortical insufficiency: Secondary | ICD-10-CM | POA: Insufficient documentation

## 2017-11-15 DIAGNOSIS — Z79890 Hormone replacement therapy (postmenopausal): Secondary | ICD-10-CM | POA: Diagnosis not present

## 2017-11-15 DIAGNOSIS — G8929 Other chronic pain: Secondary | ICD-10-CM | POA: Diagnosis not present

## 2017-11-15 DIAGNOSIS — E559 Vitamin D deficiency, unspecified: Secondary | ICD-10-CM | POA: Diagnosis not present

## 2017-11-15 DIAGNOSIS — Z5181 Encounter for therapeutic drug level monitoring: Secondary | ICD-10-CM | POA: Insufficient documentation

## 2017-11-15 DIAGNOSIS — M533 Sacrococcygeal disorders, not elsewhere classified: Secondary | ICD-10-CM | POA: Insufficient documentation

## 2017-11-15 DIAGNOSIS — M25551 Pain in right hip: Secondary | ICD-10-CM | POA: Diagnosis not present

## 2017-11-15 DIAGNOSIS — I10 Essential (primary) hypertension: Secondary | ICD-10-CM | POA: Diagnosis not present

## 2017-11-15 DIAGNOSIS — G4700 Insomnia, unspecified: Secondary | ICD-10-CM | POA: Insufficient documentation

## 2017-11-15 DIAGNOSIS — Z8249 Family history of ischemic heart disease and other diseases of the circulatory system: Secondary | ICD-10-CM | POA: Insufficient documentation

## 2017-11-15 DIAGNOSIS — Z6835 Body mass index (BMI) 35.0-35.9, adult: Secondary | ICD-10-CM | POA: Insufficient documentation

## 2017-11-15 DIAGNOSIS — M7918 Myalgia, other site: Secondary | ICD-10-CM | POA: Diagnosis not present

## 2017-11-15 DIAGNOSIS — Z79891 Long term (current) use of opiate analgesic: Secondary | ICD-10-CM | POA: Insufficient documentation

## 2017-11-15 DIAGNOSIS — N529 Male erectile dysfunction, unspecified: Secondary | ICD-10-CM | POA: Insufficient documentation

## 2017-11-15 DIAGNOSIS — M47816 Spondylosis without myelopathy or radiculopathy, lumbar region: Secondary | ICD-10-CM

## 2017-11-15 DIAGNOSIS — E669 Obesity, unspecified: Secondary | ICD-10-CM | POA: Diagnosis not present

## 2017-11-15 DIAGNOSIS — M1611 Unilateral primary osteoarthritis, right hip: Secondary | ICD-10-CM | POA: Insufficient documentation

## 2017-11-15 DIAGNOSIS — M545 Low back pain: Secondary | ICD-10-CM | POA: Diagnosis present

## 2017-11-15 DIAGNOSIS — Z87891 Personal history of nicotine dependence: Secondary | ICD-10-CM | POA: Insufficient documentation

## 2017-11-15 DIAGNOSIS — G894 Chronic pain syndrome: Secondary | ICD-10-CM | POA: Insufficient documentation

## 2017-11-15 DIAGNOSIS — M4726 Other spondylosis with radiculopathy, lumbar region: Secondary | ICD-10-CM | POA: Insufficient documentation

## 2017-11-15 DIAGNOSIS — E039 Hypothyroidism, unspecified: Secondary | ICD-10-CM | POA: Diagnosis not present

## 2017-11-15 DIAGNOSIS — F419 Anxiety disorder, unspecified: Secondary | ICD-10-CM | POA: Diagnosis not present

## 2017-11-15 DIAGNOSIS — G4730 Sleep apnea, unspecified: Secondary | ICD-10-CM | POA: Insufficient documentation

## 2017-11-15 DIAGNOSIS — E785 Hyperlipidemia, unspecified: Secondary | ICD-10-CM | POA: Insufficient documentation

## 2017-11-15 MED ORDER — TRAMADOL HCL 50 MG PO TABS: 50.0000 mg | ORAL_TABLET | Freq: Four times a day (QID) | ORAL | 5 refills | Status: DC | PRN

## 2017-11-15 NOTE — Progress Notes (Signed)
Patient's Name: Dean Ford  MRN: [id number]  Referring Provider: Roselee Nova, MD  DOB: July 24, 1976  PCP: No primary care provider on file.  DOS: 11/15/2017  Note by: Vevelyn Francois NP  Service setting: Ambulatory outpatient  Specialty: Interventional Pain Management  Location: [facility] (AMB) Pain Management Facility    Patient type: Established    Primary Reason(s) for Visit: Encounter for prescription drug management. (Level of risk: moderate)  CC: Back Pain (low and left)  HPI  Mr. Reap is a 42 y.o. year old, male patient, who comes today for a medication management evaluation. 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[place]); Anxiety disorder; Dehydration symptoms; Dyslipidemia; Encounter for therapeutic drug level monitoring; Long term current use of opiate analgesic; Uncomplicated opioid dependence ([place]); Opiate use; Lumbar spondylosis; Lumbar facet syndrome (Bilateral) (R>L); Insomnia, persistent; Intermittent muscle cramps; Always thirsty; Avitaminosis D; Lumbar spine pain; Hip arthrosis; Obesity; DDD (degenerative disc disease), lumbar; L4-L5 disc bulge; Ligamentum flavum hypertrophy (HCC); Bulging lumbar disc; Sleep apnea; Chronic sacroiliac joint pain (Bilateral) (R>L); Lumbar facet hypertrophy; Lumbosacral radiculopathy at S1 (right-sided); Chronic pain; Long term prescription opiate use; Encounter for chronic pain management; Chronic low back pain (Location of Primary Source of Pain) (R>L); Erectile dysfunction; Spasm of paraspinal muscle; Lower extremity numbness (Bilateral) (R>L); Chronic lumbar radicular pain (right S1 and bilateral L5) (Location of Secondary source of pain) (Bilateral) (R>L); Chronic hip pain (Right); Osteoarthritis of hip (Right); Acute lumbar radiculopathy (Right) (S1); 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 His primarily concern today is the  Back Pain (low and left)  Pain Assessment: Location: Left Back Radiating: denies Onset: More than a month ago Duration: Chronic pain Quality: ("raw nerve") Severity: 2 /10 (self-reported pain score)  Note: Reported level is compatible with observation.                          Timing: Intermittent Modifying factors: medications, sitting upright  Mr. Pressley was last scheduled for an appointment on Visit date not found for medication management. During today's appointment we reviewed Mr. Weatherholtz chronic pain status, as well as his outpatient medication regimen. He denies any leg pain. He wants to discuss with endocrinologist for hypothyroidism and adrenal insufficieny. He states that he has seen a correlation with his pain and his other health issues. He wonders about inflammation. He admits that he was treated for CTS with another provided. He received an injection.  He is status post discectomy x 2 . He does work Psychologist, forensic. He feels like he does try to use proper body mechanics.    The patient  reports that he does not use drugs. His body mass index is 35.95 kg/m.  Further details on both, my assessment(s), as well as the proposed treatment plan, please see below.  Controlled Substance Pharmacotherapy Assessment REMS (Risk Evaluation and Mitigation Strategy)  Analgesic:Tramadol 100 mg every 6 hours (400 mg/day of tramadol)  MME/day:40 mg/day   Hart Rochester, RN  11/15/2017  8:40 AM  Sign at close encounter Nursing Pain Medication Assessment:  Safety precautions to be maintained throughout the outpatient stay will include: orient to surroundings, keep bed in low position, maintain call bell within reach at all times, provide assistance with transfer out of bed and ambulation.  Medication Inspection Compliance: Mr. Ryder did not comply with our request to bring his pills to be counted. He was  reminded that bringing the medication bottles, even when empty, is a  requirement.  Medication: None brought in. Pill/Patch Count: None available to be counted. Bottle Appearance: No container available. Did not bring bottle(s) to appointment. Filled Date: N/A Last Medication intake:  Today   Pharmacokinetics: Liberation and absorption (onset of action): WNL Distribution (time to peak effect): WNL Metabolism and excretion (duration of action): WNL         Pharmacodynamics: Desired effects: Analgesia: Mr. Bramer reports >50% benefit. Functional ability: Patient reports that medication allows him to accomplish basic ADLs Clinically meaningful improvement in function (CMIF): Sustained CMIF goals met Perceived effectiveness: Described as relatively effective, allowing for increase in activities of daily living (ADL) Undesirable effects: Side-effects or Adverse reactions: None reported Monitoring: [place] PMP: Online review of the past 96-monthperiod conducted. Compliant with practice rules and regulations Last UDS on record: Summary  Date Value Ref Range Status  05/17/2017 FINAL  Final    Comment:    ==================================================================== TOXASSURE SELECT 13 (MW) ==================================================================== Test                             Result       Flag       Units Drug Present and Declared for Prescription Verification   Hydrocodone                    329          EXPECTED   ng/mg creat   Hydromorphone                  92           EXPECTED   ng/mg creat   Dihydrocodeine                 71           EXPECTED   ng/mg creat   Norhydrocodone                 277          EXPECTED   ng/mg creat    Sources of hydrocodone include scheduled prescription    medications. Hydromorphone, dihydrocodeine and norhydrocodone are    expected metabolites of hydrocodone. Hydromorphone and    dihydrocodeine are also available as scheduled prescription    medications.   Tramadol                       >4505         EXPECTED   ng/mg creat   O-Desmethyltramadol            >4505        EXPECTED   ng/mg creat   N-Desmethyltramadol            964          EXPECTED   ng/mg creat    Source of tramadol is a prescription medication.    O-desmethyltramadol and N-desmethyltramadol are expected    metabolites of tramadol. ==================================================================== Test                      Result    Flag   Units      Ref Range   Creatinine              111              mg/dL      >=  20 ==================================================================== Declared Medications:  The flagging and interpretation on this report are based on the  following declared medications.  Unexpected results may arise from  inaccuracies in the declared medications.  **Note: The testing scope of this panel includes these medications:  Hydrocodone (Norco)  Hydrocodone (Vicodin)  Tramadol (Ultram)  **Note: The testing scope of this panel does not include following  reported medications:  Acetaminophen (Norco)  Acetaminophen (Vicodin)  Atorvastatin (Lipitor)  Carvedilol (Coreg)  Cephalexin (Keflex)  Fludrocortisone (Florinef)  Hydrocortisone (Cortef)  Levothyroxine  Sildenafil  Testosterone (Depo-Testosterone)  Valsartan (Diovan) ==================================================================== For clinical consultation, please call [phone]. ====================================================================    UDS interpretation: Compliant          Medication Assessment Form: Reviewed. Patient indicates being compliant with therapy Treatment compliance: Compliant Risk Assessment Profile: Aberrant behavior: See prior evaluations. None observed or detected today Comorbid factors increasing risk of overdose: See prior notes. No additional risks detected today Risk of substance use disorder (SUD): Low Opioid Risk Tool - 11/15/17 0840      Family History of Substance Abuse   Alcohol   Negative    Illegal Drugs  Negative    Rx Drugs  Negative      Personal History of Substance Abuse   Alcohol  Negative    Illegal Drugs  Negative    Rx Drugs  Negative      Age   Age between 22-45 years   Yes      History of Preadolescent Sexual Abuse   History of Preadolescent Sexual Abuse  Negative or Male      Psychological Disease   Psychological Disease  Negative    Depression  Negative      Total Score   Opioid Risk Tool Scoring  1    Opioid Risk Interpretation  Low Risk      ORT Scoring interpretation table:  Score <3 = Low Risk for SUD  Score between 4-7 = Moderate Risk for SUD  Score >8 = High Risk for Opioid Abuse   Risk Mitigation Strategies:  Patient Counseling: Covered Patient-Prescriber Agreement (PPA): Present and active  Notification to other healthcare providers: Done  Pharmacologic Plan: No change in therapy, at this time.             Laboratory Chemistry  Inflammation Markers (CRP: Acute Phase) (ESR: Chronic Phase) No results found for: CRP, ESRSEDRATE, LATICACIDVEN                       Rheumatology Markers No results found for: RF, ANA, Therisa Doyne, [facility]                      Renal Function Markers Lab Results  Component Value Date   BUN 11 07/23/2017   CREATININE 1.04 07/23/2017   GFRAA 102 07/23/2017   GFRNONAA 88 07/23/2017                              Hepatic Function Markers Lab Results  Component Value Date   AST 23 07/23/2017   ALT 33 07/23/2017   ALBUMIN 4.5 07/23/2017   ALKPHOS 48 07/23/2017                        Electrolytes Lab Results  Component Value Date   NA 142 07/23/2017   K 4.6 07/23/2017   CL 103 07/23/2017   CALCIUM  9.3 07/23/2017   MG 2.1 12/19/2012                        Neuropathy Markers No results found for: VITAMINB12, FOLATE, HGBA1C, HIV                      Bone Pathology Markers No results found for: VD25OH, [id number], [id number], [id number], 25OHVITD1, 25OHVITD2,  25OHVITD3, TESTOFREE, TESTOSTERONE                       Coagulation Parameters Lab Results  Component Value Date   PLT 238 03/01/2015                        Cardiovascular Markers Lab Results  Component Value Date   TROPONINI <0.03 12/04/2014   TROPONINI <0.03 12/04/2014   HGB 15.8 03/01/2015   HCT 46.0 03/01/2015                         CA Markers No results found for: CEA, CA125, LABCA2                      Note: Lab results reviewed.  Recent Diagnostic Imaging Results  MR Lumbar Spine W Wo Contrast CLINICAL DATA:  Low back pain with RIGHT leg pain down the foot. Recent lumbar discectomy.  EXAM: MRI LUMBAR SPINE WITHOUT AND WITH CONTRAST  TECHNIQUE: Multiplanar and multiecho pulse sequences of the lumbar spine were obtained without and with intravenous contrast.  CONTRAST:  64m MULTIHANCE GADOBENATE DIMEGLUMINlE 529 MG/ML IV SOLN  COMPARISON:  MRI lumbar spine 06/11/2016.  FINDINGS: Segmentation:  Standard  Alignment:  4 mm retrolisthesis L5-S1.  Vertebrae: No fracture, evidence of discitis, or bone lesion. No concerning postoperative enhancement.  Conus medullaris: Extends to the L1 level and appears normal.  Paraspinal and other soft tissues: Dorsal inflammatory change related to discectomy. No evidence for CSF leak.  Disc levels:  L1-L2:  Normal.  L2-L3:  Mild facet arthropathy.  Normal disc space.  No impingement.  L3-L4: Normal disc space. Mild to moderate facet arthropathy. No impingement.  L4-L5: Annular bulge. Foraminal and extraforaminal protrusion on the LEFT is similar to preoperative appearance. Some foraminal narrowing without impingement.  L5-S1: Recent L5-S1 RIGHT laminotomy and discectomy. Large recurrent free fragment of disc, greater than 1 cm diameter, peridiscal enhancement, causes significant mass effect on the thecal sac and RIGHT S1 nerve root. Small seroma at the surgical site, uncomplicated. Disc material also extends  into the foramen on the RIGHT, potentially affecting the L5 nerve root, see image 4 series 4.  IMPRESSION: Large recurrent disc extrusion L5-S1, free fragment. Significant mass effect on the RIGHT S1 greater than RIGHT L5 nerve roots.  Unchanged foraminal protrusion L4-5, LEFT.  These results will be called to the ordering clinician or representative by the Radiologist Assistant, and communication documented in the PACS or zVision Dashboard.  Electronically Signed   By: JStaci RighterM.D.   On: 10/15/2016 12:04  Complexity Note: Imaging results reviewed. Results shared with Mr. MRutigliano using Layman's terms.                         Meds   Current Outpatient Medications:  .  carvedilol (COREG) 6.25 MG tablet, Take 1 tablet (6.25 mg total) by mouth 2 (two) times daily., Disp: 180 tablet,  Rfl: 3 .  fludrocortisone (FLORINEF) 0.1 MG tablet, Take 0.1 mg by mouth daily as needed for dizziness., Disp: , Rfl:  .  hydrocortisone (CORTEF) 10 MG tablet, Take 10 mg by mouth daily. , Disp: , Rfl:  .  levothyroxine (SYNTHROID, LEVOTHROID) 75 MCG tablet, Take 75 mcg by mouth daily before breakfast. , Disp: , Rfl:  .  sildenafil (VIAGRA) 50 MG tablet, Take 50 mg by mouth daily as needed for erectile dysfunction., Disp: , Rfl:  .  testosterone cypionate (DEPOTESTOTERONE CYPIONATE) 200 MG/ML injection, Inject 100 mg into the muscle every 14 (fourteen) days. , Disp: , Rfl:  .  traMADol (ULTRAM) 50 MG tablet, Take 1-2 tablets (50-100 mg total) by mouth every 6 (six) hours as needed for severe pain., Disp: 240 tablet, Rfl: 5 .  valsartan (DIOVAN) 320 MG tablet, TAKE 1 TABLET (320 MG TOTAL) BY MOUTH DAILY., Disp: 90 tablet, Rfl: 3  ROS  Constitutional: Denies any fever or chills Gastrointestinal: No reported hemesis, hematochezia, vomiting, or acute GI distress Musculoskeletal: Denies any acute onset joint swelling, redness, loss of ROM, or weakness Neurological: No reported episodes of acute onset  apraxia, aphasia, dysarthria, agnosia, amnesia, paralysis, loss of coordination, or loss of consciousness  Allergies  Mr. Hyslop is allergic to borax.  PFSH  Drug: Mr. Edelen  reports that he does not use drugs. Alcohol:  reports that he does not drink alcohol. Tobacco:  reports that he has quit smoking. He has quit using smokeless tobacco. Medical:  has a past medical history of Adrenal insufficiency ([place]), Anxiety, Arthritis, Back pain, chronic (06/03/2015), Hypertension, Hypothyroid, Sleep apnea, and Thyroid disease. Surgical: Mr. Daughtridge  has a past surgical history that includes Elbow surgery and Back surgery. Family: family history includes Heart attack (age of onset: 17) in his father; Hypertension in his father.  Constitutional Exam  General appearance: alert, cooperative and moderately obese Vitals:   11/15/17 0837  BP: 138/83  Pulse: 73  Resp: 18  Temp: 97.7 F (36.5 C)  TempSrc: Oral  SpO2: 100%  Weight: 280 lb (127 kg)  Height: _0  (1.88 m)  Psych/Mental status: Alert, oriented x 3 (person, place, & time)       Eyes: PERLA Respiratory: No evidence of acute respiratory distress   Lumbar Spine Area Exam  Skin & Axial Inspection: Well healed scar from previous spine surgery detected Alignment: Symmetrical Functional ROM: Unrestricted ROM      Stability: No instability detected Muscle Tone/Strength: Functionally intact. No obvious neuro-muscular anomalies detected. Sensory (Neurological): Unimpaired Palpation: Non-tender       Provocative Tests: Lumbar Hyperextension and rotation test: Positive bilaterally for facet joint pain. Lumbar Lateral bending test: evaluation deferred today       Patrick's Maneuver: evaluation deferred today                    Gait & Posture Assessment  Ambulation: Unassisted Gait: Relatively normal for age and body habitus Posture: WNL   Lower Extremity Exam    Side: Right lower extremity  Side: Left lower extremity  Skin & Extremity  Inspection: Skin color, temperature, and hair growth are WNL. No peripheral edema or cyanosis. No masses, redness, swelling, asymmetry, or associated skin lesions. No contractures.  Skin & Extremity Inspection: Skin color, temperature, and hair growth are WNL. No peripheral edema or cyanosis. No masses, redness, swelling, asymmetry, or associated skin lesions. No contractures.  Functional ROM: Unrestricted ROM  Functional ROM: Unrestricted ROM          Muscle Tone/Strength: Functionally intact. No obvious neuro-muscular anomalies detected.  Muscle Tone/Strength: Functionally intact. No obvious neuro-muscular anomalies detected.  Sensory (Neurological): Unimpaired  Sensory (Neurological): Unimpaired  Palpation: No palpable anomalies  Palpation: No palpable anomalies   Assessment  Primary Diagnosis & Pertinent Problem List: The primary encounter diagnosis was Lumbar spondylosis. Diagnoses of Chronic sacroiliac joint pain (Bilateral) (R>L), Chronic hip pain (Right), Chronic pain syndrome, and Long term current use of opiate analgesic were also pertinent to this visit.  Status Diagnosis  Controlled Controlled Controlled 1. Lumbar spondylosis   2. Chronic sacroiliac joint pain (Bilateral) (R>L)   3. Chronic hip pain (Right)   4. Chronic pain syndrome   5. Long term current use of opiate analgesic     Problems updated and reviewed during this visit: No problems updated. Plan of Care  Pharmacotherapy (Medications Ordered): Meds ordered this encounter  Medications  . traMADol (ULTRAM) 50 MG tablet    Sig: Take 1-2 tablets (50-100 mg total) by mouth every 6 (six) hours as needed for severe pain.    Dispense:  240 tablet    Refill:  5    Fill one day early if pharmacy is closed on scheduled refill date. Do not refill any sooner than every 30 days. Do not fill until: 11/15/2017 To last until:05/14/2018    Order Specific Question:   Supervising Provider    Answer:   Milinda Pointer  [phone]   New Prescriptions   No medications on file   Medications administered today: [name] had no medications administered during this visit. Lab-work, procedure(s), and/or referral(s): Orders Placed This Encounter  Procedures  . ToxASSURE Select 13 (MW), Urine   Imaging and/or referral(s): None  Interventional therapies: Planned, scheduled, and/or pending:  None at this time. He declined labs. Instructed him on proper body mechanics and weight loss.    Considering:  Caudal epidural steroid injection  Lumbar epidural steroid injection  Rightlumbar facet block  Possible lumbar facet radiofrequency ablation.    Palliative PRN treatment(s):  Caudal epidural steroid injection Right L5-S1 lumbar epidural steroid injection  Palliative rightlumbar facet block  Right lumbar facet RFA Right-sided sacroiliac joint block  Right-sided intra-articular hip joint injection  Lumbar Trigger Point injections     Provider-requested follow-up: Return in about 6 months (around 05/03/2018) for MedMgmt with Me Dionisio David).  Future Appointments  Date Time Provider Carmine  04/25/2018  8:45 AM Vevelyn Francois, NP [facility] None   Primary Care Physician: No primary care provider on file. Location: [facility] Note by: Vevelyn Francois NP Date: 11/15/2017; Time: 9:33 AM  Pain Score Disclaimer: We use the NRS-11 scale. This is a self-reported, subjective measurement of pain severity with only modest accuracy. It is used primarily to identify changes within a particular patient. It must be understood that outpatient pain scales are significantly less accurate that those used for research, where they can be applied under ideal controlled circumstances with minimal exposure to variables. In reality, the score is likely to be a combination of pain intensity and pain affect, where pain affect describes the degree of emotional arousal or changes in  action readiness caused by the sensory experience of pain. Factors such as social and work situation, setting, emotional state, anxiety levels, expectation, and prior pain experience may influence pain perception and show large inter-individual differences that may also be affected by time variables.  Patient instructions provided during this appointment: Patient Instructions   ____________________________________________________________________________________________  Medication Rules  Applies to: All patients receiving prescriptions (written or electronic).  Pharmacy of record: Pharmacy where electronic prescriptions will be sent. If written prescriptions are taken to a different pharmacy, please inform the nursing staff. The pharmacy listed in the electronic medical record should be the one where you would like electronic prescriptions to be sent.  Prescription refills: Only during scheduled appointments. Applies to both, written and electronic prescriptions.  NOTE: The following applies primarily to controlled substances (Opioid* Pain Medications).   Patient's responsibilities: 1. Pain Pills: Bring all pain pills to every appointment (except for procedure appointments). 2. Pill Bottles: Bring pills in original pharmacy bottle. Always bring newest bottle. Bring bottle, even if empty. 3. Medication refills: You are responsible for knowing and keeping track of what medications you need refilled. The day before your appointment, write a list of all prescriptions that need to be refilled. Bring that list to your appointment and give it to the admitting nurse. Prescriptions will be written only during appointments. If you forget a medication, it will not be "Called in", "Faxed", or "electronically sent". You will need to get another appointment to get these prescribed. 4. Prescription Accuracy: You are responsible for carefully inspecting your prescriptions before leaving our office. Have the  discharge nurse carefully go over each prescription with you, before taking them home. Make sure that your name is accurately spelled, that your address is correct. Check the name and dose of your medication to make sure it is accurate. Check the number of pills, and the written instructions to make sure they are clear and accurate. Make sure that you are given enough medication to last until your next medication refill appointment. 5. Taking Medication: Take medication as prescribed. Never take more pills than instructed. Never take medication more frequently than prescribed. Taking less pills or less frequently is permitted and encouraged, when it comes to controlled substances (written prescriptions).  6. Inform other Doctors: Always inform, all of your healthcare providers, of all the medications you take. 7. Pain Medication from other Providers: You are not allowed to accept any additional pain medication from any other Doctor or Healthcare provider. There are two exceptions to this rule. (see below) In the event that you require additional pain medication, you are responsible for notifying us, as stated below. 8. Medication Agreement: You are responsible for carefully reading and following our Medication Agreement. This must be signed before receiving any prescriptions from our practice. Safely store a copy of your signed Agreement. Violations to the Agreement will result in no further prescriptions. (Additional copies of our Medication Agreement are available upon request.) 9. Laws, Rules, & Regulations: All patients are expected to follow all Federal and [employer], Rules, [employer]. Ignorance of the Laws does not constitute a valid excuse. The use of any illegal substances is prohibited. 10. Adopted CDC guidelines & recommendations: Target dosing levels will be at or below 60 MME/day. Use of benzodiazepines** is not recommended.  Exceptions: There are only two exceptions to the rule of  not receiving pain medications from other Healthcare Providers. 1. Exception #1 (Emergencies): In the event of an emergency (i.e.: accident requiring emergency care), you are allowed to receive additional pain medication. However, you are responsible for: As soon as you are able, call our office [phone], at any time of the day or night, and leave a message stating your name, the date and nature of  the emergency, and the name and dose of the medication prescribed. In the event that your call is answered by a member of our staff, make sure to document and save the date, time, and the name of the person that took your information.  2. Exception #2 (Planned Surgery): In the event that you are scheduled by another doctor or dentist to have any type of surgery or procedure, you are allowed (for a period no longer than 30 days), to receive additional pain medication, for the acute post-op pain. However, in this case, you are responsible for picking up a copy of our "Post-op Pain Management for Surgeons" handout, and giving it to your surgeon or dentist. This document is available at our office, and does not require an appointment to obtain it. Simply go to our office during business hours (Monday-Thursday from 8:00 AM to 4:00 PM) (Friday 8:00 AM to 12:00 Noon) or if you have a scheduled appointment with [place], prior to your surgery, and ask for it by name. In addition, you will need to provide [place] with your name, name of your surgeon, type of surgery, and date of procedure or surgery.  *Opioid medications include: morphine, codeine, oxycodone, oxymorphone, hydrocodone, hydromorphone, meperidine, tramadol, tapentadol, buprenorphine, fentanyl, methadone. **Benzodiazepine medications include: diazepam (Valium), alprazolam (Xanax), clonazepam (Klonopine), lorazepam (Ativan), clorazepate (Tranxene), chlordiazepoxide (Librium), estazolam (Prosom), oxazepam (Serax), temazepam (Restoril), triazolam (Halcion) (Last  updated: 10/14/2017) ____________________________________________________________________________________________    BMI Assessment: Estimated body mass index is 35.95 kg/m as calculated from the following:   Height as of this encounter: _0  (1.88 m).   Weight as of this encounter: 280 lb (127 kg).  BMI interpretation table: BMI level Category Range association with higher incidence of chronic pain  <18 kg/m2 Underweight   18.5-24.9 kg/m2 Ideal body weight   25-29.9 kg/m2 Overweight Increased incidence by 20%  30-34.9 kg/m2 Obese (Class I) Increased incidence by 68%  35-39.9 kg/m2 Severe obesity (Class II) Increased incidence by 136%  >40 kg/m2 Extreme obesity (Class III) Increased incidence by 254%   BMI Readings from Last 4 Encounters:  11/15/17 35.95 kg/m  07/23/17 37.46 kg/m  05/17/17 35.95 kg/m  05/13/17 35.95 kg/m   Wt Readings from Last 4 Encounters:  11/15/17 280 lb (127 kg)  07/23/17 291 lb 12.8 oz (132.4 kg)  05/17/17 280 lb (127 kg)  05/13/17 280 lb (127 kg)

## 2017-11-15 NOTE — Progress Notes (Signed)
Nursing Pain Medication Assessment:  Safety precautions to be maintained throughout the outpatient stay will include: orient to surroundings, keep bed in low position, maintain call bell within reach at all times, provide assistance with transfer out of bed and ambulation.  Medication Inspection Compliance: Dean Ford did not comply with our request to bring his pills to be counted. He was reminded that bringing the medication bottles, even when empty, is a requirement.  Medication: None brought in. Pill/Patch Count: None available to be counted. Bottle Appearance: No container available. Did not bring bottle(s) to appointment. Filled Date: N/A Last Medication intake:  Today 

## 2017-11-15 NOTE — Patient Instructions (Addendum)
____________________________________________________________________________________________  Medication Rules  Applies to: All patients receiving prescriptions (written or electronic).  Pharmacy of record: Pharmacy where electronic prescriptions will be sent. If written prescriptions are taken to a different pharmacy, please inform the nursing staff. The pharmacy listed in the electronic medical record should be the one where you would like electronic prescriptions to be sent.  Prescription refills: Only during scheduled appointments. Applies to both, written and electronic prescriptions.  NOTE: The following applies primarily to controlled substances (Opioid* Pain Medications).   Patient's responsibilities: 1. Pain Pills: Bring all pain pills to every appointment (except for procedure appointments). 2. Pill Bottles: Bring pills in original pharmacy bottle. Always bring newest bottle. Bring bottle, even if empty. 3. Medication refills: You are responsible for knowing and keeping track of what medications you need refilled. The day before your appointment, write a list of all prescriptions that need to be refilled. Bring that list to your appointment and give it to the admitting nurse. Prescriptions will be written only during appointments. If you forget a medication, it will not be "Called in", "Faxed", or "electronically sent". You will need to get another appointment to get these prescribed. 4. Prescription Accuracy: You are responsible for carefully inspecting your prescriptions before leaving our office. Have the discharge nurse carefully go over each prescription with you, before taking them home. Make sure that your name is accurately spelled, that your address is correct. Check the name and dose of your medication to make sure it is accurate. Check the number of pills, and the written instructions to make sure they are clear and accurate. Make sure that you are given enough medication to last  until your next medication refill appointment. 5. Taking Medication: Take medication as prescribed. Never take more pills than instructed. Never take medication more frequently than prescribed. Taking less pills or less frequently is permitted and encouraged, when it comes to controlled substances (written prescriptions).  6. Inform other Doctors: Always inform, all of your healthcare providers, of all the medications you take. 7. Pain Medication from other Providers: You are not allowed to accept any additional pain medication from any other Doctor or Healthcare provider. There are two exceptions to this rule. (see below) In the event that you require additional pain medication, you are responsible for notifying us, as stated below. 8. Medication Agreement: You are responsible for carefully reading and following our Medication Agreement. This must be signed before receiving any prescriptions from our practice. Safely store a copy of your signed Agreement. Violations to the Agreement will result in no further prescriptions. (Additional copies of our Medication Agreement are available upon request.) 9. Laws, Rules, & Regulations: All patients are expected to follow all Federal and State Laws, Statutes, Rules, & Regulations. Ignorance of the Laws does not constitute a valid excuse. The use of any illegal substances is prohibited. 10. Adopted CDC guidelines & recommendations: Target dosing levels will be at or below 60 MME/day. Use of benzodiazepines** is not recommended.  Exceptions: There are only two exceptions to the rule of not receiving pain medications from other Healthcare Providers. 1. Exception #1 (Emergencies): In the event of an emergency (i.e.: accident requiring emergency care), you are allowed to receive additional pain medication. However, you are responsible for: As soon as you are able, call our office [phone], at any time of the day or night, and leave a message stating your name, the  date and nature of the emergency, and the name and dose of the medication   prescribed. In the event that your call is answered by a member of our staff, make sure to document and save the date, time, and the name of the person that took your information.  2. Exception #2 (Planned Surgery): In the event that you are scheduled by another doctor or dentist to have any type of surgery or procedure, you are allowed (for a period no longer than 30 days), to receive additional pain medication, for the acute post-op pain. However, in this case, you are responsible for picking up a copy of our "Post-op Pain Management for Surgeons" handout, and giving it to your surgeon or dentist. This document is available at our office, and does not require an appointment to obtain it. Simply go to our office during business hours (Monday-Thursday from 8:00 AM to 4:00 PM) (Friday 8:00 AM to 12:00 Noon) or if you have a scheduled appointment with [place], prior to your surgery, and ask for it by name. In addition, you will need to provide [place] with your name, name of your surgeon, type of surgery, and date of procedure or surgery.  *Opioid medications include: morphine, codeine, oxycodone, oxymorphone, hydrocodone, hydromorphone, meperidine, tramadol, tapentadol, buprenorphine, fentanyl, methadone. **Benzodiazepine medications include: diazepam (Valium), alprazolam (Xanax), clonazepam (Klonopine), lorazepam (Ativan), clorazepate (Tranxene), chlordiazepoxide (Librium), estazolam (Prosom), oxazepam (Serax), temazepam (Restoril), triazolam (Halcion) (Last updated: 10/14/2017) ____________________________________________________________________________________________    BMI Assessment: Estimated body mass index is 35.95 kg/m as calculated from the following:   Height as of this encounter: 6\' 2"  (1.88 m).   Weight as of this encounter: 280 lb (127 kg).  BMI interpretation table: BMI level Category Range association with higher  incidence of chronic pain  <18 kg/m2 Underweight   18.5-24.9 kg/m2 Ideal body weight   25-29.9 kg/m2 Overweight Increased incidence by 20%  30-34.9 kg/m2 Obese (Class I) Increased incidence by 68%  35-39.9 kg/m2 Severe obesity (Class II) Increased incidence by 136%  >40 kg/m2 Extreme obesity (Class III) Increased incidence by 254%   BMI Readings from Last 4 Encounters:  11/15/17 35.95 kg/m  07/23/17 37.46 kg/m  05/17/17 35.95 kg/m  05/13/17 35.95 kg/m   Wt Readings from Last 4 Encounters:  11/15/17 280 lb (127 kg)  07/23/17 291 lb 12.8 oz (132.4 kg)  05/17/17 280 lb (127 kg)  05/13/17 280 lb (127 kg)

## 2017-11-19 LAB — TOXASSURE SELECT 13 (MW), URINE

## 2017-11-29 ENCOUNTER — Other Ambulatory Visit: Payer: Self-pay

## 2017-11-29 ENCOUNTER — Ambulatory Visit
Admission: EM | Admit: 2017-11-29 | Discharge: 2017-11-29 | Disposition: A | Discharge status: Home / Self Care | Payer: 59 | Attending: Family Medicine | Admitting: Family Medicine

## 2017-11-29 ENCOUNTER — Encounter: Payer: Self-pay | Admitting: Emergency Medicine

## 2017-11-29 DIAGNOSIS — R509 Fever, unspecified: Secondary | ICD-10-CM | POA: Diagnosis not present

## 2017-11-29 LAB — CBC WITH DIFFERENTIAL/PLATELET
Basophils Absolute: 0 10*3/uL (ref 0–0.1)
Basophils Relative: 0 %
Eosinophils Absolute: 0.1 10*3/uL (ref 0–0.7)
Eosinophils Relative: 2 %
HCT: 41.7 % (ref 40.0–52.0)
Hemoglobin: 14.7 g/dL (ref 13.0–18.0)
Lymphocytes Relative: 33 %
Lymphs Abs: 1.8 10*3/uL (ref 1.0–3.6)
MCH: 29.2 pg (ref 26.0–34.0)
MCHC: 35.2 g/dL (ref 32.0–36.0)
MCV: 82.8 fL (ref 80.0–100.0)
Monocytes Absolute: 0.6 10*3/uL (ref 0.2–1.0)
Monocytes Relative: 10 %
Neutro Abs: 3.1 10*3/uL (ref 1.4–6.5)
Neutrophils Relative %: 55 %
Platelets: 238 10*3/uL (ref 150–440)
RBC: 5.04 MIL/uL (ref 4.40–5.90)
RDW: 13.1 % (ref 11.5–14.5)
WBC: 5.6 10*3/uL (ref 3.8–10.6)

## 2017-11-29 LAB — COMPREHENSIVE METABOLIC PANEL
ALT: 39 U/L (ref 17–63)
AST: 33 U/L (ref 15–41)
Albumin: 4.2 g/dL (ref 3.5–5.0)
Alkaline Phosphatase: 48 U/L (ref 38–126)
Anion gap: 8 (ref 5–15)
BUN: 10 mg/dL (ref 6–20)
CO2: 26 mmol/L (ref 22–32)
Calcium: 8.9 mg/dL (ref 8.9–10.3)
Chloride: 104 mmol/L (ref 101–111)
Creatinine, Ser: 0.97 mg/dL (ref 0.61–1.24)
GFR calc Af Amer: >60 mL/min (ref >60)
GFR calc non Af Amer: >60 mL/min (ref >60)
Glucose, Bld: 102 mg/dL — ABNORMAL HIGH (ref 65–99)
Potassium: 3.8 mmol/L (ref 3.5–5.1)
Sodium: 138 mmol/L (ref 135–145)
Total Bilirubin: 0.5 mg/dL (ref 0.3–1.2)
Total Protein: 7.6 g/dL (ref 6.5–8.1)

## 2017-11-29 NOTE — ED Triage Notes (Signed)
Patient in today c/o fever (99.5-99.7) x 6 days. Patient states he has had a low grade fever off and on x 1 month. Patient states he has had some nasal congestion over the last couple of days, but is better today.

## 2017-11-29 NOTE — ED Provider Notes (Signed)
[facility]    CSN: [id number] Arrival date & time: 11/29/17  1611     History   Chief Complaint Chief Complaint  Patient presents with  . Fever    HPI Dean Ford is a 42 y.o. male.   HPI  42 year old male presents with fever that has had for 6 days.  He has been checking his temperature since he has felt fatigued and feverish.  No source of the fever that he can tell me today.  His fevers have been running 95.9-99 7.  Takes his temperature with a ear thermometer.  He has had some nasal congestion of the last couple days but was taking NyQuill and has been better for the last day.  No other complaints.  He was concerned that he may have rheumatoid arthritis.  He has had carpal tunnel injections over the last 3 weeks.          Past Medical History:  Diagnosis Date  . Adrenal insufficiency ([place])   . Anxiety   . Arthritis   . Back pain, chronic 06/03/2015  . Hypertension   . Hypothyroid   . Sleep apnea   . Thyroid disease     Patient Active Problem List   Diagnosis Date Noted  . Acute myofascial pain 05/04/2017  . 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 Acute lumbar radiculopathy (Right) (S1) 05/26/2016  . Spasm of paraspinal muscle 01/22/2016  . Lower extremity numbness (Bilateral) (R>L) 01/22/2016  . Chronic lumbar radicular pain (right S1 and bilateral L5) (Location of Secondary source of pain) (Bilateral) (R>L) 01/22/2016  . Chronic hip pain (Right) 01/22/2016  . Osteoarthritis of hip (Right) 01/22/2016  . Chronic low back pain (Location of Primary Source of Pain) (R>L) 12/18/2015  . Erectile dysfunction 12/18/2015  . Chronic pain 09/02/2015  . Long term prescription opiate use 09/02/2015  . Encounter for chronic pain management 09/02/2015  . Chronic sacroiliac joint pain (Bilateral) (R>L) 06/11/2015  . Lumbar facet hypertrophy 06/11/2015  . Lumbosacral radiculopathy at S1 (right-sided)  06/11/2015  . Encounter for therapeutic drug level monitoring 06/03/2015  . Long term current use of opiate analgesic 06/03/2015  . Uncomplicated opioid dependence ([place]) 06/03/2015  . Opiate use 06/03/2015  . Lumbar spondylosis 06/03/2015  . Lumbar facet syndrome (Bilateral) (R>L) 06/03/2015  . Insomnia, persistent 06/03/2015  . Intermittent muscle cramps 06/03/2015  . Always thirsty 06/03/2015  . Avitaminosis D 06/03/2015  . Lumbar spine pain 06/03/2015  . Hip arthrosis 06/03/2015  . Obesity 06/03/2015  . DDD (degenerative disc disease), lumbar 06/03/2015  . L4-L5 disc bulge 06/03/2015  . Ligamentum flavum hypertrophy ([place]) 06/03/2015  . Bulging lumbar disc 06/03/2015  . Sleep apnea 06/03/2015  . Dyslipidemia 03/01/2015  . Anxiety disorder 01/28/2015  . Dehydration symptoms 01/28/2015  . Essential hypertension 07/20/2014  . Chest discomfort 07/20/2014  . Hypothyroidism 07/20/2014  . Adrenal failure ([place]) 07/20/2014    Past Surgical History:  Procedure Laterality Date  . BACK SURGERY    . ELBOW SURGERY     right elbow       Home Medications    Prior to Admission medications   Medication Sig Start Date End Date Taking? Authorizing Provider  carvedilol (COREG) 6.25 MG tablet Take 1 tablet (6.25 mg total) by mouth 2 (two) times daily. 06/02/16  Yes Nahser, Wonda Cheng, MD  fludrocortisone (FLORINEF) 0.1 MG tablet Take 0.1 mg by mouth daily as needed for dizziness.   Yes [provider]  hydrocortisone (CORTEF) 10 MG tablet Take 10 mg by mouth daily.    Yes [provider]  levothyroxine (SYNTHROID, LEVOTHROID) 75 MCG tablet Take 75 mcg by mouth daily before breakfast.    Yes [provider]  testosterone cypionate (DEPOTESTOTERONE CYPIONATE) 200 MG/ML injection Inject 100 mg into the muscle every 14 (fourteen) days.    Yes [provider]  traMADol (ULTRAM) 50 MG tablet Take 1-2 tablets (50-100 mg total) by mouth every 6 (six) hours as  needed for severe pain. 11/15/17 05/14/18 Yes King, Diona Foley, NP  valsartan (DIOVAN) 320 MG tablet TAKE 1 TABLET (320 MG TOTAL) BY MOUTH DAILY. 09/01/17  Yes Nahser, Wonda Cheng, MD    Family History Family History  Problem Relation Age of Onset  . Osteoarthritis Mother   . Heart attack Father 59  . Hypertension Father   . Hypertension Sister     Social History Social History   Tobacco Use  . Smoking status: Former Smoker    Last attempt to quit: 11/30/1995    Years since quitting: 22.0  . Smokeless tobacco: Former Systems developer    Quit date: 11/30/1995  Substance Use Topics  . Alcohol use: Yes    Alcohol/week: 0.0 oz    Comment: rarely  . Drug use: No     Allergies   Borax   Review of Systems Review of Systems  Constitutional: Negative for activity change, appetite change, chills, fatigue and fever.  HENT: Positive for congestion.   All other systems reviewed and are negative.    Physical Exam Triage Vital Signs ED Triage Vitals  Enc Vitals Group     BP 11/29/17 1620 (!) 141/89     Pulse Rate 11/29/17 1620 95     Resp 11/29/17 1620 16     Temp 11/29/17 1620 98.6 F (37 C)     Temp Source 11/29/17 1620 Oral     SpO2 11/29/17 1620 97 %     Weight 11/29/17 1621 280 lb (127 kg)     Height 11/29/17 1621 6\' 2"  (1.88 m)     Head Circumference --      Peak Flow --      Pain Score 11/29/17 1621 4     Pain Loc --      Pain Edu? --      Excl. in [place]? --    No data found.  Updated Vital Signs BP (!) 141/89 (BP Location: Left Arm)   Pulse 95   Temp 98.6 F (37 C) (Oral)   Resp 16   Ht 6\' 2"  (1.88 m)   Wt 280 lb (127 kg)   SpO2 97%   BMI 35.95 kg/m   Visual Acuity Right Eye Distance:   Left Eye Distance:   Bilateral Distance:    Right Eye Near:   Left Eye Near:    Bilateral Near:     Physical Exam  Constitutional: He is oriented to person, place, and time. He appears well-developed and well-nourished. No distress.  HENT:  Head: Normocephalic.  Right Ear:  External ear normal.  Left Ear: External ear normal.  Nose: Nose normal.  Mouth/Throat: Oropharynx is clear and moist. No oropharyngeal exudate.  Eyes: Pupils are equal, round, and reactive to light. Conjunctivae are normal. Right eye exhibits no discharge. Left eye exhibits no discharge.  Neck: Normal range of motion.  Cardiovascular: Normal rate and regular rhythm.  Pulmonary/Chest: Effort normal and breath sounds normal.  Musculoskeletal: Normal range of motion.  Lymphadenopathy:    He has no cervical adenopathy.  Neurological: He is alert and oriented to person, place, and time.  Skin: Skin is warm and dry. He is not diaphoretic.  Psychiatric: He has a normal mood and affect. His behavior is normal. Judgment and thought content normal.  Nursing note and vitals reviewed.    UC Treatments / Results  Labs (all labs ordered are listed, but only abnormal results are displayed) Labs Reviewed  COMPREHENSIVE METABOLIC PANEL - Abnormal; Notable for the following components:      Result Value   Glucose, Bld 102 (*)    All other components within normal limits  CBC WITH DIFFERENTIAL/PLATELET    EKG None Radiology No results found.  Procedures Procedures (including critical care time)  Medications Ordered in UC Medications - No data to display   Initial Impression / Assessment and Plan / UC Course  I have reviewed the triage vital signs and the nursing notes.  Pertinent labs & imaging results that were available during my care of the patient were reviewed by me and considered in my medical decision making (see chart for details).     Plan: 1. Test/x-ray results and diagnosis reviewed with patient 2. rx as per orders; risks, benefits, potential side effects reviewed with patient 3. Recommend supportive treatment with an oral thermometer which will be more reliable.  I have obtained laboratory today as a basis which are all normal.  Commend that he follow-up with his primary  care physician to discuss rheumatoid factor evaluation as necessary. 4. F/u prn if symptoms worsen or don't improve   Final Clinical Impressions(s) / UC Diagnoses   Final diagnoses:  Fever, unspecified    ED Discharge Orders    None       Controlled Substance Prescriptions [place] Controlled Substance Registry consulted? Not Applicable   Lorin Picket, PA-C 11/29/17 1741

## 2018-01-05 DIAGNOSIS — E291 Testicular hypofunction: Secondary | ICD-10-CM | POA: Diagnosis not present

## 2018-01-05 DIAGNOSIS — E039 Hypothyroidism, unspecified: Secondary | ICD-10-CM | POA: Diagnosis not present

## 2018-01-05 DIAGNOSIS — E559 Vitamin D deficiency, unspecified: Secondary | ICD-10-CM | POA: Diagnosis not present

## 2018-01-05 DIAGNOSIS — I10 Essential (primary) hypertension: Secondary | ICD-10-CM | POA: Diagnosis not present

## 2018-01-05 DIAGNOSIS — E2740 Unspecified adrenocortical insufficiency: Secondary | ICD-10-CM | POA: Diagnosis not present

## 2018-01-05 DIAGNOSIS — M2550 Pain in unspecified joint: Secondary | ICD-10-CM | POA: Diagnosis not present

## 2018-01-05 DIAGNOSIS — N529 Male erectile dysfunction, unspecified: Secondary | ICD-10-CM | POA: Diagnosis not present

## 2018-01-26 DIAGNOSIS — M7989 Other specified soft tissue disorders: Secondary | ICD-10-CM | POA: Diagnosis not present

## 2018-01-26 DIAGNOSIS — M79642 Pain in left hand: Secondary | ICD-10-CM | POA: Diagnosis not present

## 2018-01-26 DIAGNOSIS — M79643 Pain in unspecified hand: Secondary | ICD-10-CM | POA: Diagnosis not present

## 2018-01-26 DIAGNOSIS — M19072 Primary osteoarthritis, left ankle and foot: Secondary | ICD-10-CM | POA: Diagnosis not present

## 2018-01-26 DIAGNOSIS — M79673 Pain in unspecified foot: Secondary | ICD-10-CM | POA: Diagnosis not present

## 2018-01-26 DIAGNOSIS — M25542 Pain in joints of left hand: Secondary | ICD-10-CM | POA: Diagnosis not present

## 2018-01-26 DIAGNOSIS — M25569 Pain in unspecified knee: Secondary | ICD-10-CM | POA: Diagnosis not present

## 2018-01-26 DIAGNOSIS — M25571 Pain in right ankle and joints of right foot: Secondary | ICD-10-CM | POA: Diagnosis not present

## 2018-01-26 DIAGNOSIS — R21 Rash and other nonspecific skin eruption: Secondary | ICD-10-CM | POA: Diagnosis not present

## 2018-01-26 DIAGNOSIS — M25541 Pain in joints of right hand: Secondary | ICD-10-CM | POA: Diagnosis not present

## 2018-01-26 DIAGNOSIS — M549 Dorsalgia, unspecified: Secondary | ICD-10-CM | POA: Diagnosis not present

## 2018-01-26 DIAGNOSIS — M2550 Pain in unspecified joint: Secondary | ICD-10-CM | POA: Diagnosis not present

## 2018-01-26 DIAGNOSIS — M545 Low back pain: Secondary | ICD-10-CM | POA: Diagnosis not present

## 2018-01-26 DIAGNOSIS — D8989 Other specified disorders involving the immune mechanism, not elsewhere classified: Secondary | ICD-10-CM | POA: Diagnosis not present

## 2018-01-26 DIAGNOSIS — M25562 Pain in left knee: Secondary | ICD-10-CM | POA: Diagnosis not present

## 2018-01-26 DIAGNOSIS — M79641 Pain in right hand: Secondary | ICD-10-CM | POA: Diagnosis not present

## 2018-01-26 DIAGNOSIS — R5383 Other fatigue: Secondary | ICD-10-CM | POA: Diagnosis not present

## 2018-01-26 DIAGNOSIS — M1990 Unspecified osteoarthritis, unspecified site: Secondary | ICD-10-CM | POA: Diagnosis not present

## 2018-01-26 DIAGNOSIS — Z13828 Encounter for screening for other musculoskeletal disorder: Secondary | ICD-10-CM | POA: Diagnosis not present

## 2018-01-26 DIAGNOSIS — R768 Other specified abnormal immunological findings in serum: Secondary | ICD-10-CM | POA: Diagnosis not present

## 2018-01-26 DIAGNOSIS — M25561 Pain in right knee: Secondary | ICD-10-CM | POA: Diagnosis not present

## 2018-02-08 ENCOUNTER — Ambulatory Visit
Admission: RE | Admit: 2018-02-08 | Discharge: 2018-02-08 | Disposition: A | Discharge status: Home / Self Care | Payer: 59 | Source: Ambulatory Visit | Attending: Pain Medicine | Admitting: Pain Medicine

## 2018-02-08 ENCOUNTER — Encounter: Payer: Self-pay | Admitting: Pain Medicine

## 2018-02-08 ENCOUNTER — Ambulatory Visit (HOSPITAL_BASED_OUTPATIENT_CLINIC_OR_DEPARTMENT_OTHER): Payer: 59 | Admitting: Pain Medicine

## 2018-02-08 VITALS — BP 100/60 | HR 75 | Temp 97.7°F | Resp 14 | Ht 74.0 in | Wt 299.0 lb

## 2018-02-08 DIAGNOSIS — M47816 Spondylosis without myelopathy or radiculopathy, lumbar region: Secondary | ICD-10-CM | POA: Diagnosis not present

## 2018-02-08 DIAGNOSIS — Z79899 Other long term (current) drug therapy: Secondary | ICD-10-CM | POA: Diagnosis not present

## 2018-02-08 DIAGNOSIS — M8938 Hypertrophy of bone, other site: Secondary | ICD-10-CM | POA: Diagnosis not present

## 2018-02-08 DIAGNOSIS — M79605 Pain in left leg: Secondary | ICD-10-CM

## 2018-02-08 DIAGNOSIS — Z79890 Hormone replacement therapy (postmenopausal): Secondary | ICD-10-CM | POA: Insufficient documentation

## 2018-02-08 DIAGNOSIS — G8929 Other chronic pain: Secondary | ICD-10-CM | POA: Insufficient documentation

## 2018-02-08 DIAGNOSIS — M47817 Spondylosis without myelopathy or radiculopathy, lumbosacral region: Secondary | ICD-10-CM | POA: Diagnosis not present

## 2018-02-08 DIAGNOSIS — Z91048 Other nonmedicinal substance allergy status: Secondary | ICD-10-CM | POA: Insufficient documentation

## 2018-02-08 DIAGNOSIS — M545 Low back pain: Secondary | ICD-10-CM | POA: Diagnosis not present

## 2018-02-08 DIAGNOSIS — M79604 Pain in right leg: Secondary | ICD-10-CM

## 2018-02-08 DIAGNOSIS — Z7952 Long term (current) use of systemic steroids: Secondary | ICD-10-CM | POA: Insufficient documentation

## 2018-02-08 DIAGNOSIS — F419 Anxiety disorder, unspecified: Secondary | ICD-10-CM | POA: Insufficient documentation

## 2018-02-08 DIAGNOSIS — M5441 Lumbago with sciatica, right side: Secondary | ICD-10-CM

## 2018-02-08 MED ORDER — LACTATED RINGERS IV SOLN
1000.0000 mL | Freq: Once | INTRAVENOUS | Status: AC
  Administered 2018-02-08: 1000 mL via INTRAVENOUS

## 2018-02-08 MED ORDER — TRIAMCINOLONE ACETONIDE 40 MG/ML IJ SUSP
80.0000 mg | Freq: Once | INTRAMUSCULAR | Status: AC
  Administered 2018-02-08: 40 mg

## 2018-02-08 MED ORDER — TRIAMCINOLONE ACETONIDE 40 MG/ML IJ SUSP
INTRAMUSCULAR | Status: AC
  Filled 2018-02-08: qty 2

## 2018-02-08 MED ORDER — ROPIVACAINE HCL 2 MG/ML IJ SOLN
INTRAMUSCULAR | Status: AC
  Filled 2018-02-08: qty 20

## 2018-02-08 MED ORDER — ROPIVACAINE HCL 2 MG/ML IJ SOLN
18.0000 mL | Freq: Once | INTRAMUSCULAR | Status: AC
  Administered 2018-02-08: 10 mL via PERINEURAL

## 2018-02-08 MED ORDER — LIDOCAINE HCL 2 % IJ SOLN
INTRAMUSCULAR | Status: AC
  Filled 2018-02-08: qty 20

## 2018-02-08 MED ORDER — MIDAZOLAM HCL 5 MG/5ML IJ SOLN
INTRAMUSCULAR | Status: AC
  Filled 2018-02-08: qty 5

## 2018-02-08 MED ORDER — MIDAZOLAM HCL 5 MG/5ML IJ SOLN
1.0000 mg | INTRAMUSCULAR | Status: DC | PRN
  Administered 2018-02-08: 4 mg via INTRAVENOUS

## 2018-02-08 MED ORDER — LIDOCAINE HCL 2 % IJ SOLN
20.0000 mL | Freq: Once | INTRAMUSCULAR | Status: AC
  Administered 2018-02-08: 400 mg

## 2018-02-08 MED ORDER — FENTANYL CITRATE (PF) 100 MCG/2ML IJ SOLN
INTRAMUSCULAR | Status: AC
  Filled 2018-02-08: qty 2

## 2018-02-08 MED ORDER — FENTANYL CITRATE (PF) 100 MCG/2ML IJ SOLN
25.0000 ug | INTRAMUSCULAR | Status: DC | PRN
  Administered 2018-02-08: 100 ug via INTRAVENOUS

## 2018-02-08 NOTE — Patient Instructions (Signed)

## 2018-02-08 NOTE — Progress Notes (Signed)
Patient's Name: Dean Ford  MRN: [id number]  Referring Provider: No ref. provider found  DOB: 03-01-1976  PCP: Patient, No Pcp Per  DOS: 02/08/2018  Note by: Gaspar Cola, MD  Service setting: Ambulatory outpatient  Specialty: Interventional Pain Management  Patient type: Established  Location: [facility] (AMB) Pain Management Facility  Visit type: Interventional Procedure   Primary Reason for Visit: Interventional Pain Management Treatment. CC: Back Pain (lower worse on the left side )  Procedure:          Anesthesia, Analgesia, Anxiolysis:  Type: Lumbar Facet, Medial Branch Block(s) #4  Primary Purpose: Diagnostic Region: Posterolateral Lumbosacral Spine Level: L2, L3, L4, L5, & S1 Medial Branch Level(s). Injecting these levels blocks the L3-4, L4-5, and L5-S1 lumbar facet joints. Laterality: Bilateral  Type: Moderate (Conscious) Sedation combined with Local Anesthesia Indication(s): Analgesia and Anxiety Route: Intravenous (IV) IV Access: Secured Sedation: Meaningful verbal contact was maintained at all times during the procedure  Local Anesthetic: Lidocaine 1-2%   Indications: 1. Spondylosis without myelopathy or radiculopathy, lumbosacral region   2. Lumbar facet syndrome (Bilateral) (R>L)   3. Lumbar facet hypertrophy   4. Chronic low back pain (Location of Primary Source of Pain) (R>L)    Pain Score: Pre-procedure: 4 /10 Post-procedure: 1 /10  Pre-op Assessment:  Dean Ford is a 42 y.o. (year old), male patient, seen today for interventional treatment. He  has a past surgical history that includes Elbow surgery and Back surgery. Dean Ford has a current medication list which includes the following prescription(s): carvedilol, fludrocortisone, hydrocortisone, levothyroxine, testosterone cypionate, tramadol, valsartan, prednisone, and vitamin d (ergocalciferol), and the following Facility-Administered Medications: fentanyl and midazolam. His primarily concern today is the Back Pain  (lower worse on the left side )  Initial Vital Signs:  Pulse/HCG Rate: 75ECG Heart Rate: 70 Temp: 98.6 F (37 C) Resp: 16 BP: 130/76 SpO2: 99 %  BMI: Estimated body mass index is 38.39 kg/m as calculated from the following:   Height as of this encounter: 6\' 2"  (1.88 m).   Weight as of this encounter: 299 lb (135.6 kg).  Risk Assessment: Allergies: Reviewed. He is allergic to borax.  Allergy Precautions: None required Coagulopathies: Reviewed. None identified.  Blood-thinner therapy: None at this time Active Infection(s): Reviewed. None identified. Dean Ford is afebrile  Site Confirmation: Dean Ford was asked to confirm the procedure and laterality before marking the site Procedure checklist: Completed Consent: Before the procedure and under the influence of no sedative(s), amnesic(s), or anxiolytics, the patient was informed of the treatment options, risks and possible complications. To fulfill our ethical and legal obligations, as recommended by the American Medical Association's Code of Ethics, I have informed the patient of my clinical impression; the nature and purpose of the treatment or procedure; the risks, benefits, and possible complications of the intervention; the alternatives, including doing nothing; the risk(s) and benefit(s) of the alternative treatment(s) or procedure(s); and the risk(s) and benefit(s) of doing nothing. The patient was provided information about the general risks and possible complications associated with the procedure. These may include, but are not limited to: failure to achieve desired goals, infection, bleeding, organ or nerve damage, allergic reactions, paralysis, and death. In addition, the patient was informed of those risks and complications associated to Spine-related procedures, such as failure to decrease pain; infection (i.e.: Meningitis, epidural or intraspinal abscess); bleeding (i.e.: epidural hematoma, subarachnoid hemorrhage, or any other type  of intraspinal or peri-dural bleeding); organ or nerve damage (i.e.: Any type  of peripheral nerve, nerve root, or spinal cord injury) with subsequent damage to sensory, motor, and/or autonomic systems, resulting in permanent pain, numbness, and/or weakness of one or several areas of the body; allergic reactions; (i.e.: anaphylactic reaction); and/or death. Furthermore, the patient was informed of those risks and complications associated with the medications. These include, but are not limited to: allergic reactions (i.e.: anaphylactic or anaphylactoid reaction(s)); adrenal axis suppression; blood sugar elevation that in diabetics may result in ketoacidosis or comma; water retention that in patients with history of congestive heart failure may result in shortness of breath, pulmonary edema, and decompensation with resultant heart failure; weight gain; swelling or edema; medication-induced neural toxicity; particulate matter embolism and blood vessel occlusion with resultant organ, and/or nervous system infarction; and/or aseptic necrosis of one or more joints. Finally, the patient was informed that Medicine is not an exact science; therefore, there is also the possibility of unforeseen or unpredictable risks and/or possible complications that may result in a catastrophic outcome. The patient indicated having understood very clearly. We have given the patient no guarantees and we have made no promises. Enough time was given to the patient to ask questions, all of which were answered to the patient's satisfaction. Dean Ford has indicated that he wanted to continue with the procedure. Attestation: I, the ordering provider, attest that I have discussed with the patient the benefits, risks, side-effects, alternatives, likelihood of achieving goals, and potential problems during recovery for the procedure that I have provided informed consent. Date  Time: 02/08/2018  9:30 AM  Pre-Procedure Preparation:  Monitoring:  As per clinic protocol. Respiration, ETCO2, SpO2, BP, heart rate and rhythm monitor placed and checked for adequate function Safety Precautions: Patient was assessed for positional comfort and pressure points before starting the procedure. Time-out: I initiated and conducted the "Time-out" before starting the procedure, as per protocol. The patient was asked to participate by confirming the accuracy of the "Time Out" information. Verification of the correct person, site, and procedure were performed and confirmed by me, the nursing staff, and the patient. "Time-out" conducted as per Joint Commission's Universal Protocol (UP.01.01.01). Time: 1017  Description of Procedure:          Position: Prone Laterality: Bilateral. The procedure was performed in identical fashion on both sides. Levels:  L2, L3, L4, L5, & S1 Medial Branch Level(s) Area Prepped: Posterior Lumbosacral Region Prepping solution: ChloraPrep (2% chlorhexidine gluconate and 70% isopropyl alcohol) Safety Precautions: Aspiration looking for blood return was conducted prior to all injections. At no point did we inject any substances, as a needle was being advanced. Before injecting, the patient was told to immediately notify me if he was experiencing any new onset of "ringing in the ears, or metallic taste in the mouth". No attempts were made at seeking any paresthesias. Safe injection practices and needle disposal techniques used. Medications properly checked for expiration dates. SDV (single dose vial) medications used. After the completion of the procedure, all disposable equipment used was discarded in the proper designated medical waste containers. Local Anesthesia: Protocol guidelines were followed. The patient was positioned over the fluoroscopy table. The area was prepped in the usual manner. The time-out was completed. The target area was identified using fluoroscopy. A 12-in long, straight, sterile hemostat was used with fluoroscopic  guidance to locate the targets for each level blocked. Once located, the skin was marked with an approved surgical skin marker. Once all sites were marked, the skin (epidermis, dermis, and hypodermis), as well as deeper  tissues (fat, connective tissue and muscle) were infiltrated with a small amount of a short-acting local anesthetic, loaded on a 10cc syringe with a 25G, 1.5-in  Needle. An appropriate amount of time was allowed for local anesthetics to take effect before proceeding to the next step. Local Anesthetic: Lidocaine 2.0% The unused portion of the local anesthetic was discarded in the proper designated containers. Technical explanation of process:  L2 Medial Branch Nerve Block (MBB): The target area for the L2 medial branch is at the junction of the postero-lateral aspect of the superior articular process and the superior, posterior, and medial edge of the transverse process of L3. Under fluoroscopic guidance, a Quincke needle was inserted until contact was made with os over the superior postero-lateral aspect of the pedicular shadow (target area). After negative aspiration for blood, 0.5 mL of the nerve block solution was injected without difficulty or complication. The needle was removed intact. L3 Medial Branch Nerve Block (MBB): The target area for the L3 medial branch is at the junction of the postero-lateral aspect of the superior articular process and the superior, posterior, and medial edge of the transverse process of L4. Under fluoroscopic guidance, a Quincke needle was inserted until contact was made with os over the superior postero-lateral aspect of the pedicular shadow (target area). After negative aspiration for blood, 0.5 mL of the nerve block solution was injected without difficulty or complication. The needle was removed intact. L4 Medial Branch Nerve Block (MBB): The target area for the L4 medial branch is at the junction of the postero-lateral aspect of the superior articular  process and the superior, posterior, and medial edge of the transverse process of L5. Under fluoroscopic guidance, a Quincke needle was inserted until contact was made with os over the superior postero-lateral aspect of the pedicular shadow (target area). After negative aspiration for blood, 0.5 mL of the nerve block solution was injected without difficulty or complication. The needle was removed intact. L5 Medial Branch Nerve Block (MBB): The target area for the L5 medial branch is at the junction of the postero-lateral aspect of the superior articular process and the superior, posterior, and medial edge of the sacral ala. Under fluoroscopic guidance, a Quincke needle was inserted until contact was made with os over the superior postero-lateral aspect of the pedicular shadow (target area). After negative aspiration for blood, 0.5 mL of the nerve block solution was injected without difficulty or complication. The needle was removed intact. S1 Medial Branch Nerve Block (MBB): The target area for the S1 medial branch is at the posterior and inferior 6 o'clock position of the L5-S1 facet joint. Under fluoroscopic guidance, the Quincke needle inserted for the L5 MBB was redirected until contact was made with os over the inferior and postero aspect of the sacrum, at the 6 o' clock position under the L5-S1 facet joint (Target area). After negative aspiration for blood, 0.5 mL of the nerve block solution was injected without difficulty or complication. The needle was removed intact. Procedural Needles: 22-gauge, 5-inch, Quincke needles used for all levels. Nerve block solution: 0.2% PF-Ropivacaine + Triamcinolone (40 mg/mL) diluted to a final concentration of 4 mg of Triamcinolone/mL of Ropivacaine The unused portion of the solution was discarded in the proper designated containers.  Once the entire procedure was completed, the treated area was cleaned, making sure to leave some of the prepping solution back to take  advantage of its long term bactericidal properties.   Illustration of the posterior view of the lumbar spine  and the posterior neural structures. Laminae of L2 through S1 are labeled. DPRL5, dorsal primary ramus of L5; DPRS1, dorsal primary ramus of S1; DPR3, dorsal primary ramus of L3; FJ, facet (zygapophyseal) joint L3-L4; I, inferior articular process of L4; LB1, lateral branch of dorsal primary ramus of L1; IAB, inferior articular branches from L3 medial branch (supplies L4-L5 facet joint); IBP, intermediate branch plexus; MB3, medial branch of dorsal primary ramus of L3; NR3, third lumbar nerve root; S, superior articular process of L5; SAB, superior articular branches from L4 (supplies L4-5 facet joint also); TP3, transverse process of L3.  Vitals:   02/08/18 1037 02/08/18 1048 02/08/18 1053 02/08/18 1058  BP: 111/63 (!) 118/35 (!) 107/56 100/60  Pulse:      Resp: 12 12  14   Temp: 97.9 F (36.6 C)   97.7 F (36.5 C)  TempSrc:    Temporal  SpO2: 96% 96%  96%  Weight:      Height:        Start Time: 1017 hrs. End Time: 1030 hrs.  Imaging Guidance (Spinal):          Type of Imaging Technique: Fluoroscopy Guidance (Spinal) Indication(s): Assistance in needle guidance and placement for procedures requiring needle placement in or near specific anatomical locations not easily accessible without such assistance. Exposure Time: Please see nurses notes. Contrast: None used. Fluoroscopic Guidance: I was personally present during the use of fluoroscopy. "Tunnel Vision Technique" used to obtain the best possible view of the target area. Parallax error corrected before commencing the procedure. "Direction-depth-direction" technique used to introduce the needle under continuous pulsed fluoroscopy. Once target was reached, antero-posterior, oblique, and lateral fluoroscopic projection used confirm needle placement in all planes. Images permanently stored in EMR. Interpretation: No contrast injected.  I personally interpreted the imaging intraoperatively. Adequate needle placement confirmed in multiple planes. Permanent images saved into the patient's record.  Antibiotic Prophylaxis:   Anti-infectives (From admission, onward)   None     Indication(s): None identified  Post-operative Assessment:  Post-procedure Vital Signs:  Pulse/HCG Rate: 7573 Temp: 97.7 F (36.5 C) Resp: 14 BP: 100/60 SpO2: 96 %  EBL: None  Complications: No immediate post-treatment complications observed by team, or reported by patient.  Note: The patient tolerated the entire procedure well. A repeat set of vitals were taken after the procedure and the patient was kept under observation following institutional policy, for this type of procedure. Post-procedural neurological assessment was performed, showing return to baseline, prior to discharge. The patient was provided with post-procedure discharge instructions, including a section on how to identify potential problems. Should any problems arise concerning this procedure, the patient was given instructions to immediately contact us, at any time, without hesitation. In any case, we plan to contact the patient by telephone for a follow-up status report regarding this interventional procedure.  Comments:  No additional relevant information.  Plan of Care    Imaging Orders     DG C-Arm 1-60 Min-No Report  Procedure Orders     LUMBAR FACET(MEDIAL BRANCH NERVE BLOCK) MBNB  Medications ordered for procedure: Meds ordered this encounter  Medications  . lidocaine (XYLOCAINE) 2 % (with pres) injection 400 mg  . midazolam (VERSED) 5 MG/5ML injection 1-2 mg    Make sure Flumazenil is available in the pyxis when using this medication. If oversedation occurs, administer 0.2 mg IV over 15 sec. If after 45 sec no response, administer 0.2 mg again over 1 min; may repeat at 1 min intervals; not  to exceed 4 doses (1 mg)  . fentaNYL (SUBLIMAZE) injection 25-50 mcg     Make sure Narcan is available in the pyxis when using this medication. In the event of respiratory depression (RR< 8/min): Titrate NARCAN (naloxone) in increments of 0.1 to 0.2 mg IV at 2-3 minute intervals, until desired degree of reversal.  . lactated ringers infusion 1,000 mL  . ropivacaine (PF) 2 mg/mL (0.2%) (NAROPIN) injection 18 mL  . triamcinolone acetonide (KENALOG-40) injection 80 mg   Medications administered: We administered lidocaine, midazolam, fentaNYL, lactated ringers, ropivacaine (PF) 2 mg/mL (0.2%), and triamcinolone acetonide.  See the medical record for exact dosing, route, and time of administration.  New Prescriptions   No medications on file   Disposition: Discharge home  Discharge Date & Time: 02/08/2018; 1112 hrs.   Physician-requested Follow-up: Return for post-procedure eval (2 wks), w/ Dionisio David, NP.  Future Appointments  Date Time Provider [place]  02/22/2018  8:45 AM Vevelyn Francois, NP [facility] None  04/25/2018  8:45 AM Vevelyn Francois, NP [facility] None   Primary Care Physician: Patient, No Pcp Per Location: [facility] Note by: Gaspar Cola, MD Date: 02/08/2018; Time: 11:46 AM  Disclaimer:  Medicine is not an Chief Strategy Officer. The only guarantee in medicine is that nothing is guaranteed. It is important to note that the decision to proceed with this intervention was based on the information collected from the patient. The Data and conclusions were drawn from the patient's questionnaire, the interview, and the physical examination. Because the information was provided in large part by the patient, it cannot be guaranteed that it has not been purposely or unconsciously manipulated. Every effort has been made to obtain as much relevant data as possible for this evaluation. It is important to note that the conclusions that lead to this procedure are derived in large part from the available data. Always take into  account that the treatment will also be dependent on availability of resources and existing treatment guidelines, considered by other Pain Management Practitioners as being common knowledge and practice, at the time of the intervention. For Medico-Legal purposes, it is also important to point out that variation in procedural techniques and pharmacological choices are the acceptable norm. The indications, contraindications, technique, and results of the above procedure should only be interpreted and judged by a Board-Certified Interventional Pain Specialist with extensive familiarity and expertise in the same exact procedure and technique.

## 2018-02-09 ENCOUNTER — Telehealth: Payer: Self-pay

## 2018-02-09 ENCOUNTER — Other Ambulatory Visit: Payer: Self-pay | Admitting: Cardiovascular Disease

## 2018-02-09 NOTE — Telephone Encounter (Signed)
Attempted to call patient for post procedure

## 2018-02-14 DIAGNOSIS — M7989 Other specified soft tissue disorders: Secondary | ICD-10-CM | POA: Diagnosis not present

## 2018-02-14 DIAGNOSIS — R5383 Other fatigue: Secondary | ICD-10-CM | POA: Diagnosis not present

## 2018-02-14 DIAGNOSIS — E559 Vitamin D deficiency, unspecified: Secondary | ICD-10-CM | POA: Diagnosis not present

## 2018-02-14 DIAGNOSIS — M25569 Pain in unspecified knee: Secondary | ICD-10-CM | POA: Diagnosis not present

## 2018-02-14 DIAGNOSIS — M79643 Pain in unspecified hand: Secondary | ICD-10-CM | POA: Diagnosis not present

## 2018-02-14 DIAGNOSIS — M2550 Pain in unspecified joint: Secondary | ICD-10-CM | POA: Diagnosis not present

## 2018-02-14 DIAGNOSIS — M1990 Unspecified osteoarthritis, unspecified site: Secondary | ICD-10-CM | POA: Diagnosis not present

## 2018-02-14 DIAGNOSIS — E039 Hypothyroidism, unspecified: Secondary | ICD-10-CM | POA: Diagnosis not present

## 2018-02-14 DIAGNOSIS — R768 Other specified abnormal immunological findings in serum: Secondary | ICD-10-CM | POA: Diagnosis not present

## 2018-02-14 DIAGNOSIS — M549 Dorsalgia, unspecified: Secondary | ICD-10-CM | POA: Diagnosis not present

## 2018-02-14 DIAGNOSIS — R21 Rash and other nonspecific skin eruption: Secondary | ICD-10-CM | POA: Diagnosis not present

## 2018-02-22 ENCOUNTER — Ambulatory Visit: Payer: 59 | Attending: Nurse Practitioner | Admitting: Nurse Practitioner

## 2018-03-15 ENCOUNTER — Emergency Department
Admission: EM | Admit: 2018-03-15 | Discharge: 2018-03-15 | Disposition: A | Discharge status: Home / Self Care | Payer: 59 | Attending: Emergency Medicine | Admitting: Emergency Medicine

## 2018-03-15 ENCOUNTER — Encounter: Payer: Self-pay | Admitting: Emergency Medicine

## 2018-03-15 ENCOUNTER — Other Ambulatory Visit: Payer: Self-pay

## 2018-03-15 DIAGNOSIS — E860 Dehydration: Secondary | ICD-10-CM | POA: Insufficient documentation

## 2018-03-15 DIAGNOSIS — I10 Essential (primary) hypertension: Secondary | ICD-10-CM | POA: Insufficient documentation

## 2018-03-15 DIAGNOSIS — R1030 Lower abdominal pain, unspecified: Secondary | ICD-10-CM | POA: Insufficient documentation

## 2018-03-15 DIAGNOSIS — Z87891 Personal history of nicotine dependence: Secondary | ICD-10-CM | POA: Diagnosis not present

## 2018-03-15 DIAGNOSIS — M6281 Muscle weakness (generalized): Secondary | ICD-10-CM | POA: Insufficient documentation

## 2018-03-15 DIAGNOSIS — E039 Hypothyroidism, unspecified: Secondary | ICD-10-CM | POA: Diagnosis not present

## 2018-03-15 DIAGNOSIS — Z79899 Other long term (current) drug therapy: Secondary | ICD-10-CM | POA: Diagnosis not present

## 2018-03-15 LAB — URINALYSIS, COMPLETE (UACMP) WITH MICROSCOPIC
BACTERIA UA: NONE SEEN
Bilirubin Urine: NEGATIVE
Glucose, UA: NEGATIVE mg/dL
HGB URINE DIPSTICK: NEGATIVE
Ketones, ur: NEGATIVE mg/dL
Leukocytes, UA: NEGATIVE
NITRITE: NEGATIVE
PROTEIN: NEGATIVE mg/dL
Specific Gravity, Urine: 1.027 (ref 1.005–1.030)
pH: 5 (ref 5.0–8.0)

## 2018-03-15 LAB — CBC WITH DIFFERENTIAL/PLATELET
BASOS ABS: 0 10*3/uL (ref 0–0.1)
BASOS PCT: 0 %
EOS ABS: 0 10*3/uL (ref 0–0.7)
Eosinophils Relative: 1 %
HCT: 47.4 % (ref 40.0–52.0)
HEMOGLOBIN: 16.4 g/dL (ref 13.0–18.0)
Lymphocytes Relative: 14 %
Lymphs Abs: 1 10*3/uL (ref 1.0–3.6)
MCH: 30 pg (ref 26.0–34.0)
MCHC: 34.5 g/dL (ref 32.0–36.0)
MCV: 86.7 fL (ref 80.0–100.0)
Monocytes Absolute: 0.3 10*3/uL (ref 0.2–1.0)
Monocytes Relative: 5 %
NEUTROS PCT: 80 %
Neutro Abs: 6.1 10*3/uL (ref 1.4–6.5)
Platelets: 250 10*3/uL (ref 150–440)
RBC: 5.47 MIL/uL (ref 4.40–5.90)
RDW: 13.8 % (ref 11.5–14.5)
WBC: 7.5 10*3/uL (ref 3.8–10.6)

## 2018-03-15 LAB — COMPREHENSIVE METABOLIC PANEL
ALBUMIN: 4.8 g/dL (ref 3.5–5.0)
ALK PHOS: 46 U/L (ref 38–126)
ALT: 46 U/L — ABNORMAL HIGH (ref 0–44)
AST: 30 U/L (ref 15–41)
Anion gap: 6 (ref 5–15)
BUN: 14 mg/dL (ref 6–20)
CALCIUM: 9.2 mg/dL (ref 8.9–10.3)
CO2: 29 mmol/L (ref 22–32)
CREATININE: 0.98 mg/dL (ref 0.61–1.24)
Chloride: 105 mmol/L (ref 98–111)
GFR calc Af Amer: >60 mL/min (ref >60)
GFR calc non Af Amer: >60 mL/min (ref >60)
GLUCOSE: 106 mg/dL — AB (ref 70–99)
Potassium: 4.4 mmol/L (ref 3.5–5.1)
SODIUM: 140 mmol/L (ref 135–145)
Total Bilirubin: 0.7 mg/dL (ref 0.3–1.2)
Total Protein: 7.6 g/dL (ref 6.5–8.1)

## 2018-03-15 LAB — GLUCOSE, CAPILLARY: GLUCOSE-CAPILLARY: 98 mg/dL (ref 70–99)

## 2018-03-15 LAB — ETHANOL: Alcohol, Ethyl (B): <10 mg/dL (ref <10)

## 2018-03-15 MED ORDER — HYDROCORTISONE NA SUCCINATE PF 100 MG IJ SOLR
100.0000 mg | Freq: Once | INTRAMUSCULAR | Status: AC
  Administered 2018-03-15: 100 mg via INTRAVENOUS
  Filled 2018-03-15: qty 2

## 2018-03-15 MED ORDER — SODIUM CHLORIDE 0.9 % IV BOLUS
1000.0000 mL | Freq: Once | INTRAVENOUS | Status: AC
  Administered 2018-03-15: 1000 mL via INTRAVENOUS

## 2018-03-15 NOTE — Discharge Instructions (Signed)
Call your endocrinologist tomorrow to discuss the follow-up plan and whether you need to be placed on further steroids.  Return to the ER for new, worsening, persistent severe weakness, pain, difficulty breathing, vomiting, or any other new or worsening symptoms that concern he.

## 2018-03-15 NOTE — ED Provider Notes (Signed)
[facility] Emergency Department Provider Note ____________________________________________   First MD Initiated Contact with Patient 03/15/18 1631     (approximate)  I have reviewed the triage vital signs and the nursing notes.   HISTORY  Chief Complaint Diarrhea and Flank Pain    HPI Dean Ford is a 42 y.o. male with PMH as noted below including adrenal insufficiency who presents with multiple complaints, but primarily generalized weakness, gradual onset over the last several days, and associated with bilateral flank pain over the last few weeks, muscle cramps, diarrhea, and headache.  Patient denies shortness of breath, chest pain, vomiting, or fever.  He states that he spoke to his endocrinologist, who suggested that he is likely having an adrenal crisis and instructed to come to the emergency department for IV fluids and further evaluation.   Past Medical History:  Diagnosis Date  . Adrenal insufficiency ([place])   . Anxiety   . Arthritis   . Back pain, chronic 06/03/2015  . Hypertension   . Hypothyroid   . Sleep apnea   . Thyroid disease     Patient Active Problem List   Diagnosis Date Noted  . Spondylosis without myelopathy or radiculopathy, lumbosacral region 02/08/2018  . Chronic lower extremity pain (Secondary Area of Pain) (Bilateral) (R>L) 02/08/2018  . Acute myofascial pain 05/04/2017  . 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 Acute lumbar radiculopathy (Right) (S1) 05/26/2016  . Spasm of paraspinal muscle 01/22/2016  . Lower extremity numbness (Bilateral) (R>L) 01/22/2016  . Chronic lumbar radicular pain (Right:S1, Bilateral:L5) (Bilateral) (R>L) 01/22/2016  . Chronic hip pain (Right) 01/22/2016  . Osteoarthritis of hip (Right) 01/22/2016  . Chronic low back pain (Primary Source of Pain) (R>L) 12/18/2015  . Erectile dysfunction 12/18/2015  . Chronic pain 09/02/2015  . Long term  prescription opiate use 09/02/2015  . Encounter for chronic pain management 09/02/2015  . Chronic sacroiliac joint pain (Bilateral) (R>L) 06/11/2015  . Lumbar facet hypertrophy 06/11/2015  . Lumbosacral radiculopathy at S1 (right-sided) 06/11/2015  . Encounter for therapeutic drug level monitoring 06/03/2015  . Long term current use of opiate analgesic 06/03/2015  . Uncomplicated opioid dependence ([place]) 06/03/2015  . Opiate use 06/03/2015  . Lumbar spondylosis 06/03/2015  . Lumbar facet syndrome (Bilateral) (R>L) 06/03/2015  . Insomnia, persistent 06/03/2015  . Intermittent muscle cramps 06/03/2015  . Always thirsty 06/03/2015  . Avitaminosis D 06/03/2015  . Lumbar spine pain 06/03/2015  . Hip arthrosis 06/03/2015  . Obesity 06/03/2015  . DDD (degenerative disc disease), lumbar 06/03/2015  . L4-L5 disc bulge 06/03/2015  . Ligamentum flavum hypertrophy ([place]) 06/03/2015  . Bulging lumbar disc 06/03/2015  . Sleep apnea 06/03/2015  . Dyslipidemia 03/01/2015  . Anxiety disorder 01/28/2015  . Dehydration symptoms 01/28/2015  . Essential hypertension 07/20/2014  . Chest discomfort 07/20/2014  . Hypothyroidism 07/20/2014  . Adrenal failure ([place]) 07/20/2014    Past Surgical History:  Procedure Laterality Date  . BACK SURGERY    . ELBOW SURGERY     right elbow    Prior to Admission medications   Medication Sig Start Date End Date Taking? Authorizing Provider  carvedilol (COREG) 6.25 MG tablet TAKE 1 TABLET (6.25 MG TOTAL) BY MOUTH 2 (TWO) TIMES DAILY. 02/09/18  Yes Nahser, Wonda Cheng, MD  hydrocortisone (CORTEF) 10 MG tablet Take 10 mg by mouth daily.    Yes [provider]  hydroxychloroquine (PLAQUENIL) 200 MG tablet Take 1 tablet by mouth 2 (two)  times daily. 02/15/18  Yes [provider]  levothyroxine (SYNTHROID, LEVOTHROID) 100 MCG tablet Take 1 tablet by mouth daily. 02/09/18  Yes [provider]  predniSONE (DELTASONE) 5 MG tablet Take 7.5 mg by mouth  daily. 01/05/18  Yes [provider]  testosterone cypionate (DEPOTESTOTERONE CYPIONATE) 200 MG/ML injection Inject 100 mg into the muscle every 14 (fourteen) days.    Yes [provider]  valsartan (DIOVAN) 320 MG tablet TAKE 1 TABLET (320 MG TOTAL) BY MOUTH DAILY. 09/01/17  Yes Nahser, Wonda Cheng, MD  Vitamin D, Ergocalciferol, (DRISDOL) 50000 units CAPS capsule Take 50,000 Units by mouth once a week. 01/06/18  Yes [provider]  fludrocortisone (FLORINEF) 0.1 MG tablet Take 0.1 mg by mouth daily as needed for dizziness.    [provider]  sildenafil (VIAGRA) 100 MG tablet Take 1 tablet by mouth daily as needed. 02/09/18   [provider]  traMADol (ULTRAM) 50 MG tablet Take 1-2 tablets (50-100 mg total) by mouth every 6 (six) hours as needed for severe pain. 11/15/17 05/14/18  Vevelyn Francois, NP    Allergies Borax  Family History  Problem Relation Age of Onset  . Osteoarthritis Mother   . Heart attack Father 81  . Hypertension Father   . Hypertension Sister     Social History Social History   Tobacco Use  . Smoking status: Former Smoker    Last attempt to quit: 11/30/1995    Years since quitting: 22.3  . Smokeless tobacco: Former Systems developer    Quit date: 11/30/1995  Substance Use Topics  . Alcohol use: Yes    Alcohol/week: 0.0 oz    Comment: rarely  . Drug use: No    Review of Systems  Constitutional: No fever.  Positive for generalized weakness. Eyes: No redness. ENT: No sore throat. Cardiovascular: Denies chest pain. Respiratory: Denies shortness of breath. Gastrointestinal: No vomiting. Genitourinary: Negative for dysuria.  Positive for flank pain. Musculoskeletal: Positive for muscle cramps. Skin: Negative for rash. Neurological: Positive for headache.   ____________________________________________   PHYSICAL EXAM:  VITAL SIGNS: ED Triage Vitals  Enc Vitals Group     BP 03/15/18 1306 (!) 159/93     Pulse Rate 03/15/18  1306 74     Resp 03/15/18 1306 18     Temp 03/15/18 1305 98.5 F (36.9 C)     Temp Source 03/15/18 1305 Oral     SpO2 03/15/18 1306 97 %     Weight 03/15/18 1306 295 lb (133.8 kg)     Height 03/15/18 1306 6\' 2"  (1.88 m)     Head Circumference --      Peak Flow --      Pain Score 03/15/18 1306 4     Pain Loc --      Pain Edu? --      Excl. in [place]? --     Constitutional: Alert and oriented. Well appearing and in no acute distress. Eyes: Conjunctivae are normal.  Head: Atraumatic. Nose: No congestion/rhinnorhea. Mouth/Throat: Mucous membranes are dry.   Neck: Normal range of motion.  Cardiovascular: Normal rate, regular rhythm. Grossly normal heart sounds.  Good peripheral circulation. Respiratory: Normal respiratory effort.  No retractions. Lungs CTAB. Gastrointestinal: Soft and nontender. No distention.  Genitourinary: No CVA tenderness. Musculoskeletal: Extremities warm and well perfused.  Neurologic:  Normal speech and language. No gross focal neurologic deficits are appreciated.  Skin:  Skin is warm and dry. No rash noted. Psychiatric: Mood and affect are normal.  Speech and behavior are normal.  ____________________________________________   LABS (all labs ordered are listed, but only abnormal results are displayed)  Labs Reviewed  COMPREHENSIVE METABOLIC PANEL - Abnormal; Notable for the following components:      Result Value   Glucose, Bld 106 (*)    ALT 46 (*)    All other components within normal limits  URINALYSIS, COMPLETE (UACMP) WITH MICROSCOPIC - Abnormal; Notable for the following components:   Color, Urine YELLOW (*)    APPearance HAZY (*)    All other components within normal limits  CBC WITH DIFFERENTIAL/PLATELET  ETHANOL  GLUCOSE, CAPILLARY  CALCIUM, IONIZED  CBG MONITORING, ED    ____________________________________________  EKG   ____________________________________________  RADIOLOGY    ____________________________________________   PROCEDURES  Procedure(s) performed: No  Procedures  Critical Care performed: No ____________________________________________   INITIAL IMPRESSION / ASSESSMENT AND PLAN / ED COURSE  Pertinent labs & imaging results that were available during my care of the patient were reviewed by me and considered in my medical decision making (see chart for details).  42 year old male with PMH as noted above presents with generalized weakness, as well as several other complaints including bilateral flank pain, diarrhea, and headache as well as muscle cramps over the last few weeks.  He is concerned he is having an adrenal crisis.  On exam, the vital signs are normal except for hypertension.  The patient is actually remarkably well appearing, and the only significant exam finding is that he has dry mucous membranes.  Overall I suspect most likely dehydration, and likely component of adrenal insufficiency.  There is differential also includes UTI/pyelonephritis, gastroenteritis, electrolyte abnormality, or renal insufficiency.  We will obtain labs, give fluids, give a dose of Solu-Cortef, and reassess.  ----------------------------------------- 6:22 PM on 03/15/2018 -----------------------------------------  The lab work-up is unremarkable.  Patient is feeling somewhat better after fluids and the hydrocortisone.  He is already on hydrocortisone orally as maintenance, site instructed him to continue this.  He will talk to his endocrinologist tomorrow to arrange for follow-up.  Return precautions given, and he expresses understanding.  He feels well and would like to go home.   ____________________________________________   FINAL CLINICAL IMPRESSION(S) / ED DIAGNOSES  Final diagnoses:  Dehydration      NEW MEDICATIONS  STARTED DURING THIS VISIT:  New Prescriptions   No medications on file     Note:  This document was prepared using Dragon voice recognition software and may include unintentional dictation errors.    Arta Silence, MD 03/15/18 [facility]

## 2018-03-15 NOTE — ED Triage Notes (Signed)
Patient reports diarrhea, weakness, dehydration and bilateral flank pain worsening over the last 3 weeks. States he is trying to stay hydrated but is having worsening muscle cramps and headache with increased thirst. States he is being treated by endocrinologist for adrenal insufficiency and hypothyroidism and being treated for addison's disease but is unsure if he has been officially diagnosed. States after speaking with dr today he was instructed to come to ED for further evaluation and IV fluids.

## 2018-03-16 LAB — CALCIUM, IONIZED: Calcium, Ionized, Serum: 5 mg/dL (ref 4.5–5.6)

## 2018-04-19 DIAGNOSIS — M1990 Unspecified osteoarthritis, unspecified site: Secondary | ICD-10-CM | POA: Diagnosis not present

## 2018-04-19 DIAGNOSIS — Z79899 Other long term (current) drug therapy: Secondary | ICD-10-CM | POA: Diagnosis not present

## 2018-04-19 DIAGNOSIS — M0589 Other rheumatoid arthritis with rheumatoid factor of multiple sites: Secondary | ICD-10-CM | POA: Diagnosis not present

## 2018-04-19 DIAGNOSIS — G5600 Carpal tunnel syndrome, unspecified upper limb: Secondary | ICD-10-CM | POA: Diagnosis not present

## 2018-04-19 DIAGNOSIS — M0579 Rheumatoid arthritis with rheumatoid factor of multiple sites without organ or systems involvement: Secondary | ICD-10-CM | POA: Diagnosis not present

## 2018-04-25 ENCOUNTER — Ambulatory Visit: Payer: 59 | Attending: Nurse Practitioner | Admitting: Nurse Practitioner

## 2018-04-25 ENCOUNTER — Other Ambulatory Visit: Payer: Self-pay

## 2018-04-25 ENCOUNTER — Encounter: Payer: Self-pay | Admitting: Nurse Practitioner

## 2018-04-25 VITALS — BP 131/84 | HR 77 | Temp 98.5°F | Ht 74.0 in | Wt 295.0 lb

## 2018-04-25 DIAGNOSIS — G8929 Other chronic pain: Secondary | ICD-10-CM | POA: Insufficient documentation

## 2018-04-25 DIAGNOSIS — I10 Essential (primary) hypertension: Secondary | ICD-10-CM | POA: Insufficient documentation

## 2018-04-25 DIAGNOSIS — M79604 Pain in right leg: Secondary | ICD-10-CM | POA: Diagnosis not present

## 2018-04-25 DIAGNOSIS — M5116 Intervertebral disc disorders with radiculopathy, lumbar region: Secondary | ICD-10-CM | POA: Insufficient documentation

## 2018-04-25 DIAGNOSIS — R0789 Other chest pain: Secondary | ICD-10-CM | POA: Diagnosis not present

## 2018-04-25 DIAGNOSIS — F1120 Opioid dependence, uncomplicated: Secondary | ICD-10-CM | POA: Insufficient documentation

## 2018-04-25 DIAGNOSIS — M4726 Other spondylosis with radiculopathy, lumbar region: Secondary | ICD-10-CM | POA: Diagnosis not present

## 2018-04-25 DIAGNOSIS — Z79899 Other long term (current) drug therapy: Secondary | ICD-10-CM | POA: Insufficient documentation

## 2018-04-25 DIAGNOSIS — E559 Vitamin D deficiency, unspecified: Secondary | ICD-10-CM | POA: Insufficient documentation

## 2018-04-25 DIAGNOSIS — G4730 Sleep apnea, unspecified: Secondary | ICD-10-CM | POA: Diagnosis not present

## 2018-04-25 DIAGNOSIS — G4700 Insomnia, unspecified: Secondary | ICD-10-CM | POA: Diagnosis not present

## 2018-04-25 DIAGNOSIS — E669 Obesity, unspecified: Secondary | ICD-10-CM | POA: Diagnosis not present

## 2018-04-25 DIAGNOSIS — N529 Male erectile dysfunction, unspecified: Secondary | ICD-10-CM | POA: Diagnosis not present

## 2018-04-25 DIAGNOSIS — E785 Hyperlipidemia, unspecified: Secondary | ICD-10-CM | POA: Diagnosis not present

## 2018-04-25 DIAGNOSIS — E860 Dehydration: Secondary | ICD-10-CM | POA: Insufficient documentation

## 2018-04-25 DIAGNOSIS — M47816 Spondylosis without myelopathy or radiculopathy, lumbar region: Secondary | ICD-10-CM | POA: Diagnosis not present

## 2018-04-25 DIAGNOSIS — M79605 Pain in left leg: Secondary | ICD-10-CM | POA: Diagnosis not present

## 2018-04-25 DIAGNOSIS — M545 Low back pain: Secondary | ICD-10-CM | POA: Insufficient documentation

## 2018-04-25 DIAGNOSIS — G894 Chronic pain syndrome: Secondary | ICD-10-CM | POA: Insufficient documentation

## 2018-04-25 DIAGNOSIS — M7918 Myalgia, other site: Secondary | ICD-10-CM | POA: Diagnosis not present

## 2018-04-25 DIAGNOSIS — E039 Hypothyroidism, unspecified: Secondary | ICD-10-CM | POA: Insufficient documentation

## 2018-04-25 DIAGNOSIS — Z87891 Personal history of nicotine dependence: Secondary | ICD-10-CM | POA: Insufficient documentation

## 2018-04-25 DIAGNOSIS — F419 Anxiety disorder, unspecified: Secondary | ICD-10-CM | POA: Diagnosis not present

## 2018-04-25 DIAGNOSIS — M1611 Unilateral primary osteoarthritis, right hip: Secondary | ICD-10-CM | POA: Insufficient documentation

## 2018-04-25 DIAGNOSIS — M533 Sacrococcygeal disorders, not elsewhere classified: Secondary | ICD-10-CM | POA: Diagnosis not present

## 2018-04-25 DIAGNOSIS — Z79891 Long term (current) use of opiate analgesic: Secondary | ICD-10-CM | POA: Insufficient documentation

## 2018-04-25 DIAGNOSIS — M47897 Other spondylosis, lumbosacral region: Secondary | ICD-10-CM | POA: Diagnosis not present

## 2018-04-25 MED ORDER — METHOCARBAMOL 500 MG PO TABS: 500.0000 mg | ORAL_TABLET | Freq: Three times a day (TID) | ORAL | 5 refills | Status: AC | PRN

## 2018-04-25 MED ORDER — TRAMADOL HCL 50 MG PO TABS: 50.0000 mg | ORAL_TABLET | Freq: Four times a day (QID) | ORAL | 5 refills | Status: DC | PRN

## 2018-04-25 NOTE — Progress Notes (Signed)
Patient's Name: Dean Ford  MRN: [id number]  Referring Provider: No ref. provider found  DOB: 12-12-1975  PCP: Patient, No Pcp Per  DOS: 04/25/2018  Note by: Vevelyn Francois NP  Service setting: Ambulatory outpatient  Specialty: Interventional Pain Management  Location: [facility] (AMB) Pain Management Facility    Patient type: Established    Primary Reason(s) for Visit: Encounter for prescription drug management & post-procedure evaluation of chronic illness with mild to moderate exacerbation(Level of risk: moderate) CC: Back Pain  HPI  Dean Ford is a 42 y.o. year old, male patient, who comes today for a post-procedure evaluation and medication management. 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[place]); Anxiety disorder; Dehydration symptoms; Dyslipidemia; Encounter for therapeutic drug level monitoring; Long term current use of opiate analgesic; Uncomplicated opioid dependence ([place]); Opiate use; Lumbar spondylosis; Lumbar facet syndrome (Bilateral) (R>L); Insomnia, persistent; Intermittent muscle cramps; Always thirsty; Avitaminosis D; Lumbar spine pain; Hip arthrosis; Obesity; DDD (degenerative disc disease), lumbar; L4-L5 disc bulge; Ligamentum flavum hypertrophy (HCC); Bulging lumbar disc; Sleep apnea; Chronic sacroiliac joint pain (Bilateral) (R>L); Lumbar facet hypertrophy; Lumbosacral radiculopathy at S1 (right-sided); Chronic pain; Long term prescription opiate use; Encounter for chronic pain management; Chronic low back pain (Primary Source of Pain) (R>L); Erectile dysfunction; Spasm of paraspinal muscle; Lower extremity numbness (Bilateral) (R>L); Chronic lumbar radicular pain (Right:S1, Bilateral:L5) (Bilateral) (R>L); Chronic hip pain (Right); Osteoarthritis of hip (Right); Acute lumbar radiculopathy (Right) (S1); 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 His primarily concern today is the Back Pain  Pain Assessment: Location: Lower Back Radiating: Denies Onset: More than a month ago Duration: Chronic pain Quality: Aching, Burning Severity: 3 /10 (subjective, self-reported pain score)  Note: Reported level is compatible with observation.                          Effect on ADL: limits my daily activities Timing: Intermittent Modifying factors: lay down and medications BP: 131/84  HR: 77  Dean Ford was last seen on 02/22/2018 for a procedure. During today's appointment we reviewed Dean Ford post-procedure results, as well as his outpatient medication regimen. He admits that he is having muscle tightness.  He admits that is worse when he is working. He is asking for a muscle relaxer. He admits that the facet was effective short term.  He has started treatment for his arthritis is currently on Plaquenil.   Further details on both, my assessment(s), as well as the proposed treatment plan, please see below.  Controlled Substance Pharmacotherapy Assessment REMS (Risk Evaluation and Mitigation Strategy)  Analgesic:Tramadol 100 mg every 6 hours (400 mg/day of tramadol)  MME/day:40 mg/day  Chauncey Fischer, RN  04/25/2018 10:03 AM  Sign at close encounter Nursing Pain Medication Assessment:  Safety precautions to be maintained throughout the outpatient stay will include: orient to surroundings, keep bed in low position, maintain call bell within reach at all times, provide assistance with transfer out of bed and ambulation.  Medication Inspection Compliance: Pill count conducted under aseptic conditions, in front of the patient. Neither the pills nor the bottle was removed from the patient's sight at any time. Once count was completed pills were immediately returned to the patient in their  original bottle.  Medication: Tramadol (Ultram) Pill/Patch Count: 184 of  240 pills remain Pill/Patch Appearance: Markings consistent with prescribed medication Bottle Appearance: Standard pharmacy container. Clearly labeled. Filled Date: 8 / 30 / 2019 Last Medication intake:  Today   Pharmacokinetics: Liberation and absorption (onset of action): WNL Distribution (time to peak effect): WNL Metabolism and excretion (duration of action): WNL         Pharmacodynamics: Desired effects: Analgesia: [name] reports >50% benefit. Functional ability: Patient reports that medication allows him to accomplish basic ADLs Clinically meaningful improvement in function (CMIF): Sustained CMIF goals met Perceived effectiveness: Described as relatively effective but with some room for improvement Undesirable effects: Side-effects or Adverse reactions: None reported Monitoring: [place] PMP: Online review of the past 64-monthperiod conducted. Compliant with practice rules and regulations Last UDS on record: Summary  Date Value Ref Range Status  11/15/2017 FINAL  Final    Comment:    ==================================================================== TOXASSURE SELECT 13 (MW) ==================================================================== Test                             Result       Flag       Units Drug Present not Declared for Prescription Verification   Tramadol                       >2890        UNEXPECTED ng/mg creat   O-Desmethyltramadol            >2890        UNEXPECTED ng/mg creat   N-Desmethyltramadol            1071         UNEXPECTED ng/mg creat    Source of tramadol is a prescription medication.    O-desmethyltramadol and N-desmethyltramadol are expected    metabolites of tramadol. ==================================================================== Test                      Result    Flag   Units      Ref Range   Creatinine              173              mg/dL       >=20 ==================================================================== Declared Medications:  The flagging and interpretation on this report are based on the  following declared medications.  Unexpected results may arise from  inaccuracies in the declared medications.  **Note: The testing scope of this panel does not include following  reported medications:  Carvedilol (Coreg)  Fludrocortisone (Florinef)  Hydrocortisone (Cortef)  Levothyroxine (Synthroid)  Sildenafil (Viagra)  Testosterone (Depo-Testosterone)  Valsartan (Diovan) ==================================================================== For clinical consultation, please call [phone] ====================================================================    UDS interpretation: Compliant          Medication Assessment Form: Reviewed. Patient indicates being compliant with therapy Treatment compliance: Compliant Risk Assessment Profile: Aberrant behavior: See prior evaluations. None observed or detected today Comorbid factors increasing risk of overdose: See prior notes. No additional risks detected today Opioid risk tool (ORT) (Total Score):   Personal History of Substance Abuse (SUD-Substance use disorder):  Alcohol:    Illegal Drugs:    Rx Drugs:    ORT Risk Level calculation:   Risk of substance use disorder (SUD): Low  ORT Scoring interpretation table:  Score <3 = Low Risk for SUD  Score between 4-7 = Moderate Risk for SUD  Score >8 = High Risk for Opioid  Abuse   Risk Mitigation Strategies:  Patient Counseling: Covered Patient-Prescriber Agreement (PPA): Present and active  Notification to other healthcare providers: Done  Pharmacologic Plan: No change in therapy, at this time.             Post-Procedure Assessment  02/08/2018 procedure: Bilateral lumbar facet nerve block Pre-procedure pain score:  4/10 Post-procedure pain score: 1/10         Influential Factors: BMI: 37.88 kg/m Intra-procedural  challenges: None observed.         Assessment challenges: None detected.              Reported side-effects: None.        Post-procedural adverse reactions or complications: None reported         Sedation: Please see nurses note. When no sedatives are used, the analgesic levels obtained are directly associated to the effectiveness of the local anesthetics. However, when sedation is provided, the level of analgesia obtained during the initial 1 hour following the intervention, is believed to be the result of a combination of factors. These factors may include, but are not limited to: 1. The effectiveness of the local anesthetics used. 2. The effects of the analgesic(s) and/or anxiolytic(s) used. 3. The degree of discomfort experienced by the patient at the time of the procedure. 4. The patients ability and reliability in recalling and recording the events. 5. The presence and influence of possible secondary gains and/or psychosocial factors. Reported result: Relief experienced during the 1st hour after the procedure: 100 % (Ultra-Short Term Relief)            Interpretative annotation: Clinically appropriate result. Analgesia during this period is likely to be Local Anesthetic and/or IV Sedative (Analgesic/Anxiolytic) related.          Effects of local anesthetic: The analgesic effects attained during this period are directly associated to the localized infiltration of local anesthetics and therefore cary significant diagnostic value as to the etiological location, or anatomical origin, of the pain. Expected duration of relief is directly dependent on the pharmacodynamics of the local anesthetic used. Long-acting (4-6 hours) anesthetics used.  Reported result: Relief during the next 4 to 6 hour after the procedure: 100 %(for about 4 week) (Short-Term Relief)            Interpretative annotation: Clinically appropriate result. Analgesia during this period is likely to be Local Anesthetic-related.           Long-term benefit: Defined as the period of time past the expected duration of local anesthetics (1 hour for short-acting and 4-6 hours for long-acting). With the possible exception of prolonged sympathetic blockade from the local anesthetics, benefits during this period are typically attributed to, or associated with, other factors such as analgesic sensory neuropraxia, antiinflammatory effects, or beneficial biochemical changes provided by agents other than the local anesthetics.  Reported result: Extended relief following procedure: 60 % (Long-Term Relief)            Interpretative annotation: Clinically possible results. Good relief. No permanent benefit expected. Inflammation plays a part in the etiology to the pain.          Current benefits: Defined as reported results that persistent at this point in time.   Analgesia: >50 %            Function: Somewhat improved ROM: Somewhat improved Interpretative annotation: Recurrence of symptoms. No permanent benefit expected. Effective diagnostic intervention.          Interpretation: Results would  suggest a successful diagnostic intervention.                  Plan:  Please see "Plan of Care" for details.                Laboratory Chemistry  Inflammation Markers (CRP: Acute Phase) (ESR: Chronic Phase) No results found for: CRP, ESRSEDRATE, LATICACIDVEN                       Rheumatology Markers No results found for: RF, ANA, LABURIC, URICUR, LYMEIGGIGMAB, LYMEABIGMQN, HLAB27                      Renal Function Markers Lab Results  Component Value Date   BUN 14 03/15/2018   CREATININE 0.98 03/15/2018   BCR 11 07/23/2017   GFRAA >60 03/15/2018   GFRNONAA >60 03/15/2018                             Hepatic Function Markers Lab Results  Component Value Date   AST 30 03/15/2018   ALT 46 (H) 03/15/2018   ALBUMIN 4.8 03/15/2018   ALKPHOS 46 03/15/2018                        Electrolytes Lab Results  Component Value Date   NA  140 03/15/2018   K 4.4 03/15/2018   CL 105 03/15/2018   CALCIUM 9.2 03/15/2018   MG 2.1 12/19/2012                        Neuropathy Markers No results found for: VITAMINB12, FOLATE, HGBA1C, HIV                      CNS Tests No results found for: SDES, GRAMSTAIN, CULT, COLORCSF, APPEARCSF, RBCCOUNTCSF, WBCCSF, POLYSCSF, LYMPHSCSF, EOSCSF, PROTEINCSF, GLUCCSF, JCVIRUS, CSFOLI, IGGCSF, IGGALB, IGGIND                      Bone Pathology Markers No results found for: [place], [id number], [id number], [id number], 25OHVITD1, 25OHVITD2, 25OHVITD3, TESTOFREE, TESTOSTERONE                       Coagulation Parameters Lab Results  Component Value Date   PLT 250 03/15/2018                        Cardiovascular Markers Lab Results  Component Value Date   TROPONINI <0.03 12/04/2014   TROPONINI <0.03 12/04/2014   HGB 16.4 03/15/2018   HCT 47.4 03/15/2018                         CA Markers No results found for: CEA, CA125, LABCA2                      Note: Lab results reviewed.  Recent Diagnostic Imaging Results  DG C-Arm 1-60 Min-No Report Fluoroscopy was utilized by the requesting physician.  No radiographic  interpretation.   Complexity Note: Imaging results reviewed. Results shared with Mr. Heap, using Layman's terms.                         Meds   Current Outpatient Medications:  .  carvedilol (COREG) 6.25  MG tablet, TAKE 1 TABLET (6.25 MG TOTAL) BY MOUTH 2 (TWO) TIMES DAILY., Disp: 180 tablet, Rfl: 1 .  fludrocortisone (FLORINEF) 0.1 MG tablet, Take 0.1 mg by mouth daily as needed for dizziness., Disp: , Rfl:  .  hydrocortisone (CORTEF) 10 MG tablet, Take 10 mg by mouth daily. , Disp: , Rfl:  .  hydroxychloroquine (PLAQUENIL) 200 MG tablet, Take 1 tablet by mouth 2 (two) times daily., Disp: , Rfl: 3 .  levothyroxine (SYNTHROID, LEVOTHROID) 100 MCG tablet, Take 1 tablet by mouth daily., Disp: , Rfl: 3 .  predniSONE (DELTASONE) 5 MG tablet, Take 7.5 mg by mouth daily., Disp: ,  Rfl: 4 .  sildenafil (VIAGRA) 100 MG tablet, Take 1 tablet by mouth daily as needed., Disp: , Rfl: 12 .  testosterone cypionate (DEPOTESTOTERONE CYPIONATE) 200 MG/ML injection, Inject 100 mg into the muscle every 14 (fourteen) days. , Disp: , Rfl:  .  valsartan (DIOVAN) 320 MG tablet, TAKE 1 TABLET (320 MG TOTAL) BY MOUTH DAILY., Disp: 90 tablet, Rfl: 3 .  Vitamin D, Ergocalciferol, (DRISDOL) 50000 units CAPS capsule, Take 50,000 Units by mouth once a week., Disp: , Rfl: 2 .  methocarbamol (ROBAXIN) 500 MG tablet, Take 1 tablet (500 mg total) by mouth every 8 (eight) hours as needed for muscle spasms., Disp: 90 tablet, Rfl: 5 .  [START ON 05/14/2018] traMADol (ULTRAM) 50 MG tablet, Take 1-2 tablets (50-100 mg total) by mouth every 6 (six) hours as needed for severe pain., Disp: 240 tablet, Rfl: 5  ROS  Constitutional: Denies any fever or chills Gastrointestinal: No reported hemesis, hematochezia, vomiting, or acute GI distress Musculoskeletal: Denies any acute onset joint swelling, redness, loss of ROM, or weakness Neurological: No reported episodes of acute onset apraxia, aphasia, dysarthria, agnosia, amnesia, paralysis, loss of coordination, or loss of consciousness  Allergies  Mr. Boutwell is allergic to borax.  PFSH  Drug: Mr. Wiltgen  reports that he does not use drugs. Alcohol:  reports that he drinks alcohol. Tobacco:  reports that he quit smoking about 22 years ago. He quit smokeless tobacco use about 22 years ago. Medical:  has a past medical history of Adrenal insufficiency ([place]), Anxiety, Arthritis, Back pain, chronic (06/03/2015), Hypertension, Hypothyroid, Sleep apnea, and Thyroid disease. Surgical: Mr. Banke  has a past surgical history that includes Elbow surgery and Back surgery. Family: family history includes Heart attack (age of onset: 73) in his father; Hypertension in his father and sister; Osteoarthritis in his mother.  Constitutional Exam  General appearance: Well nourished,  well developed, and well hydrated. In no apparent acute distress Vitals:   04/25/18 0945  BP: 131/84  Pulse: 77  Temp: 98.5 F (36.9 C)  SpO2: 97%  Weight: 295 lb (133.8 kg)  Height: 6' 2"  (1.88 m)  Psych/Mental status: Alert, oriented x 3 (person, place, & time)       Eyes: PERLA Respiratory: No evidence of acute respiratory distress   Lumbar Spine Area Exam  Skin & Axial Inspection: No masses, redness, or swelling Alignment: Symmetrical Functional ROM: Unrestricted ROM       Stability: No instability detected Muscle Tone/Strength: Functionally intact. No obvious neuro-muscular anomalies detected. Sensory (Neurological): Unimpaired Palpation: Increased muscle tone       Provocative Tests: Hyperextension/rotation test: Positive bilaterally for facet joint pain.   Gait & Posture Assessment  Ambulation: Unassisted Gait: Relatively normal for age and body habitus Posture: WNL   Lower Extremity Exam    Side: Right lower extremity  Side:  Left lower extremity  Stability: No instability observed          Stability: No instability observed          Skin & Extremity Inspection: Skin color, temperature, and hair growth are WNL. No peripheral edema or cyanosis. No masses, redness, swelling, asymmetry, or associated skin lesions. No contractures.  Skin & Extremity Inspection: Skin color, temperature, and hair growth are WNL. No peripheral edema or cyanosis. No masses, redness, swelling, asymmetry, or associated skin lesions. No contractures.  Functional ROM: Unrestricted ROM                  Functional ROM: Unrestricted ROM                  Muscle Tone/Strength: Functionally intact. No obvious neuro-muscular anomalies detected.  Muscle Tone/Strength: Functionally intact. No obvious neuro-muscular anomalies detected.  Sensory (Neurological): Unimpaired  Sensory (Neurological): Unimpaired  Palpation: No palpable anomalies  Palpation: No palpable anomalies   Assessment  Primary Diagnosis  & Pertinent Problem List: The primary encounter diagnosis was Lumbar spondylosis. Diagnoses of Chronic sacroiliac joint pain (Bilateral) (R>L), Chronic pain syndrome, Long term current use of opiate analgesic, and Chronic musculoskeletal pain were also pertinent to this visit.  Status Diagnosis  Controlled Controlled Controlled 1. Lumbar spondylosis   2. Chronic sacroiliac joint pain (Bilateral) (R>L)   3. Chronic pain syndrome   4. Long term current use of opiate analgesic   5. Chronic musculoskeletal pain     Problems updated and reviewed during this visit: Problem  Chronic Musculoskeletal Pain   Plan of Care  Pharmacotherapy (Medications Ordered): Meds ordered this encounter  Medications  . traMADol (ULTRAM) 50 MG tablet    Sig: Take 1-2 tablets (50-100 mg total) by mouth every 6 (six) hours as needed for severe pain.    Dispense:  240 tablet    Refill:  5    Fill one day early if pharmacy is closed on scheduled refill date. Do not refill any sooner than every 30 days. Do not fill until:05/14/2018 To last until:11/10/2018    Order Specific Question:   Supervising Provider    Answer:   Milinda Pointer [phone]  . methocarbamol (ROBAXIN) 500 MG tablet    Sig: Take 1 tablet (500 mg total) by mouth every 8 (eight) hours as needed for muscle spasms.    Dispense:  90 tablet    Refill:  5    Do not place this medication, or any other prescription from our practice, on "Automatic Refill". Patient may have prescription filled one day early if pharmacy is closed on scheduled refill date.    Order Specific Question:   Supervising Provider    Answer:   Milinda Pointer [[id number]]   New Prescriptions   METHOCARBAMOL (ROBAXIN) 500 MG TABLET    Take 1 tablet (500 mg total) by mouth every 8 (eight) hours as needed for muscle spasms.   Medications administered today: [name] had no medications administered during this visit. Lab-work, procedure(s), and/or referral(s): Orders Placed  This Encounter  Procedures  . ToxASSURE Select 13 (MW), Urine   Imaging and/or referral(s): None  Interventional therapies: Planned, scheduled, and/or pending:  None at this time.    Considering:  Caudal epidural steroid injection  Lumbar epidural steroid injection  Rightlumbar facet block  Possible lumbar facet radiofrequency ablation.    Palliative PRN treatment(s):  Caudal epidural steroid injection Right L5-S1 lumbar epidural steroid injection  Palliative rightlumbar facet block  Right  lumbar facet RFA Right-sided sacroiliac joint block  Right-sided intra-articular hip joint injection Lumbar Trigger Point injections    Provider-requested follow-up: Return in about 6 months (around 10/24/2018) for MedMgmt with Me Donella Stade Edison Pace).  Future Appointments  Date Time Provider [place]  10/20/2018  8:30 AM Vevelyn Francois, NP [facility] None   Primary Care Physician: Patient, No Pcp Per Location: [facility] Facility Note by: Vevelyn Francois NP Date: 04/25/2018; Time: 1:15 PM  Pain Score Disclaimer: We use the NRS-11 scale. This is a self-reported, subjective measurement of pain severity with only modest accuracy. It is used primarily to identify changes within a particular patient. It must be understood that outpatient pain scales are significantly less accurate that those used for research, where they can be applied under ideal controlled circumstances with minimal exposure to variables. In reality, the score is likely to be a combination of pain intensity and pain affect, where pain affect describes the degree of emotional arousal or changes in action readiness caused by the sensory experience of pain. Factors such as social and work situation, setting, emotional state, anxiety levels, expectation, and prior pain experience may influence pain perception and show large inter-individual differences that may also be affected by time  variables.  Patient instructions provided during this appointment: Patient Instructions   ____________________________________________________________________________________________  Medication Rules  Applies to: All patients receiving prescriptions (written or electronic).  Pharmacy of record: Pharmacy where electronic prescriptions will be sent. If written prescriptions are taken to a different pharmacy, please inform the nursing staff. The pharmacy listed in the electronic medical record should be the one where you would like electronic prescriptions to be sent.  Prescription refills: Only during scheduled appointments. Applies to both, written and electronic prescriptions.  NOTE: The following applies primarily to controlled substances (Opioid* Pain Medications).   Patient's responsibilities: 1. Pain Pills: Bring all pain pills to every appointment (except for procedure appointments). 2. Pill Bottles: Bring pills in original pharmacy bottle. Always bring newest bottle. Bring bottle, even if empty. 3. Medication refills: You are responsible for knowing and keeping track of what medications you need refilled. The day before your appointment, write a list of all prescriptions that need to be refilled. Bring that list to your appointment and give it to the admitting nurse. Prescriptions will be written only during appointments. If you forget a medication, it will not be "Called in", "Faxed", or "electronically sent". You will need to get another appointment to get these prescribed. 4. Prescription Accuracy: You are responsible for carefully inspecting your prescriptions before leaving our office. Have the discharge nurse carefully go over each prescription with you, before taking them home. Make sure that your name is accurately spelled, that your address is correct. Check the name and dose of your medication to make sure it is accurate. Check the number of pills, and the written instructions  to make sure they are clear and accurate. Make sure that you are given enough medication to last until your next medication refill appointment. 5. Taking Medication: Take medication as prescribed. Never take more pills than instructed. Never take medication more frequently than prescribed. Taking less pills or less frequently is permitted and encouraged, when it comes to controlled substances (written prescriptions).  6. Inform other Doctors: Always inform, all of your healthcare providers, of all the medications you take. 7. Pain Medication from other Providers: You are not allowed to accept any additional pain medication from any other Doctor or Healthcare provider. There  are two exceptions to this rule. (see below) In the event that you require additional pain medication, you are responsible for notifying us, as stated below. 8. Medication Agreement: You are responsible for carefully reading and following our Medication Agreement. This must be signed before receiving any prescriptions from our practice. Safely store a copy of your signed Agreement. Violations to the Agreement will result in no further prescriptions. (Additional copies of our Medication Agreement are available upon request.) 9. Laws, Rules, & Regulations: All patients are expected to follow all Federal and [employer], [employer], Rules, [employer]. Ignorance of the Laws does not constitute a valid excuse. The use of any illegal substances is prohibited. 10. Adopted CDC guidelines & recommendations: Target dosing levels will be at or below 60 MME/day. Use of benzodiazepines** is not recommended.  Exceptions: There are only two exceptions to the rule of not receiving pain medications from other Healthcare Providers. 1. Exception #1 (Emergencies): In the event of an emergency (i.e.: accident requiring emergency care), you are allowed to receive additional pain medication. However, you are responsible for: As soon as you are able, call our  office [phone], at any time of the day or night, and leave a message stating your name, the date and nature of the emergency, and the name and dose of the medication prescribed. In the event that your call is answered by a member of our staff, make sure to document and save the date, time, and the name of the person that took your information.  2. Exception #2 (Planned Surgery): In the event that you are scheduled by another doctor or dentist to have any type of surgery or procedure, you are allowed (for a period no longer than 30 days), to receive additional pain medication, for the acute post-op pain. However, in this case, you are responsible for picking up a copy of our "Post-op Pain Management for Surgeons" handout, and giving it to your surgeon or dentist. This document is available at our office, and does not require an appointment to obtain it. Simply go to our office during business hours (Monday-Thursday from 8:00 AM to 4:00 PM) (Friday 8:00 AM to 12:00 Noon) or if you have a scheduled appointment with [place], prior to your surgery, and ask for it by name. In addition, you will need to provide [place] with your name, name of your surgeon, type of surgery, and date of procedure or surgery.  *Opioid medications include: morphine, codeine, oxycodone, oxymorphone, hydrocodone, hydromorphone, meperidine, tramadol, tapentadol, buprenorphine, fentanyl, methadone. **Benzodiazepine medications include: diazepam (Valium), alprazolam (Xanax), clonazepam (Klonopine), lorazepam (Ativan), clorazepate (Tranxene), chlordiazepoxide (Librium), estazolam (Prosom), oxazepam (Serax), temazepam (Restoril), triazolam (Halcion) (Last updated: 10/14/2017) ____________________________________________________________________________________________    BMI Assessment: Estimated body mass index is 37.88 kg/m as calculated from the following:   Height as of this encounter: 6' 2"  (1.88 m).   Weight as of this encounter: 295  lb (133.8 kg).  BMI interpretation table: BMI level Category Range association with higher incidence of chronic pain  <18 kg/m2 Underweight   18.5-24.9 kg/m2 Ideal body weight   25-29.9 kg/m2 Overweight Increased incidence by 20%  30-34.9 kg/m2 Obese (Class I) Increased incidence by 68%  35-39.9 kg/m2 Severe obesity (Class II) Increased incidence by 136%  >40 kg/m2 Extreme obesity (Class III) Increased incidence by 254%   Patient's current BMI Ideal Body weight  Body mass index is 37.88 kg/m. Ideal body weight: 82.2 kg (181 lb 3.5 oz) Adjusted ideal body weight: 102.8 kg (226 lb 11.7  oz)   BMI Readings from Last 4 Encounters:  04/25/18 37.88 kg/m  03/15/18 37.88 kg/m  02/08/18 38.39 kg/m  11/29/17 35.95 kg/m   Wt Readings from Last 4 Encounters:  04/25/18 295 lb (133.8 kg)  03/15/18 295 lb (133.8 kg)  02/08/18 299 lb (135.6 kg)  11/29/17 280 lb (127 kg)  You were given prescriptions for Tramadol and Methocarbamol today.

## 2018-04-25 NOTE — Patient Instructions (Addendum)
____________________________________________________________________________________________  Medication Rules  Applies to: All patients receiving prescriptions (written or electronic).  Pharmacy of record: Pharmacy where electronic prescriptions will be sent. If written prescriptions are taken to a different pharmacy, please inform the nursing staff. The pharmacy listed in the electronic medical record should be the one where you would like electronic prescriptions to be sent.  Prescription refills: Only during scheduled appointments. Applies to both, written and electronic prescriptions.  NOTE: The following applies primarily to controlled substances (Opioid* Pain Medications).   Patient's responsibilities: 1. Pain Pills: Bring all pain pills to every appointment (except for procedure appointments). 2. Pill Bottles: Bring pills in original pharmacy bottle. Always bring newest bottle. Bring bottle, even if empty. 3. Medication refills: You are responsible for knowing and keeping track of what medications you need refilled. The day before your appointment, write a list of all prescriptions that need to be refilled. Bring that list to your appointment and give it to the admitting nurse. Prescriptions will be written only during appointments. If you forget a medication, it will not be "Called in", "Faxed", or "electronically sent". You will need to get another appointment to get these prescribed. 4. Prescription Accuracy: You are responsible for carefully inspecting your prescriptions before leaving our office. Have the discharge nurse carefully go over each prescription with you, before taking them home. Make sure that your name is accurately spelled, that your address is correct. Check the name and dose of your medication to make sure it is accurate. Check the number of pills, and the written instructions to make sure they are clear and accurate. Make sure that you are given enough medication to last  until your next medication refill appointment. 5. Taking Medication: Take medication as prescribed. Never take more pills than instructed. Never take medication more frequently than prescribed. Taking less pills or less frequently is permitted and encouraged, when it comes to controlled substances (written prescriptions).  6. Inform other Doctors: Always inform, all of your healthcare providers, of all the medications you take. 7. Pain Medication from other Providers: You are not allowed to accept any additional pain medication from any other Doctor or Healthcare provider. There are two exceptions to this rule. (see below) In the event that you require additional pain medication, you are responsible for notifying us, as stated below. 8. Medication Agreement: You are responsible for carefully reading and following our Medication Agreement. This must be signed before receiving any prescriptions from our practice. Safely store a copy of your signed Agreement. Violations to the Agreement will result in no further prescriptions. (Additional copies of our Medication Agreement are available upon request.) 9. Laws, Rules, & Regulations: All patients are expected to follow all Federal and State Laws, Statutes, Rules, & Regulations. Ignorance of the Laws does not constitute a valid excuse. The use of any illegal substances is prohibited. 10. Adopted CDC guidelines & recommendations: Target dosing levels will be at or below 60 MME/day. Use of benzodiazepines** is not recommended.  Exceptions: There are only two exceptions to the rule of not receiving pain medications from other Healthcare Providers. 1. Exception #1 (Emergencies): In the event of an emergency (i.e.: accident requiring emergency care), you are allowed to receive additional pain medication. However, you are responsible for: As soon as you are able, call our office [phone], at any time of the day or night, and leave a message stating your name, the  date and nature of the emergency, and the name and dose of the medication   prescribed. In the event that your call is answered by a member of our staff, make sure to document and save the date, time, and the name of the person that took your information.  2. Exception #2 (Planned Surgery): In the event that you are scheduled by another doctor or dentist to have any type of surgery or procedure, you are allowed (for a period no longer than 30 days), to receive additional pain medication, for the acute post-op pain. However, in this case, you are responsible for picking up a copy of our "Post-op Pain Management for Surgeons" handout, and giving it to your surgeon or dentist. This document is available at our office, and does not require an appointment to obtain it. Simply go to our office during business hours (Monday-Thursday from 8:00 AM to 4:00 PM) (Friday 8:00 AM to 12:00 Noon) or if you have a scheduled appointment with [place], prior to your surgery, and ask for it by name. In addition, you will need to provide [place] with your name, name of your surgeon, type of surgery, and date of procedure or surgery.  *Opioid medications include: morphine, codeine, oxycodone, oxymorphone, hydrocodone, hydromorphone, meperidine, tramadol, tapentadol, buprenorphine, fentanyl, methadone. **Benzodiazepine medications include: diazepam (Valium), alprazolam (Xanax), clonazepam (Klonopine), lorazepam (Ativan), clorazepate (Tranxene), chlordiazepoxide (Librium), estazolam (Prosom), oxazepam (Serax), temazepam (Restoril), triazolam (Halcion) (Last updated: 10/14/2017) ____________________________________________________________________________________________    BMI Assessment: Estimated body mass index is 37.88 kg/m as calculated from the following:   Height as of this encounter: 6\' 2"  (1.88 m).   Weight as of this encounter: 295 lb (133.8 kg).  BMI interpretation table: BMI level Category Range association with higher  incidence of chronic pain  <18 kg/m2 Underweight   18.5-24.9 kg/m2 Ideal body weight   25-29.9 kg/m2 Overweight Increased incidence by 20%  30-34.9 kg/m2 Obese (Class I) Increased incidence by 68%  35-39.9 kg/m2 Severe obesity (Class II) Increased incidence by 136%  >40 kg/m2 Extreme obesity (Class III) Increased incidence by 254%   Patient's current BMI Ideal Body weight  Body mass index is 37.88 kg/m. Ideal body weight: 82.2 kg (181 lb 3.5 oz) Adjusted ideal body weight: 102.8 kg (226 lb 11.7 oz)   BMI Readings from Last 4 Encounters:  04/25/18 37.88 kg/m  03/15/18 37.88 kg/m  02/08/18 38.39 kg/m  11/29/17 35.95 kg/m   Wt Readings from Last 4 Encounters:  04/25/18 295 lb (133.8 kg)  03/15/18 295 lb (133.8 kg)  02/08/18 299 lb (135.6 kg)  11/29/17 280 lb (127 kg)  You were given prescriptions for Tramadol and Methocarbamol today.

## 2018-04-25 NOTE — Progress Notes (Signed)
Nursing Pain Medication Assessment:  Safety precautions to be maintained throughout the outpatient stay will include: orient to surroundings, keep bed in low position, maintain call bell within reach at all times, provide assistance with transfer out of bed and ambulation.  Medication Inspection Compliance: Pill count conducted under aseptic conditions, in front of the patient. Neither the pills nor the bottle was removed from the patient's sight at any time. Once count was completed pills were immediately returned to the patient in their original bottle.  Medication: Tramadol (Ultram) Pill/Patch Count: 184 of 240 pills remain Pill/Patch Appearance: Markings consistent with prescribed medication Bottle Appearance: Standard pharmacy container. Clearly labeled. Filled Date: 8 / 30 / 2019 Last Medication intake:  Today

## 2018-04-26 IMAGING — MR MR LUMBAR SPINE W/O CM
4 of 5 series · 25 of 48 positions shown · non-contrast
Comparison: 07/08/2012

CLINICAL DATA: Acute right S1 radiculopathy.

EXAM:
MRI LUMBAR SPINE WITHOUT CONTRAST
TECHNIQUE: Multiplanar, multisequence MR imaging of the lumbar spine was
performed. No intravenous contrast was administered.

[Series 2: T2 · sagittal · 4.0mm · 0.81mm/px · 5 of 15 slices shown (1 of 2)]
[im 1/15]
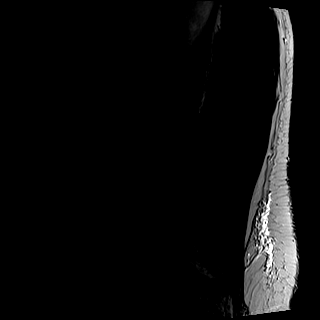
[im 4/15]
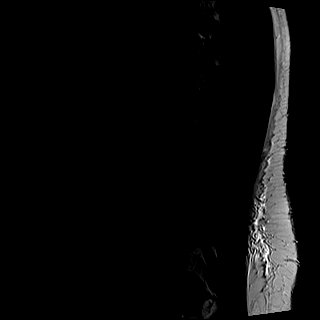
[im 8/15]
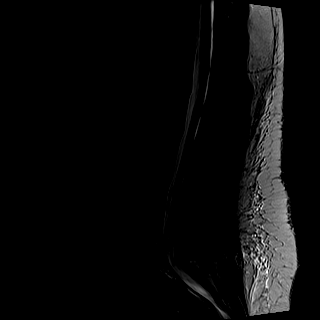
[im 11/15]
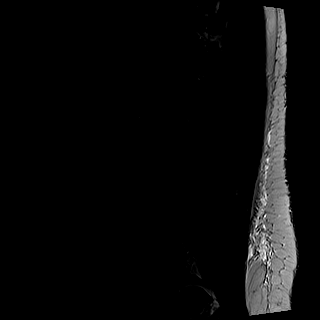
[im 15/15]
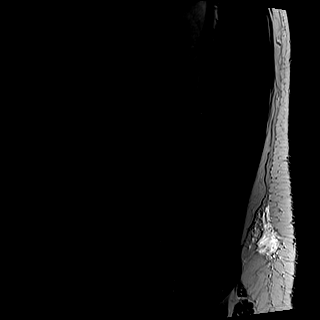

[Series 3: T1 · sagittal · 4.0mm · 0.41mm/px · 5 of 15 slices shown (1 of 2)]
[im 1/15]
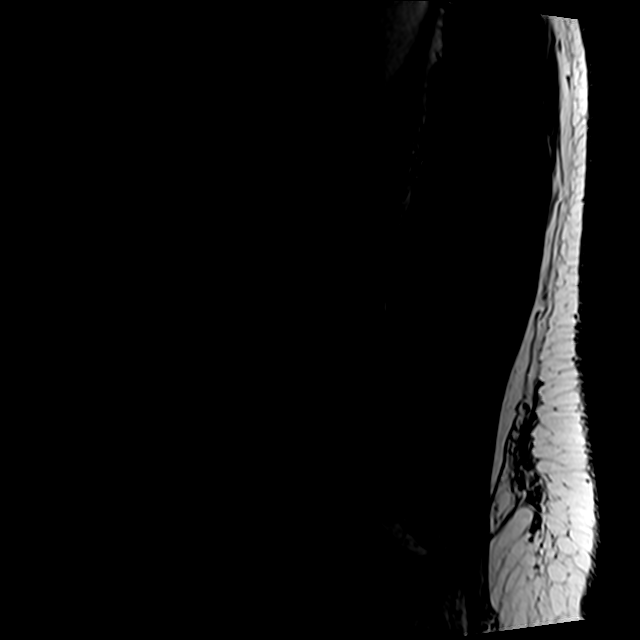
[im 4/15]
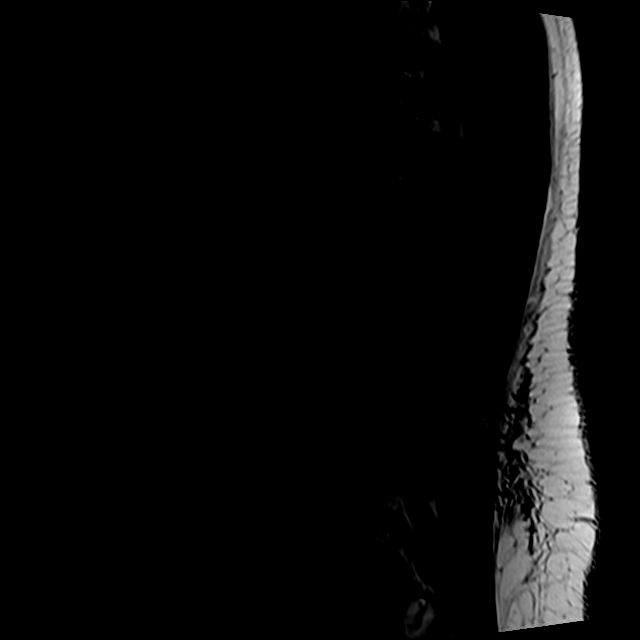
[im 8/15]
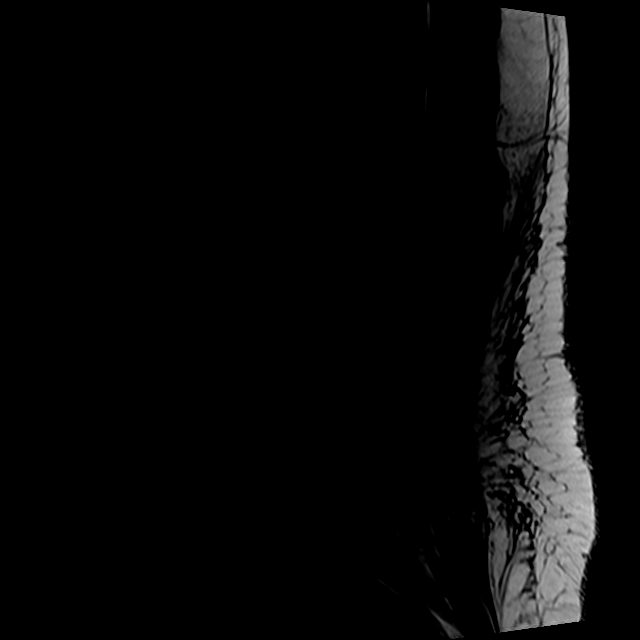
[im 11/15]
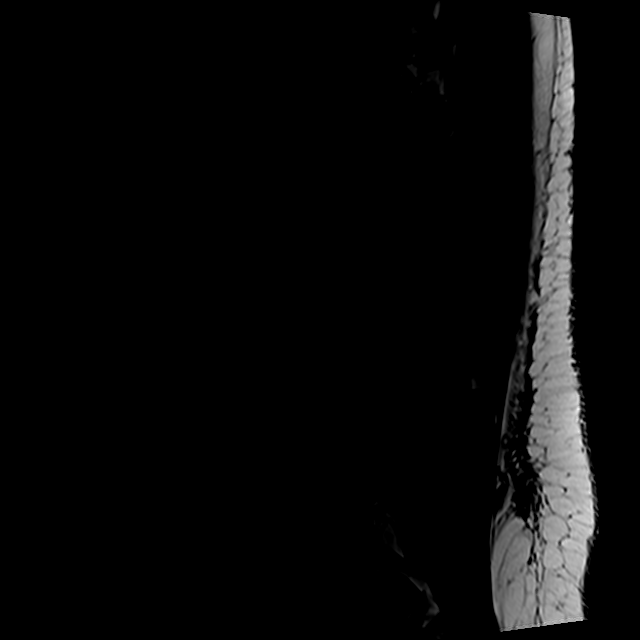
[im 15/15]
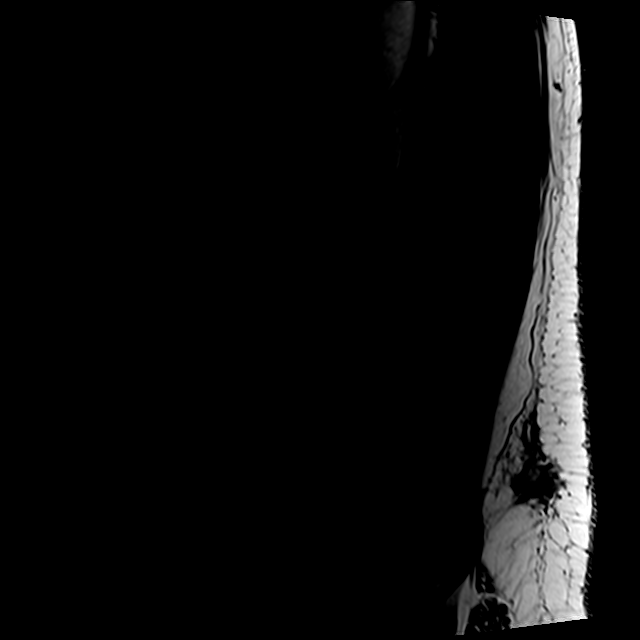

[Series 5: T2 · axial · 4.0mm · 0.78mm/px · z∈[-6,+216]mm · 10 of 42 slices shown (2 of 2)]
[im 3/42]
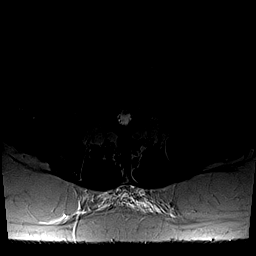
[im 6/42]
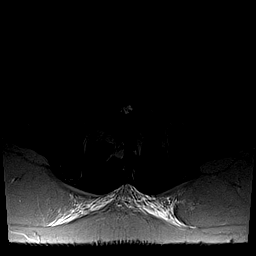
[im 9/42]
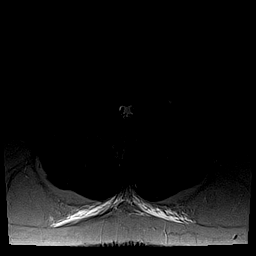
[im 14/42]
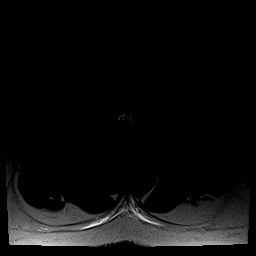
[im 20/42]
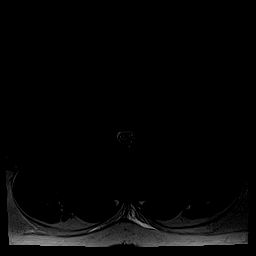
[im 22/42]
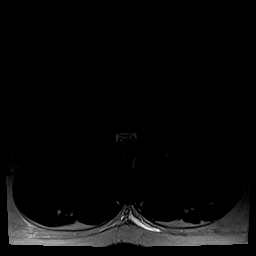
[im 25/42]
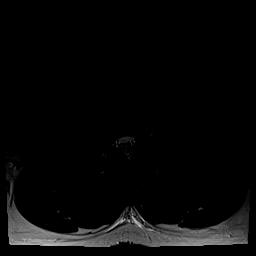
[im 31/42]
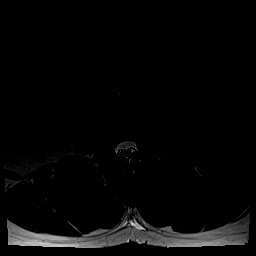
[im 36/42]
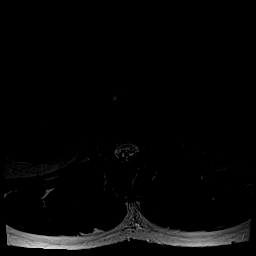
[im 42/42]
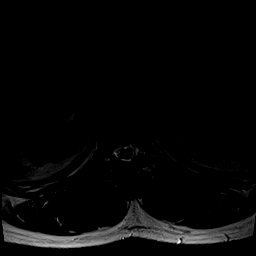

[Series 6: T1 · axial · 4.0mm · 0.31mm/px · z∈[-6,+186]mm · 5 of 42 slices shown (2 of 2)]
[im 3/42]
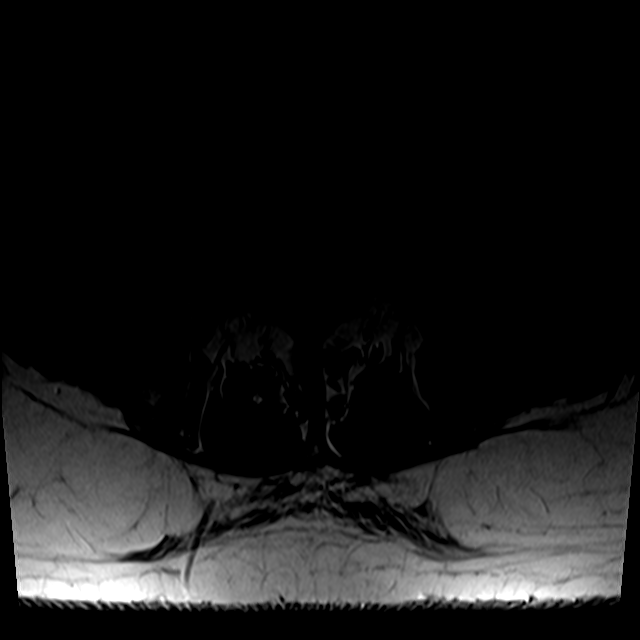
[im 6/42]
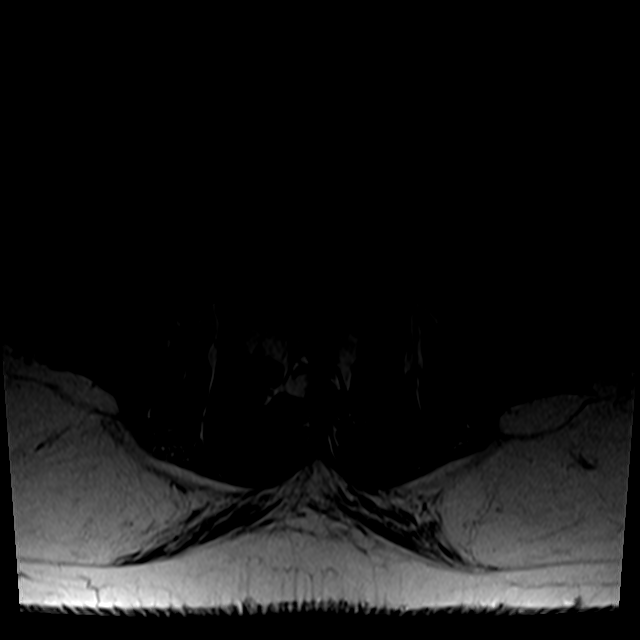
[im 9/42]
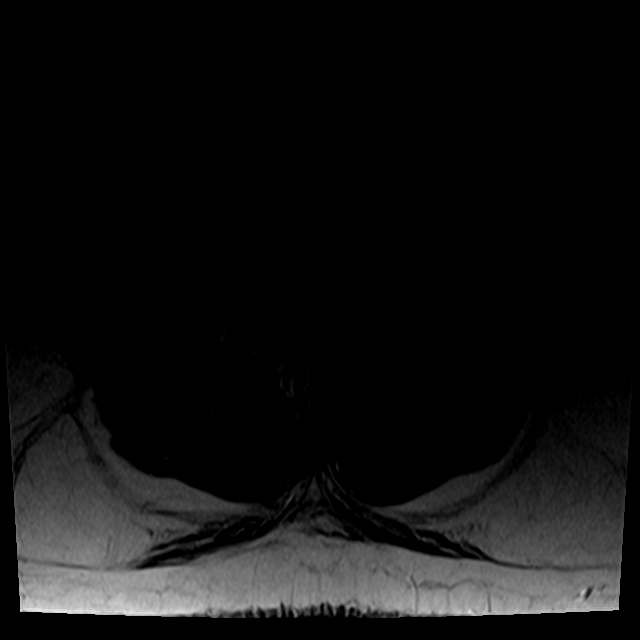
[im 22/42]
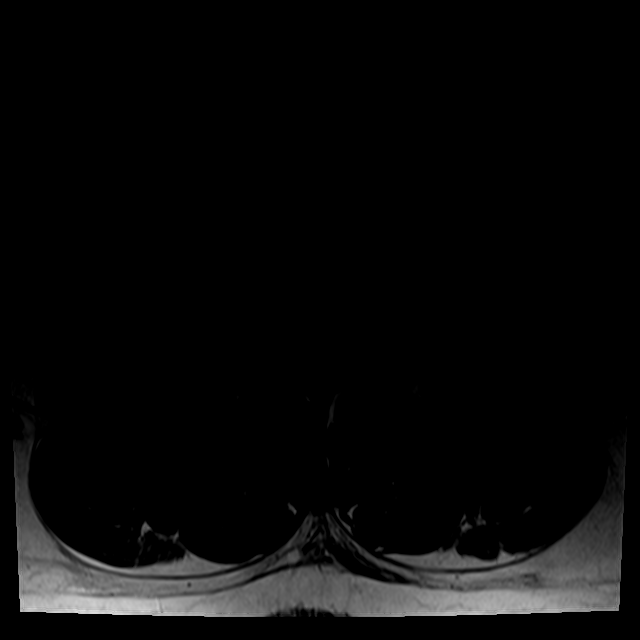
[im 36/42]
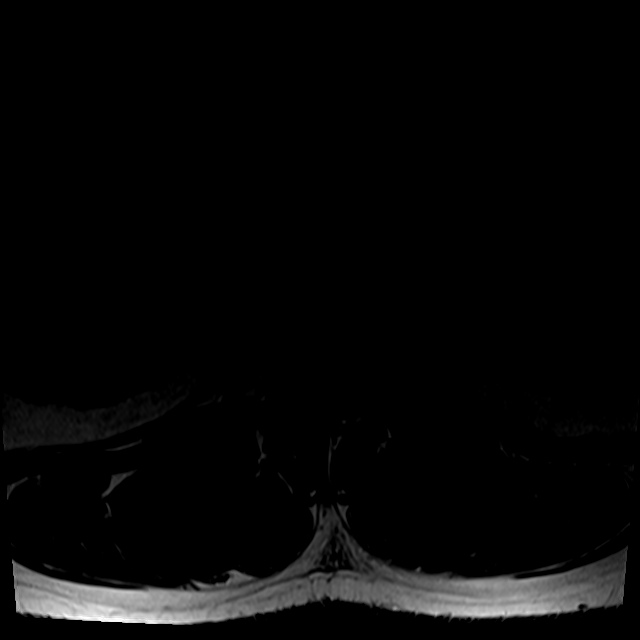

[25 of 48 positions shown; findings below may reference images not displayed]

FINDINGS: Segmentation:  Standard.

Alignment:  Slight L5-S1 retrolisthesis

Vertebrae:  No fracture, evidence of discitis, or bone lesion.

Conus medullaris: Extends to the L1 level and appears normal.

Paraspinal and other soft tissues: Small renal cysts.

Disc levels:

T12- L1: Unremarkable.

L1-L2: Unremarkable.

L2-L3: Unremarkable.

L3-L4: Unremarkable.

L4-L5: Small left foraminal protrusion with L4 contact but no
compression. Hyper trophic facet hypertrophy and disc bulging.

L5-S1:Right paracentral disc protrusion with S1 impingement. Patent
foramina.
IMPRESSION: 1. L5-S1 right paracentral protrusion with S1 impingement.
2. L4-5 left foraminal protrusion with L4 contact but no
compression.
3. L4-5 and L5-S1 facet arthropathy.

## 2018-04-29 LAB — TOXASSURE SELECT 13 (MW), URINE

## 2018-05-09 DIAGNOSIS — G4733 Obstructive sleep apnea (adult) (pediatric): Secondary | ICD-10-CM | POA: Diagnosis not present

## 2018-06-09 ENCOUNTER — Other Ambulatory Visit: Payer: Self-pay

## 2018-06-09 ENCOUNTER — Encounter: Payer: Self-pay | Admitting: Emergency Medicine

## 2018-06-09 ENCOUNTER — Other Ambulatory Visit: Payer: Self-pay | Admitting: Pain Medicine

## 2018-06-09 ENCOUNTER — Emergency Department
Admission: EM | Admit: 2018-06-09 | Discharge: 2018-06-09 | Disposition: A | Discharge status: Home / Self Care | Payer: 59 | Attending: Student in an Organized Health Care Education/Training Program | Admitting: Student in an Organized Health Care Education/Training Program

## 2018-06-09 ENCOUNTER — Telehealth: Payer: Self-pay | Admitting: *Deleted

## 2018-06-09 DIAGNOSIS — I10 Essential (primary) hypertension: Secondary | ICD-10-CM | POA: Insufficient documentation

## 2018-06-09 DIAGNOSIS — M5441 Lumbago with sciatica, right side: Secondary | ICD-10-CM | POA: Insufficient documentation

## 2018-06-09 DIAGNOSIS — R748 Abnormal levels of other serum enzymes: Secondary | ICD-10-CM | POA: Diagnosis not present

## 2018-06-09 DIAGNOSIS — Z87891 Personal history of nicotine dependence: Secondary | ICD-10-CM | POA: Insufficient documentation

## 2018-06-09 DIAGNOSIS — Z79899 Other long term (current) drug therapy: Secondary | ICD-10-CM | POA: Diagnosis not present

## 2018-06-09 DIAGNOSIS — M5416 Radiculopathy, lumbar region: Secondary | ICD-10-CM

## 2018-06-09 DIAGNOSIS — M545 Low back pain: Secondary | ICD-10-CM | POA: Diagnosis present

## 2018-06-09 DIAGNOSIS — E039 Hypothyroidism, unspecified: Secondary | ICD-10-CM | POA: Insufficient documentation

## 2018-06-09 DIAGNOSIS — R7989 Other specified abnormal findings of blood chemistry: Secondary | ICD-10-CM | POA: Diagnosis not present

## 2018-06-09 LAB — BASIC METABOLIC PANEL
Anion gap: 9 (ref 5–15)
BUN: 15 mg/dL (ref 6–20)
CHLORIDE: 106 mmol/L (ref 98–111)
CO2: 26 mmol/L (ref 22–32)
CREATININE: 1.62 mg/dL — AB (ref 0.61–1.24)
Calcium: 9.2 mg/dL (ref 8.9–10.3)
GFR calc Af Amer: 59 mL/min — ABNORMAL LOW (ref >60)
GFR, EST NON AFRICAN AMERICAN: 50 mL/min — AB (ref >60)
GLUCOSE: 101 mg/dL — AB (ref 70–99)
POTASSIUM: 4.2 mmol/L (ref 3.5–5.1)
SODIUM: 141 mmol/L (ref 135–145)

## 2018-06-09 LAB — CBC
HCT: 44.6 % (ref 39.0–52.0)
Hemoglobin: 15.5 g/dL (ref 13.0–17.0)
MCH: 29.9 pg (ref 26.0–34.0)
MCHC: 34.8 g/dL (ref 30.0–36.0)
MCV: 86.1 fL (ref 80.0–100.0)
Platelets: 297 10*3/uL (ref 150–400)
RBC: 5.18 MIL/uL (ref 4.22–5.81)
RDW: 12.4 % (ref 11.5–15.5)
WBC: 6.6 10*3/uL (ref 4.0–10.5)
nRBC: 0 % (ref 0.0–0.2)

## 2018-06-09 MED ORDER — PREDNISONE 20 MG PO TABS: ORAL_TABLET | ORAL | 0 refills | Status: DC

## 2018-06-09 MED ORDER — SODIUM CHLORIDE 0.9 % IV BOLUS
1000.0000 mL | Freq: Once | INTRAVENOUS | Status: AC
  Administered 2018-06-09: 1000 mL via INTRAVENOUS

## 2018-06-09 MED ORDER — OXYCODONE-ACETAMINOPHEN 5-325 MG PO TABS: 1.0000 | ORAL_TABLET | ORAL | 0 refills | Status: DC | PRN

## 2018-06-09 MED ORDER — PREDNISONE 10 MG PO TABS: ORAL_TABLET | ORAL | 0 refills | Status: DC

## 2018-06-09 MED ORDER — METHYLPREDNISOLONE SODIUM SUCC 125 MG IJ SOLR
80.0000 mg | Freq: Once | INTRAMUSCULAR | Status: AC
  Administered 2018-06-09: 80 mg via INTRAVENOUS
  Filled 2018-06-09: qty 2

## 2018-06-09 MED ORDER — OXYCODONE-ACETAMINOPHEN 5-325 MG PO TABS
1.0000 | ORAL_TABLET | Freq: Once | ORAL | Status: AC
  Administered 2018-06-09: 1 via ORAL
  Filled 2018-06-09: qty 1

## 2018-06-09 NOTE — Discharge Instructions (Addendum)
Please make an appointment as soon as possible with primary care for repeat labs and evaluation of your medications and kidney function.  Increase your fluid intake for your kidney function.  Make an appointment with neurosurgery for further evaluation of your back.  Return to the emergency department for fever, abdominal pain, weakness, any trouble with your bowels or bladder.

## 2018-06-09 NOTE — ED Triage Notes (Addendum)
Pt arrived via POV with reports of right lower back pain radiating down to right foot. Pt c/o right foot numbness and right calf numbness. Pt states the pain and numbness started this morning. Pt states when the pain started, the numbness started in his right lower leg. Pt states he woke up with stiffness in his right buttocks.  Pt has hx of ruptured disc between L5-L6.  Pt goes to pain management.   Pt takes tramadol as prescribed-last took 100mg  of Tramadol around 1500.   Denies any loss of bowel or bladder.

## 2018-06-09 NOTE — Progress Notes (Unsigned)
The patient calls indicating that he started having some pain going down to the bottom of his foot.  This is not the first time that he complains about this.  We have a diagnosis of acute right-sided lumbar radiculopathy at the S1 level with pain going down to the foot, which dates back to 05/26/2016.  Today he calls complaining of exactly the same pain.  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  Since the pain seems to be acute, I have recommended that he go to the emergency room to be evaluated, and if necessary an MRI ordered.  His last MRI was on 10/22/2016 and was ordered by Dr. Trenton Gammon.  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  At the time this was causing significant mass effect on the RIGHT S1 greater than RIGHT L5 nerve roots.

## 2018-06-09 NOTE — ED Notes (Signed)
See triage note  Presents with lower back pain and right leg  And also is having numbness to right foot   Hx of back surgeries in past  States pain started this am  And has gotten worse

## 2018-06-09 NOTE — ED Notes (Signed)
Discussed with flex providers, pt able to be seen in flex.

## 2018-06-09 NOTE — ED Provider Notes (Signed)
[facility] Emergency Department Provider Note  ____________________________________________  Time seen: Approximately 6:39 PM  I have reviewed the triage vital signs and the nursing notes.   HISTORY  Chief Complaint Back Pain    HPI Dean Ford is a 42 y.o. male presents emergency department for evaluation of right low back pain that radiates into right leg since this morning.  Patient states that he woke up and felt pain that shot down his right leg.  He has had on and off numbness to the back of his calf and top of his right foot today.  No injury or trauma.  He has had a discectomy 2 years ago and felt similar symptoms then.  He has not had any difficulties with his back until today.  He denies any weakness of the right leg.  He can move all of his toes.  He called Dr. Alver Fisher, his surgeon, today and was told that they could write him a prescription for steroids.  He takes 7.5 mg of prednisone daily for adrenal insufficiency.  He states that he is not good about taking his medications and does not take them every day.  He was recently started on Plaquenil for rheumatoid arthritis.  He has had not had any other medications changed recently.  He drank 1 bottle of water and one red bull today.  No bowel or bladder dysfunction or saddle anesthesias.  No fever, chills, nausea, vomiting, abdominal pain, urinary symptoms.   Past Medical History:  Diagnosis Date  . Adrenal insufficiency ([place])   . Anxiety   . Arthritis   . Back pain, chronic 06/03/2015  . Hypertension   . Hypothyroid   . Sleep apnea   . Thyroid disease     Patient Active Problem List   Diagnosis Date Noted  . Chronic musculoskeletal pain 04/25/2018  . Spondylosis without myelopathy or radiculopathy, lumbosacral region 02/08/2018  . Chronic lower extremity pain (Secondary Area of Pain) (Bilateral) (R>L) 02/08/2018  . Acute myofascial pain 05/04/2017  . 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 Acute lumbar radiculopathy (Right) (S1) 05/26/2016  . Spasm of paraspinal muscle 01/22/2016  . Lower extremity numbness (Bilateral) (R>L) 01/22/2016  . Chronic lumbar radicular pain (Right:S1, Bilateral:L5) (Bilateral) (R>L) 01/22/2016  . Chronic hip pain (Right) 01/22/2016  . Osteoarthritis of hip (Right) 01/22/2016  . Chronic low back pain (Primary Source of Pain) (R>L) 12/18/2015  . Erectile dysfunction 12/18/2015  . Chronic pain 09/02/2015  . Long term prescription opiate use 09/02/2015  . Encounter for chronic pain management 09/02/2015  . Chronic sacroiliac joint pain (Bilateral) (R>L) 06/11/2015  . Lumbar facet hypertrophy 06/11/2015  . Lumbosacral radiculopathy at S1 (right-sided) 06/11/2015  . Encounter for therapeutic drug level monitoring 06/03/2015  . Long term current use of opiate analgesic 06/03/2015  . Uncomplicated opioid dependence ([place]) 06/03/2015  . Opiate use 06/03/2015  . Lumbar spondylosis 06/03/2015  . Lumbar facet syndrome (Bilateral) (R>L) 06/03/2015  . Insomnia, persistent 06/03/2015  . Intermittent muscle cramps 06/03/2015  . Always thirsty 06/03/2015  . Avitaminosis D 06/03/2015  . Lumbar spine pain 06/03/2015  . Hip arthrosis 06/03/2015  . Obesity 06/03/2015  . DDD (degenerative disc disease), lumbar 06/03/2015  . L4-L5 disc bulge 06/03/2015  . Ligamentum flavum hypertrophy ([place]) 06/03/2015  . Bulging lumbar disc 06/03/2015  . Sleep apnea 06/03/2015  . Dyslipidemia 03/01/2015  . Anxiety disorder 01/28/2015  . Dehydration symptoms 01/28/2015  . Essential hypertension 07/20/2014  .  Chest discomfort 07/20/2014  . Hypothyroidism 07/20/2014  . Adrenal failure ([place]) 07/20/2014    Past Surgical History:  Procedure Laterality Date  . BACK SURGERY    . ELBOW SURGERY     right elbow    Prior to Admission medications   Medication Sig Start Date End Date Taking? Authorizing Provider  carvedilol  (COREG) 6.25 MG tablet TAKE 1 TABLET (6.25 MG TOTAL) BY MOUTH 2 (TWO) TIMES DAILY. 02/09/18   Nahser, Wonda Cheng, MD  fludrocortisone (FLORINEF) 0.1 MG tablet Take 0.1 mg by mouth daily as needed for dizziness.    [provider]  hydrocortisone (CORTEF) 10 MG tablet Take 10 mg by mouth daily.     [provider]  hydroxychloroquine (PLAQUENIL) 200 MG tablet Take 1 tablet by mouth 2 (two) times daily. 02/15/18   [provider]  levothyroxine (SYNTHROID, LEVOTHROID) 100 MCG tablet Take 1 tablet by mouth daily. 02/09/18   [provider]  methocarbamol (ROBAXIN) 500 MG tablet Take 1 tablet (500 mg total) by mouth every 8 (eight) hours as needed for muscle spasms. 04/25/18 10/22/18  Vevelyn Francois, NP  oxyCODONE-acetaminophen (PERCOCET) 5-325 MG tablet Take 1 tablet by mouth every 4 (four) hours as needed for severe pain. 06/09/18 06/09/19  Laban Emperor, PA-C  predniSONE (DELTASONE) 10 MG tablet Take 6 tablets on day 1, take 5 tablets on day 2, take 4 tablets on day 3, take 3 tablets on day 4, take 2 tablets on day 5, take 1 tablet on day 6 06/09/18   Laban Emperor, PA-C  sildenafil (VIAGRA) 100 MG tablet Take 1 tablet by mouth daily as needed. 02/09/18   [provider]  testosterone cypionate (DEPOTESTOTERONE CYPIONATE) 200 MG/ML injection Inject 100 mg into the muscle every 14 (fourteen) days.     [provider]  traMADol (ULTRAM) 50 MG tablet Take 1-2 tablets (50-100 mg total) by mouth every 6 (six) hours as needed for severe pain. 05/14/18 11/10/18  Vevelyn Francois, NP  valsartan (DIOVAN) 320 MG tablet TAKE 1 TABLET (320 MG TOTAL) BY MOUTH DAILY. 09/01/17   Nahser, Wonda Cheng, MD  Vitamin D, Ergocalciferol, (DRISDOL) 50000 units CAPS capsule Take 50,000 Units by mouth once a week. 01/06/18   [provider]    Allergies Borax  Family History  Problem Relation Age of Onset  . Osteoarthritis Mother   . Heart attack Father 82  . Hypertension  Father   . Hypertension Sister     Social History Social History   Tobacco Use  . Smoking status: Former Smoker    Last attempt to quit: 11/30/1995    Years since quitting: 22.5  . Smokeless tobacco: Former Systems developer    Quit date: 11/30/1995  Substance Use Topics  . Alcohol use: Yes    Alcohol/week: 0.0 standard drinks    Comment: rarely  . Drug use: No     Review of Systems  Constitutional: No fever/chills Cardiovascular: No chest pain. Respiratory: No SOB. Gastrointestinal: No abdominal pain.  No nausea, no vomiting.  Musculoskeletal: Positive for back pain. Skin: Negative for rash, abrasions, lacerations, ecchymosis. Neurological: Negative for headaches, tingling   ____________________________________________   PHYSICAL EXAM:  VITAL SIGNS: ED Triage Vitals  Enc Vitals Group     BP 06/09/18 1636 (!) 159/83     Pulse Rate 06/09/18 1636 80     Resp 06/09/18 1636 20     Temp 06/09/18 1636 98.4 F (36.9 C)     Temp Source 06/09/18  1636 Oral     SpO2 06/09/18 1636 98 %     Weight 06/09/18 1637 291 lb (132 kg)     Height 06/09/18 1637 6\' 2"  (1.88 m)     Head Circumference --      Peak Flow --      Pain Score --      Pain Loc --      Pain Edu? --      Excl. in [place]? --      Constitutional: Alert and oriented. Well appearing and in no acute distress. Eyes: Conjunctivae are normal. PERRL. EOMI. Head: Atraumatic. ENT:      Ears:      Nose: No congestion/rhinnorhea.      Mouth/Throat: Mucous membranes are moist.  Neck: No stridor.   Cardiovascular: Normal rate, regular rhythm.  Good peripheral circulation. Respiratory: Normal respiratory effort without tachypnea or retractions. Lungs CTAB. Good air entry to the bases with no decreased or absent breath sounds. Gastrointestinal: Bowel sounds 4 quadrants. Soft and nontender to palpation. No guarding or rigidity. No palpable masses. No distention.  Musculoskeletal: Full range of motion to all extremities. No gross  deformities appreciated.  No tenderness to palpation over lumbar spine.  Strength equal in lower extremities bilaterally.  Subjective numbness to plantar right foot and calf. Normal gait. No foot drop.  Neurologic:  Normal speech and language. No gross focal neurologic deficits are appreciated.  Skin:  Skin is warm, dry and intact. No rash noted. Psychiatric: Mood and affect are normal. Speech and behavior are normal. Patient exhibits appropriate insight and judgement.   ____________________________________________   LABS (all labs ordered are listed, but only abnormal results are displayed)  Labs Reviewed  BASIC METABOLIC PANEL - Abnormal; Notable for the following components:      Result Value   Glucose, Bld 101 (*)    Creatinine, Ser 1.62 (*)    GFR calc non Af Amer 50 (*)    GFR calc Af Amer 59 (*)    All other components within normal limits  CBC   ____________________________________________  EKG   ____________________________________________  RADIOLOGY   No results found.  ____________________________________________    PROCEDURES  Procedure(s) performed:    Procedures    Medications  sodium chloride 0.9 % bolus 1,000 mL (0 mLs Intravenous Stopped 06/09/18 2048)  methylPREDNISolone sodium succinate (SOLU-MEDROL) 125 mg/2 mL injection 80 mg (80 mg Intravenous Given 06/09/18 2014)  oxyCODONE-acetaminophen (PERCOCET/ROXICET) 5-325 MG per tablet 1 tablet (1 tablet Oral Given 06/09/18 2014)     ____________________________________________   INITIAL IMPRESSION / ASSESSMENT AND PLAN / ED COURSE  Pertinent labs & imaging results that were available during my care of the patient were reviewed by me and considered in my medical decision making (see chart for details).  Review of the [place] CSRS was performed in accordance of the [place] prior to dispensing any controlled drugs.   Patient's diagnosis is consistent with lumbar radiculopathy. Blood work was ordered  in triage for possible MRI.  Creatinine clearance is elevated at 1.62  and GFR is 50. Dr. Quentin Cornwall was consulted about creatinine and GFR and recommends that patient increase fluids and discharged home with follow-up with neuro for outpatient MRI.  Patient was given IV fluids, IV solumedrol and oral percocet.  Patient drank a small bottle of water today and had a redbull.   Patient will call Dr. Alver Fisher tomorrow for outpatient MRI.  Patient will be discharged home with prescriptions for prednisone and a short  course of Percocet. Patient is to follow up with primary care and neuro as directed.  Primary care resources were given patient is given ED precautions to return to the ED for any worsening or new symptoms.     ____________________________________________  FINAL CLINICAL IMPRESSION(S) / ED DIAGNOSES  Final diagnoses:  Acute right-sided low back pain with right-sided sciatica  Lumbar radiculopathy  Increased creatine kinase level      NEW MEDICATIONS STARTED DURING THIS VISIT:  ED Discharge Orders         Ordered    predniSONE (DELTASONE) 10 MG tablet     06/09/18 1943    oxyCODONE-acetaminophen (PERCOCET) 5-325 MG tablet  Every 4 hours PRN     06/09/18 1943              This chart was dictated using voice recognition software/Dragon. Despite best efforts to proofread, errors can occur which can change the meaning. Any change was purely unintentional.    Laban Emperor, PA-C 06/09/18 2347    Merlyn Lot, MD 06/10/18 [phone]

## 2018-06-09 NOTE — Telephone Encounter (Signed)
Spoke with patient.  He is having pain in the right buttock and down the right leg.  States his foot is going numb and the calf is going numb.  States he is very uncomfortabel.  Dr Dossie Arbour notified.  Steroid pack oprdered.  Patient questioned whether or not he should take steroid since he is taking steroid for his adrenals.  Spoke with Dr Dossie Arbour and he states no, not to take the steroid that he just ordered.  Patient notified.  Patient states that he is going to the ED and Dr Dossie Arbour notified.

## 2018-06-10 ENCOUNTER — Ambulatory Visit
Admission: RE | Admit: 2018-06-10 | Discharge: 2018-06-10 | Disposition: A | Discharge status: Home / Self Care | Payer: 59 | Source: Ambulatory Visit | Attending: Family Medicine | Admitting: Family Medicine

## 2018-06-10 ENCOUNTER — Other Ambulatory Visit: Payer: Self-pay

## 2018-06-10 ENCOUNTER — Ambulatory Visit
Admission: EM | Admit: 2018-06-10 | Discharge: 2018-06-10 | Disposition: A | Discharge status: Home / Self Care | Payer: 59 | Attending: Family Medicine | Admitting: Family Medicine

## 2018-06-10 ENCOUNTER — Telehealth: Payer: Self-pay | Admitting: Pain Medicine

## 2018-06-10 DIAGNOSIS — M549 Dorsalgia, unspecified: Secondary | ICD-10-CM | POA: Diagnosis present

## 2018-06-10 DIAGNOSIS — M5116 Intervertebral disc disorders with radiculopathy, lumbar region: Secondary | ICD-10-CM | POA: Diagnosis not present

## 2018-06-10 DIAGNOSIS — M48061 Spinal stenosis, lumbar region without neurogenic claudication: Secondary | ICD-10-CM | POA: Insufficient documentation

## 2018-06-10 DIAGNOSIS — M5136 Other intervertebral disc degeneration, lumbar region: Secondary | ICD-10-CM | POA: Insufficient documentation

## 2018-06-10 DIAGNOSIS — M5127 Other intervertebral disc displacement, lumbosacral region: Secondary | ICD-10-CM | POA: Diagnosis not present

## 2018-06-10 DIAGNOSIS — M5416 Radiculopathy, lumbar region: Secondary | ICD-10-CM

## 2018-06-10 LAB — BASIC METABOLIC PANEL
Anion gap: 12 (ref 5–15)
BUN: 15 mg/dL (ref 6–20)
CALCIUM: 9.5 mg/dL (ref 8.9–10.3)
CO2: 21 mmol/L — ABNORMAL LOW (ref 22–32)
Chloride: 105 mmol/L (ref 98–111)
Creatinine, Ser: 1.01 mg/dL (ref 0.61–1.24)
GFR calc Af Amer: >60 mL/min (ref >60)
GLUCOSE: 203 mg/dL — AB (ref 70–99)
POTASSIUM: 3.9 mmol/L (ref 3.5–5.1)
Sodium: 138 mmol/L (ref 135–145)

## 2018-06-10 MED ORDER — GADOBUTROL 1 MMOL/ML IV SOLN
10.0000 mL | Freq: Once | INTRAVENOUS | Status: AC | PRN
  Administered 2018-06-10: 10 mL via INTRAVENOUS

## 2018-06-10 NOTE — Telephone Encounter (Signed)
Patient went to ED and could not have MRI due to high creatinine. He was told he would to get this under control and MRI ordered thru outpatient. Wants Dr. Dossie Arbour to order MRI and manage creatinine level to get it down. Please notify patient when this can be done.

## 2018-06-10 NOTE — ED Provider Notes (Signed)
[facility]    CSN: [id number] Arrival date & time: 06/10/18  0944   History   Chief Complaint Chief Complaint  Patient presents with  . Back Pain   HPI  42 year old obese male with a long-standing history of chronic pain presents with radicular symptoms.  Patient was seen in the ER yesterday.  Patient diagnosed with lumbar radiculopathy.  Patient found to have elevated creatinine and MRI was not done as a result.  Patient presents today requesting recheck in his lab work and MRI.  Patient was unable to see his primary care physician today.  His pain management physician is out of the office.  Patient reports that he continues to have numbness in his right leg.  Essentially the entire right leg.  He denies back pain at this time.  He was treated in the ER with prednisone and pain medication.  No other reported symptoms at this time.  No other complaints.  Denies saddle anesthesia and incontinence.  History reviewed as below. Past Medical History:  Diagnosis Date  . Adrenal insufficiency ([place])   . Anxiety   . Arthritis   . Back pain, chronic 06/03/2015  . Hypertension   . Hypothyroid   . Sleep apnea   . Thyroid disease     Patient Active Problem List   Diagnosis Date Noted  . Chronic musculoskeletal pain 04/25/2018  . Spondylosis without myelopathy or radiculopathy, lumbosacral region 02/08/2018  . Chronic lower extremity pain (Secondary Area of Pain) (Bilateral) (R>L) 02/08/2018  . Acute myofascial pain 05/04/2017  . 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 Acute lumbar radiculopathy (Right) (S1) 05/26/2016  . Spasm of paraspinal muscle 01/22/2016  . Lower extremity numbness (Bilateral) (R>L) 01/22/2016  . Chronic lumbar radicular pain (Right:S1, Bilateral:L5) (Bilateral) (R>L) 01/22/2016  . Chronic hip pain (Right) 01/22/2016  . Osteoarthritis of hip (Right) 01/22/2016  . Chronic low back pain (Primary  Source of Pain) (R>L) 12/18/2015  . Erectile dysfunction 12/18/2015  . Chronic pain 09/02/2015  . Long term prescription opiate use 09/02/2015  . Encounter for chronic pain management 09/02/2015  . Chronic sacroiliac joint pain (Bilateral) (R>L) 06/11/2015  . Lumbar facet hypertrophy 06/11/2015  . Lumbosacral radiculopathy at S1 (right-sided) 06/11/2015  . Encounter for therapeutic drug level monitoring 06/03/2015  . Long term current use of opiate analgesic 06/03/2015  . Uncomplicated opioid dependence ([place]) 06/03/2015  . Opiate use 06/03/2015  . Lumbar spondylosis 06/03/2015  . Lumbar facet syndrome (Bilateral) (R>L) 06/03/2015  . Insomnia, persistent 06/03/2015  . Intermittent muscle cramps 06/03/2015  . Always thirsty 06/03/2015  . Avitaminosis D 06/03/2015  . Lumbar spine pain 06/03/2015  . Hip arthrosis 06/03/2015  . Obesity 06/03/2015  . DDD (degenerative disc disease), lumbar 06/03/2015  . L4-L5 disc bulge 06/03/2015  . Ligamentum flavum hypertrophy ([place]) 06/03/2015  . Bulging lumbar disc 06/03/2015  . Sleep apnea 06/03/2015  . Dyslipidemia 03/01/2015  . Anxiety disorder 01/28/2015  . Dehydration symptoms 01/28/2015  . Essential hypertension 07/20/2014  . Chest discomfort 07/20/2014  . Hypothyroidism 07/20/2014  . Adrenal failure ([place]) 07/20/2014   Past Surgical History:  Procedure Laterality Date  . BACK SURGERY    . ELBOW SURGERY     right elbow   Home Medications    Prior to Admission medications   Medication Sig Start Date End Date Taking? Authorizing Provider  carvedilol (COREG) 6.25 MG tablet TAKE 1 TABLET (6.25 MG TOTAL) BY MOUTH 2 (TWO) TIMES DAILY. 02/09/18  Yes Nahser, Wonda Cheng, MD  fludrocortisone (FLORINEF) 0.1 MG tablet Take 0.1 mg by mouth daily as needed for dizziness.   Yes [provider]  hydrocortisone (CORTEF) 10 MG tablet Take 10 mg by mouth daily.    Yes [provider]  hydroxychloroquine (PLAQUENIL) 200 MG tablet Take 1  tablet by mouth 2 (two) times daily. 02/15/18  Yes [provider]  levothyroxine (SYNTHROID, LEVOTHROID) 100 MCG tablet Take 1 tablet by mouth daily. 02/09/18  Yes [provider]  methocarbamol (ROBAXIN) 500 MG tablet Take 1 tablet (500 mg total) by mouth every 8 (eight) hours as needed for muscle spasms. 04/25/18 10/22/18 Yes Vevelyn Francois, NP  oxyCODONE-acetaminophen (PERCOCET) 5-325 MG tablet Take 1 tablet by mouth every 4 (four) hours as needed for severe pain. 06/09/18 06/09/19 Yes Laban Emperor, PA-C  predniSONE (DELTASONE) 10 MG tablet Take 6 tablets on day 1, take 5 tablets on day 2, take 4 tablets on day 3, take 3 tablets on day 4, take 2 tablets on day 5, take 1 tablet on day 6 06/09/18  Yes Laban Emperor, PA-C  sildenafil (VIAGRA) 100 MG tablet Take 1 tablet by mouth daily as needed. 02/09/18  Yes [provider]  testosterone cypionate (DEPOTESTOTERONE CYPIONATE) 200 MG/ML injection Inject 100 mg into the muscle every 14 (fourteen) days.    Yes [provider]  traMADol (ULTRAM) 50 MG tablet Take 1-2 tablets (50-100 mg total) by mouth every 6 (six) hours as needed for severe pain. 05/14/18 11/10/18 Yes King, Diona Foley, NP  valsartan (DIOVAN) 320 MG tablet TAKE 1 TABLET (320 MG TOTAL) BY MOUTH DAILY. 09/01/17  Yes Nahser, Wonda Cheng, MD  Vitamin D, Ergocalciferol, (DRISDOL) 50000 units CAPS capsule Take 50,000 Units by mouth once a week. 01/06/18  Yes [provider]    Family History Family History  Problem Relation Age of Onset  . Osteoarthritis Mother   . Heart attack Father 55  . Hypertension Father   . Hypertension Sister     Social History Social History   Tobacco Use  . Smoking status: Former Smoker    Last attempt to quit: 11/30/1995    Years since quitting: 22.5  . Smokeless tobacco: Former Systems developer    Quit date: 11/30/1995  Substance Use Topics  . Alcohol use: Yes    Alcohol/week: 0.0 standard drinks    Comment: rarely  . Drug use:  No     Allergies   Borax   Review of Systems Review of Systems  Genitourinary: Negative.   Musculoskeletal: Negative for back pain.  Neurological: Positive for numbness.   Physical Exam Triage Vital Signs ED Triage Vitals  Enc Vitals Group     BP 06/10/18 1012 140/87     Pulse Rate 06/10/18 1012 (!) 109     Resp 06/10/18 1012 18     Temp 06/10/18 1012 98.7 F (37.1 C)     Temp Source 06/10/18 1012 Oral     SpO2 06/10/18 1012 97 %     Weight 06/10/18 1010 291 lb (132 kg)     Height 06/10/18 1010 6\' 2"  (1.88 m)     Head Circumference --      Peak Flow --      Pain Score 06/10/18 1010 4     Pain Loc --      Pain Edu? --      Excl. in [place]? --    Updated Vital Signs BP 140/87 (BP Location: Left Arm)  Pulse (!) 109   Temp 98.7 F (37.1 C) (Oral)   Resp 18   Ht 6\' 2"  (1.88 m)   Wt 132 kg   SpO2 97%   BMI 37.36 kg/m   Visual Acuity Right Eye Distance:   Left Eye Distance:   Bilateral Distance:    Right Eye Near:   Left Eye Near:    Bilateral Near:     Physical Exam  Constitutional: He is oriented to person, place, and time. He appears well-developed. No distress.  HENT:  Head: Normocephalic and atraumatic.  Cardiovascular: Normal rate and regular rhythm.  Pulmonary/Chest: Effort normal. No respiratory distress.  Musculoskeletal:  Low back with no tenderness to palpation.  Patient has normal lower extremity muscle strength.  Neurological: He is alert and oriented to person, place, and time.  Psychiatric: He has a normal mood and affect. His behavior is normal.  Nursing note and vitals reviewed.  UC Treatments / Results  Labs (all labs ordered are listed, but only abnormal results are displayed) Labs Reviewed  BASIC METABOLIC PANEL - Abnormal; Notable for the following components:      Result Value   CO2 21 (*)    Glucose, Bld 203 (*)    All other components within normal limits    EKG None  Radiology No results found.  Procedures Procedures  (including critical care time)  Medications Ordered in UC Medications - No data to display  Initial Impression / Assessment and Plan / UC Course  I have reviewed the triage vital signs and the nursing notes.  Pertinent labs & imaging results that were available during my care of the patient were reviewed by me and considered in my medical decision making (see chart for details).    42 year old male presents with lumbar radiculopathy.  Repeat metabolic panel with normal creatinine.  Arranging MRI per patient request.  Final Clinical Impressions(s) / UC Diagnoses   Final diagnoses:  Lumbar radiculopathy     Discharge Instructions     We will call with the results.  Take care  Dr. Lacinda Axon    ED Prescriptions    None     Controlled Substance Prescriptions [place] Controlled Substance Registry consulted? Not Applicable   Coral Spikes, DO 06/10/18 1118

## 2018-06-10 NOTE — ED Triage Notes (Signed)
Patient states that he was seen ED yesterday for possible ruptured disc. Patient states that he was unable to have MRI due to Creatinine level. Patient wife works at [employer] in [place] and told patient that if he could get an order today that they could work him in on the truck of MRI. Patient states that he would need his creatinine level to be down. Patient states that he has not had any injury.

## 2018-06-10 NOTE — Discharge Instructions (Signed)
We will call with the results.  Take care  Dr. Quaron Delacruz  

## 2018-06-13 NOTE — Telephone Encounter (Signed)
Received verbal order today for an MRI of lumbar spine without contrast.  Can you get authorized and call patient to answer the questions please. I left a message informing the patient and told him to return our call.   Thank  you

## 2018-06-23 ENCOUNTER — Other Ambulatory Visit: Payer: Self-pay

## 2018-06-23 ENCOUNTER — Encounter: Payer: Self-pay | Admitting: Family Medicine

## 2018-06-23 ENCOUNTER — Ambulatory Visit (INDEPENDENT_AMBULATORY_CARE_PROVIDER_SITE_OTHER): Payer: 59 | Admitting: Family Medicine

## 2018-06-23 VITALS — BP 112/68 | HR 89 | Temp 98.9°F | Resp 18 | Ht 74.0 in | Wt 297.2 lb

## 2018-06-23 DIAGNOSIS — G4733 Obstructive sleep apnea (adult) (pediatric): Secondary | ICD-10-CM | POA: Diagnosis not present

## 2018-06-23 DIAGNOSIS — Z5181 Encounter for therapeutic drug level monitoring: Secondary | ICD-10-CM | POA: Diagnosis not present

## 2018-06-23 DIAGNOSIS — F1120 Opioid dependence, uncomplicated: Secondary | ICD-10-CM

## 2018-06-23 DIAGNOSIS — I10 Essential (primary) hypertension: Secondary | ICD-10-CM | POA: Diagnosis not present

## 2018-06-23 DIAGNOSIS — Z6838 Body mass index (BMI) 38.0-38.9, adult: Secondary | ICD-10-CM

## 2018-06-23 DIAGNOSIS — E2749 Other adrenocortical insufficiency: Secondary | ICD-10-CM

## 2018-06-23 DIAGNOSIS — E039 Hypothyroidism, unspecified: Secondary | ICD-10-CM

## 2018-06-23 DIAGNOSIS — M5126 Other intervertebral disc displacement, lumbar region: Secondary | ICD-10-CM | POA: Diagnosis not present

## 2018-06-23 DIAGNOSIS — M5136 Other intervertebral disc degeneration, lumbar region: Secondary | ICD-10-CM

## 2018-06-23 DIAGNOSIS — E782 Mixed hyperlipidemia: Secondary | ICD-10-CM

## 2018-06-23 DIAGNOSIS — M47816 Spondylosis without myelopathy or radiculopathy, lumbar region: Secondary | ICD-10-CM | POA: Diagnosis not present

## 2018-06-23 NOTE — Progress Notes (Signed)
Name: Dean Ford   MRN: [id number]    DOB: 1975-12-15   Date:06/23/2018       Progress Note  Subjective  Chief Complaint  Chief Complaint  Patient presents with  . Establish Care  . Follow-up    ER rupture disc    HPI  Pt presents to establish care and for the following:  Back pain/Lumbar Radiculopathy/ER follow up: Was seen in the ER 06/10/18 and told he needed PCP.  He had MRI with results as below.  He has appointment with Neurosurgery on 06/29/2018 with Dr. Trenton Gammon - has history of multiple back surgeries in the past.  Pain mamangement - Seeing Dr. Dossie Arbour.  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  Location: right lumbar area, left lumbar area or radiating to right leg(s); endorses numbness in right foot and leg that has been ongoing and worsening after 06/10/2018. Pain severity:  5 /10 Pain quality:  aching and cramping  IMPRESSION: 1. Right paracentral and foraminal disc protrusion at L5-S1 with unchanged impingement of the descending right S1 nerve root. Unchanged severe right neuroforaminal stenosis likely affecting the exiting right L5 nerve root. 2. Unchanged mild degenerative disc disease at L4-L5. Electronically Signed   By: Titus Dubin M.D.   On: 06/10/2018 12:27  HTN: He has been seeing Dr. Acie Fredrickson annually; pt's father passed away at age 54 from [place]. He is taking carvedilol 6.25mg  - usually just takes it in the morning and skips his doses sometimes. Taking valsartan every night 320mg . Denies headaches, vision changes, chest pain, shortness of breath.  HLD: He has been seeing Dr. Acie Fredrickson annually; TGD's were elevated in the 400's last visit but pt states he was not fasting so he is not taking the medication he was prescribed - was supposed to take atorvastatin and fenofibrate.   Adrenal Failure/Hypothyroidism: Sees Dr. Chalmers Cater at [facility] (Endocrinology);   Taking synthroid, testosterone, fludrocortisone, viagra, prednisone.  Used to get a lot of swelling in the LEFT leg, was not able to absorb electrolytes - this has since resolved since seeing Dr. Chalmers Cater.  States they have also been checking A1C as well.  Rheumatoid Arthritis Markers: Sees rheumatology at [facility] Taking plaquenil - supposed to be taking twice daily, but only takes once daily.  States he was never diagnosed with RA but had elevated markers.   Obesity: Body mass index is 38.16 kg/m. Weight Management History: Diet: Eating balanced diet; taking prednisone; skips breakfast Exercise: not active - severe back pain. Co-Morbid Conditions: dyslipidemias, hypertension, osteoarthritis and sleep apnea/hypopnea; 2 or more of these conditions combined with BMI >30 is considered morbid obesity; is this diagnosis appropriate and/or added to patient's problem list? Yes  OSA: Using CPAP daily.  Patient Active Problem List   Diagnosis Date Noted  . Chronic musculoskeletal pain 04/25/2018  . Spondylosis without myelopathy or radiculopathy, lumbosacral region 02/08/2018  . Chronic lower extremity pain (Secondary Area of Pain) (Bilateral) (R>L) 02/08/2018  . Acute myofascial pain 05/04/2017  . 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 Acute lumbar radiculopathy (Right) (S1) 05/26/2016  . Spasm of paraspinal muscle 01/22/2016  . Lower extremity numbness (Bilateral) (R>L) 01/22/2016  . Chronic lumbar radicular pain (Right:S1, Bilateral:L5) (Bilateral) (R>L) 01/22/2016  . Chronic hip pain (Right) 01/22/2016  . Osteoarthritis of hip (Right) 01/22/2016  . Chronic low back pain (Primary Source of  Pain) (R>L) 12/18/2015  . Erectile dysfunction 12/18/2015  . Chronic pain 09/02/2015  . Long term prescription opiate use 09/02/2015  . Encounter for chronic pain management 09/02/2015  . Chronic sacroiliac joint pain (Bilateral)  (R>L) 06/11/2015  . Lumbar facet hypertrophy 06/11/2015  . Lumbosacral radiculopathy at S1 (right-sided) 06/11/2015  . Encounter for therapeutic drug level monitoring 06/03/2015  . Long term current use of opiate analgesic 06/03/2015  . Uncomplicated opioid dependence ([place]) 06/03/2015  . Opiate use 06/03/2015  . Lumbar spondylosis 06/03/2015  . Lumbar facet syndrome (Bilateral) (R>L) 06/03/2015  . Insomnia, persistent 06/03/2015  . Intermittent muscle cramps 06/03/2015  . Always thirsty 06/03/2015  . Avitaminosis D 06/03/2015  . Lumbar spine pain 06/03/2015  . Hip arthrosis 06/03/2015  . Obesity 06/03/2015  . DDD (degenerative disc disease), lumbar 06/03/2015  . L4-L5 disc bulge 06/03/2015  . Ligamentum flavum hypertrophy ([place]) 06/03/2015  . Bulging lumbar disc 06/03/2015  . Sleep apnea 06/03/2015  . Dyslipidemia 03/01/2015  . Anxiety disorder 01/28/2015  . Dehydration symptoms 01/28/2015  . Essential hypertension 07/20/2014  . Chest discomfort 07/20/2014  . Hypothyroidism 07/20/2014  . Adrenal failure ([place]) 07/20/2014    Past Surgical History:  Procedure Laterality Date  . BACK SURGERY    . ELBOW SURGERY     right elbow    Family History  Problem Relation Age of Onset  . Osteoarthritis Mother   . Heart attack Father 43  . Hypertension Father   . Hypertension Sister     Social History   Socioeconomic History  . Marital status: Married    Spouse name: Stephaine  . Number of children: 1  . Years of education: Not on file  . Highest education level: Not on file  Occupational History  . Not on file  Social Needs  . Financial resource strain: Not hard at all  . Food insecurity:    Worry: Never true    Inability: Never true  . Transportation needs:    Medical: No    Non-medical: No  Tobacco Use  . Smoking status: Former Smoker    Last attempt to quit: 11/30/1995    Years since quitting: 22.5  . Smokeless tobacco: Former Systems developer    Quit date: 11/30/1995    Substance and Sexual Activity  . Alcohol use: Yes    Alcohol/week: 0.0 standard drinks    Comment: rarely  . Drug use: No  . Sexual activity: Yes    Partners: Female  Lifestyle  . Physical activity:    Days per week: 0 days    Minutes per session: 0 min  . Stress: Not at all  Relationships  . Social connections:    Talks on phone: More than three times a week    Gets together: More than three times a week    Attends religious service: Never    Active member of club or organization: No    Attends meetings of clubs or organizations: Never    Relationship status: Married  . Intimate partner violence:    Fear of current or ex partner: No    Emotionally abused: No    Physically abused: No    Forced sexual activity: No  Other Topics Concern  . Not on file  Social History Narrative  . Not on file     Current Outpatient Medications:  .  carvedilol (COREG) 6.25 MG tablet, TAKE 1 TABLET (6.25 MG TOTAL) BY MOUTH 2 (TWO) TIMES DAILY., Disp: 180 tablet, Rfl: 1 .  fludrocortisone (FLORINEF) 0.1 MG tablet, Take 0.1 mg by mouth daily as needed for dizziness., Disp: , Rfl:  .  hydrocortisone (CORTEF) 10 MG tablet, Take 10 mg by mouth daily. , Disp: , Rfl:  .  hydroxychloroquine (PLAQUENIL) 200 MG tablet, Take 1 tablet by mouth 2 (two) times daily., Disp: , Rfl: 3 .  levothyroxine (SYNTHROID, LEVOTHROID) 100 MCG tablet, Take 1 tablet by mouth daily., Disp: , Rfl: 3 .  methocarbamol (ROBAXIN) 500 MG tablet, Take 1 tablet (500 mg total) by mouth every 8 (eight) hours as needed for muscle spasms., Disp: 90 tablet, Rfl: 5 .  predniSONE (DELTASONE) 10 MG tablet, Take 6 tablets on day 1, take 5 tablets on day 2, take 4 tablets on day 3, take 3 tablets on day 4, take 2 tablets on day 5, take 1 tablet on day 6, Disp: 21 tablet, Rfl: 0 .  sildenafil (VIAGRA) 100 MG tablet, Take 1 tablet by mouth daily as needed., Disp: , Rfl: 12 .  testosterone cypionate (DEPOTESTOTERONE CYPIONATE) 200 MG/ML  injection, Inject 100 mg into the muscle every 14 (fourteen) days. , Disp: , Rfl:  .  traMADol (ULTRAM) 50 MG tablet, Take 1-2 tablets (50-100 mg total) by mouth every 6 (six) hours as needed for severe pain., Disp: 240 tablet, Rfl: 5 .  valsartan (DIOVAN) 320 MG tablet, TAKE 1 TABLET (320 MG TOTAL) BY MOUTH DAILY., Disp: 90 tablet, Rfl: 3 .  Vitamin D, Ergocalciferol, (DRISDOL) 50000 units CAPS capsule, Take 50,000 Units by mouth once a week., Disp: , Rfl: 2 .  oxyCODONE-acetaminophen (PERCOCET) 5-325 MG tablet, Take 1 tablet by mouth every 4 (four) hours as needed for severe pain. (Patient not taking: Reported on 06/23/2018), Disp: 6 tablet, Rfl: 0  Allergies  Allergen Reactions  . Borax Rash    Borax detergent    I personally reviewed active problem list, medication list, allergies, health maintenance, lab results with the patient/caregiver today.  ROS  Ten systems reviewed and is negative except as mentioned in HPI.  Objective  Vitals:   06/23/18 1019  BP: 112/68  Pulse: 89  Resp: 18  Temp: 98.9 F (37.2 C)  TempSrc: Oral  SpO2: 98%  Weight: 297 lb 3.2 oz (134.8 kg)  Height: 6\' 2"  (1.88 m)   Body mass index is 38.16 kg/m.  Physical Exam  Constitutional: Patient appears well-developed and well-nourished. No distress.  HENT: Head: Normocephalic and atraumatic.  Neck: Normal range of motion. Neck supple. No JVD present. No thyromegaly present.  Cardiovascular: Normal rate, regular rhythm and normal heart sounds.  No murmur heard. No BLE edema. Pulmonary/Chest: Effort normal and breath sounds normal. No respiratory distress. Musculoskeletal: Normal range of motion, no joint effusions. No gross deformities Neurological: Pt is alert and oriented to person, place, and time. No cranial nerve deficit. Coordination, balance, strength, speech and gait are normal.  Skin: Skin is warm and dry. No rash noted. No erythema.  Psychiatric: Patient has a normal mood and affect. behavior  is normal. Judgment and thought content normal.  No results found for this or any previous visit (from the past 72 hour(s)).  PHQ2/9: Depression screen [facility] 2/9 06/23/2018 04/25/2018 11/15/2017 05/17/2017 05/04/2017  Decreased Interest 0 0 0 0 0  Down, Depressed, Hopeless 0 0 0 0 0  PHQ - 2 Score 0 0 0 0 0  Altered sleeping 0 - - - -  Tired, decreased energy 0 - - - -  Change in appetite 0 - - - -  Feeling bad or failure about yourself  0 - - - -  Trouble concentrating 0 - - - -  Moving slowly or fidgety/restless 0 - - - -  Suicidal thoughts 0 - - - -  PHQ-9 Score 0 - - - -  Difficult doing work/chores Not difficult at all - - - -   Fall Risk: Fall Risk  06/23/2018 04/25/2018 02/08/2018 11/15/2017 05/17/2017  Falls in the past year? 0 No No No No  Number falls in past yr: - - - - -  Injury with Fall? - - - - -  Comment - - - - -  Risk for fall due to : - - - - Other (Comment)  Risk for fall due to: Comment - - - - -  Follow up - - - - -     Assessment & Plan  1. Bulging lumbar disc 2. DDD (degenerative disc disease), lumbar 3. Lumbar spondylosis - Seeing neurosurgery  4. Class 2 severe obesity due to excess calories with serious comorbidity and body mass index (BMI) of 38.0 to 38.9 in adult [facility]) - Discussed importance of 150 minutes of physical activity weekly, eat two servings of fish weekly, eat one serving of tree nuts ( cashews, pistachios, pecans, almonds.Marland Kitchen) every other day, eat 6 servings of fruit/vegetables daily and drink plenty of water and avoid sweet beverages.   5. Uncomplicated opioid dependence ([place]) - Continue with pain management  6. Mixed hyperlipidemia - Continue with Cardiology  7. Essential hypertension - Continue with Cardiology  8. Adrenal failure (HCC) - COMPLETE METABOLIC PANEL WITH GFR - CBC w/Diff/Platelet - Continue with endocrinology  9. Obstructive sleep apnea syndrome - CBC w/Diff/Platelet  10. Hypothyroidism, unspecified type - Continue with  endocrinology  11. Encounter for medication monitoring - COMPLETE METABOLIC PANEL WITH GFR - CBC w/Diff/Platelet - Continue with rheumatology and endocrinology

## 2018-06-24 LAB — CBC WITH DIFFERENTIAL/PLATELET
BASOS ABS: 40 {cells}/uL (ref 0–200)
Basophils Relative: 0.5 %
EOS ABS: 168 {cells}/uL (ref 15–500)
EOS PCT: 2.1 %
HCT: 44.4 % (ref 38.5–50.0)
Hemoglobin: 15.8 g/dL (ref 13.2–17.1)
Lymphs Abs: 1992 cells/uL (ref 850–3900)
MCH: 30 pg (ref 27.0–33.0)
MCHC: 35.6 g/dL (ref 32.0–36.0)
MCV: 84.4 fL (ref 80.0–100.0)
MONOS PCT: 8.3 %
MPV: 9.4 fL (ref 7.5–12.5)
NEUTROS ABS: 5136 {cells}/uL (ref 1500–7800)
Neutrophils Relative %: 64.2 %
Platelets: 266 10*3/uL (ref 140–400)
RBC: 5.26 10*6/uL (ref 4.20–5.80)
RDW: 12.6 % (ref 11.0–15.0)
Total Lymphocyte: 24.9 %
WBC mixed population: 664 cells/uL (ref 200–950)
WBC: 8 10*3/uL (ref 3.8–10.8)

## 2018-06-24 LAB — COMPLETE METABOLIC PANEL WITH GFR
AG RATIO: 1.8 (calc) (ref 1.0–2.5)
ALT: 40 U/L (ref 9–46)
AST: 19 U/L (ref 10–40)
Albumin: 4.2 g/dL (ref 3.6–5.1)
Alkaline phosphatase (APISO): 44 U/L (ref 40–115)
BILIRUBIN TOTAL: 0.3 mg/dL (ref 0.2–1.2)
BUN: 12 mg/dL (ref 7–25)
CHLORIDE: 104 mmol/L (ref 98–110)
CO2: 26 mmol/L (ref 20–32)
Calcium: 9.3 mg/dL (ref 8.6–10.3)
Creat: 0.88 mg/dL (ref 0.60–1.35)
GFR, EST AFRICAN AMERICAN: 122 mL/min/{1.73_m2} (ref >=60)
GFR, EST NON AFRICAN AMERICAN: 105 mL/min/{1.73_m2} (ref >=60)
GLOBULIN: 2.4 g/dL (ref 1.9–3.7)
Glucose, Bld: 107 mg/dL (ref 65–139)
Potassium: 4.1 mmol/L (ref 3.5–5.3)
Sodium: 140 mmol/L (ref 135–146)
TOTAL PROTEIN: 6.6 g/dL (ref 6.1–8.1)

## 2018-06-29 DIAGNOSIS — M5416 Radiculopathy, lumbar region: Secondary | ICD-10-CM | POA: Diagnosis not present

## 2018-07-07 DIAGNOSIS — Z9889 Other specified postprocedural states: Secondary | ICD-10-CM | POA: Diagnosis not present

## 2018-07-07 DIAGNOSIS — M5416 Radiculopathy, lumbar region: Secondary | ICD-10-CM | POA: Diagnosis not present

## 2018-07-07 DIAGNOSIS — M5117 Intervertebral disc disorders with radiculopathy, lumbosacral region: Secondary | ICD-10-CM | POA: Diagnosis not present

## 2018-07-07 DIAGNOSIS — M5126 Other intervertebral disc displacement, lumbar region: Secondary | ICD-10-CM | POA: Diagnosis not present

## 2018-07-25 ENCOUNTER — Ambulatory Visit (INDEPENDENT_AMBULATORY_CARE_PROVIDER_SITE_OTHER): Payer: 59 | Admitting: Cardiovascular Disease

## 2018-07-25 ENCOUNTER — Encounter: Payer: Self-pay | Admitting: Cardiovascular Disease

## 2018-07-25 VITALS — BP 116/86 | HR 89 | Ht 74.0 in | Wt 302.0 lb

## 2018-07-25 DIAGNOSIS — E785 Hyperlipidemia, unspecified: Secondary | ICD-10-CM

## 2018-07-25 NOTE — Progress Notes (Signed)
Cardiology Office Note   Date:  07/25/2018   ID:  Dean Ford, DOB February 14, 1976, MRN [id number]  PCP:  Hubbard Hartshorn, FNP  Cardiologist:   Mertie Moores, MD   Chief Complaint  Patient presents with  . Hyperlipidemia   1. Hypertension 2. Chest pain - had a negative stress echo in Jan. 2016 3. Hypothyroidism 4.  Obstructive sleep apnea 5.  Adrenal insufficiency   Dean Ford is a 42 who is referred by Dr. Debbora Dus for evaluation of his CP. He has a hx of HTN and also has hypoadrenalism, hypothyroidism, low testosterone.  CP is just left of center. Pain seems to be related to emotional stress. Starts with dyspnea. He would typically get shortness of breath, head ache and then get chest pain. Would take another losartan and all the symptoms would improve after an hour. Associated with exertion - walking . Seems to be worse in the afternoon  He snores at night. Wakes up gasping for air frequently. Has been referred for sleep study. He did not make the appt.  Does not get any regular exercise.  Has low energy due to his endocrine issues. Has had a head MRI. pituitary gland was ok. Has had cholesterol checked in the past - was borderline years ago.  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 Was not able to complete his last training session because of shortness of breath.   September 20, 2014:  Dean Ford is a 42 y.o. male who presents for follow-up for his chest pain. He had an echocardiogram that revealed normal left ventricle systolic function. He had a stress echo ( results are not found at this time) He has not been on his BP med.   BP has been higher .    No further CP,  Not exercising at all.   December 13, 2014:  Dean Ford had some chest pain - mid sternal, radiated through his back Worsened with deep breath / deep exhale Went to the [facility]  ER  Work up was negative  Has felt fine since that time.   Has been working out vigorously without any chest   December 05, 2015 Dean Ford is seen for follow up .   Has had elevated BP . Has been painting his bathroom, gets sweaty, red faced. Has some chest tightness.  Has symptoms of sleep apnea. Snores, wakes up snoring,  Not getting any other exercise  Trying to stay away from fast foods.  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  Has gained 9 lbs in the past 6 months    Oct. 17, 2017: Dean Ford is seen back today  He was diagnosed with adrenal insufficiency approximate one year ago. He's been on Cortef and Florinef since that time. Recently he's been having problems with high blood pressures. He tries to watch his salt intake. 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  Feels dehydrated all of the time.   Drinks lots of fluids all day long and then has to urinate all night long .   Jan. 12, 2018:   Dean Ford is seen today for follow-up visit. Has gained weight - 10 lbs since Oct. 2017. Had microdisc surgery 8  weeks ago. He had a ruptured disc between L5 and S1. He's back to work. He's not necessarily exercising much.  He takes the Florinef every 2-3 days .  Has cut out lots of his salty snacks  Has a poor appetite recently .   Has OSA,  Is going to get CPAP within the month   July 24, 2017:   doing ok Restarting back in his exercise  Working as a Manufacturing systems engineer for [employer] Has an ammonia smell to his sweat .   July 25, 2018: Dean Ford is doing fairly well.  He has a history of hypertension and obstructive sleep apnea.  He also has a history of adrenal insufficiency.  He is on Cortef and Florinef.  Had another back surgery .  No recent CP  BP looks good ,  Does not check at home Has to eat extra salt due to his adrenal insufficiency Especially during  the summer   Wt Readings from Last 3 Encounters:  07/25/18 (!) 302 lb (137 kg)  06/23/18 297 lb 3.2 oz (134.8 kg)  06/10/18 291 lb (132 kg)      Past Medical History:  Diagnosis Date  . Adrenal insufficiency ([place])   . Anxiety   . Anxiety disorder 01/28/2015  . Arthritis   . Back pain, chronic 06/03/2015  . Hypertension   . Hypothyroid   . Sleep apnea   . Thyroid disease     Past Surgical History:  Procedure Laterality Date  . BACK SURGERY    . ELBOW SURGERY     right elbow     Current Outpatient Medications  Medication Sig Dispense Refill  . carvedilol (COREG) 6.25 MG tablet TAKE 1 TABLET (6.25 MG TOTAL) BY MOUTH 2 (TWO) TIMES DAILY. 180 tablet 1  . fludrocortisone (FLORINEF) 0.1 MG tablet Take 0.1 mg by mouth daily as needed for dizziness.    . hydrocortisone (CORTEF) 10 MG tablet Take 10 mg by mouth daily.     . hydroxychloroquine (PLAQUENIL) 200 MG tablet Take 1 tablet by mouth daily.  3  . levothyroxine (SYNTHROID, LEVOTHROID) 100 MCG tablet Take 1 tablet by mouth daily.  3  . methocarbamol (ROBAXIN) 500 MG tablet Take 1 tablet (500 mg total) by mouth every 8 (eight) hours as needed for muscle spasms. 90 tablet 5  . predniSONE (DELTASONE) 10 MG tablet Take 6 tablets on day 1, take 5 tablets on day 2, take 4 tablets on day 3, take 3 tablets on day 4, take 2 tablets on day 5, take 1 tablet on day 6 21 tablet 0  . sildenafil (VIAGRA) 100 MG tablet Take 1 tablet by mouth daily as needed.  12  . testosterone cypionate (DEPOTESTOTERONE CYPIONATE) 200 MG/ML injection Inject 100 mg into the muscle every 14 (fourteen) days.     . traMADol (ULTRAM) 50 MG tablet Take 1-2 tablets (50-100 mg total) by mouth every 6 (six) hours as needed for severe pain. 240 tablet 5  . valsartan (DIOVAN) 320 MG tablet TAKE 1 TABLET (320 MG TOTAL) BY MOUTH DAILY. 90 tablet 3  . Vitamin D, Ergocalciferol, (DRISDOL) 50000 units CAPS capsule Take 50,000 Units by mouth once a week.  2   No current  facility-administered medications for this visit.     Allergies:   Borax    Social History:  The patient  reports that he quit smoking about 22 years ago. He quit smokeless tobacco use about 22 years ago. He reports that he drinks  alcohol. He reports that he does not use drugs.   Family History:  The patient's family history includes Heart attack (age of onset: 90) in his father; Hypertension in his father and sister; Osteoarthritis in his mother.    ROS: As noted in the current history.  All other systems are negative.   Physical Exam: Blood pressure 116/86, pulse 89, height 6\' 2"  (1.88 m), weight (!) 302 lb (137 kg), SpO2 95 %.  GEN:  Well nourished, well developed in no acute distress HEENT: Normal NECK: No JVD; No carotid bruits LYMPHATICS: No lymphadenopathy CARDIAC: RRR   RESPIRATORY:  Clear to auscultation without rales, wheezing or rhonchi  ABDOMEN: Soft, non-tender, non-distended MUSCULOSKELETAL:  No edema; No deformity  SKIN: Warm and dry NEUROLOGIC:  Alert and oriented x 3 ex  EKG:     Recent Labs: 06/23/2018: ALT 40; BUN 12; Creat 0.88; Hemoglobin 15.8; Platelets 266; Potassium 4.1; Sodium 140    Lipid Panel    Component Value Date/Time   CHOL 239 (H) 07/23/2017 0901   TRIG 414 (H) 07/23/2017 0901   HDL 34 (L) 07/23/2017 0901   CHOLHDL 7.0 (H) 07/23/2017 0901   CHOLHDL 6.2 (H) 03/12/2016 0755   VLDL 58 (H) 03/12/2016 0755   LDLCALC Comment 07/23/2017 0901      Wt Readings from Last 3 Encounters:  07/25/18 (!) 302 lb (137 kg)  06/23/18 297 lb 3.2 oz (134.8 kg)  06/10/18 291 lb (132 kg)      Other studies Reviewed: Additional studies/ records that were reviewed today include:  Stress echo and resting echo . Review of the above records demonstrates: normal LV function    ASSESSMENT AND PLAN:  1. Hypertension -    BP is well controlled  Has to eat extra salt in the summer to maintain his BP ( due to his adrenal insufficiency )  Follow up in 1  year   2. Chest pain - no recurrent CP   3. Hypothyroidism -    4. ? Obstructive sleep apnea -    5. Adrenal insufficiency:.   Managed by Dr. Chalmers Cater      Current medicines are reviewed at length with the patient today.  The patient does not have concerns regarding medicines.  The following changes have been made: No changes.        Signed, Mertie Moores, MD  07/25/2018 5:07 PM    [facility] [place], [place], [place]  [address] Phone: [phone]; Fax: [phone]

## 2018-07-25 NOTE — Patient Instructions (Signed)

## 2018-08-17 ENCOUNTER — Encounter: Payer: Self-pay | Admitting: Family Medicine

## 2018-08-19 ENCOUNTER — Other Ambulatory Visit: Payer: Self-pay | Admitting: Cardiovascular Disease

## 2018-09-13 ENCOUNTER — Other Ambulatory Visit: Payer: Self-pay | Admitting: Cardiovascular Disease

## 2018-09-26 ENCOUNTER — Encounter: Payer: Self-pay | Admitting: Family Medicine

## 2018-09-26 ENCOUNTER — Other Ambulatory Visit: Payer: Self-pay

## 2018-09-26 ENCOUNTER — Ambulatory Visit (INDEPENDENT_AMBULATORY_CARE_PROVIDER_SITE_OTHER): Payer: 59 | Admitting: Family Medicine

## 2018-09-26 VITALS — BP 128/84 | HR 84 | Temp 98.2°F | Resp 18 | Ht 74.0 in | Wt 305.0 lb

## 2018-09-26 DIAGNOSIS — D229 Melanocytic nevi, unspecified: Secondary | ICD-10-CM

## 2018-09-26 DIAGNOSIS — Z1159 Encounter for screening for other viral diseases: Secondary | ICD-10-CM

## 2018-09-26 DIAGNOSIS — Z125 Encounter for screening for malignant neoplasm of prostate: Secondary | ICD-10-CM

## 2018-09-26 DIAGNOSIS — Z114 Encounter for screening for human immunodeficiency virus [HIV]: Secondary | ICD-10-CM

## 2018-09-26 DIAGNOSIS — G4733 Obstructive sleep apnea (adult) (pediatric): Secondary | ICD-10-CM

## 2018-09-26 DIAGNOSIS — Z0000 Encounter for general adult medical examination without abnormal findings: Secondary | ICD-10-CM

## 2018-09-26 DIAGNOSIS — E039 Hypothyroidism, unspecified: Secondary | ICD-10-CM

## 2018-09-26 DIAGNOSIS — Z6838 Body mass index (BMI) 38.0-38.9, adult: Secondary | ICD-10-CM

## 2018-09-26 DIAGNOSIS — E2740 Unspecified adrenocortical insufficiency: Secondary | ICD-10-CM

## 2018-09-26 DIAGNOSIS — M5441 Lumbago with sciatica, right side: Secondary | ICD-10-CM

## 2018-09-26 DIAGNOSIS — R195 Other fecal abnormalities: Secondary | ICD-10-CM

## 2018-09-26 DIAGNOSIS — I10 Essential (primary) hypertension: Secondary | ICD-10-CM

## 2018-09-26 DIAGNOSIS — M5417 Radiculopathy, lumbosacral region: Secondary | ICD-10-CM

## 2018-09-26 DIAGNOSIS — E785 Hyperlipidemia, unspecified: Secondary | ICD-10-CM

## 2018-09-26 DIAGNOSIS — G8929 Other chronic pain: Secondary | ICD-10-CM

## 2018-09-26 DIAGNOSIS — E559 Vitamin D deficiency, unspecified: Secondary | ICD-10-CM

## 2018-09-26 DIAGNOSIS — F1120 Opioid dependence, uncomplicated: Secondary | ICD-10-CM

## 2018-09-26 LAB — HEMOCCULT GUIAC POC 1CARD (OFFICE)
Card #2 Fecal Occult Blod, POC: POSITIVE
FECAL OCCULT BLD: POSITIVE — AB

## 2018-09-26 NOTE — Progress Notes (Signed)
Name: Dean Ford   MRN: [id number]    DOB: 1975/10/05   Date:09/26/2018       Progress Note  Subjective  Chief Complaint  Chief Complaint  Patient presents with  . Annual Exam    HPI  Patient presents for annual CPE and follow up:  Back pain/Lumbar Radiculopathy: Was seen in the ER 06/10/18 and told he needed PCP.  He had MRI with results as below.  He has appointment with Neurosurgery on 06/29/2018 with Dr. Trenton Gammon - has history of multiple back surgeries in the past.  Pain mamangement - Seeing Dr. Dossie Arbour.  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 He did recently file for disability.  He also had recent "micro laminectomy" performed by Dr. Trenton Gammon in December 2019.  HTN: He has been seeing Dr. Acie Fredrickson annually; pt's father passed away at age 73 from [place]. He is taking carvedilol 6.25mg  - usually just takes it in the morning and skips his doses sometimes. Taking valsartan every night 320mg . Denies headaches, vision changes, chest pain, shortness of breath.  HLD: He has been seeing Dr. Acie Fredrickson annually - last visit was December. TGD's were elevated in the 400's last visit but pt states he was not fasting so he is not taking the medication he was prescribed.  He is fearful of statin medcations and will consider taking fenofibrate.  Adrenal Failure/Hypothyroidism: Sees Dr. Chalmers Cater at [facility] (Endocrinology);  Taking synthroid, testosterone (once a month to once every 6 weeks), fludrocortisone, viagra, prednisone.  Used to get a lot of swelling in the LEFT leg, was not able to absorb electrolytes - this has since resolved since seeing Dr. Chalmers Cater.  He is not sure of recent lab testing results. We will try to request records again.  Otherwise, stable and unchanged.  Rheumatoid Arthritis Markers & Vitamin D Deficiency: Sees rheumatology at [facility]. Taking plaquenil - supposed  to be taking twice daily, but only takes once daily.  States he was never diagnosed with RA but had elevated markers. He does note recent labs showing low vitamin D - taking supplement now.   Obesity: Body mass index is 39.16 kg/m. Weight Management History: Diet: Eating balanced diet; taking prednisone; skips breakfast; trying to decrease carbohydrates more Exercise: not able to be active - severe back pain. Co-Morbid Conditions: dyslipidemias, hypertension, osteoarthritis and sleep apnea/hypopnea; 2 or more of these conditions combined with BMI >30 is considered morbid obesity; is this diagnosis appropriate and/or added to patient's problem list? Yes Unchanged.  OSA: Using CPAP daily. Doing well.  USPSTF grade A and B recommendations:  Depression: Says it has been "humbling" dealing with all of his medical issues over the last year or so.  He has been trying to apply for disability, and is working on this.  He is not having "negative thoughts", but just wishes he could be working instead of disabled.  Depression screen [facility] 2/9 09/26/2018 06/23/2018 04/25/2018 11/15/2017 05/17/2017  Decreased Interest 0 0 0 0 0  Down, Depressed, Hopeless 0 0 0 0 0  PHQ - 2 Score 0 0 0 0 0  Altered sleeping 0 0 - - -  Tired, decreased energy 0 0 - - -  Change in appetite 0 0 - - -  Feeling bad or failure about yourself  0 0 - - -  Trouble concentrating 0 0 - - -  Moving slowly or  fidgety/restless 0 0 - - -  Suicidal thoughts 0 0 - - -  PHQ-9 Score 0 0 - - -  Difficult doing work/chores Not difficult at all Not difficult at all - - -    Hypertension:  BP Readings from Last 3 Encounters:  09/26/18 128/84  07/25/18 116/86  06/23/18 112/68    Obesity: Wt Readings from Last 3 Encounters:  09/26/18 (!) 305 lb (138.3 kg)  07/25/18 (!) 302 lb (137 kg)  06/23/18 297 lb 3.2 oz (134.8 kg)   BMI Readings from Last 3 Encounters:  09/26/18 39.16 kg/m  07/25/18 38.77 kg/m  06/23/18 38.16 kg/m     Lipids:  Lab Results  Component Value Date   CHOL 239 (H) 07/23/2017   CHOL 185 03/12/2016   CHOL 255 (H) 12/05/2015   Lab Results  Component Value Date   HDL 34 (L) 07/23/2017   HDL 30 (L) 03/12/2016   HDL 29 (L) 12/05/2015   Lab Results  Component Value Date   LDLCALC Comment 07/23/2017   LDLCALC 97 03/12/2016   LDLCALC 149 (H) 12/05/2015   Lab Results  Component Value Date   TRIG 414 (H) 07/23/2017   TRIG 290 (H) 03/12/2016   TRIG 384 (H) 12/05/2015   Lab Results  Component Value Date   CHOLHDL 7.0 (H) 07/23/2017   CHOLHDL 6.2 (H) 03/12/2016   CHOLHDL 8.8 (H) 12/05/2015   No results found for: LDLDIRECT Glucose:  Glucose  Date Value Ref Range Status  12/04/2014 101 (H) mg/dL Final    Comment:    65-99 NOTE: New Reference Range  10/23/14    Glucose, Bld  Date Value Ref Range Status  06/23/2018 107 65 - 139 mg/dL Final    Comment:    .        Non-fasting reference interval .   06/10/2018 203 (H) 70 - 99 mg/dL Final  06/09/2018 101 (H) 70 - 99 mg/dL Final   Glucose-Capillary  Date Value Ref Range Status  03/15/2018 98 70 - 99 mg/dL Final      Office Visit from 09/26/2018 in [facility]  AUDIT-C Score  1     Married STD testing and prevention (HIV/chl/gon/syphilis): HIV today; declines other Hep C: Will check today.  Skin cancer: Has RIGHT forearm 1cm diameter irregular border, dark brown nevus that he feels has gotten bigger lately.  Would like to see dermatology Colorectal cancer: Denies family or personal history of colorectal cancer, no changes in BM's - no blood in stool, dark and tarry stool, mucus in stool, or constipation/diarrhea. Prostate cancer: Paternal grandfather had prostate cancer; Dad passed in his 29's, so hx is unclear. No results found for: PSA  IPSS Questionnaire (AUA-7): Over the past month.   1)  How often have you had a sensation of not emptying your bladder completely after you finish urinating?  5 -  Almost always  2)  How often have you had to urinate again less than two hours after you finished urinating? 1 - Less than 1 time in 5  3)  How often have you found you stopped and started again several times when you urinated?  0 - Not at all  4) How difficult have you found it to postpone urination?  0 - Not at all  5) How often have you had a weak urinary stream?  0 - Not at all  6) How often have you had to push or strain to begin urination?  0 -  Not at all  7) How many times did you most typically get up to urinate from the time you went to bed until the time you got up in the morning?  3 - 3 times  Total score:  0-7 mildly symptomatic   8-19 moderately symptomatic   20-35 severely symptomatic  Does have adrenal issues that cause him to diurese at night and "retain" fluid.  We will check PSA today.  Lung cancer:  Former smoker - 1998.  Low Dose CT Chest recommended if Age 94-80 years, 30 pack-year currently smoking OR have quit w/in 15years. Patient does not qualify.   AAA: N/A The USPSTF recommends one-time screening with ultrasonography in men ages 25 to 21 years who have ever smoked ECG:  No recent chest pain, shortness of breath, no palpitations. EKG on file.  Advanced Care Planning: A voluntary discussion about advance care planning including the explanation and discussion of advance directives.  Discussed health care proxy and Living will, and the patient was able to identify a health care proxy as [place].  Patient does not have a living will at present time. If patient does have living will, I have requested they bring this to the clinic to be scanned in to their chart.  Patient Active Problem List   Diagnosis Date Noted  . Chronic musculoskeletal pain 04/25/2018  . Spondylosis without myelopathy or radiculopathy, lumbosacral region 02/08/2018  . Chronic lower extremity pain (Secondary Area of Pain) (Bilateral) (R>L) 02/08/2018  . Acute myofascial pain 05/04/2017  . 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 Acute lumbar radiculopathy (Right) (S1) 05/26/2016  . Spasm of paraspinal muscle 01/22/2016  . Lower extremity numbness (Bilateral) (R>L) 01/22/2016  . Chronic lumbar radicular pain (Right:S1, Bilateral:L5) (Bilateral) (R>L) 01/22/2016  . Chronic hip pain (Right) 01/22/2016  . Osteoarthritis of hip (Right) 01/22/2016  . Chronic low back pain (Primary Source of Pain) (R>L) 12/18/2015  . Erectile dysfunction 12/18/2015  . Chronic pain 09/02/2015  . Long term prescription opiate use 09/02/2015  . Encounter for chronic pain management 09/02/2015  . Chronic sacroiliac joint pain (Bilateral) (R>L) 06/11/2015  . Lumbar facet hypertrophy 06/11/2015  . Lumbosacral radiculopathy at S1 (right-sided) 06/11/2015  . Encounter for therapeutic drug level monitoring 06/03/2015  . Long term current use of opiate analgesic 06/03/2015  . Uncomplicated opioid dependence ([place]) 06/03/2015  . Opiate use 06/03/2015  . Lumbar spondylosis 06/03/2015  . Lumbar facet syndrome (Bilateral) (R>L) 06/03/2015  . Insomnia, persistent 06/03/2015  . Intermittent muscle cramps 06/03/2015  . Always thirsty 06/03/2015  . Avitaminosis D 06/03/2015  . Lumbar spine pain 06/03/2015  . Hip arthrosis 06/03/2015  . Class 2 severe obesity due to excess calories with serious comorbidity and body mass index (BMI) of 38.0 to 38.9 in adult ([place]) 06/03/2015  . DDD (degenerative disc disease), lumbar 06/03/2015  . L4-L5 disc bulge 06/03/2015  . Ligamentum flavum hypertrophy ([place]) 06/03/2015  . Bulging lumbar disc 06/03/2015  . Sleep apnea 06/03/2015  . Dyslipidemia 03/01/2015  . Dehydration symptoms 01/28/2015  . Essential hypertension 07/20/2014  . Chest discomfort 07/20/2014  . Hypothyroidism 07/20/2014  . Adrenal failure ([place]) 07/20/2014    Past Surgical History:  Procedure Laterality Date  . BACK SURGERY    . ELBOW SURGERY     right  elbow    Family History  Problem Relation Age of Onset  . Osteoarthritis Mother   . Heart attack Father 71  . Hypertension Father   .  Hypertension Sister     Social History   Socioeconomic History  . Marital status: Married    Spouse name: Stephaine  . Number of children: 1  . Years of education: Not on file  . Highest education level: Not on file  Occupational History  . Not on file  Social Needs  . Financial resource strain: Not hard at all  . Food insecurity:    Worry: Never true    Inability: Never true  . Transportation needs:    Medical: No    Non-medical: No  Tobacco Use  . Smoking status: Former Smoker    Last attempt to quit: 11/30/1995    Years since quitting: 22.8  . Smokeless tobacco: Former Systems developer    Quit date: 11/30/1995  Substance and Sexual Activity  . Alcohol use: Yes    Alcohol/week: 0.0 standard drinks    Comment: rarely  . Drug use: No  . Sexual activity: Yes    Partners: Female  Lifestyle  . Physical activity:    Days per week: 3 days    Minutes per session: 30 min  . Stress: Not at all  Relationships  . Social connections:    Talks on phone: More than three times a week    Gets together: More than three times a week    Attends religious service: Never    Active member of club or organization: No    Attends meetings of clubs or organizations: Never    Relationship status: Married  . Intimate partner violence:    Fear of current or ex partner: No    Emotionally abused: No    Physically abused: No    Forced sexual activity: No  Other Topics Concern  . Not on file  Social History Narrative  . Not on file     Current Outpatient Medications:  .  carvedilol (COREG) 6.25 MG tablet, TAKE 1 TABLET BY MOUTH TWO TIMES DAILY, Disp: 180 tablet, Rfl: 3 .  fludrocortisone (FLORINEF) 0.1 MG tablet, Take 0.1 mg by mouth daily as needed for dizziness., Disp: , Rfl:  .  hydrocortisone (CORTEF) 10 MG tablet, Take 10 mg by mouth daily. , Disp: , Rfl:   .  hydroxychloroquine (PLAQUENIL) 200 MG tablet, Take 1 tablet by mouth daily., Disp: , Rfl: 3 .  levothyroxine (SYNTHROID, LEVOTHROID) 100 MCG tablet, Take 1 tablet by mouth daily., Disp: , Rfl: 3 .  methocarbamol (ROBAXIN) 500 MG tablet, Take 1 tablet (500 mg total) by mouth every 8 (eight) hours as needed for muscle spasms., Disp: 90 tablet, Rfl: 5 .  predniSONE (DELTASONE) 5 MG tablet, Take 5 mg by mouth daily with breakfast. take 1 1/2 (7.5mg ) qd, Disp: , Rfl:  .  sildenafil (VIAGRA) 100 MG tablet, Take 1 tablet by mouth daily as needed., Disp: , Rfl: 12 .  testosterone cypionate (DEPOTESTOTERONE CYPIONATE) 200 MG/ML injection, Inject 100 mg into the muscle every 14 (fourteen) days. , Disp: , Rfl:  .  traMADol (ULTRAM) 50 MG tablet, Take 1-2 tablets (50-100 mg total) by mouth every 6 (six) hours as needed for severe pain., Disp: 240 tablet, Rfl: 5 .  valsartan (DIOVAN) 320 MG tablet, TAKE 1 TABLET (320 MG TOTAL) BY MOUTH DAILY., Disp: 90 tablet, Rfl: 3 .  Vitamin D, Ergocalciferol, (DRISDOL) 50000 units CAPS capsule, Take 50,000 Units by mouth once a week., Disp: , Rfl: 2  Allergies  Allergen Reactions  . Borax Rash    Borax detergent   ROS  Constitutional:  Negative for fever or weight change.  Respiratory: Negative for cough and shortness of breath.   Cardiovascular: Negative for chest pain or palpitations.  Gastrointestinal: Negative for abdominal pain, no bowel changes.  Musculoskeletal: Negative for gait problem or joint swelling.  Skin: Negative for rash.  Neurological: Negative for dizziness or headache.  No other specific complaints in a complete review of systems (except as listed in HPI above).   Objective  Vitals:   09/26/18 0821  BP: 128/84  Pulse: 84  Resp: 18  Temp: 98.2 F (36.8 C)  TempSrc: Oral  SpO2: 93%  Weight: (!) 305 lb (138.3 kg)  Height: 6\' 2"  (1.88 m)    Body mass index is 39.16 kg/m.  Physical Exam  Constitutional: Patient appears  well-developed and well-nourished. No distress.  HENT: Head: Normocephalic and atraumatic. Ears: B TMs ok, no erythema or effusion; Nose: Nose normal. Mouth/Throat: Oropharynx is clear and moist. No oropharyngeal exudate.  Eyes: Conjunctivae and EOM are normal. Pupils are equal, round, and reactive to light. No scleral icterus.  Neck: Normal range of motion. Neck supple. No JVD present. No thyromegaly present.  Cardiovascular: Normal rate, regular rhythm and normal heart sounds.  No murmur heard. No BLE edema. Pulmonary/Chest: Effort normal and breath sounds normal. No respiratory distress. Abdominal: Soft. Bowel sounds are normal, no distension. There is no tenderness. no masses MALE GENITALIA: Deferred RECTAL: Prostate normal size and consistency, no rectal masses or hemorrhoids. Hemoccult + Musculoskeletal: Normal range of motion, no joint effusions. No gross deformities Neurological: he is alert and oriented to person, place, and time. No cranial nerve deficit. Coordination, balance, strength, speech and gait are normal.  Skin: Skin is warm and dry. No rash noted. No erythema.  Psychiatric: Patient has a normal mood and affect. behavior is normal. Judgment and thought content normal.  No results found for this or any previous visit (from the past 2160 hour(s)).  PHQ2/9: Depression screen [facility] 2/9 09/26/2018 06/23/2018 04/25/2018 11/15/2017 05/17/2017  Decreased Interest 0 0 0 0 0  Down, Depressed, Hopeless 0 0 0 0 0  PHQ - 2 Score 0 0 0 0 0  Altered sleeping 0 0 - - -  Tired, decreased energy 0 0 - - -  Change in appetite 0 0 - - -  Feeling bad or failure about yourself  0 0 - - -  Trouble concentrating 0 0 - - -  Moving slowly or fidgety/restless 0 0 - - -  Suicidal thoughts 0 0 - - -  PHQ-9 Score 0 0 - - -  Difficult doing work/chores Not difficult at all Not difficult at all - - -   Fall Risk: Fall Risk  09/26/2018 06/23/2018 04/25/2018 02/08/2018 11/15/2017  Falls in the past year? 1 0 No  No No  Number falls in past yr: 1 - - - -  Injury with Fall? 1 - - - -  Comment - - - - -  Risk for fall due to : - - - - -  Risk for fall due to: Comment - - - - -  Follow up Falls evaluation completed - - - -    Assessment & Plan  1. Annual physical exam -Prostate cancer screening and PSA options (with potential risks and benefits of testing vs not testing) were discussed along with recent recs/guidelines. -USPSTF grade A and B recommendations reviewed with patient; age-appropriate recommendations, preventive care, screening tests, etc discussed and encouraged; healthy living encouraged; see AVS for patient education  given to patient -Discussed importance of 150 minutes of physical activity weekly, eat two servings of fish weekly, eat one serving of tree nuts ( cashews, pistachios, pecans, almonds.Marland Kitchen) every other day, eat 6 servings of fruit/vegetables daily and drink plenty of water and avoid sweet beverages.   2. Uncomplicated opioid dependence ([place]) - Stable, seeing pain management  3. Lumbosacral radiculopathy at S1 (right-sided) - Seeing pain management and neurosurgery  4. Chronic low back pain (Primary Source of Pain) (R>L) - Seeing pain management and neurosurgery  5. Essential hypertension - Seeing cardiology annually, though no recent lipid panel is on file. Will check today. CMP was normal at last visit. - Lipid panel  6. Dyslipidemia - Seeing cardiology annually, though no recent lipid panel is on file. Will check today. CMP was normal at last visit. - Lipid panel  7. Adrenal insufficiency ([place]) - Continue seeing Endocrinology - Hemoglobin A1c  8. Hypothyroidism, unspecified type - Continue seeing Endocrinology  10. Avitaminosis D - Continue taking supplementation.  11. Class 2 severe obesity due to excess calories with serious comorbidity and body mass index (BMI) of 38.0 to 38.9 in adult [facility]) - See above regarding teaching.  He is on prednisone ongoing,  unable to exercise due to chronic back pain - Hemoglobin A1c  12. Obstructive sleep apnea syndrome - Continue CPAP  13. Atypical nevi - Ambulatory referral to Dermatology  14. Need for hepatitis C screening test - Hepatitis C antibody  15. Encounter for screening for HIV - HIV Antibody (routine testing w rflx)  16. Prostate cancer screening - PSA  17. Heme positive stool - Ambulatory referral to Gastroenterology - POCT Occult Blood Stool

## 2018-09-26 NOTE — Patient Instructions (Signed)
Please call to have your eye examination performed. [place]

## 2018-09-27 LAB — HIV ANTIBODY (ROUTINE TESTING W REFLEX): HIV 1&2 Ab, 4th Generation: NONREACTIVE

## 2018-09-27 LAB — LIPID PANEL
CHOL/HDL RATIO: 5.2 (calc) — AB (ref <5.0)
CHOLESTEROL: 230 mg/dL — AB (ref <200)
HDL: 44 mg/dL (ref >=40)
LDL CHOLESTEROL (CALC): 154 mg/dL — AB
NON-HDL CHOLESTEROL (CALC): 186 mg/dL — AB (ref <130)
Triglycerides: 183 mg/dL — ABNORMAL HIGH (ref <150)

## 2018-09-27 LAB — HEMOGLOBIN A1C W/OUT EAG: Hgb A1c MFr Bld: 5.5 % of total Hgb (ref <5.7)

## 2018-09-27 LAB — HEPATITIS C ANTIBODY
HEP C AB: NONREACTIVE
SIGNAL TO CUT-OFF: 0.01 (ref <1.00)

## 2018-09-27 LAB — PSA: PSA: 0.5 ng/mL (ref <=4.0)

## 2018-09-28 ENCOUNTER — Encounter: Payer: Self-pay | Admitting: Family Medicine

## 2018-09-28 DIAGNOSIS — M069 Rheumatoid arthritis, unspecified: Secondary | ICD-10-CM | POA: Insufficient documentation

## 2018-09-28 DIAGNOSIS — G5600 Carpal tunnel syndrome, unspecified upper limb: Secondary | ICD-10-CM | POA: Insufficient documentation

## 2018-09-28 DIAGNOSIS — E291 Testicular hypofunction: Secondary | ICD-10-CM | POA: Insufficient documentation

## 2018-09-29 ENCOUNTER — Encounter: Payer: Self-pay | Admitting: Family Medicine

## 2018-09-30 ENCOUNTER — Encounter: Payer: Self-pay | Admitting: *Deleted

## 2018-10-06 ENCOUNTER — Telehealth: Payer: Self-pay | Admitting: Gastroenterology

## 2018-10-06 NOTE — Telephone Encounter (Signed)
Pt is calling  To schedule a colonoscopy

## 2018-10-07 ENCOUNTER — Other Ambulatory Visit: Payer: Self-pay

## 2018-10-07 DIAGNOSIS — R195 Other fecal abnormalities: Secondary | ICD-10-CM

## 2018-10-07 MED ORDER — NA SULFATE-K SULFATE-MG SULF 17.5-3.13-1.6 GM/177ML PO SOLN: 1.0000 | Freq: Once | ORAL | 0 refills | Status: AC

## 2018-10-07 NOTE — Telephone Encounter (Signed)
LVM for pt to call office back regarding referral for colonoscopy.  Thanks [employer]

## 2018-10-10 ENCOUNTER — Encounter: Payer: Self-pay | Admitting: Family Medicine

## 2018-10-14 ENCOUNTER — Telehealth: Payer: Self-pay

## 2018-10-14 NOTE — Telephone Encounter (Signed)
Contacted patient informed him that we will need to reschedule his colonoscopy due to pending verification on insurance.  Informed him that I will call him back to reschedule his colonoscopy once we get the confirmation.  Thanks [employer]

## 2018-10-14 NOTE — Telephone Encounter (Signed)
Pt informed that we are in process of getting his insurance confirmation for his colonoscopy on 10/17/18 and MO will contact him as soon as we know something.

## 2018-10-17 ENCOUNTER — Encounter: Admission: RE | Payer: Self-pay | Source: Home / Self Care

## 2018-10-17 ENCOUNTER — Ambulatory Visit
Admission: RE | Admit: 2018-10-17 | Payer: No Typology Code available for payment source | Source: Home / Self Care | Admitting: Gastroenterology

## 2018-10-17 SURGERY — COLONOSCOPY WITH PROPOFOL
Anesthesia: General

## 2018-10-20 ENCOUNTER — Other Ambulatory Visit: Payer: Self-pay

## 2018-10-20 ENCOUNTER — Encounter: Payer: Self-pay | Admitting: Nurse Practitioner

## 2018-10-20 ENCOUNTER — Ambulatory Visit: Payer: No Typology Code available for payment source | Attending: Nurse Practitioner | Admitting: Nurse Practitioner

## 2018-10-20 ENCOUNTER — Telehealth: Payer: Self-pay | Admitting: Gastroenterology

## 2018-10-20 VITALS — BP 140/96 | HR 79 | Temp 98.7°F | Ht 74.0 in | Wt 300.0 lb

## 2018-10-20 DIAGNOSIS — M47816 Spondylosis without myelopathy or radiculopathy, lumbar region: Secondary | ICD-10-CM | POA: Diagnosis not present

## 2018-10-20 DIAGNOSIS — G894 Chronic pain syndrome: Secondary | ICD-10-CM | POA: Diagnosis not present

## 2018-10-20 DIAGNOSIS — M5136 Other intervertebral disc degeneration, lumbar region: Secondary | ICD-10-CM | POA: Diagnosis not present

## 2018-10-20 DIAGNOSIS — R195 Other fecal abnormalities: Secondary | ICD-10-CM

## 2018-10-20 DIAGNOSIS — M7918 Myalgia, other site: Secondary | ICD-10-CM | POA: Diagnosis present

## 2018-10-20 DIAGNOSIS — Z79891 Long term (current) use of opiate analgesic: Secondary | ICD-10-CM | POA: Diagnosis present

## 2018-10-20 DIAGNOSIS — G8929 Other chronic pain: Secondary | ICD-10-CM | POA: Diagnosis present

## 2018-10-20 DIAGNOSIS — M51369 Other intervertebral disc degeneration, lumbar region without mention of lumbar back pain or lower extremity pain: Secondary | ICD-10-CM

## 2018-10-20 DIAGNOSIS — M1611 Unilateral primary osteoarthritis, right hip: Secondary | ICD-10-CM

## 2018-10-20 MED ORDER — BACLOFEN 10 MG PO TABS: 10.0000 mg | ORAL_TABLET | Freq: Three times a day (TID) | ORAL | 0 refills | Status: DC

## 2018-10-20 MED ORDER — TRAMADOL HCL 50 MG PO TABS: 50.0000 mg | ORAL_TABLET | Freq: Four times a day (QID) | ORAL | 5 refills | Status: DC | PRN

## 2018-10-20 NOTE — Telephone Encounter (Signed)
Patient's wife called & l/m  Stating  patient had a colonoscopy scheduled on 10-17-18. They received call that the insurance would not authorize the procedure. They have not heard anything since

## 2018-10-20 NOTE — Telephone Encounter (Signed)
Patient has been contacted to reschedule colonoscopy due to entry error in referral coding. I apologized to him for the inconvenience that his has caused him.  Contacted patients wife to make sure that this date is good and she is good with this date.  He has been rescheduled to 11/09/18 with Dr. Bonna Gains at [facility].  Thanks [employer]

## 2018-10-20 NOTE — Progress Notes (Signed)
Nursing Pain Medication Assessment:  Safety precautions to be maintained throughout the outpatient stay will include: orient to surroundings, keep bed in low position, maintain call bell within reach at all times, provide assistance with transfer out of bed and ambulation.  Medication Inspection Compliance: Pill count conducted under aseptic conditions, in front of the patient. Neither the pills nor the bottle was removed from the patient's sight at any time. Once count was completed pills were immediately returned to the patient in their original bottle.  Medication: Tramadol (Ultram) Pill/Patch Count: 19 of 240 pills remain Pill/Patch Appearance: Markings consistent with prescribed medication Bottle Appearance: Standard pharmacy container. Clearly labeled. Filled Date: 2 / 10 / 2020 Last Medication intake:  Today

## 2018-10-20 NOTE — Progress Notes (Signed)
Patient's Name: Dean Ford  MRN: [id number]  Referring Provider: No ref. provider found  DOB: October 25, 1975  PCP: Hubbard Hartshorn, FNP  DOS: 10/20/2018  Note by: Dionisio David, NP  Service setting: Ambulatory outpatient  Specialty: Interventional Pain Management  Location: [facility] (AMB) Pain Management Facility    Patient type: Established   HPI  Reason for Visit: [name] is a 43 y.o. year old, male patient, who comes today with a chief complaint of Back Pain Last Appointment: His last appointment at our practice was on 06/10/2018. I last saw him on 04/25/2018.  Pain Assessment: Today, [name] describes the severity of the Chronic pain as a 7 /10. He indicates the location/referral of the pain to be Back Lower/Denies. Onset was: More than a month ago. The quality of pain is described as Burning, Dull, Spasm. Temporal description, or timing of pain is: Constant. Possible modifying factors: sitting and reclining, medications. [name]  height is 6' 2"  (1.88 m) and weight is 300 lb (136.1 kg). His temperature is 98.7 F (37.1 C). His blood pressure is 140/96 (abnormal) and his pulse is 79. His oxygen saturation is 98%.  He admits that he a microdiscectomy for the 3 rd time.Dean Ford He admits that this was his 3rd surgery, with Dr Trenton Gammon. He admits that there was no precipatationg factors. He admits that he has not been back to work yet. He feels like there is tightness in his muscles. He admits that the muscle relaxer, Robaxin is not effective.   Controlled Substance Pharmacotherapy Assessment REMS (Risk Evaluation and Mitigation Strategy)  Analgesic:Tramadol 100 mg every 6 hours (400 mg/day of tramadol)  MME/day:40 mg/day Dean Fischer, RN  10/20/2018  8:50 AM  Sign when Signing Visit Nursing Pain Medication Assessment:  Safety precautions to be maintained throughout the outpatient stay will include: orient to surroundings, keep bed in low position, maintain call bell within reach at all times, provide  assistance with transfer out of bed and ambulation.  Medication Inspection Compliance: Pill count conducted under aseptic conditions, in front of the patient. Neither the pills nor the bottle was removed from the patient's sight at any time. Once count was completed pills were immediately returned to the patient in their original bottle.  Medication: Tramadol (Ultram) Pill/Patch Count: 19 of 240 pills remain Pill/Patch Appearance: Markings consistent with prescribed medication Bottle Appearance: Standard pharmacy container. Clearly labeled. Filled Date: 2 / 10 / 2020 Last Medication intake:  Today   Pharmacokinetics: Liberation and absorption (onset of action): WNL Distribution (time to peak effect): WNL Metabolism and excretion (duration of action): WNL         Pharmacodynamics: Desired effects: Analgesia: Dean Ford reports >50% benefit. Functional ability: Patient reports that medication allows him to accomplish basic ADLs Clinically meaningful improvement in function (CMIF): Sustained CMIF goals met Perceived effectiveness: Described as relatively effective, allowing for increase in activities of daily living (ADL) Undesirable effects: Side-effects or Adverse reactions: None reported Monitoring: [place] PMP: Online review of the past 30-monthperiod conducted. Non-compliant   Concerns regarding the patients refills on his Tramadol. It was last filled on 09/26/2018 however this is was filled one week early than it should have been based on the last filled date of 09/02/2018. This has happened on several occasions. He has refilled the Tramadol on 05/16/2018; 10/25; 11/19; 12/19 and 09/02/2018. This has made his prescription refilled in advance making his refill date almost 20 days earlier than it should be. I have  spoken with pharmacy and they are making a note to make sure that this does not occur again. His refill date should be 11/10/2018. However since the Prescription indicates 09/26/2018. I have  agreed to allow it to be filled on 10/26/2018 which is 30 days from the last fill date.   Last UDS on record: Summary  Date Value Ref Range Status  04/25/2018 FINAL  Final    Comment:    ==================================================================== TOXASSURE SELECT 13 (MW) ==================================================================== Test                             Result       Flag       Units Drug Present and Declared for Prescription Verification   Tramadol                       >1558        EXPECTED   ng/mg creat   O-Desmethyltramadol            >1558        EXPECTED   ng/mg creat   N-Desmethyltramadol            917          EXPECTED   ng/mg creat    Source of tramadol is a prescription medication.    O-desmethyltramadol and N-desmethyltramadol are expected    metabolites of tramadol. ==================================================================== Test                      Result    Flag   Units      Ref Range   Creatinine              321              mg/dL      >=20 ==================================================================== Declared Medications:  The flagging and interpretation on this report are based on the  following declared medications.  Unexpected results may arise from  inaccuracies in the declared medications.  **Note: The testing scope of this panel includes these medications:  Tramadol (Ultram)  **Note: The testing scope of this panel does not include following  reported medications:  Carvedilol (Coreg)  Fludrocortisone (Florinef)  Hydrocortisone (Cortef)  Hydroxychloroquine (Plaquenil)  Levothyroxine  Methocarbamol (Robaxin)  Prednisone (Deltasone)  Sildenafil (Viagra)  Testosterone (Depo-Testosterone)  Valsartan (Diovan)  Vitamin D2 (Drisdol) ==================================================================== For clinical consultation, please call [phone]. ====================================================================    UDS interpretation: Compliant          Medication Assessment Form: Reviewed. Patient indicates being compliant with therapy Treatment compliance: Compliant Risk Assessment Profile: Aberrant behavior: See initial evaluations. None observed or detected today Comorbid factors increasing risk of overdose: See initial evaluation. No additional risks detected today Opioid risk tool (ORT):  Opioid Risk  10/20/2018  Alcohol 0  Illegal Drugs 0  Rx Drugs 0  Alcohol 0  Illegal Drugs 0  Rx Drugs 0  Age between 16-45 years  0  History of Preadolescent Sexual Abuse 0  Psychological Disease 0  Depression 0  Opioid Risk Tool Scoring 0  Opioid Risk Interpretation Low Risk    ORT Scoring interpretation table:  Score <3 = Low Risk for SUD  Score between 4-7 = Moderate Risk for SUD  Score >8 = High Risk for Opioid Abuse   Risk of substance use disorder (SUD): Low  Risk Mitigation Strategies:  Patient Counseling: Covered Patient-Prescriber Agreement (  PPA): Present and active  Notification to other healthcare providers: Done  Pharmacologic Plan: No change in therapy, at this time.             ROS  Constitutional: Denies any fever or chills Gastrointestinal: No reported hemesis, hematochezia, vomiting, or acute GI distress Musculoskeletal: Denies any acute onset joint swelling, redness, loss of ROM, or weakness Neurological: No reported episodes of acute onset apraxia, aphasia, dysarthria, agnosia, amnesia, paralysis, loss of coordination, or loss of consciousness  Medication Review  Vitamin D (Ergocalciferol), baclofen, carvedilol, fludrocortisone, hydrocortisone, hydroxychloroquine, levothyroxine, methocarbamol, predniSONE, sildenafil, testosterone cypionate, traMADol, and valsartan  History Review  Allergy: Mr. Zuver is allergic to borax. Drug: Mr. Mines  reports no history of drug use. Alcohol:  reports  current alcohol use. Tobacco:  reports that he quit smoking about 22 years ago. He quit smokeless tobacco use about 22 years ago. Social: Mr. Mahler  reports that he quit smoking about 22 years ago. He quit smokeless tobacco use about 22 years ago. He reports current alcohol use. He reports that he does not use drugs. Medical:  has a past medical history of Adrenal insufficiency ([place]), Anxiety, Anxiety disorder (01/28/2015), Arthritis, Back pain, chronic (06/03/2015), Hypertension, Hypothyroid, Sleep apnea, and Thyroid disease. Surgical: Mr. Bosket  has a past surgical history that includes Elbow surgery and Back surgery. Family: family history includes Heart attack (age of onset: 68) in his father; Hypertension in his father and sister; Osteoarthritis in his mother. Problem List: Mr. Serpa has Lumbar spondylosis; Lumbar facet syndrome (Bilateral) (R>L); Intermittent muscle cramps; Lumbar spine pain; DDD (degenerative disc disease), lumbar; L4-L5 disc bulge; Ligamentum flavum hypertrophy (HCC); Bulging lumbar disc; Chronic sacroiliac joint pain (Bilateral) (R>L); Lumbar facet hypertrophy; Lumbosacral radiculopathy at S1 (right-sided); Chronic pain; Chronic low back pain (Primary Source of Pain) (R>L); Spasm of paraspinal muscle; Lower extremity numbness (Bilateral) (R>L); Chronic lumbar radicular pain (Right:S1, Bilateral:L5) (Bilateral) (R>L); Chronic hip pain (Right); Osteoarthritis of hip (Right); Acute lumbar radiculopathy (Right) (S1); and 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  Lab Review  Kidney Function Lab Results  Component Value Date   BUN 12 06/23/2018   CREATININE 0.88 25/95/6387   BCR NOT APPLICABLE 56/43/3295   GFRAA 122 06/23/2018   GFRNONAA 105 06/23/2018  Liver Function Lab Results  Component Value Date   AST 19 06/23/2018   ALT 40 06/23/2018   ALBUMIN 4.8 03/15/2018  Note: Above Lab results  reviewed.  Imaging Review  MR Lumbar Spine W Wo Contrast CLINICAL DATA:  Acute right-sided back pain radiating down the right leg for the past 2 days. Prior lumbar discectomies.  EXAM: MRI LUMBAR SPINE WITHOUT AND WITH CONTRAST  TECHNIQUE: Multiplanar and multiecho pulse sequences of the lumbar spine were obtained without and with intravenous contrast.  CONTRAST:  10 mL Gadavist intravenous contrast.  COMPARISON:  MRI lumbar spine dated October 15, 2016.  FINDINGS: Segmentation:  Standard.  Alignment: Unchanged 2 mm retrolisthesis at L4-L5. Unchanged 5 mm retrolisthesis at L5-S1.  Vertebrae: No fracture, evidence of discitis, or bone lesion. Chronic fatty endplate marrow changes at L5-S1.  Conus medullaris and cauda equina: Conus extends to the L1 level. Conus and cauda equina appear normal. No intradural enhancement.  Paraspinal and other soft tissues: Negative.  Disc levels:  T12-L1:  Negative.  L1-L2:  Negative.  L2-L3:  Normal disc.  Mild facet arthropathy.  No stenosis.  L3-L4:  Normal disc.  Mild facet arthropathy.  No stenosis.  L4-L5: Unchanged mild diffuse disc bulge with superimposed left foraminal and far lateral disc protrusion. Unchanged mild left greater than right neuroforaminal stenosis.  L5-S1: Prior right laminectomy. Right paracentral and foraminal disc protrusion impinging on the descending right S1 nerve root. Mild bilateral facet arthropathy. Unchanged severe right neuroforaminal stenosis. No spinal canal or left neuroforaminal stenosis.  IMPRESSION: 1. Right paracentral and foraminal disc protrusion at L5-S1 with unchanged impingement of the descending right S1 nerve root. Unchanged severe right neuroforaminal stenosis likely affecting the exiting right L5 nerve root. 2. Unchanged mild degenerative disc disease at L4-L5.  Electronically Signed   By: Titus Dubin M.D.   On: 06/10/2018 12:27 Note: Reviewed        Physical Exam   General appearance: alert, cooperative, in no distress and morbidly obese Mental status: Alert, oriented x 3 (person, place, & time)       Respiratory: No evidence of acute respiratory distress Eyes: PERLA Vitals: BP (!) 140/96   Pulse 79   Temp 98.7 F (37.1 C)   Ht 6' 2"  (1.88 m)   Wt 300 lb (136.1 kg)   SpO2 98%   BMI 38.52 kg/m  BMI: Estimated body mass index is 38.52 kg/m as calculated from the following:   Height as of this encounter: 6' 2"  (1.88 m).   Weight as of this encounter: 300 lb (136.1 kg). Ideal: Ideal body weight: 82.2 kg (181 lb 3.5 oz) Adjusted ideal body weight: 103.8 kg (228 lb 11.7 oz) Lumbar Spine Area Exam  Skin & Axial Inspection: Well healed scar from previous spine surgery detected Alignment: Symmetrical Functional ROM: Unrestricted ROM       Stability: No instability detected Muscle Tone/Strength: Functionally intact. No obvious neuro-muscular anomalies detected. Sensory (Neurological): Unimpaired Palpation: Non-tender       Gait & Posture Assessment  Ambulation: Unassisted Gait: Relatively normal for age and body habitus Posture: WNL  Lower Extremity Exam    Side: Right lower extremity  Side: Left lower extremity  Stability: No instability observed          Stability: No instability observed          Skin & Extremity Inspection: Skin color, temperature, and hair growth are WNL. No peripheral edema or cyanosis. No masses, redness, swelling, asymmetry, or associated skin lesions. No contractures.  Skin & Extremity Inspection: Skin color, temperature, and hair growth are WNL. No peripheral edema or cyanosis. No masses, redness, swelling, asymmetry, or associated skin lesions. No contractures.  Functional ROM: Unrestricted ROM                  Functional ROM: Unrestricted ROM                  Muscle Tone/Strength: Functionally intact. No obvious neuro-muscular anomalies detected.  Muscle Tone/Strength: Functionally intact. No obvious neuro-muscular  anomalies detected.  Sensory (Neurological): Unimpaired        Sensory (Neurological): Unimpaired            Palpation: No palpable anomalies  Palpation: No palpable anomalies    Assessment   Status Diagnosis  Controlled Controlled Controlled 1. Lumbar spondylosis   2. Chronic musculoskeletal pain   3. Chronic pain syndrome   4. DDD (degenerative disc disease), lumbar   5. Primary osteoarthritis of right hip   6. Long term prescription opiate use      Updated Problems: No problems updated.  Plan of Care  Pharmacotherapy (Medications Ordered): Meds ordered this encounter  Medications  .  traMADol (ULTRAM) 50 MG tablet    Sig: Take 1-2 tablets (50-100 mg total) by mouth every 6 (six) hours as needed for severe pain.    Dispense:  240 tablet    Refill:  5    Do not refill any sooner than every 30 days.    Order Specific Question:   Supervising Provider    Answer:   Gillis Santa [[id number]]  . baclofen (LIORESAL) 10 MG tablet    Sig: Take 1 tablet (10 mg total) by mouth 3 (three) times daily.    Dispense:  90 tablet    Refill:  0    Do not place this medication, or any other prescription from our practice, on "Automatic Refill". Patient may have prescription filled one day early if pharmacy is closed on scheduled refill date.    Order Specific Question:   Supervising Provider    Answer:   Milinda Pointer [[id number]]   Administered today: [name] had no medications administered during this visit.  Orders:  Orders Placed This Encounter  Procedures  . ToxASSURE Select 13 (MW), Urine    Volume: 30 ml(s). Minimum 3 ml of urine is needed. Document temperature of fresh sample. Indications: Long term (current) use of opiate analgesic (Z79.891)  . Ambulatory referral to Physical Therapy    Referral Priority:   Routine    Referral Type:   Physical Medicine    Referral Reason:   Specialty Services Required    Requested Specialty:   Physical Therapy    Number of Visits  Requested:   1   Follow-up plan:   Return in about 4 weeks (around 11/17/2018) for MedMgmt..  Again spoke with pharmacy in reference to tramadol refills.  A note has been placed in his chart to indicate no early refills.  Will have a long discussion again with the patient at his 4-week follow-up.   Interventional options: Considering:  Caudal epidural steroid injection  Lumbar epidural steroid injection  Rightlumbar facet block  Possible lumbar facet radiofrequency ablation.    Palliative PRN treatment(s):  Caudal epidural steroid injection Right L5-S1 lumbar epidural steroid injection  Palliative rightlumbar facet block  Right lumbar facet RFA Right-sided sacroiliac joint block  Right-sided intra-articular hip joint injection Lumbar Trigger Point injections    Note by: Dionisio David, NP Date: 10/20/2018; Time: 1:37 PM

## 2018-10-20 NOTE — Patient Instructions (Signed)
____________________________________________________________________________________________  Medication Rules  Purpose: To inform patients, and their family members, of our rules and regulations.  Applies to: All patients receiving prescriptions (written or electronic).  Pharmacy of record: Pharmacy where electronic prescriptions will be sent. If written prescriptions are taken to a different pharmacy, please inform the nursing staff. The pharmacy listed in the electronic medical record should be the one where you would like electronic prescriptions to be sent.  Electronic prescriptions: In compliance with the [place] Strengthen Opioid Misuse Prevention (STOP) Act of 2017 (Session Law 2017-74/H243), effective August 17, 2018, all controlled substances must be electronically prescribed. Calling prescriptions to the pharmacy will cease to exist.  Prescription refills: Only during scheduled appointments. Applies to all prescriptions.  NOTE: The following applies primarily to controlled substances (Opioid* Pain Medications).   Patient's responsibilities: 1. Pain Pills: Bring all pain pills to every appointment (except for procedure appointments). 2. Pill Bottles: Bring pills in original pharmacy bottle. Always bring the newest bottle. Bring bottle, even if empty. 3. Medication refills: You are responsible for knowing and keeping track of what medications you take and those you need refilled. The day before your appointment: write a list of all prescriptions that need to be refilled. The day of the appointment: give the list to the admitting nurse. Prescriptions will be written only during appointments. No prescriptions will be written on procedure days. If you forget a medication: it will not be "Called in", "Faxed", or "electronically sent". You will need to get another appointment to get these prescribed. No early refills. Do not call asking to have your prescription filled  early. 4. Prescription Accuracy: You are responsible for carefully inspecting your prescriptions before leaving our office. Have the discharge nurse carefully go over each prescription with you, before taking them home. Make sure that your name is accurately spelled, that your address is correct. Check the name and dose of your medication to make sure it is accurate. Check the number of pills, and the written instructions to make sure they are clear and accurate. Make sure that you are given enough medication to last until your next medication refill appointment. 5. Taking Medication: Take medication as prescribed. When it comes to controlled substances, taking less pills or less frequently than prescribed is permitted and encouraged. Never take more pills than instructed. Never take medication more frequently than prescribed.  6. Inform other Doctors: Always inform, all of your healthcare providers, of all the medications you take. 7. Pain Medication from other Providers: You are not allowed to accept any additional pain medication from any other Doctor or Healthcare provider. There are two exceptions to this rule. (see below) In the event that you require additional pain medication, you are responsible for notifying us, as stated below. 8. Medication Agreement: You are responsible for carefully reading and following our Medication Agreement. This must be signed before receiving any prescriptions from our practice. Safely store a copy of your signed Agreement. Violations to the Agreement will result in no further prescriptions. (Additional copies of our Medication Agreement are available upon request.) 9. Laws, Rules, & Regulations: All patients are expected to follow all Federal and State Laws, Statutes, Rules, & Regulations. Ignorance of the Laws does not constitute a valid excuse. The use of any illegal substances is prohibited. 10. Adopted CDC guidelines & recommendations: Target dosing levels will be  at or below 60 MME/day. Use of benzodiazepines** is not recommended.  Exceptions: There are only two exceptions to the rule of not   receiving pain medications from other Healthcare Providers. 1. Exception #1 (Emergencies): In the event of an emergency (i.e.: accident requiring emergency care), you are allowed to receive additional pain medication. However, you are responsible for: As soon as you are able, call our office [phone], at any time of the day or night, and leave a message stating your name, the date and nature of the emergency, and the name and dose of the medication prescribed. In the event that your call is answered by a member of our staff, make sure to document and save the date, time, and the name of the person that took your information.  2. Exception #2 (Planned Surgery): In the event that you are scheduled by another doctor or dentist to have any type of surgery or procedure, you are allowed (for a period no longer than 30 days), to receive additional pain medication, for the acute post-op pain. However, in this case, you are responsible for picking up a copy of our "Post-op Pain Management for Surgeons" handout, and giving it to your surgeon or dentist. This document is available at our office, and does not require an appointment to obtain it. Simply go to our office during business hours (Monday-Thursday from 8:00 AM to 4:00 PM) (Friday 8:00 AM to 12:00 Noon) or if you have a scheduled appointment with us, prior to your surgery, and ask for it by name. In addition, you will need to provide us with your name, name of your surgeon, type of surgery, and date of procedure or surgery.  *Opioid medications include: morphine, codeine, oxycodone, oxymorphone, hydrocodone, hydromorphone, meperidine, tramadol, tapentadol, buprenorphine, fentanyl, methadone. **Benzodiazepine medications include: diazepam (Valium), alprazolam (Xanax), clonazepam (Klonopine), lorazepam (Ativan), clorazepate  (Tranxene), chlordiazepoxide (Librium), estazolam (Prosom), oxazepam (Serax), temazepam (Restoril), triazolam (Halcion) (Last updated: 10/14/2017) ____________________________________________________________________________________________    

## 2018-10-28 LAB — TOXASSURE SELECT 13 (MW), URINE

## 2018-11-03 ENCOUNTER — Telehealth: Payer: Self-pay

## 2018-11-03 NOTE — Telephone Encounter (Signed)
Patient has been notified that due to recent restrictions with COVID 19 unfortunately we have to cancel his colonoscopy scheduled for 11/09/18 and we will reschedule just as soon as we are able to do so.  Thanks [employer]

## 2018-11-09 ENCOUNTER — Ambulatory Visit
Admission: RE | Admit: 2018-11-09 | Payer: No Typology Code available for payment source | Source: Home / Self Care | Admitting: Gastroenterology

## 2018-11-09 ENCOUNTER — Encounter: Admission: RE | Payer: Self-pay | Source: Home / Self Care

## 2018-11-09 SURGERY — COLONOSCOPY WITH PROPOFOL
Anesthesia: General

## 2018-11-17 ENCOUNTER — Other Ambulatory Visit: Payer: Self-pay

## 2018-11-17 ENCOUNTER — Ambulatory Visit: Payer: No Typology Code available for payment source | Attending: Nurse Practitioner | Admitting: Nurse Practitioner

## 2018-11-17 DIAGNOSIS — M5136 Other intervertebral disc degeneration, lumbar region: Secondary | ICD-10-CM | POA: Diagnosis not present

## 2018-11-17 DIAGNOSIS — G894 Chronic pain syndrome: Secondary | ICD-10-CM

## 2018-11-17 DIAGNOSIS — M47816 Spondylosis without myelopathy or radiculopathy, lumbar region: Secondary | ICD-10-CM | POA: Diagnosis not present

## 2018-11-17 DIAGNOSIS — M7918 Myalgia, other site: Secondary | ICD-10-CM

## 2018-11-17 DIAGNOSIS — M51369 Other intervertebral disc degeneration, lumbar region without mention of lumbar back pain or lower extremity pain: Secondary | ICD-10-CM

## 2018-11-17 DIAGNOSIS — G8929 Other chronic pain: Secondary | ICD-10-CM

## 2018-11-17 MED ORDER — BACLOFEN 10 MG PO TABS: 10.0000 mg | ORAL_TABLET | Freq: Three times a day (TID) | ORAL | 2 refills | Status: DC

## 2018-11-17 NOTE — Progress Notes (Addendum)
Pain Management Encounter Note - Virtual Visit via Telephone Telehealth (real-time audio visits between healthcare provider and patient).  Patient's Phone No. & Preferred Pharmacy:  [phone] (home); [phone] (mobile); (Preferred) [phone]  [place] [address] [place] [address] Phone: [phone] Fax: [phone]   Pre-screening note:  Our staff contacted Dean Ford and offered him an "in person", "face-to-face" appointment versus a telephone encounter. He indicated preferring the telephone encounter, at this time.  Reason for Virtual Visit: COVID-19*  Social distancing based on CDC and AMA recommendations.   I contacted Dean Ford on 11/17/2018 at 9:15 AM by telephone and clearly identified myself as Dean David, NP. I verified that I was speaking with the correct person using two identifiers (Name and date of birth: 1975/11/20).  Advanced Informed Consent I sought verbal advanced consent from Dean Ford for telemedicine interactions and virtual visit. I informed Dean Ford of the security and privacy concerns, risks, and limitations associated with performing an evaluation and management service by telephone. I also informed Dean Ford of the availability of "in person" appointments and I informed him of the possibility of a patient responsible charge related to this service. Dean Ford expressed understanding and agreed to proceed.   Historic Elements   Dean Ford is a 43 y.o. year old, male patient evaluated today after his last encounter by our practice on 10/20/2018. Dean Ford  has a past medical history of Adrenal insufficiency ([place]), Anxiety, Anxiety disorder (01/28/2015), Arthritis, Back pain, chronic (06/03/2015), Hypertension, Hypothyroid, Sleep apnea, and Thyroid disease. He also  has a past surgical history that includes Elbow surgery and Back surgery. Dean Ford has a current medication list which  includes the following prescription(s): baclofen, carvedilol, fludrocortisone, hydrocortisone, hydroxychloroquine, levothyroxine, prednisone, sildenafil, testosterone cypionate, tramadol, valsartan, and vitamin d (ergocalciferol). He  reports that he quit smoking about 22 years ago. He quit smokeless tobacco use about 22 years ago. He reports current alcohol use. He reports that he does not use drugs. Dean Ford is allergic to borax.   HPI  I last saw him on 10/20/2018. He is being evaluated for medication management.  He rates his lower back pain 2/10 with sitting. He admits that it radiates across. His pain goes into he buttocks mostly on the right. He may have some weakness or "jittering feeling " in his legs especially with going up stairs and standing. He admits that the Baclofen has been effective for his pain.    Pharmacotherapy Assessment  Analgesic: Tramadol 50-100 mg every 6 hours MME/day: 40 mg/day.   Monitoring: Pharmacotherapy: No side-effects or adverse reactions reported. [place] PMP: PDMP not reviewed this encounter.       Compliance: No problems identified. Plan: Refer to "POC".  Review of recent tests  MR Lumbar Spine W Wo Contrast CLINICAL DATA:  Acute right-sided back pain radiating down the right leg for the past 2 days. Prior lumbar discectomies.  EXAM: MRI LUMBAR SPINE WITHOUT AND WITH CONTRAST  TECHNIQUE: Multiplanar and multiecho pulse sequences of the lumbar spine were obtained without and with intravenous contrast.  CONTRAST:  10 mL Gadavist intravenous contrast.  COMPARISON:  MRI lumbar spine dated October 15, 2016.  FINDINGS: Segmentation:  Standard.  Alignment: Unchanged 2 mm retrolisthesis at L4-L5. Unchanged 5 mm retrolisthesis at L5-S1.  Vertebrae: No fracture, evidence of discitis, or bone lesion. Chronic fatty endplate marrow changes at L5-S1.  Conus medullaris and cauda  equina: Conus extends to the L1 level. Conus and cauda equina appear normal. No  intradural enhancement.  Paraspinal and other soft tissues: Negative.  Disc levels:  T12-L1:  Negative.  L1-L2:  Negative.  L2-L3:  Normal disc.  Mild facet arthropathy.  No stenosis.  L3-L4:  Normal disc.  Mild facet arthropathy.  No stenosis.  L4-L5: Unchanged mild diffuse disc bulge with superimposed left foraminal and far lateral disc protrusion. Unchanged mild left greater than right neuroforaminal stenosis.  L5-S1: Prior right laminectomy. Right paracentral and foraminal disc protrusion impinging on the descending right S1 nerve root. Mild bilateral facet arthropathy. Unchanged severe right neuroforaminal stenosis. No spinal canal or left neuroforaminal stenosis.  IMPRESSION: 1. Right paracentral and foraminal disc protrusion at L5-S1 with unchanged impingement of the descending right S1 nerve root. Unchanged severe right neuroforaminal stenosis likely affecting the exiting right L5 nerve root. 2. Unchanged mild degenerative disc disease at L4-L5.  Electronically Signed   By: Titus Dubin M.D.   On: 06/10/2018 12:27   Office Visit on 10/20/2018  Component Date Value Ref Range Status  . Summary 10/20/2018 FINAL   Final   Comment: ==================================================================== TOXASSURE SELECT 13 (MW) ==================================================================== Test                             Result       Flag       Units Drug Present and Declared for Prescription Verification   Tramadol                       >2513        EXPECTED   ng/mg creat   O-Desmethyltramadol            >2513        EXPECTED   ng/mg creat   N-Desmethyltramadol            1203         EXPECTED   ng/mg creat    Source of tramadol is a prescription medication.    O-desmethyltramadol and N-desmethyltramadol are expected    metabolites of tramadol. ==================================================================== Test                      Result    Flag    Units      Ref Range   Creatinine              199              mg/dL      >=20 ==================================================================== Declared Medications:  The flagging and interpretation on this report are based on the  follo                          wing declared medications.  Unexpected results may arise from  inaccuracies in the declared medications.  **Note: The testing scope of this panel includes these medications:  Tramadol  **Note: The testing scope of this panel does not include following  reported medications:  Baclofen  Carvedilol  Fludrocortisone  Hydrocortisone  Hydroxychloroquine  Levothyroxine  Methocarbamol  Prednisolone  Sildenafil  Testosterone  Valsartan  Vitamin D2 (Ergocalciferol) ==================================================================== For clinical consultation, please call [phone]. ====================================================================    Assessment  The primary encounter diagnosis was Lumbar spondylosis. Diagnoses of DDD (degenerative disc disease), lumbar, Chronic musculoskeletal pain, and Chronic pain syndrome were also pertinent to this  visit.  Plan of Care  I discussed the assessment and treatment plan with the patient. The patient was provided an opportunity to ask questions and all were answered. The patient agreed with the plan and demonstrated an understanding of the instructions.  Patient advised to call back or seek an in-person evaluation if the symptoms or condition worsens.  I am having Dean Ford maintain his hydrocortisone, testosterone cypionate, fludrocortisone, Vitamin D (Ergocalciferol), hydroxychloroquine, levothyroxine, sildenafil, valsartan, carvedilol, predniSONE, traMADol, and baclofen. Pharmacotherapy (Medications Ordered): Meds ordered this encounter  Medications  . baclofen (LIORESAL) 10 MG tablet    Sig: Take 1 tablet (10 mg total) by mouth 3 (three) times daily.     Dispense:  90 tablet    Refill:  2    Do not place this medication, or any other prescription from our practice, on "Automatic Refill". Patient may have prescription filled one day early if pharmacy is closed on scheduled refill date.    Order Specific Question:   Supervising Provider    Answer:   Milinda Pointer [phone]   Orders:  Orders Placed This Encounter  Procedures  . Ambulatory referral to Neurosurgery    Referral Priority:   Routine    Referral Type:   Surgical    Referral Reason:   Specialty Services Required    Referred to Provider:   Deetta Perla, MD    Requested Specialty:   Neurosurgery    Number of Visits Requested:   1   Follow-up plan:   Return in about 2 months (around 01/17/2019) for MedMgmt.   Total duration of non-face-to-face encounter: 12 minutes.  Note by: Dean David, NP Date: 11/17/2018;  Disclaimer:  * Given the special circumstances of the COVID-19 pandemic, the federal government has announced that the Office for Civil Rights (OCR) will exercise its enforcement discretion and will not impose penalties on physicians using telehealth in the event of noncompliance with regulatory requirements under the [place] (HIPAA) in connection with the good faith provision of telehealth during the TKWIO-97 national public health emergency. ([place])

## 2018-11-17 NOTE — Patient Instructions (Signed)
____________________________________________________________________________________________  Medication Rules  Purpose: To inform patients, and their family members, of our rules and regulations.  Applies to: All patients receiving prescriptions (written or electronic).  Pharmacy of record: Pharmacy where electronic prescriptions will be sent. If written prescriptions are taken to a different pharmacy, please inform the nursing staff. The pharmacy listed in the electronic medical record should be the one where you would like electronic prescriptions to be sent.  Electronic prescriptions: In compliance with the [place] Strengthen Opioid Misuse Prevention (STOP) Act of 2017 (Session Law 2017-74/H243), effective August 17, 2018, all controlled substances must be electronically prescribed. Calling prescriptions to the pharmacy will cease to exist.  Prescription refills: Only during scheduled appointments. Applies to all prescriptions.  NOTE: The following applies primarily to controlled substances (Opioid* Pain Medications).   Patient's responsibilities: 1. Pain Pills: Bring all pain pills to every appointment (except for procedure appointments). 2. Pill Bottles: Bring pills in original pharmacy bottle. Always bring the newest bottle. Bring bottle, even if empty. 3. Medication refills: You are responsible for knowing and keeping track of what medications you take and those you need refilled. The day before your appointment: write a list of all prescriptions that need to be refilled. The day of the appointment: give the list to the admitting nurse. Prescriptions will be written only during appointments. No prescriptions will be written on procedure days. If you forget a medication: it will not be "Called in", "Faxed", or "electronically sent". You will need to get another appointment to get these prescribed. No early refills. Do not call asking to have your prescription filled  early. 4. Prescription Accuracy: You are responsible for carefully inspecting your prescriptions before leaving our office. Have the discharge nurse carefully go over each prescription with you, before taking them home. Make sure that your name is accurately spelled, that your address is correct. Check the name and dose of your medication to make sure it is accurate. Check the number of pills, and the written instructions to make sure they are clear and accurate. Make sure that you are given enough medication to last until your next medication refill appointment. 5. Taking Medication: Take medication as prescribed. When it comes to controlled substances, taking less pills or less frequently than prescribed is permitted and encouraged. Never take more pills than instructed. Never take medication more frequently than prescribed.  6. Inform other Doctors: Always inform, all of your healthcare providers, of all the medications you take. 7. Pain Medication from other Providers: You are not allowed to accept any additional pain medication from any other Doctor or Healthcare provider. There are two exceptions to this rule. (see below) In the event that you require additional pain medication, you are responsible for notifying us, as stated below. 8. Medication Agreement: You are responsible for carefully reading and following our Medication Agreement. This must be signed before receiving any prescriptions from our practice. Safely store a copy of your signed Agreement. Violations to the Agreement will result in no further prescriptions. (Additional copies of our Medication Agreement are available upon request.) 9. Laws, Rules, & Regulations: All patients are expected to follow all Federal and State Laws, Statutes, Rules, & Regulations. Ignorance of the Laws does not constitute a valid excuse. The use of any illegal substances is prohibited. 10. Adopted CDC guidelines & recommendations: Target dosing levels will be  at or below 60 MME/day. Use of benzodiazepines** is not recommended.  Exceptions: There are only two exceptions to the rule of not   receiving pain medications from other Healthcare Providers. 1. Exception #1 (Emergencies): In the event of an emergency (i.e.: accident requiring emergency care), you are allowed to receive additional pain medication. However, you are responsible for: As soon as you are able, call our office [phone], at any time of the day or night, and leave a message stating your name, the date and nature of the emergency, and the name and dose of the medication prescribed. In the event that your call is answered by a member of our staff, make sure to document and save the date, time, and the name of the person that took your information.  2. Exception #2 (Planned Surgery): In the event that you are scheduled by another doctor or dentist to have any type of surgery or procedure, you are allowed (for a period no longer than 30 days), to receive additional pain medication, for the acute post-op pain. However, in this case, you are responsible for picking up a copy of our "Post-op Pain Management for Surgeons" handout, and giving it to your surgeon or dentist. This document is available at our office, and does not require an appointment to obtain it. Simply go to our office during business hours (Monday-Thursday from 8:00 AM to 4:00 PM) (Friday 8:00 AM to 12:00 Noon) or if you have a scheduled appointment with us, prior to your surgery, and ask for it by name. In addition, you will need to provide us with your name, name of your surgeon, type of surgery, and date of procedure or surgery.  *Opioid medications include: morphine, codeine, oxycodone, oxymorphone, hydrocodone, hydromorphone, meperidine, tramadol, tapentadol, buprenorphine, fentanyl, methadone. **Benzodiazepine medications include: diazepam (Valium), alprazolam (Xanax), clonazepam (Klonopine), lorazepam (Ativan), clorazepate  (Tranxene), chlordiazepoxide (Librium), estazolam (Prosom), oxazepam (Serax), temazepam (Restoril), triazolam (Halcion) (Last updated: 10/14/2017) ____________________________________________________________________________________________    

## 2018-11-21 ENCOUNTER — Telehealth: Payer: Self-pay

## 2018-11-21 NOTE — Telephone Encounter (Signed)
LVM for pt to call office if he would like to reschedule his colonoscopy to the month of June/July.  Thanks [employer]

## 2019-01-13 ENCOUNTER — Telehealth: Payer: Self-pay | Admitting: Cardiovascular Disease

## 2019-01-13 MED ORDER — OLMESARTAN MEDOXOMIL 40 MG PO TABS: 40.0000 mg | ORAL_TABLET | Freq: Every day | ORAL | 11 refills | Status: DC

## 2019-01-13 NOTE — Telephone Encounter (Signed)
New Message      Dean Ford is calling because they do not have the medication Valsartan and needs something different to be sent in for the pt

## 2019-01-13 NOTE — Telephone Encounter (Signed)
Received call from patient who states he would like to try a different medication I advised him that Rx for olmesartan 40 mg will be sent to [facility] and to call back if he has any questions or concerns. He thanked me for the call.

## 2019-01-13 NOTE — Telephone Encounter (Signed)
Routing to Dr. Acie Fredrickson for alternative medication in the event that patient wants to change meds

## 2019-01-13 NOTE — Telephone Encounter (Signed)
I spoke with Dean Ford at [facility] about alternatives for valsartan that the pharmacy has in stock. She states they have losartan 100 mg and olmesartan 40 mg in stock; all strengths of the valsartan are on back order for the last 2-3 months.  Left message for patient and asked him to call back to let us know whether he would like to send valsartan 320 mg Rx to a different pharmacy or change medications.

## 2019-01-13 NOTE — Telephone Encounter (Signed)
OK to continue valsartan 320 from a different pharmacy Or changed to Olmasartan 40 mg a day

## 2019-01-25 ENCOUNTER — Ambulatory Visit: Payer: No Typology Code available for payment source | Admitting: Family Medicine

## 2019-02-02 ENCOUNTER — Other Ambulatory Visit: Payer: Self-pay

## 2019-02-02 ENCOUNTER — Encounter: Payer: Self-pay | Admitting: Family Medicine

## 2019-02-02 ENCOUNTER — Ambulatory Visit (INDEPENDENT_AMBULATORY_CARE_PROVIDER_SITE_OTHER): Payer: No Typology Code available for payment source | Admitting: Family Medicine

## 2019-02-02 VITALS — BP 136/82 | HR 77 | Temp 98.3°F | Resp 14 | Ht 74.0 in | Wt 300.9 lb

## 2019-02-02 DIAGNOSIS — M5417 Radiculopathy, lumbosacral region: Secondary | ICD-10-CM

## 2019-02-02 DIAGNOSIS — G4733 Obstructive sleep apnea (adult) (pediatric): Secondary | ICD-10-CM

## 2019-02-02 DIAGNOSIS — M059 Rheumatoid arthritis with rheumatoid factor, unspecified: Secondary | ICD-10-CM

## 2019-02-02 DIAGNOSIS — H547 Unspecified visual loss: Secondary | ICD-10-CM

## 2019-02-02 DIAGNOSIS — I10 Essential (primary) hypertension: Secondary | ICD-10-CM

## 2019-02-02 DIAGNOSIS — R195 Other fecal abnormalities: Secondary | ICD-10-CM

## 2019-02-02 DIAGNOSIS — E785 Hyperlipidemia, unspecified: Secondary | ICD-10-CM

## 2019-02-02 DIAGNOSIS — E559 Vitamin D deficiency, unspecified: Secondary | ICD-10-CM

## 2019-02-02 DIAGNOSIS — F1120 Opioid dependence, uncomplicated: Secondary | ICD-10-CM

## 2019-02-02 DIAGNOSIS — M5441 Lumbago with sciatica, right side: Secondary | ICD-10-CM | POA: Diagnosis not present

## 2019-02-02 DIAGNOSIS — Z6838 Body mass index (BMI) 38.0-38.9, adult: Secondary | ICD-10-CM

## 2019-02-02 DIAGNOSIS — E2740 Unspecified adrenocortical insufficiency: Secondary | ICD-10-CM

## 2019-02-02 DIAGNOSIS — E039 Hypothyroidism, unspecified: Secondary | ICD-10-CM

## 2019-02-02 DIAGNOSIS — G8929 Other chronic pain: Secondary | ICD-10-CM

## 2019-02-02 NOTE — Progress Notes (Signed)
Name: Dean Ford   MRN: [id number]    DOB: 04/13/1976   Date:02/02/2019       Progress Note  Subjective  Chief Complaint  Chief Complaint  Patient presents with  . Follow-up    HPI  Back pain/Lumbar Radiculopathy: Pain mamangement - Seeing Dr. Dossie Arbour. 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 He did recently file for disability and is going through this process right now.  He also had recent "micro laminectomy" performed by Dr. Trenton Gammon in 08-18-18.  He is waiting on an appointment with Dr. Marsa Aris with neurosurgeon, but it was canceled due to covid19 - will call to reschedule. He denies BLE weakness, but does have worsening pain/numbness/tingling if he stands for a long period of time.  No BUE symptoms. Did have fall this weekend - was wading in a creek, lost his footing, and fell onto a rock on his back - he has been having some spasms in the right buttock, otherwise symptoms are stable as above.    HTN: He has been seeing Dr. Acie Fredrickson (cardiology) annually in 08-19-23; pt's father passed away at age 44 from MI. He is taking carvedilol 6.25mg  - usually just takes it in the morning and misses his doses sometimes. Taking valsartan every night 320mg . Denies headaches, vision changes, chest pain, shortness of breath. Stable and unchanged.  HLD: He has been seeing Dr. Acie Fredrickson annually - last visit was Aug 19, 2023. TGD's were elevated in the 400's last visit but pt states he was not fasting so he is not taking the medication he was prescribed.  He is fearful of statin medcations, had rx for fenofibrate from Dr. Acie Fredrickson that he started for a few months, then stopped after he developed nausea.  The 10-year ASCVD risk score Mikey Bussing DC Brooke Bonito., et al., 2013) is: 3.5%   Values used to calculate the score:     Age: 43 years     Sex: Male     Is Non-Hispanic African American: No     Diabetic: No     Tobacco smoker: No    Systolic Blood Pressure: 778 mmHg     Is BP treated: Yes     HDL Cholesterol: 44 mg/dL     Total Cholesterol: 230 mg/dL   Adrenal Failure/Hypothyroidism: Sees Dr. Chalmers Cater at [facility] (Endocrinology); Taking synthroid, testosterone (once a month to once every 6 weeks), fludrocortisone, viagra, prednisone - unchanged since last visit.  Has been concerned about the prednisone because he is not losing weight. Used to get a lot of swelling in the LEFT leg, was not able to absorb electrolytes - this has since resolved since seeing Dr. Chalmers Cater.  Is due for follow up - needs to call and schedule.  Otherwise, stable and unchanged.  Rheumatoid Arthritis Markers & Vitamin D Deficiency: Sees rheumatology at [facility]. Taking plaquenil BID is struggling with nausea and vomiting from the medication, but has not lost any weight. States he was never diagnosed with RA but had elevated markers. He does note recent labs showing low vitamin D - taking supplement now.   Obesity: Body mass index is 38.63 kg/m. Weight Management History: - Diet:Eating balanced diet; taking prednisone; skips breakfast; trying to decrease carbohydrates more.  Is really frustrated with his weight because he is not able to lose weight.  - Exercise:not able to be active- severe back pain. Co-Morbid Conditions:dyslipidemias, hypertension, osteoarthritis  and sleep apnea/hypopnea; 2 or more of these conditions combined with BMI >30 is considered morbid obesity; is this diagnosis appropriate and/or added to patient's problem list?Yes Offered nutritionist - does not want to go at this time, but will call back if he changes his mind. Unchanged.  OSA: Using CPAP daily. Doing well, no changes  Depression: Says it has been "humbling" dealing with all of his medical issues over the last year or so.  He has been trying to apply for disability, and is working on this.  He is not having "negative  thoughts", but just wishes he could be working instead of disabled. He feels like he is coping better now, but still struggles with not working from time to time.     Office Visit from 02/02/2019 in [facility]  PHQ-9 Total Score  0      Patient Active Problem List   Diagnosis Date Noted  . Hypogonadism male 09/28/2018  . Rheumatoid arthritis ([place]) 09/28/2018  . Carpal tunnel syndrome 09/28/2018  . Chronic musculoskeletal pain 04/25/2018  . Spondylosis without myelopathy or radiculopathy, lumbosacral region 02/08/2018  . Chronic lower extremity pain (Secondary Area of Pain) (Bilateral) (R>L) 02/08/2018  . Acute myofascial pain 05/04/2017  . 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 Acute lumbar radiculopathy (Right) (S1) 05/26/2016  . Spasm of paraspinal muscle 01/22/2016  . Lower extremity numbness (Bilateral) (R>L) 01/22/2016  . Chronic lumbar radicular pain (Right:S1, Bilateral:L5) (Bilateral) (R>L) 01/22/2016  . Chronic hip pain (Right) 01/22/2016  . Osteoarthritis of hip (Right) 01/22/2016  . Chronic low back pain (Primary Source of Pain) (R>L) 12/18/2015  . Erectile dysfunction 12/18/2015  . Chronic pain 09/02/2015  . Long term prescription opiate use 09/02/2015  . Encounter for chronic pain management 09/02/2015  . Chronic sacroiliac joint pain (Bilateral) (R>L) 06/11/2015  . Lumbar facet hypertrophy 06/11/2015  . Lumbosacral radiculopathy at S1 (right-sided) 06/11/2015  . Encounter for therapeutic drug level monitoring 06/03/2015  . Long term current use of opiate analgesic 06/03/2015  . Uncomplicated opioid dependence ([place]) 06/03/2015  . Opiate use 06/03/2015  . Lumbar spondylosis 06/03/2015  . Lumbar facet syndrome (Bilateral) (R>L) 06/03/2015  . Insomnia, persistent 06/03/2015  . Intermittent muscle cramps 06/03/2015  . Always thirsty 06/03/2015  . Avitaminosis D 06/03/2015  . Lumbar spine pain  06/03/2015  . Hip arthrosis 06/03/2015  . Class 2 severe obesity due to excess calories with serious comorbidity and body mass index (BMI) of 38.0 to 38.9 in adult ([place]) 06/03/2015  . DDD (degenerative disc disease), lumbar 06/03/2015  . L4-L5 disc bulge 06/03/2015  . Ligamentum flavum hypertrophy ([place]) 06/03/2015  . Bulging lumbar disc 06/03/2015  . Sleep apnea 06/03/2015  . Dyslipidemia 03/01/2015  . Dehydration symptoms 01/28/2015  . Essential hypertension 07/20/2014  . Chest discomfort 07/20/2014  . Hypothyroidism 07/20/2014  . Adrenal insufficiency ([place]) 07/20/2014    Past Surgical History:  Procedure Laterality Date  . BACK SURGERY    . ELBOW SURGERY     right elbow    Family History  Problem Relation Age of Onset  . Osteoarthritis Mother   . Heart attack Father 24  . Hypertension Father   . Hypertension Sister     Social History   Socioeconomic History  . Marital status: Married    Spouse name: Stephaine  . Number of children: 1  . Years of education: Not on file  . Highest education level: Not on file  Occupational History  .  Not on file  Social Needs  . Financial resource strain: Not hard at all  . Food insecurity    Worry: Never true    Inability: Never true  . Transportation needs    Medical: No    Non-medical: No  Tobacco Use  . Smoking status: Former Smoker    Quit date: 11/30/1995    Years since quitting: 23.1  . Smokeless tobacco: Former Systems developer    Quit date: 11/30/1995  Substance and Sexual Activity  . Alcohol use: Yes    Alcohol/week: 0.0 standard drinks    Comment: rarely  . Drug use: No  . Sexual activity: Yes    Partners: Female  Lifestyle  . Physical activity    Days per week: 3 days    Minutes per session: 30 min  . Stress: Not at all  Relationships  . Social connections    Talks on phone: More than three times a week    Gets together: More than three times a week    Attends religious service: Never    Active member of club or  organization: No    Attends meetings of clubs or organizations: Never    Relationship status: Married  . Intimate partner violence    Fear of current or ex partner: No    Emotionally abused: No    Physically abused: No    Forced sexual activity: No  Other Topics Concern  . Not on file  Social History Narrative  . Not on file     Current Outpatient Medications:  .  baclofen (LIORESAL) 10 MG tablet, Take 1 tablet (10 mg total) by mouth 3 (three) times daily., Disp: 90 tablet, Rfl: 2 .  carvedilol (COREG) 6.25 MG tablet, TAKE 1 TABLET BY MOUTH TWO TIMES DAILY, Disp: 180 tablet, Rfl: 3 .  fludrocortisone (FLORINEF) 0.1 MG tablet, Take 0.1 mg by mouth daily as needed for dizziness., Disp: , Rfl:  .  hydrocortisone (CORTEF) 10 MG tablet, Take 10 mg by mouth daily. , Disp: , Rfl:  .  hydroxychloroquine (PLAQUENIL) 200 MG tablet, Take 1 tablet by mouth daily., Disp: , Rfl: 3 .  levothyroxine (SYNTHROID, LEVOTHROID) 100 MCG tablet, Take 1 tablet by mouth daily., Disp: , Rfl: 3 .  olmesartan (BENICAR) 40 MG tablet, Take 1 tablet (40 mg total) by mouth daily., Disp: 30 tablet, Rfl: 11 .  predniSONE (DELTASONE) 5 MG tablet, Take 5 mg by mouth daily with breakfast. take 1 1/2 (7.5mg ) qd, Disp: , Rfl:  .  sildenafil (VIAGRA) 100 MG tablet, Take 1 tablet by mouth daily as needed., Disp: , Rfl: 12 .  testosterone cypionate (DEPOTESTOTERONE CYPIONATE) 200 MG/ML injection, Inject 100 mg into the muscle every 14 (fourteen) days. , Disp: , Rfl:  .  traMADol (ULTRAM) 50 MG tablet, Take 1-2 tablets (50-100 mg total) by mouth every 6 (six) hours as needed for severe pain., Disp: 240 tablet, Rfl: 5 .  Vitamin D, Ergocalciferol, (DRISDOL) 50000 units CAPS capsule, Take 50,000 Units by mouth once a week., Disp: , Rfl: 2  Allergies  Allergen Reactions  . Borax Rash    Borax detergent    I personally reviewed active problem list, medication list, allergies, notes from last encounter, lab results with the  patient/caregiver today.   ROS Constitutional: Negative for fever or weight change.  Respiratory: Negative for cough and shortness of breath.   Cardiovascular: Negative for chest pain or palpitations.  Gastrointestinal: Negative for abdominal pain, no bowel changes.  Musculoskeletal:  Negative for gait problem or joint swelling. Has chronic back pain.  Skin: Negative for rash.  Neurological: Negative for dizziness or headache.  No other specific complaints in a complete review of systems (except as listed in HPI above).   Objective  Vitals:   02/02/19 0933  BP: 136/82  Pulse: 77  Resp: 14  Temp: 98.3 F (36.8 C)  TempSrc: Oral  SpO2: 97%  Weight: (!) 300 lb 14.4 oz (136.5 kg)  Height: 6\' 2"  (1.88 m)   Body mass index is 38.63 kg/m.  Physical Exam Constitutional: Patient appears well-developed and well-nourished. No distress.  HENT: Head: Normocephalic and atraumatic. Ears: bilateral TMs with no erythema or effusion; Nose: Nose normal.  Eyes: Conjunctivae and EOM are normal. No scleral icterus. Neck: Normal range of motion. Neck supple. No JVD present. No thyromegaly present.  Cardiovascular: Normal rate, regular rhythm and normal heart sounds.  No murmur heard. No BLE edema. Pulmonary/Chest: Effort normal and breath sounds normal. No respiratory distress. Musculoskeletal: Normal range of motion, no joint effusions. No gross deformities Neurological: Pt is alert and oriented to person, place, and time. No cranial nerve deficit. Coordination, balance, strength, speech and gait are normal.  Skin: Skin is warm and dry. No rash noted. No erythema. Has fresh bruise across the right low back - the area is slightly tender, there is no spinal tenderness, no step-offs. Psychiatric: Patient has a normal mood and affect. behavior is normal. Judgment and thought content normal.  No results found for this or any previous visit (from the past 72 hour(s)).   PHQ2/9: Depression screen  [facility] 2/9 02/02/2019 09/26/2018 06/23/2018 04/25/2018 11/15/2017  Decreased Interest 0 0 0 0 0  Down, Depressed, Hopeless 0 0 0 0 0  PHQ - 2 Score 0 0 0 0 0  Altered sleeping 0 0 0 - -  Tired, decreased energy 0 0 0 - -  Change in appetite 0 0 0 - -  Feeling bad or failure about yourself  0 0 0 - -  Trouble concentrating 0 0 0 - -  Moving slowly or fidgety/restless 0 0 0 - -  Suicidal thoughts 0 0 0 - -  PHQ-9 Score 0 0 0 - -  Difficult doing work/chores Not difficult at all Not difficult at all Not difficult at all - -   PHQ-2/9 Result is negative.    Fall Risk: Fall Risk  02/02/2019 10/20/2018 09/26/2018 06/23/2018 04/25/2018  Falls in the past year? 1 0 1 0 No  Number falls in past yr: 0 - 1 - -  Injury with Fall? 1 0 1 - -  Comment - - - - -  Risk for fall due to : - - - - -  Risk for fall due to: Comment - - - - -  Follow up - - Falls evaluation completed - -   Functional Status Survey: Is the patient deaf or have difficulty hearing?: No Does the patient have difficulty seeing, even when wearing glasses/contacts?: Yes Does the patient have difficulty concentrating, remembering, or making decisions?: No Does the patient have difficulty walking or climbing stairs?: No Does the patient have difficulty dressing or bathing?: No Does the patient have difficulty doing errands alone such as visiting a doctor's office or shopping?: No   Assessment & Plan  1. Lumbosacral radiculopathy at S1 (right-sided) 2. Uncomplicated opioid dependence (HCC)\ 3. Chronic low back pain (Primary Source of Pain) (R>L) - Continue with pain management, needs to contact Dr. Marsa Aris  about surgical options as well.  4. Essential hypertension - Stable today, continue current medication.  5. Dyslipidemia - Not taking any medications at this time, The 10-year ASCVD risk score Mikey Bussing DC Brooke Bonito., et al., 2013) is: 3.5%   Values used to calculate the score:     Age: 37 years     Sex: Male     Is Non-Hispanic African  American: No     Diabetic: No     Tobacco smoker: No     Systolic Blood Pressure: 426 mmHg     Is BP treated: Yes     HDL Cholesterol: 44 mg/dL     Total Cholesterol: 230 mg/dL  6. Adrenal insufficiency ([place]) - Needs to reschedule with endocrinologist  7. Hypothyroidism, unspecified type - Needs to reschedule with endocrinologist  8. Avitaminosis D - Taking supplement.  9. Class 2 severe obesity due to excess calories with serious comorbidity and body mass index (BMI) of 38.0 to 38.9 in adult [facility]) - Discussed importance of 150 minutes of physical activity weekly, eat two servings of fish weekly, eat one serving of tree nuts ( cashews, pistachios, pecans, almonds.Marland Kitchen) every other day, eat 6 servings of fruit/vegetables daily and drink plenty of water and avoid sweet beverages.  - Recommend nutritionist, declines today  10. Obstructive sleep apnea syndrome - Wearing Cpap  11. Rheumatoid arthritis with positive rheumatoid factor, involving unspecified site [facility] - Needs follow up to discuss plaquenil side effects - he will call to schedule. - Ambulatory referral to Ophthalmology  12. Decreased visual acuity - Ambulatory referral to Ophthalmology

## 2019-02-09 ENCOUNTER — Encounter: Payer: Self-pay | Admitting: Pain Medicine

## 2019-02-09 DIAGNOSIS — Z79899 Other long term (current) drug therapy: Secondary | ICD-10-CM | POA: Insufficient documentation

## 2019-02-09 DIAGNOSIS — M899 Disorder of bone, unspecified: Secondary | ICD-10-CM | POA: Insufficient documentation

## 2019-02-09 DIAGNOSIS — Z789 Other specified health status: Secondary | ICD-10-CM | POA: Insufficient documentation

## 2019-02-09 NOTE — Progress Notes (Signed)
Pain Management Virtual Encounter Note - Virtual Visit via Telephone Telehealth (real-time audio visits between healthcare provider and patient).   Patient's Phone No. & Preferred Pharmacy:  [phone] (home); [phone] (mobile); (Preferred) [phone] [email or web address]  [facility], [address] [place] [address] Phone: [phone] Fax: [phone]    Pre-screening note:  Our staff contacted Dean Ford and offered him an "in person", "face-to-face" appointment versus a telephone encounter. He indicated preferring the telephone encounter, at this time.   Reason for Virtual Visit: COVID-19*  Social distancing based on CDC and AMA recommendations.   I contacted Dean Ford on 02/13/2019 via telephone.      I clearly identified myself as Gaspar Cola, MD. I verified that I was speaking with the correct person using two identifiers (Name: Dean Ford, and date of birth: 01-28-1976).  Advanced Informed Consent I sought verbal advanced consent from Dean Ford for virtual visit interactions. I informed Dean Ford of possible security and privacy concerns, risks, and limitations associated with providing "not-in-person" medical evaluation and management services. I also informed Dean Ford of the availability of "in-person" appointments. Finally, I informed him that there would be a charge for the virtual visit and that he could be  personally, fully or partially, financially responsible for it. Dean Ford expressed understanding and agreed to proceed.   Historic Elements   Dean Ford is a 43 y.o. year old, male patient evaluated today after his last encounter by our practice on 11/17/2018. Dean Ford  has a past medical history of Adrenal insufficiency ([place]), Anxiety, Anxiety disorder (01/28/2015), Arthritis, Back pain, chronic (06/03/2015), Hypertension, Hypothyroid, Sleep apnea, and Thyroid disease. He also  has  a past surgical history that includes Elbow surgery and Back surgery. Dean Ford has a current medication list which includes the following prescription(s): carvedilol, fludrocortisone, hydrocortisone, hydroxychloroquine, levothyroxine, olmesartan, prednisone, sildenafil, testosterone cypionate, vitamin d (ergocalciferol), baclofen, and tramadol. He  reports that he quit smoking about 23 years ago. He quit smokeless tobacco use about 23 years ago. He reports current alcohol use. He reports that he does not use drugs. Dean Ford is allergic to borax.   HPI  Today, he is being contacted for medication management.  The patient indicates having absolutely no problems with the medicines.  On 11/17/2018 we had placed a referral for him to see Dr. Lysle Morales, however because of the pandemic, this appears to have fallen through the cracks.  Today we will call his office and remind them that there is a referral.  The patient has a failed back surgical syndrome and he continues to have problems with his lower back.  He wants to have a neurosurgical evaluation.  If they determined that he is not a good surgical candidate, then we will go ahead and continue with our planned treatment.  We need to consider doing a caudal epidural steroid injection + epidurogram to determine if he has epidural fibrosis.  If he does, then we will schedule him for a Racz procedure.  In addition, this patient has a history of lumbar facet syndrome.  If his primary pain is in the lower extremities, this is likely to be intraspinal and therefore he may either need some additional surgery or the Racz procedure.  If the primary pain is in the lower back, then it is likely to be coming from the lumbar facets and therefore we would need is a possible  radiofrequency ablation.  He has had the diagnostic lumbar facet blocks, the last of which was done on 02/08/2018.  Unfortunately, he does not follow-up with the post procedure appointments and therefore we  have missed the opportunity to collect accurate information regarding the results of these diagnostic injections.  Pharmacotherapy Assessment  Analgesic: Tramadol 100 mg every 6 hours (400 mg/day of tramadol)  MME/day:40 mg/day  Monitoring: Pharmacotherapy: No side-effects or adverse reactions reported.  PMP: PDMP reviewed during this encounter.       Compliance: No problems identified. Effectiveness: Clinically acceptable. Plan: Refer to "POC".  Pertinent Labs   SAFETY SCREENING Profile Lab Results  Component Value Date   HIV NON-REACTIVE 09/26/2018   Renal Function Lab Results  Component Value Date   BUN 12 06/23/2018   CREATININE 0.88 53/66/4403   BCR NOT APPLICABLE 47/42/5956   GFRAA 122 06/23/2018   GFRNONAA 105 06/23/2018   Hepatic Function Lab Results  Component Value Date   AST 19 06/23/2018   ALT 40 06/23/2018   ALBUMIN 4.8 03/15/2018   UDS Summary  Date Value Ref Range Status  10/20/2018 FINAL  Final    Comment:    ==================================================================== TOXASSURE SELECT 13 (MW) ==================================================================== Test                             Result       Flag       Units Drug Present and Declared for Prescription Verification   Tramadol                       >2513        EXPECTED   ng/mg creat   O-Desmethyltramadol            >2513        EXPECTED   ng/mg creat   N-Desmethyltramadol            1203         EXPECTED   ng/mg creat    Source of tramadol is a prescription medication.    O-desmethyltramadol and N-desmethyltramadol are expected    metabolites of tramadol. ==================================================================== Test                      Result    Flag   Units      Ref Range   Creatinine              199              mg/dL      >=20 ==================================================================== Declared Medications:  The flagging and interpretation on  this report are based on the  following declared medications.  Unexpected results may arise from  inaccuracies in the declared medications.  **Note: The testing scope of this panel includes these medications:  Tramadol  **Note: The testing scope of this panel does not include following  reported medications:  Baclofen  Carvedilol  Fludrocortisone  Hydrocortisone  Hydroxychloroquine  Levothyroxine  Methocarbamol  Prednisolone  Sildenafil  Testosterone  Valsartan  Vitamin D2 (Ergocalciferol) ==================================================================== For clinical consultation, please call [phone]. ====================================================================    Note: Above Lab results reviewed.  Recent imaging  MR Lumbar Spine W Wo Contrast CLINICAL DATA:  Acute right-sided back pain radiating down the right leg for the past 2 days. Prior lumbar discectomies.  EXAM: MRI LUMBAR SPINE WITHOUT AND WITH CONTRAST  TECHNIQUE: Multiplanar and multiecho pulse sequences  of the lumbar spine were obtained without and with intravenous contrast.  CONTRAST:  10 mL Gadavist intravenous contrast.  COMPARISON:  MRI lumbar spine dated October 15, 2016.  FINDINGS: Segmentation:  Standard.  Alignment: Unchanged 2 mm retrolisthesis at L4-L5. Unchanged 5 mm retrolisthesis at L5-S1.  Vertebrae: No fracture, evidence of discitis, or bone lesion. Chronic fatty endplate marrow changes at L5-S1.  Conus medullaris and cauda equina: Conus extends to the L1 level. Conus and cauda equina appear normal. No intradural enhancement.  Paraspinal and other soft tissues: Negative.  Disc levels:  T12-L1:  Negative.  L1-L2:  Negative.  L2-L3:  Normal disc.  Mild facet arthropathy.  No stenosis.  L3-L4:  Normal disc.  Mild facet arthropathy.  No stenosis.  L4-L5: Unchanged mild diffuse disc bulge with superimposed left foraminal and far lateral disc protrusion. Unchanged  mild left greater than right neuroforaminal stenosis.  L5-S1: Prior right laminectomy. Right paracentral and foraminal disc protrusion impinging on the descending right S1 nerve root. Mild bilateral facet arthropathy. Unchanged severe right neuroforaminal stenosis. No spinal canal or left neuroforaminal stenosis.  IMPRESSION: 1. Right paracentral and foraminal disc protrusion at L5-S1 with unchanged impingement of the descending right S1 nerve root. Unchanged severe right neuroforaminal stenosis likely affecting the exiting right L5 nerve root. 2. Unchanged mild degenerative disc disease at L4-L5.  Electronically Signed   By: Titus Dubin M.D.   On: 06/10/2018 12:27  Assessment  The primary encounter diagnosis was Chronic pain syndrome. 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  Plan of Care  I have changed Maudry Mayhew. Posten's traMADol. I am also having him maintain his hydrocortisone, testosterone cypionate, fludrocortisone, Vitamin D (Ergocalciferol), hydroxychloroquine, levothyroxine, sildenafil, carvedilol, predniSONE, olmesartan, and baclofen.  Pharmacotherapy (Medications Ordered): Meds ordered this encounter  Medications  . baclofen (LIORESAL) 10 MG tablet    Sig: Take 1 tablet (10 mg total) by mouth 3 (three) times daily.    Dispense:  90 tablet    Refill:  2    Fill one day early if pharmacy is closed on scheduled refill date. May substitute for generic if available.  . traMADol (ULTRAM) 50 MG tablet    Sig: Take 1-2 tablets (50-100 mg total) by mouth every 6 (six) hours as needed for severe pain.  Must last 30 days    Dispense:  240 tablet    Refill:  2    Chronic Pain: STOP Act (Not applicable) Fill 1 day early if closed on refill date. Do not fill until: 02/15/19. To last until: 05/16/19. Avoid benzodiazepines within 8 hours of opioids.   Orders:  No orders of the defined types were placed in this encounter.  Follow-up plan:   Return for (VV), E/M (MM).    Recent Visits Date Type Provider Dept  11/17/18 Office Visit Vevelyn Francois, NP [facility]  Showing recent visits within past 90 days and meeting all other requirements   Today's Visits Date Type Provider Dept  02/13/19 Office Visit Milinda Pointer, MD [facility]  Showing today's visits and meeting all other requirements   Future Appointments No visits were found meeting these conditions.  Showing future appointments within next 90 days and meeting all other  requirements   I discussed the assessment and treatment plan with the patient. The patient was provided an opportunity to ask questions and all were answered. The patient agreed with the plan and demonstrated an understanding of the instructions.  Patient advised to call back or seek an in-person evaluation if the symptoms or condition worsens.  Total duration of non-face-to-face encounter: 12 minutes.  Note by: Gaspar Cola, MD Date: 02/13/2019; Time: 8:30 AM  Note: This dictation was prepared with Dragon dictation. Any transcriptional errors that may result from this process are unintentional.  Disclaimer:  * Given the special circumstances of the COVID-19 pandemic, the federal government has announced that the Office for Civil Rights (OCR) will exercise its enforcement discretion and will not impose penalties on physicians using telehealth in the event of noncompliance with regulatory requirements under the [place] (HIPAA) in connection with the good faith provision of telehealth during the  EVOJJ-00 national public health emergency. ([place])

## 2019-02-13 ENCOUNTER — Ambulatory Visit: Payer: No Typology Code available for payment source | Attending: Nurse Practitioner | Admitting: Pain Medicine

## 2019-02-13 ENCOUNTER — Other Ambulatory Visit: Payer: Self-pay

## 2019-02-13 DIAGNOSIS — Z79899 Other long term (current) drug therapy: Secondary | ICD-10-CM

## 2019-02-13 DIAGNOSIS — M5417 Radiculopathy, lumbosacral region: Secondary | ICD-10-CM

## 2019-02-13 DIAGNOSIS — G894 Chronic pain syndrome: Secondary | ICD-10-CM

## 2019-02-13 DIAGNOSIS — G8929 Other chronic pain: Secondary | ICD-10-CM

## 2019-02-13 DIAGNOSIS — M79605 Pain in left leg: Secondary | ICD-10-CM

## 2019-02-13 DIAGNOSIS — M79604 Pain in right leg: Secondary | ICD-10-CM | POA: Diagnosis not present

## 2019-02-13 DIAGNOSIS — Z789 Other specified health status: Secondary | ICD-10-CM

## 2019-02-13 DIAGNOSIS — M961 Postlaminectomy syndrome, not elsewhere classified: Secondary | ICD-10-CM | POA: Diagnosis not present

## 2019-02-13 DIAGNOSIS — M7918 Myalgia, other site: Secondary | ICD-10-CM

## 2019-02-13 DIAGNOSIS — M899 Disorder of bone, unspecified: Secondary | ICD-10-CM

## 2019-02-13 DIAGNOSIS — M5441 Lumbago with sciatica, right side: Secondary | ICD-10-CM | POA: Diagnosis not present

## 2019-02-13 MED ORDER — TRAMADOL HCL 50 MG PO TABS: 50.0000 mg | ORAL_TABLET | Freq: Four times a day (QID) | ORAL | 2 refills | Status: DC | PRN

## 2019-02-13 MED ORDER — BACLOFEN 10 MG PO TABS: 10.0000 mg | ORAL_TABLET | Freq: Three times a day (TID) | ORAL | 2 refills | Status: DC

## 2019-02-13 NOTE — Patient Instructions (Signed)
____________________________________________________________________________________________  Medication Recommendations and Reminders  Applies to: All patients receiving prescriptions (written and/or electronic).  Medication Rules & Regulations: These rules and regulations exist for your safety and that of others. They are not flexible and neither are we. Dismissing or ignoring them will be considered "non-compliance" with medication therapy, resulting in complete and irreversible termination of such therapy. (See document titled "Medication Rules" for more details.) In all conscience, because of safety reasons, we cannot continue providing a therapy where the patient does not follow instructions.  Pharmacy of record:   Definition: This is the pharmacy where your electronic prescriptions will be sent.   We do not endorse any particular pharmacy.  You are not restricted in your choice of pharmacy.  The pharmacy listed in the electronic medical record should be the one where you want electronic prescriptions to be sent.  If you choose to change pharmacy, simply notify our nursing staff of your choice of new pharmacy.  Recommendations:  Keep all of your pain medications in a safe place, under lock and key, even if you live alone.   After you fill your prescription, take 1 week's worth of pills and put them away in a safe place. You should keep a separate, properly labeled bottle for this purpose. The remainder should be kept in the original bottle. Use this as your primary supply, until it runs out. Once it's gone, then you know that you have 1 week's worth of medicine, and it is time to come in for a prescription refill. If you do this correctly, it is unlikely that you will ever run out of medicine.  To make sure that the above recommendation works, it is very important that you make sure your medication refill appointments are scheduled at least 1 week before you run out of medicine. To do  this in an effective manner, make sure that you do not leave the office without scheduling your next medication management appointment. Always ask the nursing staff to show you in your prescription , when your medication will be running out. Then arrange for the receptionist to get you a return appointment, at least 7 days before you run out of medicine. Do not wait until you have 1 or 2 pills left, to come in. This is very poor planning and does not take into consideration that we may need to cancel appointments due to bad weather, sickness, or emergencies affecting our staff.  "Partial Fill": If for any reason your pharmacy does not have enough pills/tablets to completely fill or refill your prescription, do not allow for a "partial fill". You will need a separate prescription to fill the remaining amount, which we will not provide. If the reason for the partial fill is your insurance, you will need to talk to the pharmacist about payment alternatives for the remaining tablets, but again, do not accept a partial fill.  Prescription refills and/or changes in medication(s):   Prescription refills, and/or changes in dose or medication, will be conducted only during scheduled medication management appointments. (Applies to both, written and electronic prescriptions.)  No refills on procedure days. No medication will be changed or started on procedure days. No changes, adjustments, and/or refills will be conducted on a procedure day. Doing so will interfere with the diagnostic portion of the procedure.  No phone refills. No medications will be "called into the pharmacy".  No Fax refills.  No weekend refills.  No Holliday refills.  No after hours refills.  Remember:  Business   hours are:  Monday to Thursday 8:00 AM to 4:00 PM Provider's Schedule: Crystal King, NP - Appointments are:  Medication management: Monday to Thursday 8:00 AM to 4:00 PM Sahmir Weatherbee, MD - Appointments are:   Medication management: Monday and Wednesday 8:00 AM to 4:00 PM Procedure day: Tuesday and Thursday 7:30 AM to 4:00 PM Bilal [name], MD - Appointments are:  Medication management: Tuesday and Thursday 8:00 AM to 4:00 PM Procedure day: Monday and Wednesday 7:30 AM to 4:00 PM (Last update: 10/14/2017) ____________________________________________________________________________________________    

## 2019-02-14 ENCOUNTER — Encounter: Payer: No Typology Code available for payment source | Admitting: Nurse Practitioner

## 2019-02-20 ENCOUNTER — Other Ambulatory Visit
Admission: RE | Admit: 2019-02-20 | Discharge: 2019-02-20 | Disposition: A | Discharge status: Home / Self Care | Payer: No Typology Code available for payment source | Source: Ambulatory Visit | Attending: Gastroenterology | Admitting: Gastroenterology

## 2019-02-20 ENCOUNTER — Other Ambulatory Visit: Payer: Self-pay

## 2019-02-20 DIAGNOSIS — Z01812 Encounter for preprocedural laboratory examination: Secondary | ICD-10-CM | POA: Diagnosis present

## 2019-02-20 DIAGNOSIS — Z1159 Encounter for screening for other viral diseases: Secondary | ICD-10-CM | POA: Insufficient documentation

## 2019-02-21 LAB — SARS CORONAVIRUS 2 (TAT 6-24 HRS): SARS Coronavirus 2: NEGATIVE

## 2019-02-23 ENCOUNTER — Ambulatory Visit
Admission: RE | Admit: 2019-02-23 | Discharge: 2019-02-23 | Disposition: A | Discharge status: Home / Self Care | Payer: No Typology Code available for payment source | Attending: Gastroenterology | Admitting: Gastroenterology

## 2019-02-23 ENCOUNTER — Ambulatory Visit: Payer: No Typology Code available for payment source | Admitting: Anesthesiology

## 2019-02-23 ENCOUNTER — Encounter
Admission: RE | Disposition: A | Discharge status: Home / Self Care | Payer: Self-pay | Source: Home / Self Care | Attending: Gastroenterology

## 2019-02-23 ENCOUNTER — Encounter: Payer: Self-pay | Admitting: *Deleted

## 2019-02-23 DIAGNOSIS — Z6839 Body mass index (BMI) 39.0-39.9, adult: Secondary | ICD-10-CM | POA: Diagnosis not present

## 2019-02-23 DIAGNOSIS — Z79890 Hormone replacement therapy (postmenopausal): Secondary | ICD-10-CM | POA: Insufficient documentation

## 2019-02-23 DIAGNOSIS — K922 Gastrointestinal hemorrhage, unspecified: Secondary | ICD-10-CM | POA: Insufficient documentation

## 2019-02-23 DIAGNOSIS — Z87891 Personal history of nicotine dependence: Secondary | ICD-10-CM | POA: Insufficient documentation

## 2019-02-23 DIAGNOSIS — D128 Benign neoplasm of rectum: Secondary | ICD-10-CM | POA: Diagnosis not present

## 2019-02-23 DIAGNOSIS — E2740 Unspecified adrenocortical insufficiency: Secondary | ICD-10-CM | POA: Insufficient documentation

## 2019-02-23 DIAGNOSIS — M1990 Unspecified osteoarthritis, unspecified site: Secondary | ICD-10-CM | POA: Diagnosis not present

## 2019-02-23 DIAGNOSIS — I10 Essential (primary) hypertension: Secondary | ICD-10-CM | POA: Insufficient documentation

## 2019-02-23 DIAGNOSIS — R195 Other fecal abnormalities: Secondary | ICD-10-CM

## 2019-02-23 DIAGNOSIS — E039 Hypothyroidism, unspecified: Secondary | ICD-10-CM | POA: Diagnosis not present

## 2019-02-23 DIAGNOSIS — K5730 Diverticulosis of large intestine without perforation or abscess without bleeding: Secondary | ICD-10-CM | POA: Diagnosis not present

## 2019-02-23 DIAGNOSIS — K621 Rectal polyp: Secondary | ICD-10-CM | POA: Diagnosis not present

## 2019-02-23 DIAGNOSIS — Z7952 Long term (current) use of systemic steroids: Secondary | ICD-10-CM | POA: Insufficient documentation

## 2019-02-23 DIAGNOSIS — Z79899 Other long term (current) drug therapy: Secondary | ICD-10-CM | POA: Insufficient documentation

## 2019-02-23 DIAGNOSIS — G4730 Sleep apnea, unspecified: Secondary | ICD-10-CM | POA: Insufficient documentation

## 2019-02-23 DIAGNOSIS — Z888 Allergy status to other drugs, medicaments and biological substances status: Secondary | ICD-10-CM | POA: Insufficient documentation

## 2019-02-23 HISTORY — PX: COLONOSCOPY WITH PROPOFOL: SHX5780

## 2019-02-23 SURGERY — COLONOSCOPY WITH PROPOFOL
Anesthesia: General

## 2019-02-23 MED ORDER — FENTANYL CITRATE (PF) 100 MCG/2ML IJ SOLN
INTRAMUSCULAR | Status: AC
  Filled 2019-02-23: qty 2

## 2019-02-23 MED ORDER — DEXMEDETOMIDINE HCL IN NACL 200 MCG/50ML IV SOLN
INTRAVENOUS | Status: DC | PRN
  Administered 2019-02-23: 4 ug via INTRAVENOUS
  Administered 2019-02-23: 8 ug via INTRAVENOUS
  Administered 2019-02-23 (×2): 4 ug via INTRAVENOUS

## 2019-02-23 MED ORDER — PROPOFOL 500 MG/50ML IV EMUL
INTRAVENOUS | Status: DC | PRN
  Administered 2019-02-23: 125 ug/kg/min via INTRAVENOUS

## 2019-02-23 MED ORDER — LIDOCAINE HCL (CARDIAC) PF 100 MG/5ML IV SOSY
PREFILLED_SYRINGE | INTRAVENOUS | Status: DC | PRN
  Administered 2019-02-23: 100 mg via INTRAVENOUS

## 2019-02-23 MED ORDER — FENTANYL CITRATE (PF) 100 MCG/2ML IJ SOLN
INTRAMUSCULAR | Status: DC | PRN
  Administered 2019-02-23: 50 ug via INTRAVENOUS

## 2019-02-23 MED ORDER — SODIUM CHLORIDE 0.9 % IV SOLN
INTRAVENOUS | Status: DC
  Administered 2019-02-23: 10:00:00 via INTRAVENOUS

## 2019-02-23 MED ORDER — LIDOCAINE HCL (PF) 2 % IJ SOLN
INTRAMUSCULAR | Status: AC
  Filled 2019-02-23: qty 10

## 2019-02-23 MED ORDER — PROPOFOL 500 MG/50ML IV EMUL
INTRAVENOUS | Status: AC
  Filled 2019-02-23: qty 50

## 2019-02-23 MED ORDER — PROPOFOL 10 MG/ML IV BOLUS
INTRAVENOUS | Status: DC | PRN
  Administered 2019-02-23: 30 mg via INTRAVENOUS
  Administered 2019-02-23 (×2): 50 mg via INTRAVENOUS

## 2019-02-23 NOTE — Addendum Note (Signed)
Addendum  created 02/23/19 1412 by Lavone Orn, CRNA   Intraprocedure LDAs edited, LDA properties accepted

## 2019-02-23 NOTE — Anesthesia Preprocedure Evaluation (Signed)
Anesthesia Evaluation  Patient identified by MRN, date of birth, ID band Patient awake    Reviewed: Allergy & Precautions, H&P , NPO status , Patient's Chart, lab work & pertinent test results, reviewed documented beta blocker date and time   History of Anesthesia Complications Negative for: history of anesthetic complications  Airway Mallampati: IV  TM Distance: >3 FB Neck ROM: full    Dental  (+) Dental Advidsory Given, Teeth Intact   Pulmonary neg shortness of breath, sleep apnea and Continuous Positive Airway Pressure Ventilation , neg recent URI, former smoker,           Cardiovascular Exercise Tolerance: Good hypertension, (-) angina(-) Past MI (-) dysrhythmias (-) Valvular Problems/Murmurs     Neuro/Psych PSYCHIATRIC DISORDERS Anxiety negative neurological ROS     GI/Hepatic negative GI ROS, Neg liver ROS,   Endo/Other  neg diabetesHypothyroidism Morbid obesity  Renal/GU negative Renal ROS  negative genitourinary   Musculoskeletal   Abdominal   Peds  Hematology negative hematology ROS (+)   Anesthesia Other Findings Past Medical History: No date: Adrenal insufficiency ([facility]) No date: Anxiety 01/28/2015: Anxiety disorder No date: Arthritis 06/03/2015: Back pain, chronic No date: Hypertension No date: Hypothyroid No date: Sleep apnea No date: Thyroid disease   Reproductive/Obstetrics negative OB ROS                             Anesthesia Physical Anesthesia Plan  ASA: III  Anesthesia Plan: General   Post-op Pain Management:    Induction: Intravenous  PONV Risk Score and Plan: 2 and Propofol infusion and TIVA  Airway Management Planned: Natural Airway and Nasal Cannula  Additional Equipment:   Intra-op Plan:   Post-operative Plan:   Informed Consent: I have reviewed the patients History and Physical, chart, labs and discussed the procedure including the risks,  benefits and alternatives for the proposed anesthesia with the patient or authorized representative who has indicated his/her understanding and acceptance.     Dental Advisory Given  Plan Discussed with: Anesthesiologist, CRNA and Surgeon  Anesthesia Plan Comments:         Anesthesia Quick Evaluation

## 2019-02-23 NOTE — Anesthesia Post-op Follow-up Note (Signed)
Anesthesia QCDR form completed.        

## 2019-02-23 NOTE — H&P (Signed)
[name], MD [address] [place], [place], [place], [address] [address], [place], [place], [place], [address] Phone: [phone]  Fax: [phone]  Primary Care Physician:  Hubbard Hartshorn, FNP   Pre-Procedure History & Physical: HPI:  Dean Ford is a 43 y.o. male is here for a colonoscopy.   Past Medical History:  Diagnosis Date  . Adrenal insufficiency ([place])   . Anxiety   . Anxiety disorder 01/28/2015  . Arthritis   . Back pain, chronic 06/03/2015  . Hypertension   . Hypothyroid   . Sleep apnea   . Thyroid disease     Past Surgical History:  Procedure Laterality Date  . BACK SURGERY    . ELBOW SURGERY     right elbow    Prior to Admission medications   Medication Sig Start Date End Date Taking? Authorizing Provider  baclofen (LIORESAL) 10 MG tablet Take 1 tablet (10 mg total) by mouth 3 (three) times daily. 02/15/19 05/16/19 Yes Milinda Pointer, MD  carvedilol (COREG) 6.25 MG tablet TAKE 1 TABLET BY MOUTH TWO TIMES DAILY 09/13/18  Yes Nahser, Wonda Cheng, MD  fludrocortisone (FLORINEF) 0.1 MG tablet Take 0.1 mg by mouth daily as needed for dizziness.   Yes [provider]  hydroxychloroquine (PLAQUENIL) 200 MG tablet Take 1 tablet by mouth daily. 02/15/18  Yes [provider]  levothyroxine (SYNTHROID, LEVOTHROID) 100 MCG tablet Take 1 tablet by mouth daily. 02/09/18  Yes [provider]  predniSONE (DELTASONE) 5 MG tablet Take 5 mg by mouth daily with breakfast. take 1 1/2 (7.5mg ) qd   Yes [provider]  traMADol (ULTRAM) 50 MG tablet Take 1-2 tablets (50-100 mg total) by mouth every 6 (six) hours as needed for severe pain. Must last 30 days 02/15/19 05/16/19 Yes Milinda Pointer, MD  hydrocortisone (CORTEF) 10 MG tablet Take 10 mg by mouth daily.     [provider]  olmesartan (BENICAR) 40 MG tablet Take 1 tablet (40 mg total) by mouth daily. 01/13/19   Nahser, Wonda Cheng, MD  sildenafil (VIAGRA) 100 MG tablet  Take 1 tablet by mouth daily as needed. 02/09/18   [provider]  testosterone cypionate (DEPOTESTOTERONE CYPIONATE) 200 MG/ML injection Inject 100 mg into the muscle every 14 (fourteen) days.     [provider]  Vitamin D, Ergocalciferol, (DRISDOL) 50000 units CAPS capsule Take 50,000 Units by mouth once a week. 01/06/18   [provider]    Allergies as of 02/02/2019 - Review Complete 02/02/2019  Allergen Reaction Noted  . Borax Rash 06/03/2015    Family History  Problem Relation Age of Onset  . Osteoarthritis Mother   . Heart attack Father 50  . Hypertension Father   . Hypertension Sister     Social History   Socioeconomic History  . Marital status: Married    Spouse name: Stephaine  . Number of children: 1  . Years of education: Not on file  . Highest education level: Not on file  Occupational History  . Not on file  Social Needs  . Financial resource strain: Not hard at all  . Food insecurity    Worry: Never true    Inability: Never true  . Transportation needs    Medical: No    Non-medical: No  Tobacco Use  . Smoking status: Former Smoker    Quit date: 11/30/1995    Years since quitting: 23.2  . Smokeless tobacco: Former Systems developer    Quit date: 11/30/1995  Substance and Sexual Activity  . Alcohol use: Yes    Alcohol/week: 0.0 standard drinks    Comment: rarely  . Drug use: No  . Sexual activity: Yes    Partners: Female  Lifestyle  . Physical activity    Days per week: 3 days    Minutes per session: 30 min  . Stress: Not at all  Relationships  . Social connections    Talks on phone: More than three times a week    Gets together: More than three times a week    Attends religious service: Never    Active member of club or organization: No    Attends meetings of clubs or organizations: Never    Relationship status: Married  . Intimate partner violence    Fear of current or ex partner: No    Emotionally abused: No    Physically  abused: No    Forced sexual activity: No  Other Topics Concern  . Not on file  Social History Narrative  . Not on file    Review of Systems: See HPI, otherwise negative ROS  Physical Exam: BP (!) 160/102   Pulse 72   Temp (!) 96.4 F (35.8 C) (Tympanic)   Resp 18   Ht 6\' 2"  (1.88 m)   Wt (!) 138.3 kg   SpO2 98%   BMI 39.16 kg/m  General:   Alert,  pleasant and cooperative in NAD Head:  Normocephalic and atraumatic. Neck:  Supple; no masses or thyromegaly. Lungs:  Clear throughout to auscultation, normal respiratory effort.    Heart:  +S1, +S2, Regular rate and rhythm, No edema. Abdomen:  Soft, nontender and nondistended. Normal bowel sounds, without guarding, and without rebound.   Neurologic:  Alert and  oriented x4;  grossly normal neurologically.  Impression/Plan: Dean Ford is here for a colonoscopy to be performed for positive fobt  Risks, benefits, limitations, and alternatives regarding  colonoscopy have been reviewed with the patient.  Questions have been answered.  All parties agreeable.   Virgel Manifold, MD  02/23/2019, 11:03 AM

## 2019-02-23 NOTE — Anesthesia Postprocedure Evaluation (Signed)
Anesthesia Post Note  Patient: Dean Ford  Procedure(s) Performed: COLONOSCOPY WITH PROPOFOL (N/A )  Patient location during evaluation: Endoscopy Anesthesia Type: General Level of consciousness: awake and alert Pain management: pain level controlled Vital Signs Assessment: post-procedure vital signs reviewed and stable Respiratory status: spontaneous breathing, nonlabored ventilation, respiratory function stable and patient connected to nasal cannula oxygen Cardiovascular status: blood pressure returned to baseline and stable Postop Assessment: no apparent nausea or vomiting Anesthetic complications: no     Last Vitals:  Vitals:   02/23/19 1208 02/23/19 1218  BP: 116/76 120/76  Pulse: 74 69  Resp: 16 15  Temp:    SpO2: 95% 95%    Last Pain:  Vitals:   02/23/19 1208  TempSrc:   PainSc: 0-No pain                 Martha Clan

## 2019-02-23 NOTE — Transfer of Care (Signed)
Immediate Anesthesia Transfer of Care Note  Patient: Chisom Aust Edgell  Procedure(s) Performed: COLONOSCOPY WITH PROPOFOL (N/A )  Patient Location: PACU  Anesthesia Type:General  Level of Consciousness: awake  Airway & Oxygen Therapy: Patient Spontanous Breathing and Patient connected to nasal cannula oxygen  Post-op Assessment: Report given to RN and Post -op Vital signs reviewed and stable  Post vital signs: stable  Last Vitals:  Vitals Value Taken Time  BP    Temp    Pulse 73 02/23/19 1157  Resp 15 02/23/19 1157  SpO2 98 % 02/23/19 1157  Vitals shown include unvalidated device data.  Last Pain:  Vitals:   02/23/19 1001  TempSrc: Tympanic         Complications: No apparent anesthesia complications

## 2019-02-23 NOTE — Op Note (Signed)
[facility] Gastroenterology Patient Name: Dean Ford Procedure Date: 02/23/2019 11:06 AM MRN: [id number] Account #: [id number] Date of Birth: 24-May-1976 Admit Type: Outpatient Age: 43 Room: [facility] ENDO ROOM 3 Gender: Male Note Status: Finalized Procedure:            Colonoscopy Indications:          Evaluation of unexplained GI bleeding presenting with                        fecal occult blood Providers:            Annalysse Shoemaker B. Bonna Gains MD, MD Referring MD:         Forest Gleason Md, MD (Referring MD) Medicines:            Monitored Anesthesia Care Complications:        No immediate complications. Procedure:            Pre-Anesthesia Assessment:                       - ASA Grade Assessment: II - A patient with mild                        systemic disease.                       - Prior to the procedure, a History and Physical was                        performed, and patient medications, allergies and                        sensitivities were reviewed. The patient's tolerance of                        previous anesthesia was reviewed.                       - The risks and benefits of the procedure and the                        sedation options and risks were discussed with the                        patient. All questions were answered and informed                        consent was obtained.                       - Patient identification and proposed procedure were                        verified prior to the procedure by the physician, the                        nurse, the anesthesiologist, the anesthetist and the                        technician. The procedure was verified in the procedure  room.                       After obtaining informed consent, the colonoscope was                        passed under direct vision. Throughout the procedure,                        the patient's blood pressure, pulse, and oxygen   saturations were monitored continuously. The                        Colonoscope was introduced through the anus and                        advanced to the the cecum, identified by appendiceal                        orifice and ileocecal valve. The colonoscopy was                        performed with ease. The patient tolerated the                        procedure well. The quality of the bowel preparation                        was good. Findings:      The perianal and digital rectal examinations were normal.      A 5 mm polyp was found in the rectum. The polyp was sessile. The polyp       was removed with a cold snare. Resection and retrieval were complete.      Multiple diverticula were found in the sigmoid colon.      The exam was otherwise without abnormality.      The rectum, sigmoid colon, descending colon, transverse colon, ascending       colon and cecum appeared normal.      The retroflexed view of the distal rectum and anal verge was normal and       showed no anal or rectal abnormalities. Impression:           - One 5 mm polyp in the rectum, removed with a cold                        snare. Resected and retrieved.                       - Diverticulosis in the sigmoid colon.                       - The examination was otherwise normal.                       - The rectum, sigmoid colon, descending colon,                        transverse colon, ascending colon and cecum are normal.                       - The distal rectum and anal verge are  normal on                        retroflexion view. Recommendation:       - Discharge patient to home (with escort).                       - Advance diet as tolerated.                       - Continue present medications.                       - Await pathology results.                       - Repeat colonoscopy in 5 years.                       - The findings and recommendations were discussed with                        the patient.                        - The findings and recommendations were discussed with                        the patient's family.                       - Return to primary care physician as previously                        scheduled.                       - High fiber diet.                       - Return to GI clinic in 4 weeks. Procedure Code(s):    --- Professional ---                       [phone], Colonoscopy, flexible; with removal of tumor(s),                        polyp(s), or other lesion(s) by snare technique Diagnosis Code(s):    --- Professional ---                       K62.1, Rectal polyp                       R19.5, Other fecal abnormalities                       K57.30, Diverticulosis of large intestine without                        perforation or abscess without bleeding CPT copyright 2019 American Medical Association. All rights reserved. The codes documented in this report are preliminary and upon coder review may  be revised to meet current compliance requirements.  [name], MD Margretta Sidle B. Bonna Gains MD, MD 02/23/2019 11:52:44 AM This report has been signed electronically.  Number of Addenda: 0 Note Initiated On: 02/23/2019 11:06 AM Scope Withdrawal Time: 0 hours 15 minutes 48 seconds  Total Procedure Duration: 0 hours 22 minutes 48 seconds  Estimated Blood Loss: Estimated blood loss: none.      [facility]

## 2019-02-24 LAB — SURGICAL PATHOLOGY

## 2019-02-27 ENCOUNTER — Encounter: Payer: Self-pay | Admitting: Gastroenterology

## 2019-02-27 ENCOUNTER — Telehealth: Payer: Self-pay | Admitting: Gastroenterology

## 2019-02-27 NOTE — Telephone Encounter (Signed)
Left vm for pt to call office and schedule f/u apt with Dr.Tahiliani

## 2019-02-27 NOTE — Telephone Encounter (Signed)
-----   Message from Virgel Manifold, MD sent at 02/23/2019 12:03 PM EDT -----  Please set up clinic appointment with me in 2-4 weeks

## 2019-03-08 ENCOUNTER — Telehealth: Payer: Self-pay | Admitting: Gastroenterology

## 2019-03-08 ENCOUNTER — Encounter: Payer: Self-pay | Admitting: Gastroenterology

## 2019-03-08 NOTE — Telephone Encounter (Signed)
-----   Message from Virgel Manifold, MD sent at 02/23/2019 12:03 PM EDT -----  Please set up clinic appointment with me in 2-4 weeks

## 2019-03-08 NOTE — Telephone Encounter (Signed)
Left vm to schedule f/u apt

## 2019-04-06 ENCOUNTER — Telehealth: Payer: Self-pay | Admitting: Gastroenterology

## 2019-04-06 NOTE — Telephone Encounter (Signed)
Per Colonoscopy report patient needs an office visit 4 weeks follow up colonoscopy.  Thanks [employer]

## 2019-04-06 NOTE — Telephone Encounter (Signed)
Patient's wife call asking that we call her to schedule patient's colonoscopy  @ [phone]

## 2019-04-12 ENCOUNTER — Other Ambulatory Visit: Payer: Self-pay | Admitting: Neurosurgery

## 2019-04-12 DIAGNOSIS — G8929 Other chronic pain: Secondary | ICD-10-CM

## 2019-04-12 DIAGNOSIS — M545 Low back pain, unspecified: Secondary | ICD-10-CM

## 2019-04-18 ENCOUNTER — Telehealth: Payer: Self-pay

## 2019-04-18 ENCOUNTER — Other Ambulatory Visit: Payer: Self-pay

## 2019-04-18 ENCOUNTER — Ambulatory Visit
Admission: RE | Admit: 2019-04-18 | Discharge: 2019-04-18 | Disposition: A | Discharge status: Home / Self Care | Payer: No Typology Code available for payment source | Source: Ambulatory Visit | Attending: Neurosurgery | Admitting: Neurosurgery

## 2019-04-18 DIAGNOSIS — M545 Low back pain: Secondary | ICD-10-CM | POA: Diagnosis present

## 2019-04-18 DIAGNOSIS — G8929 Other chronic pain: Secondary | ICD-10-CM | POA: Diagnosis present

## 2019-04-18 LAB — POCT I-STAT CREATININE: Creatinine, Ser: 0.9 mg/dL (ref 0.61–1.24)

## 2019-04-18 MED ORDER — GADOBUTROL 1 MMOL/ML IV SOLN
10.0000 mL | Freq: Once | INTRAVENOUS | Status: AC | PRN
  Administered 2019-04-18: 18:00:00 10 mL via INTRAVENOUS

## 2019-04-18 NOTE — Telephone Encounter (Signed)

## 2019-04-18 NOTE — Progress Notes (Signed)
Virtual Visit via Video Note   This visit type was conducted due to national recommendations for restrictions regarding the COVID-19 Pandemic (e.g. social distancing) in an effort to limit this patient's exposure and mitigate transmission in our community.  Due to his co-morbid illnesses, this patient is at least at moderate risk for complications without adequate follow up.  This format is felt to be most appropriate for this patient at this time.  All issues noted in this document were discussed and addressed.  A limited physical exam was performed with this format.  Please refer to the patient's chart for his consent to telehealth for [facility].   Evaluation Performed:  Follow-up visit  This visit type was conducted due to national recommendations for restrictions regarding the COVID-19 Pandemic (e.g. social distancing).  This format is felt to be most appropriate for this patient at this time.  All issues noted in this document were discussed and addressed.  No physical exam was performed (except for noted visual exam findings with Video Visits).  Please refer to the patient's chart (MyChart message for video visits and phone note for telephone visits) for the patient's consent to telehealth for [facility].  Date:  04/19/2019   ID:  Dean Ford, DOB [birth year], MRN [id number]  Patient Location:  Home  Provider location:   [place]  PCP:  Hubbard Hartshorn, FNP  Cardiologist:  Mertie Moores, MD  Sleep Medicine:  Fransico Him, MD Electrophysiologist:  None   Chief Complaint:  OSA  History of Present Illness:    Dean Ford is a 43 y.o. male who presents via audio/video conferencing for a telehealth visit today.    This is a 43yo male with a hx of moderate OSA with an AHI of 20/hr with oxygen desaturations as low as 75% and loud snoring and now on CPAP at 14cm H2O.  He also has obesity and HTN.  He is doing well with his CPAP device and thinks that he has gotten used to it.   He tolerates the mask and feels the pressure is adequate.  Since going on CPAP he feels rested in the am and has no significant daytime sleepiness.  He denies any significant mouth or nasal dryness or nasal congestion.  He does not think that he snores.    The patient does not have symptoms concerning for COVID-19 infection (fever, chills, cough, or new shortness of breath).    Prior CV studies:   The following studies were reviewed today:  PAP compliance download  Past Medical History:  Diagnosis Date  . Adrenal insufficiency ([place])   . Anxiety   . Anxiety disorder 01/28/2015  . Arthritis   . Back pain, chronic 06/03/2015  . Hypertension   . Hypothyroid   . Sleep apnea   . Thyroid disease    Past Surgical History:  Procedure Laterality Date  . BACK SURGERY    . COLONOSCOPY WITH PROPOFOL N/A 02/23/2019   Procedure: COLONOSCOPY WITH PROPOFOL;  Surgeon: Virgel Manifold, MD;  Location: [facility] ENDOSCOPY;  Service: Endoscopy;  Laterality: N/A;  . ELBOW SURGERY     right elbow     Current Meds  Medication Sig  . baclofen (LIORESAL) 10 MG tablet Take 1 tablet (10 mg total) by mouth 3 (three) times daily.  . carvedilol (COREG) 6.25 MG tablet TAKE 1 TABLET BY MOUTH TWO TIMES DAILY  . fludrocortisone (FLORINEF) 0.1 MG tablet Take 0.1 mg by mouth daily as needed for dizziness.  Marland Kitchen  hydrocortisone (CORTEF) 10 MG tablet Take 10 mg by mouth daily.   . hydroxychloroquine (PLAQUENIL) 200 MG tablet Take 1 tablet by mouth daily.  Marland Kitchen levothyroxine (SYNTHROID, LEVOTHROID) 100 MCG tablet Take 1 tablet by mouth daily.  Marland Kitchen olmesartan (BENICAR) 40 MG tablet Take 1 tablet (40 mg total) by mouth daily.  . predniSONE (DELTASONE) 5 MG tablet Take 5 mg by mouth daily with breakfast. take 1 1/2 (7.5mg ) qd  . sildenafil (VIAGRA) 100 MG tablet Take 1 tablet by mouth daily as needed.  . testosterone cypionate (DEPOTESTOTERONE CYPIONATE) 200 MG/ML injection Inject 100 mg into the muscle every 14 (fourteen) days.    . traMADol (ULTRAM) 50 MG tablet Take 1-2 tablets (50-100 mg total) by mouth every 6 (six) hours as needed for severe pain. Must last 30 days  . Vitamin D, Ergocalciferol, (DRISDOL) 50000 units CAPS capsule Take 50,000 Units by mouth once a week.     Allergies:   Borax   Social History   Tobacco Use  . Smoking status: Former Smoker    Quit date: 11/30/1995    Years since quitting: 23.4  . Smokeless tobacco: Former Systems developer    Quit date: 11/30/1995  Substance Use Topics  . Alcohol use: Yes    Alcohol/week: 0.0 standard drinks    Comment: rarely  . Drug use: No     Family Hx: The patient's family history includes Heart attack (age of onset: 57) in his father; Hypertension in his father and sister; Osteoarthritis in his mother.  ROS:   Please see the history of present illness.     All other systems reviewed and are negative.   Labs/Other Tests and Data Reviewed:    Recent Labs: 06/23/2018: ALT 40; BUN 12; Hemoglobin 15.8; Platelets 266; Potassium 4.1; Sodium 140 04/18/2019: Creatinine, Ser 0.90   Recent Lipid Panel Lab Results  Component Value Date/Time   CHOL 230 (H) 09/26/2018 09:07 AM   CHOL 239 (H) 07/23/2017 09:01 AM   TRIG 183 (H) 09/26/2018 09:07 AM   HDL 44 09/26/2018 09:07 AM   HDL 34 (L) 07/23/2017 09:01 AM   CHOLHDL 5.2 (H) 09/26/2018 09:07 AM   LDLCALC 154 (H) 09/26/2018 09:07 AM    Wt Readings from Last 3 Encounters:  04/19/19 300 lb (136.1 kg)  02/23/19 (!) 305 lb (138.3 kg)  02/02/19 (!) 300 lb 14.4 oz (136.5 kg)     Objective:    Vital Signs:  BP (!) 160/99   Pulse 65   Ht 6\' 2"  (1.88 m)   Wt 300 lb (136.1 kg)   BMI 38.52 kg/m    CONSTITUTIONAL:  Well nourished, well developed male in no acute distress.  EYES: anicteric MOUTH: oral mucosa is pink RESPIRATORY: Normal respiratory effort, symmetric expansion CARDIOVASCULAR: No peripheral edema SKIN: No rash, lesions or ulcers MUSCULOSKELETAL: no digital cyanosis NEURO: Cranial Nerves II-XII  grossly intact, moves all extremities PSYCH: Intact judgement and insight.  A&O x 3, Mood/affect appropriate   ASSESSMENT & PLAN:    1.  OSA - The pathophysiology of obstructive sleep apnea , it's cardiovascular consequences & modes of treatment including CPAP were discused with the patient in detail & they evidenced understanding.  The patient is tolerating PAP therapy well without any problems. The PAP download was reviewed today and showed an AHI of 1.2/hr on 16 cm H2O with 97% compliance in using more than 4 hours nightly.  The patient has been using and benefiting from PAP use and will continue to benefit  from therapy.   2.  HTN - BP poorly controlled.  He uses a wrist BP and unclear if it is accurate.  He thinks he forgot his pain meds last night and awakened with pain and likely the reason for the elevated.  His SBP is usually around 175mmhg.  Continue Carvedilol 6.25mg  BID, Olmesartan 40mg  daily.   3.  Obesity - his exercise is limited by severe fatigue from adrenal insuff as well as chronic pain.    COVID-19 Education: The signs and symptoms of COVID-19 were discussed with the patient and how to seek care for testing (follow up with PCP or arrange E-visit).  The importance of social distancing was discussed today.  Patient Risk:   After full review of this patient's clinical status, I feel that they are at least moderate risk at this time.  Time:   Today, I have spent 20 minutes directly with the patient on telemedicine discussing medical problems including OSA, HTN.  We also reviewed the symptoms of COVID 19 and the ways to protect against contracting the virus with telehealth technology.  I spent an additional 5 minutes reviewing patient's chart including PAP compliance download.  Medication Adjustments/Labs and Tests Ordered: Current medicines are reviewed at length with the patient today.  Concerns regarding medicines are outlined above.  Tests Ordered: No orders of the defined  types were placed in this encounter.  Medication Changes: No orders of the defined types were placed in this encounter.   Disposition:  Follow up in 1 year(s)  Signed, Fransico Him, MD  04/19/2019 8:08 AM    [facility]

## 2019-04-19 ENCOUNTER — Telehealth: Payer: Self-pay | Admitting: *Deleted

## 2019-04-19 ENCOUNTER — Telehealth (INDEPENDENT_AMBULATORY_CARE_PROVIDER_SITE_OTHER): Payer: No Typology Code available for payment source | Admitting: Cardiology

## 2019-04-19 VITALS — BP 160/99 | HR 65 | Ht 74.0 in | Wt 300.0 lb

## 2019-04-19 DIAGNOSIS — Z9989 Dependence on other enabling machines and devices: Secondary | ICD-10-CM | POA: Diagnosis not present

## 2019-04-19 DIAGNOSIS — I10 Essential (primary) hypertension: Secondary | ICD-10-CM | POA: Diagnosis not present

## 2019-04-19 DIAGNOSIS — G4733 Obstructive sleep apnea (adult) (pediatric): Secondary | ICD-10-CM

## 2019-04-19 DIAGNOSIS — E669 Obesity, unspecified: Secondary | ICD-10-CM

## 2019-04-19 NOTE — Telephone Encounter (Signed)
-----   Message from Sueanne Margarita, MD sent at 04/19/2019  8:09 AM EDT ----- Please order new supplies.

## 2019-04-19 NOTE — Patient Instructions (Signed)
Medication Instructions:  Your physician recommends that you continue on your current medications as directed. Please refer to the Current Medication list given to you today.  If you need a refill on your cardiac medications before your next appointment, please call your pharmacy.   Lab work: none If you have labs (blood work) drawn today and your tests are completely normal, you will receive your results only by: Marland Kitchen MyChart Message (if you have MyChart) OR . A paper copy in the mail If you have any lab test that is abnormal or we need to change your treatment, we will call you to review the results.  Testing/Procedures: none  Follow-Up: Your physician wants you to follow-up in: one year with Dr. Radford Pax.  You will receive a reminder letter in the mail two months in advance. If you don't receive a letter, please call our office to schedule the follow-up appointment.

## 2019-04-19 NOTE — Telephone Encounter (Signed)
Order placed to [facility] for supplies today.

## 2019-04-27 ENCOUNTER — Ambulatory Visit (INDEPENDENT_AMBULATORY_CARE_PROVIDER_SITE_OTHER): Payer: No Typology Code available for payment source | Admitting: Gastroenterology

## 2019-04-27 ENCOUNTER — Other Ambulatory Visit: Payer: Self-pay

## 2019-04-27 ENCOUNTER — Encounter: Payer: Self-pay | Admitting: Gastroenterology

## 2019-04-27 VITALS — BP 110/76 | HR 84 | Temp 98.7°F | Wt 301.4 lb

## 2019-04-27 DIAGNOSIS — K219 Gastro-esophageal reflux disease without esophagitis: Secondary | ICD-10-CM | POA: Diagnosis not present

## 2019-04-27 NOTE — Patient Instructions (Signed)

## 2019-04-30 ENCOUNTER — Encounter: Payer: Self-pay | Admitting: Pain Medicine

## 2019-04-30 NOTE — Progress Notes (Signed)
Pain Management Virtual Encounter Note - Virtual Visit via Telephone Telehealth (real-time audio visits between healthcare provider and patient).   Patient's Phone No. & Preferred Pharmacy:  [phone] (home); [phone] (mobile); (Preferred) [phone] [email or web address]  [place], [address] [place] [address] Phone: [phone] Fax: [phone]    Pre-screening note:  Our staff contacted Dean Ford and offered him an "in person", "face-to-face" appointment versus a telephone encounter. He indicated preferring the telephone encounter, at this time.   Reason for Virtual Visit: COVID-19*  Social distancing based on CDC and AMA recommendations.   I contacted Dean Ford on 05/01/2019 via telephone.      I clearly identified myself as Gaspar Cola, MD. I verified that I was speaking with the correct person using two identifiers (Name: [name], and date of birth: 09/22/1975).  Advanced Informed Consent I sought verbal advanced consent from Dean Ford for virtual visit interactions. I informed [name] of possible security and privacy concerns, risks, and limitations associated with providing "not-in-person" medical evaluation and management services. I also informed [name] of the availability of "in-person" appointments. Finally, I informed him that there would be a charge for the virtual visit and that he could be  personally, fully or partially, financially responsible for it. Mr. Stettner expressed understanding and agreed to proceed.   Historic Elements   Dean Ford is a 43 y.o. year old, male patient evaluated today after his last encounter by our practice on 02/13/2019. [name]  has a past medical history of Acute lumbar radiculopathy (Right) (S1) (05/26/2016), Acute myofascial pain (05/04/2017), Adrenal insufficiency ([place]), Anxiety, Anxiety disorder (01/28/2015), Arthritis, Back pain,  chronic (06/03/2015), Hypertension, Hypothyroid, Sleep apnea, and Thyroid disease. He also  has a past surgical history that includes Elbow surgery; Back surgery; and Colonoscopy with propofol (N/A, 02/23/2019). [name] has a current medication list which includes the following prescription(s): baclofen, carvedilol, fludrocortisone, hydrocortisone, hydroxychloroquine, levothyroxine, olmesartan, prednisone, sildenafil, testosterone cypionate, tramadol, vitamin d (ergocalciferol), and oxycodone hcl. He  reports that he quit smoking about 23 years ago. He quit smokeless tobacco use about 23 years ago. He reports current alcohol use. He reports that he does not use drugs. [name] is allergic to borax.   HPI  Today, he is being contacted for medication management.  The patient indicates that the medications are not really working as well as they used to.  His pain has advanced to the point where he is not longer working.  Today we will discontinue the tramadol and switch him to oxycodone IR.  He recently had an MRI of the lumbar spine done by Dr. Lacinda Axon to see if he would be a good candidate for a fusion.  He was told that he would not be a good candidate.  The MRI shows bilateral lumbar facet hypertrophy at multiple levels starting with the L2-3 and going all the way down to the L5-S1.  In the past we have done right-sided lumbar facet blocks and 1 bilateral, however he is currently having pain bilaterally.  In addition, the MRI shows that he has some severe right-sided L5-S1 foraminal stenosis causing possible compression of the right L5 nerve root.  However, he indicates having numbness in the legs, but his primary pain is in the lower back.  The MRI also shows enhancing scar tissue wrapped around the right S1 nerve root, which again may be responsible for  some of the symptoms that he is experiencing.  Today we spoke about several alternatives to treat this including lumbar facet blocks, followed by radiofrequency  ablation for the pain coming from the facet joints.  We also talked about a diagnostic caudal epidural steroid injection plus diagnostic epidurogram to determine if he would be a good candidate for a Racz procedure.  Today we talked about all of the benefits, as well as the possible complications, including but not limited to urinary incontinence with the Racz procedure and neuritis with the radiofrequency ablation.  We also talked about the possibility of testing whether or not he would be a good candidate for fusion by using a back brace.  I suggested putting a back brace on and determining whether or not that gives him any benefit.  If by any chance it does not or it makes things worse, that means that he would not be a good candidate for a fusion.  Here the problem is that the effusions typically will not help with facet disease.  In addition the fusions definitely will not help him with the epidural fibrosis.  Pharmacotherapy Assessment  Analgesic: Tramadol 50 mg, 2 tabs (100 mg) PO q 6 hrs (400 mg/day of tramadol).  Today I will discontinue the tramadol and we will do a trial of oxycodone IR.  I will start him on oxycodone IR 10 mg. 1 tab PO q 6 hrs (40 mg/day of oxycodone) MME/day:40 mg/day.  Today I will increase this to 60 mg/day.   Monitoring: Pharmacotherapy: No side-effects or adverse reactions reported. [place] PMP: PDMP reviewed during this encounter.       Compliance: No problems identified. Effectiveness: Clinically acceptable. Plan: Refer to "POC".  UDS:  Summary  Date Value Ref Range Status  10/20/2018 FINAL  Final    Comment:    ==================================================================== TOXASSURE SELECT 13 (MW) ==================================================================== Test                             Result       Flag       Units Drug Present and Declared for Prescription Verification   Tramadol                       >2513        EXPECTED   ng/mg creat    O-Desmethyltramadol            >2513        EXPECTED   ng/mg creat   N-Desmethyltramadol            1203         EXPECTED   ng/mg creat    Source of tramadol is a prescription medication.    O-desmethyltramadol and N-desmethyltramadol are expected    metabolites of tramadol. ==================================================================== Test                      Result    Flag   Units      Ref Range   Creatinine              199              mg/dL      >=20 ==================================================================== Declared Medications:  The flagging and interpretation on this report are based on the  following declared medications.  Unexpected results may arise from  inaccuracies in the declared medications.  **Note: The testing  scope of this panel includes these medications:  Tramadol  **Note: The testing scope of this panel does not include following  reported medications:  Baclofen  Carvedilol  Fludrocortisone  Hydrocortisone  Hydroxychloroquine  Levothyroxine  Methocarbamol  Prednisolone  Sildenafil  Testosterone  Valsartan  Vitamin D2 (Ergocalciferol) ==================================================================== For clinical consultation, please call [phone]. ====================================================================    Laboratory Chemistry Profile (12 mo)  Renal: 06/23/2018: BUN 12; BUN/Creatinine Ratio NOT APPLICABLE [id number]: Creatinine, Ser 0.90  Lab Results  Component Value Date   GFRAA 122 06/23/2018   GFRNONAA 105 06/23/2018   Hepatic: No results found for requested labs within last 8760 hours. Lab Results  Component Value Date   AST 19 06/23/2018   ALT 40 06/23/2018   Other: No results found for requested labs within last 8760 hours. Note: Above Lab results reviewed.  Imaging  Last 90 days:  Mr Lumbar Spine W Wo Contrast  Result Date: 04/19/2019 CLINICAL DATA:  Initial evaluation for chronic central low back  pain with extension into the right and left. History of prior surgery. EXAM: MRI LUMBAR SPINE WITHOUT AND WITH CONTRAST TECHNIQUE: Multiplanar and multiecho pulse sequences of the lumbar spine were obtained without and with intravenous contrast. CONTRAST:  10 cc of Gadavist. COMPARISON:  Most recent MRI from 06/10/2018. FINDINGS: Segmentation: Standard. Lowest well-formed disc labeled the L5-S1 level. Alignment: 3 mm retrolisthesis of L5 on S1. Trace retrolisthesis of L4 on L5. Mild straightening of the normal lumbar lordosis. Vertebrae: Vertebral body height maintained without evidence for acute or chronic fracture. Bone marrow signal intensity diffusely decreased on T1 weighted imaging, nonspecific, but most commonly related to anemia, smoking, or obesity. No discrete or worrisome osseous lesions. Mild reactive endplate changes present about the L5-S1 interspace. No other abnormal marrow edema or enhancement. Conus medullaris and cauda equina: Conus extends to the L1 level. Conus and cauda equina appear normal. Paraspinal and other soft tissues: Normal expected post surgical changes present within the lower posterior paraspinous soft tissues. Paraspinous soft tissues demonstrate no acute finding. Few scattered small simple cyst noted within the kidneys bilaterally. Visualized visceral structures otherwise unremarkable. Disc levels: T11-12: Unremarkable. T12-L1: Unremarkable. L1-2: Unremarkable. L2-3: Negative interspace. Mild bilateral facet hypertrophy. No stenosis or impingement. L3-4: Negative interspace. Mild facet hypertrophy. No stenosis or impingement. L4-5: Mild diffuse disc bulge with disc desiccation. Superimposed broad-based shallow left foraminal/extraforaminal disc protrusion with associated annular fissure (series 8, image 28), unchanged from previous. Mild bilateral facet hypertrophy. Borderline mild spinal stenosis, stable. Mild left greater than right L4 foraminal narrowing also unchanged. L5-S1:  Mild retrolisthesis. Chronic intervertebral disc space narrowing with diffuse disc bulge and disc desiccation. Reactive endplate changes with marginal endplate osteophytic spurring. Postoperative changes from prior right hemi laminectomy with micro discectomy. Enhancing postoperative granulation tissue surrounds the descending right S1 nerve root. No definite significant residual or recurrent disc protrusion seen on today's exam. Central canal remains patent. Severe right with mild left L5 foraminal stenosis, stable. IMPRESSION: 1. Postoperative changes from prior right hemi laminectomy and micro discectomy at L5-S1 with enhancing postoperative granulation tissue surrounding the descending right S1 nerve root. No definite residual or recurrent disc protrusion identified. 2. Persistent severe right neural foraminal stenosis at L5-S1, likely impinging upon the exiting right L5 nerve root. 3. Mild degenerative disc disease at L4-5 as above, stable. Electronically Signed   By: Jeannine Boga M.D.   On: 04/19/2019 01:59    Assessment  The primary encounter diagnosis was Chronic pain syndrome. 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  Plan of Care  I have changed Maudry Mayhew. Mcaleer's baclofen. I am also having him start on Oxycodone HCl. Additionally, I am having him maintain his hydrocortisone, testosterone cypionate, fludrocortisone, Vitamin D (Ergocalciferol), hydroxychloroquine, levothyroxine, sildenafil, carvedilol, predniSONE, olmesartan, and traMADol.  Pharmacotherapy (Medications Ordered): Meds ordered this encounter   Medications  . baclofen (LIORESAL) 10 MG tablet    Sig: Take 1 tablet (10 mg total) by mouth 4 (four) times daily.    Dispense:  120 tablet    Refill:  2    Fill one day early if pharmacy is closed on scheduled refill date. May substitute for generic if available.  . Oxycodone HCl 10 MG TABS    Sig: Take 1 tablet (10 mg total) by mouth every 6 (six) hours as needed. Must last 30 days.    Dispense:  120 tablet    Refill:  0    Chronic Pain: STOP Act (Not applicable) Fill 1 day early if closed on refill date. Do not fill until: 05/01/2019. To last until: 05/31/2019. Avoid benzodiazepines within 8 hours of opioids   Orders:  Orders Placed This Encounter  Procedures  . LUMBAR FACET(MEDIAL BRANCH NERVE BLOCK) MBNB    Standing Status:   Future    Standing Expiration Date:   05/31/2019    Scheduling Instructions:     Side: Bilateral     Level: L3-4, L4-5, & L5-S1 Facets (L2, L3, L4, L5, & S1 Medial Branch Nerves)     Sedation: Patient's choice.     Timeframe: ASAA    Order Specific Question:   Where will this procedure be performed?    Answer:   [facility]   Follow-up plan:   Return for Procedure (w/ sedation): (B) L-FCT BLK , (ASAP).      Interventional options: Considering:  NOTE: PLAQUENIL ANTICOAGULATION  Diagnostic bilateral lumbar facet block #2  Possible bilateral lumbar facet RFA  Diagnostic caudal ESI + diagnostic epidurogram  Possible Racz procedure    Palliative PRN treatment(s):  Palliative Caudal ESI  Palliative right L5-S1 LESI  Palliative rightlumbar facet block #5  Diagnostic left lumbar facet block #2  Palliative right lumbar facet RFA  Palliative right SI joint block #2  Palliative right-sided IA hip joint injection Palliative Lumbar TPI/MNB #2     Recent Visits Date Type Provider Dept  02/13/19 Office Visit Milinda Pointer, MD [facility]  Showing recent visits within past 90 days and meeting all other requirements   Today's  Visits Date Type Provider Dept  05/01/19 Office Visit Milinda Pointer, MD [facility]  Showing today's visits and meeting all other requirements   Future Appointments No visits were found meeting these conditions.  Showing future appointments within next 90 days and meeting all other requirements   I discussed the assessment and treatment plan with the patient. The patient was provided an opportunity to ask questions and all were answered. The patient agreed with the plan and demonstrated an understanding of the instructions.  Patient advised to call back or seek an in-person evaluation if the symptoms or condition worsens.  Total duration of non-face-to-face encounter: 25 minutes.  Note by: Beatriz Chancellor  Farrel Conners, MD Date: 05/01/2019; Time: 11:05 AM  Note: This dictation was prepared with Dragon dictation. Any transcriptional errors that may result from this process are unintentional.  Disclaimer:  * Given the special circumstances of the COVID-19 pandemic, the federal government has announced that the Office for Civil Rights (OCR) will exercise its enforcement discretion and will not impose penalties on physicians using telehealth in the event of noncompliance with regulatory requirements under the [place] and [place] (HIPAA) in connection with the good faith provision of telehealth during the XX123456 national public health emergency. ([place])

## 2019-04-30 NOTE — Progress Notes (Signed)
[name], MD [address]  [place]  [place], [place] [address]  Main: [phone]  Fax: [phone]   Primary Care Physician: Hubbard Hartshorn, FNP   Chief Complaint  Patient presents with  . Follow-up    Patient had colonoscopy on 02/23/2019    HPI: Dean Ford is a 43 y.o. male who had a screening colonoscopy with 1 small polyp removed and showing tubular adenoma.  Repeat recommended in 5 years.  Presents for evaluation of GERD symptoms.  No dysphagia, weight loss, nausea vomiting, change in bowel habits or any other alarm symptoms presents.  Reports symptoms occurring 3-4 times a week.  Does not take any daily medications for it.  Current Outpatient Medications  Medication Sig Dispense Refill  . baclofen (LIORESAL) 10 MG tablet Take 1 tablet (10 mg total) by mouth 3 (three) times daily. 90 tablet 2  . carvedilol (COREG) 6.25 MG tablet TAKE 1 TABLET BY MOUTH TWO TIMES DAILY 180 tablet 3  . fludrocortisone (FLORINEF) 0.1 MG tablet Take 0.1 mg by mouth daily as needed for dizziness.    . hydrocortisone (CORTEF) 10 MG tablet Take 10 mg by mouth daily.     . hydroxychloroquine (PLAQUENIL) 200 MG tablet Take 1 tablet by mouth daily.  3  . levothyroxine (SYNTHROID, LEVOTHROID) 100 MCG tablet Take 1 tablet by mouth daily.  3  . olmesartan (BENICAR) 40 MG tablet Take 1 tablet (40 mg total) by mouth daily. 30 tablet 11  . predniSONE (DELTASONE) 5 MG tablet Take 5 mg by mouth daily with breakfast. take 1 1/2 (7.5mg ) qd    . sildenafil (VIAGRA) 100 MG tablet Take 1 tablet by mouth daily as needed.  12  . testosterone cypionate (DEPOTESTOTERONE CYPIONATE) 200 MG/ML injection Inject 100 mg into the muscle every 14 (fourteen) days.     . traMADol (ULTRAM) 50 MG tablet Take 1-2 tablets (50-100 mg total) by mouth every 6 (six) hours as needed for severe pain. Must last 30 days 240 tablet 2  . Vitamin D, Ergocalciferol, (DRISDOL) 50000 units CAPS capsule Take 50,000 Units by  mouth once a week.  2   No current facility-administered medications for this visit.     Allergies as of 04/27/2019 - Review Complete 04/27/2019  Allergen Reaction Noted  . Borax Rash 06/03/2015    ROS:  General: Negative for anorexia, weight loss, fever, chills, fatigue, weakness. ENT: Negative for hoarseness, difficulty swallowing , nasal congestion. CV: Negative for chest pain, angina, palpitations, dyspnea on exertion, peripheral edema.  Respiratory: Negative for dyspnea at rest, dyspnea on exertion, cough, sputum, wheezing.  GI: See history of present illness. GU:  Negative for dysuria, hematuria, urinary incontinence, urinary frequency, nocturnal urination.  Endo: Negative for unusual weight change.    Physical Examination:   BP 110/76 (BP Location: Left Arm, Patient Position: Sitting, Cuff Size: Normal)   Pulse 84   Temp 98.7 F (37.1 C) (Oral)   Wt (!) 301 lb 6 oz (136.7 kg)   BMI 38.69 kg/m   General: Well-nourished, well-developed in no acute distress.  Eyes: No icterus. Conjunctivae pink. Mouth: Oropharyngeal mucosa moist and pink , no lesions erythema or exudate. Neck: Supple, Trachea midline Abdomen: Bowel sounds are normal, nontender, nondistended, no hepatosplenomegaly or masses, no abdominal bruits or hernia , no rebound or guarding.   Extremities: No lower extremity edema. No clubbing or deformities. Neuro: Alert and oriented x 3.  Grossly intact. Skin: Warm and dry, no jaundice.  Psych: Alert and cooperative, normal mood and affect.   Labs: CMP     Component Value Date/Time   NA 140 06/23/2018 1111   NA 142 07/23/2017 0901   NA 139 12/04/2014 0839   K 4.1 06/23/2018 1111   K 4.0 12/04/2014 0839   CL 104 06/23/2018 1111   CL 104 12/04/2014 0839   CO2 26 06/23/2018 1111   CO2 28 12/04/2014 0839   GLUCOSE 107 06/23/2018 1111   GLUCOSE 101 (H) 12/04/2014 0839   BUN 12 06/23/2018 1111   BUN 11 07/23/2017 0901   BUN 13 12/04/2014 0839    CREATININE 0.90 04/18/2019 1758   CREATININE 0.88 06/23/2018 1111   CALCIUM 9.3 06/23/2018 1111   CALCIUM 8.6 (L) 12/04/2014 0839   PROT 6.6 06/23/2018 1111   PROT 6.6 07/23/2017 0901   PROT 7.0 01/15/2012 1436   ALBUMIN 4.8 03/15/2018 1329   ALBUMIN 4.5 07/23/2017 0901   ALBUMIN 3.6 01/15/2012 1436   AST 19 06/23/2018 1111   AST 23 01/15/2012 1436   ALT 40 06/23/2018 1111   ALT 38 01/15/2012 1436   ALKPHOS 46 03/15/2018 1329   ALKPHOS 47 (L) 01/15/2012 1436   BILITOT 0.3 06/23/2018 1111   BILITOT 0.3 07/23/2017 0901   BILITOT 0.4 01/15/2012 1436   GFRNONAA 105 06/23/2018 1111   GFRAA 122 06/23/2018 1111   Lab Results  Component Value Date   WBC 8.0 06/23/2018   HGB 15.8 06/23/2018   HCT 44.4 06/23/2018   MCV 84.4 06/23/2018   PLT 266 06/23/2018    Imaging Studies: Mr Lumbar Spine W Wo Contrast  Result Date: 04/19/2019 CLINICAL DATA:  Initial evaluation for chronic central low back pain with extension into the right and left. History of prior surgery. EXAM: MRI LUMBAR SPINE WITHOUT AND WITH CONTRAST TECHNIQUE: Multiplanar and multiecho pulse sequences of the lumbar spine were obtained without and with intravenous contrast. CONTRAST:  10 cc of Gadavist. COMPARISON:  Most recent MRI from 06/10/2018. FINDINGS: Segmentation: Standard. Lowest well-formed disc labeled the L5-S1 level. Alignment: 3 mm retrolisthesis of L5 on S1. Trace retrolisthesis of L4 on L5. Mild straightening of the normal lumbar lordosis. Vertebrae: Vertebral body height maintained without evidence for acute or chronic fracture. Bone marrow signal intensity diffusely decreased on T1 weighted imaging, nonspecific, but most commonly related to anemia, smoking, or obesity. No discrete or worrisome osseous lesions. Mild reactive endplate changes present about the L5-S1 interspace. No other abnormal marrow edema or enhancement. Conus medullaris and cauda equina: Conus extends to the L1 level. Conus and cauda equina appear  normal. Paraspinal and other soft tissues: Normal expected post surgical changes present within the lower posterior paraspinous soft tissues. Paraspinous soft tissues demonstrate no acute finding. Few scattered small simple cyst noted within the kidneys bilaterally. Visualized visceral structures otherwise unremarkable. Disc levels: T11-12: Unremarkable. T12-L1: Unremarkable. L1-2: Unremarkable. L2-3: Negative interspace. Mild bilateral facet hypertrophy. No stenosis or impingement. L3-4: Negative interspace. Mild facet hypertrophy. No stenosis or impingement. L4-5: Mild diffuse disc bulge with disc desiccation. Superimposed broad-based shallow left foraminal/extraforaminal disc protrusion with associated annular fissure (series 8, image 28), unchanged from previous. Mild bilateral facet hypertrophy. Borderline mild spinal stenosis, stable. Mild left greater than right L4 foraminal narrowing also unchanged. L5-S1: Mild retrolisthesis. Chronic intervertebral disc space narrowing with diffuse disc bulge and disc desiccation. Reactive endplate changes with marginal endplate osteophytic spurring. Postoperative changes from prior right hemi laminectomy with micro discectomy. Enhancing postoperative granulation tissue surrounds the descending right S1  nerve root. No definite significant residual or recurrent disc protrusion seen on today's exam. Central canal remains patent. Severe right with mild left L5 foraminal stenosis, stable. IMPRESSION: 1. Postoperative changes from prior right hemi laminectomy and micro discectomy at L5-S1 with enhancing postoperative granulation tissue surrounding the descending right S1 nerve root. No definite residual or recurrent disc protrusion identified. 2. Persistent severe right neural foraminal stenosis at L5-S1, likely impinging upon the exiting right L5 nerve root. 3. Mild degenerative disc disease at L4-5 as above, stable. Electronically Signed   By: Jeannine Boga M.D.   On:  04/19/2019 01:59    Assessment and Plan:   Dean Ford is a 43 y.o. y/o male here to discuss GERD symptoms  Lifestyle modifications including weight loss and using a bed wedge encouraged Patient educated extensively on acid reflux lifestyle modification, including buying a bed wedge, not eating 3 hrs before bedtime, diet modifications, and handout given for the same.   Taking Pepcid once daily for the next 30 days over-the-counter also discussed patient would like to pick this up over-the-counter instead of getting a prescription.  If symptoms do not resolve, I have asked him to contact us and he verbalized understanding   No alarm symptoms present  patient not interested in upper endoscopy at this time    Dr [name]

## 2019-05-01 ENCOUNTER — Encounter: Payer: Self-pay | Admitting: Pain Medicine

## 2019-05-01 ENCOUNTER — Ambulatory Visit: Payer: No Typology Code available for payment source | Attending: Pain Medicine | Admitting: Pain Medicine

## 2019-05-01 ENCOUNTER — Other Ambulatory Visit: Payer: Self-pay

## 2019-05-01 DIAGNOSIS — G894 Chronic pain syndrome: Secondary | ICD-10-CM

## 2019-05-01 DIAGNOSIS — M79604 Pain in right leg: Secondary | ICD-10-CM

## 2019-05-01 DIAGNOSIS — M5441 Lumbago with sciatica, right side: Secondary | ICD-10-CM

## 2019-05-01 DIAGNOSIS — G9619 Other disorders of meninges, not elsewhere classified: Secondary | ICD-10-CM

## 2019-05-01 DIAGNOSIS — M79605 Pain in left leg: Secondary | ICD-10-CM

## 2019-05-01 DIAGNOSIS — M961 Postlaminectomy syndrome, not elsewhere classified: Secondary | ICD-10-CM | POA: Diagnosis not present

## 2019-05-01 DIAGNOSIS — M7918 Myalgia, other site: Secondary | ICD-10-CM

## 2019-05-01 DIAGNOSIS — M47816 Spondylosis without myelopathy or radiculopathy, lumbar region: Secondary | ICD-10-CM

## 2019-05-01 DIAGNOSIS — G8929 Other chronic pain: Secondary | ICD-10-CM

## 2019-05-01 DIAGNOSIS — G96198 Other disorders of meninges, not elsewhere classified: Secondary | ICD-10-CM | POA: Insufficient documentation

## 2019-05-01 MED ORDER — BACLOFEN 10 MG PO TABS: 10.0000 mg | ORAL_TABLET | Freq: Four times a day (QID) | ORAL | 2 refills | Status: DC

## 2019-05-01 MED ORDER — OXYCODONE HCL 10 MG PO TABS: 10.0000 mg | ORAL_TABLET | Freq: Four times a day (QID) | ORAL | 0 refills | Status: DC | PRN

## 2019-05-01 NOTE — Patient Instructions (Signed)

## 2019-05-15 NOTE — Telephone Encounter (Signed)
appointment

## 2019-05-22 NOTE — Progress Notes (Signed)
Patient's Name: Dean Ford  MRN: [id number]  Referring Provider: Milinda Pointer, MD  DOB: 04/14/1976  PCP: Hubbard Hartshorn, FNP  DOS: 05/23/2019  Note by: Gaspar Cola, MD  Service setting: Ambulatory outpatient  Specialty: Interventional Pain Management  Patient type: Established  Location: [facility] (AMB) Pain Management Facility  Visit type: Interventional Procedure   Primary Reason for Visit: Interventional Pain Management Treatment. CC: Back Pain (low)  Procedure:          Anesthesia, Analgesia, Anxiolysis:  Type: Lumbar Facet, Medial Branch Block(s) R5/L2  Primary Purpose: Diagnostic Region: Posterolateral Lumbosacral Spine Level: L2, L3, L4, L5, & S1 Medial Branch Level(s). Injecting these levels blocks the L3-4, L4-5, and L5-S1 lumbar facet joints. Laterality: Bilateral  Type: Moderate (Conscious) Sedation combined with Local Anesthesia Indication(s): Analgesia and Anxiety Route: Intravenous (IV) IV Access: Secured Sedation: Meaningful verbal contact was maintained at all times during the procedure  Local Anesthetic: Lidocaine 1-2%  Position: Prone   Indications: 1. Lumbar facet syndrome (Bilateral) (R>L)   2. Spondylosis without myelopathy or radiculopathy, lumbosacral region   3. Lumbar facet hypertrophy (Multilevel)   4. DDD (degenerative disc disease), lumbar   5. Chronic low back pain (Primary Area of Pain) (Bilateral) (R>L)    Pain Score: Pre-procedure: 5 /10 Post-procedure: 6 /10   Pre-op Assessment:  Dean Ford is a 43 y.o. (year old), male patient, seen today for interventional treatment. He  has a past surgical history that includes Elbow surgery; Back surgery; Colonoscopy with propofol (N/A, 02/23/2019); and discectomy (N/A, 2017, 2018, 2019). Dean Ford has a current medication list which includes the following prescription(s): baclofen, carvedilol, fludrocortisone, hydrocortisone, hydroxychloroquine, levothyroxine, olmesartan, oxycodone hcl, prednisone,  sildenafil, testosterone cypionate, vitamin d (ergocalciferol), hydrocortisone, and tramadol, and the following Facility-Administered Medications: fentanyl and midazolam. His primarily concern today is the Back Pain (low)  Initial Vital Signs:  Pulse/HCG Rate: 81ECG Heart Rate: 69 Temp: 98.4 F (36.9 C) Resp: 18 BP: 129/83 SpO2: 97 %  BMI: Estimated body mass index is 38.52 kg/m as calculated from the following:   Height as of this encounter: 6\' 2"  (1.88 m).   Weight as of this encounter: 300 lb (136.1 kg).  Risk Assessment: Allergies: Reviewed. He is allergic to borax.  Allergy Precautions: None required Coagulopathies: Reviewed. None identified.  Blood-thinner therapy: None at this time Active Infection(s): Reviewed. None identified. Dean Ford is afebrile  Site Confirmation: Dean Ford was asked to confirm the procedure and laterality before marking the site Procedure checklist: Completed Consent: Before the procedure and under the influence of no sedative(s), amnesic(s), or anxiolytics, the patient was informed of the treatment options, risks and possible complications. To fulfill our ethical and legal obligations, as recommended by the American Medical Association's Code of Ethics, I have informed the patient of my clinical impression; the nature and purpose of the treatment or procedure; the risks, benefits, and possible complications of the intervention; the alternatives, including doing nothing; the risk(s) and benefit(s) of the alternative treatment(s) or procedure(s); and the risk(s) and benefit(s) of doing nothing. The patient was provided information about the general risks and possible complications associated with the procedure. These may include, but are not limited to: failure to achieve desired goals, infection, bleeding, organ or nerve damage, allergic reactions, paralysis, and death. In addition, the patient was informed of those risks and complications associated to  Spine-related procedures, such as failure to decrease pain; infection (i.e.: Meningitis, epidural or intraspinal abscess); bleeding (i.e.: epidural hematoma, subarachnoid hemorrhage, or  any other type of intraspinal or peri-dural bleeding); organ or nerve damage (i.e.: Any type of peripheral nerve, nerve root, or spinal cord injury) with subsequent damage to sensory, motor, and/or autonomic systems, resulting in permanent pain, numbness, and/or weakness of one or several areas of the body; allergic reactions; (i.e.: anaphylactic reaction); and/or death. Furthermore, the patient was informed of those risks and complications associated with the medications. These include, but are not limited to: allergic reactions (i.e.: anaphylactic or anaphylactoid reaction(s)); adrenal axis suppression; blood sugar elevation that in diabetics may result in ketoacidosis or comma; water retention that in patients with history of congestive heart failure may result in shortness of breath, pulmonary edema, and decompensation with resultant heart failure; weight gain; swelling or edema; medication-induced neural toxicity; particulate matter embolism and blood vessel occlusion with resultant organ, and/or nervous system infarction; and/or aseptic necrosis of one or more joints. Finally, the patient was informed that Medicine is not an exact science; therefore, there is also the possibility of unforeseen or unpredictable risks and/or possible complications that may result in a catastrophic outcome. The patient indicated having understood very clearly. We have given the patient no guarantees and we have made no promises. Enough time was given to the patient to ask questions, all of which were answered to the patient's satisfaction. Dean Ford has indicated that he wanted to continue with the procedure. Attestation: I, the ordering provider, attest that I have discussed with the patient the benefits, risks, side-effects, alternatives,  likelihood of achieving goals, and potential problems during recovery for the procedure that I have provided informed consent. Date   Time: 05/23/2019 10:45 AM  Pre-Procedure Preparation:  Monitoring: As per clinic protocol. Respiration, ETCO2, SpO2, BP, heart rate and rhythm monitor placed and checked for adequate function Safety Precautions: Patient was assessed for positional comfort and pressure points before starting the procedure. Time-out: I initiated and conducted the "Time-out" before starting the procedure, as per protocol. The patient was asked to participate by confirming the accuracy of the "Time Out" information. Verification of the correct person, site, and procedure were performed and confirmed by me, the nursing staff, and the patient. "Time-out" conducted as per Joint Commission's Universal Protocol (UP.01.01.01). Time: 1202  Description of Procedure:          Laterality: Bilateral. The procedure was performed in identical fashion on both sides. Levels:  L2, L3, L4, L5, & S1 Medial Branch Level(s) Area Prepped: Posterior Lumbosacral Region Prepping solution: DuraPrep (Iodine Povacrylex [0.7% available iodine] and Isopropyl Alcohol, 74% w/w) Safety Precautions: Aspiration looking for blood return was conducted prior to all injections. At no point did we inject any substances, as a needle was being advanced. Before injecting, the patient was told to immediately notify me if he was experiencing any new onset of "ringing in the ears, or metallic taste in the mouth". No attempts were made at seeking any paresthesias. Safe injection practices and needle disposal techniques used. Medications properly checked for expiration dates. SDV (single dose vial) medications used. After the completion of the procedure, all disposable equipment used was discarded in the proper designated medical waste containers. Local Anesthesia: Protocol guidelines were followed. The patient was positioned over the  fluoroscopy table. The area was prepped in the usual manner. The time-out was completed. The target area was identified using fluoroscopy. A 12-in long, straight, sterile hemostat was used with fluoroscopic guidance to locate the targets for each level blocked. Once located, the skin was marked with an approved surgical skin  marker. Once all sites were marked, the skin (epidermis, dermis, and hypodermis), as well as deeper tissues (fat, connective tissue and muscle) were infiltrated with a small amount of a short-acting local anesthetic, loaded on a 10cc syringe with a 25G, 1.5-in  Needle. An appropriate amount of time was allowed for local anesthetics to take effect before proceeding to the next step. Local Anesthetic: Lidocaine 2.0% The unused portion of the local anesthetic was discarded in the proper designated containers. Technical explanation of process:  L2 Medial Branch Nerve Block (MBB): The target area for the L2 medial branch is at the junction of the postero-lateral aspect of the superior articular process and the superior, posterior, and medial edge of the transverse process of L3. Under fluoroscopic guidance, a Quincke needle was inserted until contact was made with os over the superior postero-lateral aspect of the pedicular shadow (target area). After negative aspiration for blood, 0.5 mL of the nerve block solution was injected without difficulty or complication. The needle was removed intact. L3 Medial Branch Nerve Block (MBB): The target area for the L3 medial branch is at the junction of the postero-lateral aspect of the superior articular process and the superior, posterior, and medial edge of the transverse process of L4. Under fluoroscopic guidance, a Quincke needle was inserted until contact was made with os over the superior postero-lateral aspect of the pedicular shadow (target area). After negative aspiration for blood, 0.5 mL of the nerve block solution was injected without difficulty  or complication. The needle was removed intact. L4 Medial Branch Nerve Block (MBB): The target area for the L4 medial branch is at the junction of the postero-lateral aspect of the superior articular process and the superior, posterior, and medial edge of the transverse process of L5. Under fluoroscopic guidance, a Quincke needle was inserted until contact was made with os over the superior postero-lateral aspect of the pedicular shadow (target area). After negative aspiration for blood, 0.5 mL of the nerve block solution was injected without difficulty or complication. The needle was removed intact. L5 Medial Branch Nerve Block (MBB): The target area for the L5 medial branch is at the junction of the postero-lateral aspect of the superior articular process and the superior, posterior, and medial edge of the sacral ala. Under fluoroscopic guidance, a Quincke needle was inserted until contact was made with os over the superior postero-lateral aspect of the pedicular shadow (target area). After negative aspiration for blood, 0.5 mL of the nerve block solution was injected without difficulty or complication. The needle was removed intact. S1 Medial Branch Nerve Block (MBB): The target area for the S1 medial branch is at the posterior and inferior 6 o'clock position of the L5-S1 facet joint. Under fluoroscopic guidance, the Quincke needle inserted for the L5 MBB was redirected until contact was made with os over the inferior and postero aspect of the sacrum, at the 6 o' clock position under the L5-S1 facet joint (Target area). After negative aspiration for blood, 0.5 mL of the nerve block solution was injected without difficulty or complication. The needle was removed intact.  Nerve block solution: 0.2% PF-Ropivacaine + Triamcinolone (40 mg/mL) diluted to a final concentration of 4 mg of Triamcinolone/mL of Ropivacaine The unused portion of the solution was discarded in the proper designated containers. Procedural  Needles: 22-gauge, 3.5-inch, Quincke needles used for all levels.  Once the entire procedure was completed, the treated area was cleaned, making sure to leave some of the prepping solution back to take advantage  of its long term bactericidal properties.   Illustration of the posterior view of the lumbar spine and the posterior neural structures. Laminae of L2 through S1 are labeled. DPRL5, dorsal primary ramus of L5; DPRS1, dorsal primary ramus of S1; DPR3, dorsal primary ramus of L3; FJ, facet (zygapophyseal) joint L3-L4; I, inferior articular process of L4; LB1, lateral branch of dorsal primary ramus of L1; IAB, inferior articular branches from L3 medial branch (supplies L4-L5 facet joint); IBP, intermediate branch plexus; MB3, medial branch of dorsal primary ramus of L3; NR3, third lumbar nerve root; S, superior articular process of L5; SAB, superior articular branches from L4 (supplies L4-5 facet joint also); TP3, transverse process of L3.  Vitals:   05/23/19 1216 05/23/19 1226 05/23/19 1236 05/23/19 1245  BP: 123/86 127/65 (!) 121/58 (!) 119/58  Pulse:      Resp: 11 14 14 14   Temp:  98.1 F (36.7 C)    SpO2: 97% 95% 98% 96%  Weight:      Height:         Start Time: 1202 hrs. End Time: 1215 hrs.  Imaging Guidance (Spinal):          Type of Imaging Technique: Fluoroscopy Guidance (Spinal) Indication(s): Assistance in needle guidance and placement for procedures requiring needle placement in or near specific anatomical locations not easily accessible without such assistance. Exposure Time: Please see nurses notes. Contrast: None used. Fluoroscopic Guidance: I was personally present during the use of fluoroscopy. "Tunnel Vision Technique" used to obtain the best possible view of the target area. Parallax error corrected before commencing the procedure. "Direction-depth-direction" technique used to introduce the needle under continuous pulsed fluoroscopy. Once target was reached,  antero-posterior, oblique, and lateral fluoroscopic projection used confirm needle placement in all planes. Images permanently stored in EMR. Interpretation: No contrast injected. I personally interpreted the imaging intraoperatively. Adequate needle placement confirmed in multiple planes. Permanent images saved into the patient's record.  Antibiotic Prophylaxis:   Anti-infectives (From admission, onward)   None     Indication(s): None identified  Post-operative Assessment:  Post-procedure Vital Signs:  Pulse/HCG Rate: 8176 Temp: 98.1 F (36.7 C) Resp: 14 BP: (!) 119/58 SpO2: 96 %  EBL: None  Complications: No immediate post-treatment complications observed by team, or reported by patient.  Note: The patient tolerated the entire procedure well. A repeat set of vitals were taken after the procedure and the patient was kept under observation following institutional policy, for this type of procedure. Post-procedural neurological assessment was performed, showing return to baseline, prior to discharge. The patient was provided with post-procedure discharge instructions, including a section on how to identify potential problems. Should any problems arise concerning this procedure, the patient was given instructions to immediately contact us, at any time, without hesitation. In any case, we plan to contact the patient by telephone for a follow-up status report regarding this interventional procedure.  Comments:  No additional relevant information.  Plan of Care  Orders:  Orders Placed This Encounter  Procedures   LUMBAR FACET(MEDIAL BRANCH NERVE BLOCK) MBNB    Scheduling Instructions:     Side: Bilateral     Level: L3-4, L4-5, & L5-S1 Facets (L2, L3, L4, L5, & S1 Medial Branch Nerves)     Sedation: With Sedation.     Timeframe: Today    Order Specific Question:   Where will this procedure be performed?    Answer:   [facility]   DG [facility] C-ARM 1-60 MIN NO REPORT  Intraoperative interpretation by procedural physician at [facility].    Standing Status:   Standing    Number of Occurrences:   1    Order Specific Question:   Reason for exam:    Answer:   Assistance in needle guidance and placement for procedures requiring needle placement in or near specific anatomical locations not easily accessible without such assistance.   Informed Consent Details: Physician/Practitioner Attestation; Transcribe to consent form and obtain patient signature    Provider Attestation: I, Verona Dossie Arbour, MD, (Pain Management Specialist), the physician/practitioner, attest that I have discussed with the patient the benefits, risks, side effects, alternatives, likelihood of achieving goals and potential problems during recovery for the procedure that I have provided informed consent.    Scheduling Instructions:     Procedure: Lumbar Facet Block  under fluoroscopic guidance     Indication(s): Low Back Pain, with our without leg pain, due to Facet Joint Arthralgia (Joint Pain) known as Lumbar Facet Syndrome, secondary to Lumbar, and/or Lumbosacral Spondylosis (Arthritis of the Spine), without myelopathy or radiculopathy (Nerve Damage).     Note: Always confirm laterality of pain with Mr. Shuey, before procedure.   Provide equipment / supplies at bedside    Equipment required: Single use, disposable, "Block Tray"    Standing Status:   Standing    Number of Occurrences:   1    Order Specific Question:   Specify    Answer:   Block Tray   Chronic Opioid Analgesic:  Tramadol 50 mg, 2 tabs (100 mg) PO q 6 hrs (400 mg/day of tramadol).  Today I will discontinue the tramadol and we will do a trial of oxycodone IR.  I will start him on oxycodone IR 10 mg. 1 tab PO q 6 hrs (40 mg/day of oxycodone) MME/day:40 mg/day.  Today I will increase this to 60 mg/day.   Medications ordered for procedure: Meds ordered this encounter  Medications   lidocaine (XYLOCAINE) 2 % (with  pres) injection 400 mg   lactated ringers infusion 1,000 mL   midazolam (VERSED) 5 MG/5ML injection 1-2 mg    Make sure Flumazenil is available in the pyxis when using this medication. If oversedation occurs, administer 0.2 mg IV over 15 sec. If after 45 sec no response, administer 0.2 mg again over 1 min; may repeat at 1 min intervals; not to exceed 4 doses (1 mg)   fentaNYL (SUBLIMAZE) injection 25-50 mcg    Make sure Narcan is available in the pyxis when using this medication. In the event of respiratory depression (RR< 8/min): Titrate NARCAN (naloxone) in increments of 0.1 to 0.2 mg IV at 2-3 minute intervals, until desired degree of reversal.   ropivacaine (PF) 2 mg/mL (0.2%) (NAROPIN) injection 18 mL   triamcinolone acetonide (KENALOG-40) injection 80 mg   Medications administered: We administered lidocaine, lactated ringers, midazolam, fentaNYL, ropivacaine (PF) 2 mg/mL (0.2%), and triamcinolone acetonide.  See the medical record for exact dosing, route, and time of administration.  Follow-up plan:   Return in about 2 weeks (around 06/06/2019) for (VV), (PP).       Interventional options: Considering:  NOTE: PLAQUENIL ANTICOAGULATION  Diagnostic bilateral lumbar facet block #2  Possible bilateral lumbar facet RFA  Diagnostic caudal ESI + diagnostic epidurogram  Possible Racz procedure    Palliative PRN treatment(s):  Palliative Caudal ESI  Palliative right L5-S1 LESI  Palliative rightlumbar facet block #5  Diagnostic left lumbar facet block #2  Palliative right lumbar facet RFA  Palliative right SI joint block #2  Palliative right-sided IA hip joint injection Palliative Lumbar TPI/MNB #2      Recent Visits Date Type Provider Dept  05/01/19 Office Visit Milinda Pointer, MD [facility]  Showing recent visits within past 90 days and meeting all other requirements   Today's Visits Date Type Provider Dept  05/23/19 Procedure visit Milinda Pointer,  MD [facility]  Showing today's visits and meeting all other requirements   Future Appointments Date Type Provider Dept  05/29/19 Appointment Milinda Pointer, MD [facility]  06/12/19 Appointment Milinda Pointer, MD [facility]  Showing future appointments within next 90 days and meeting all other requirements   Disposition: Discharge home  Discharge Date & Time: 05/23/2019; 1250 hrs.   Primary Care Physician: Hubbard Hartshorn, FNP Location: [facility] Note by: Gaspar Cola, MD Date: 05/23/2019; Time: 12:48 PM  Disclaimer:  Medicine is not an Chief Strategy Officer. The only guarantee in medicine is that nothing is guaranteed. It is important to note that the decision to proceed with this intervention was based on the information collected from the patient. The Data and conclusions were drawn from the patient's questionnaire, the interview, and the physical examination. Because the information was provided in large part by the patient, it cannot be guaranteed that it has not been purposely or unconsciously manipulated. Every effort has been made to obtain as much relevant data as possible for this evaluation. It is important to note that the conclusions that lead to this procedure are derived in large part from the available data. Always take into account that the treatment will also be dependent on availability of resources and existing treatment guidelines, considered by other Pain Management Practitioners as being common knowledge and practice, at the time of the intervention. For Medico-Legal purposes, it is also important to point out that variation in procedural techniques and pharmacological choices are the acceptable norm. The indications, contraindications, technique, and results of the above procedure should only be interpreted and judged by a Board-Certified Interventional Pain Specialist with extensive familiarity and expertise  in the same exact procedure and technique.

## 2019-05-23 ENCOUNTER — Encounter: Payer: Self-pay | Admitting: Pain Medicine

## 2019-05-23 ENCOUNTER — Ambulatory Visit
Admission: RE | Admit: 2019-05-23 | Discharge: 2019-05-23 | Disposition: A | Discharge status: Home / Self Care | Payer: No Typology Code available for payment source | Source: Ambulatory Visit | Attending: Pain Medicine | Admitting: Pain Medicine

## 2019-05-23 ENCOUNTER — Ambulatory Visit (HOSPITAL_BASED_OUTPATIENT_CLINIC_OR_DEPARTMENT_OTHER): Payer: No Typology Code available for payment source | Admitting: Pain Medicine

## 2019-05-23 ENCOUNTER — Other Ambulatory Visit: Payer: Self-pay

## 2019-05-23 VITALS — BP 119/58 | HR 81 | Temp 98.1°F | Resp 14 | Ht 74.0 in | Wt 300.0 lb

## 2019-05-23 DIAGNOSIS — M47816 Spondylosis without myelopathy or radiculopathy, lumbar region: Secondary | ICD-10-CM

## 2019-05-23 DIAGNOSIS — G8929 Other chronic pain: Secondary | ICD-10-CM | POA: Diagnosis present

## 2019-05-23 DIAGNOSIS — M47817 Spondylosis without myelopathy or radiculopathy, lumbosacral region: Secondary | ICD-10-CM | POA: Insufficient documentation

## 2019-05-23 DIAGNOSIS — M5136 Other intervertebral disc degeneration, lumbar region: Secondary | ICD-10-CM

## 2019-05-23 DIAGNOSIS — M5441 Lumbago with sciatica, right side: Secondary | ICD-10-CM | POA: Diagnosis present

## 2019-05-23 MED ORDER — ROPIVACAINE HCL 2 MG/ML IJ SOLN
INTRAMUSCULAR | Status: AC
  Filled 2019-05-23: qty 10

## 2019-05-23 MED ORDER — FENTANYL CITRATE (PF) 100 MCG/2ML IJ SOLN
25.0000 ug | INTRAMUSCULAR | Status: DC | PRN
  Administered 2019-05-23: 100 ug via INTRAVENOUS
  Filled 2019-05-23: qty 2

## 2019-05-23 MED ORDER — ROPIVACAINE HCL 2 MG/ML IJ SOLN
18.0000 mL | Freq: Once | INTRAMUSCULAR | Status: AC
  Administered 2019-05-23: 12:00:00 10 mL via PERINEURAL
  Filled 2019-05-23: qty 20

## 2019-05-23 MED ORDER — MIDAZOLAM HCL 5 MG/5ML IJ SOLN
1.0000 mg | INTRAMUSCULAR | Status: DC | PRN
  Administered 2019-05-23: 12:00:00 4 mg via INTRAVENOUS
  Filled 2019-05-23: qty 5

## 2019-05-23 MED ORDER — LIDOCAINE HCL 2 % IJ SOLN
20.0000 mL | Freq: Once | INTRAMUSCULAR | Status: AC
  Administered 2019-05-23: 400 mg
  Filled 2019-05-23: qty 20

## 2019-05-23 MED ORDER — TRIAMCINOLONE ACETONIDE 40 MG/ML IJ SUSP
80.0000 mg | Freq: Once | INTRAMUSCULAR | Status: AC
  Administered 2019-05-23: 12:00:00 40 mg
  Filled 2019-05-23: qty 2

## 2019-05-23 MED ORDER — LACTATED RINGERS IV SOLN
1000.0000 mL | Freq: Once | INTRAVENOUS | Status: AC
  Administered 2019-05-23: 1000 mL via INTRAVENOUS

## 2019-05-23 MED ORDER — TRIAMCINOLONE ACETONIDE 40 MG/ML IJ SUSP
INTRAMUSCULAR | Status: AC
  Filled 2019-05-23: qty 1

## 2019-05-23 NOTE — Progress Notes (Signed)
Safety precautions to be maintained throughout the outpatient stay will include: orient to surroundings, keep bed in low position, maintain call bell within reach at all times, provide assistance with transfer out of bed and ambulation.  

## 2019-05-23 NOTE — Patient Instructions (Signed)

## 2019-05-24 ENCOUNTER — Telehealth: Payer: Self-pay | Admitting: *Deleted

## 2019-05-24 NOTE — Telephone Encounter (Signed)
No problems post procedure. 

## 2019-05-25 ENCOUNTER — Encounter: Payer: Self-pay | Admitting: Pain Medicine

## 2019-05-27 NOTE — Progress Notes (Signed)
Pain Management Virtual Encounter Note - Virtual Visit via Telephone Telehealth (real-time audio visits between healthcare provider and patient).   Patient's Phone No. & Preferred Pharmacy:  [phone] (home); [phone] (mobile); (Preferred) [phone] [email or web address]  [place] [place] [address] Phone: [phone] Fax: [phone]    Pre-screening note:  Our staff contacted Mr. Mcquillen and offered him an "in person", "face-to-face" appointment versus a telephone encounter. He indicated preferring the telephone encounter, at this time.   Reason for Virtual Visit: COVID-19*  Social distancing based on CDC and AMA recommendations.   I contacted Ramel Rodela Heatley on 05/29/2019 via telephone.      I clearly identified myself as Gaspar Cola, MD. I verified that I was speaking with the correct person using two identifiers (Name: AMRO DISCALA, and date of birth: 11/08/1975).  Advanced Informed Consent I sought verbal advanced consent from [name] for virtual visit interactions. I informed Mr. Linney of possible security and privacy concerns, risks, and limitations associated with providing "not-in-person" medical evaluation and management services. I also informed Mr. Sport of the availability of "in-person" appointments. Finally, I informed him that there would be a charge for the virtual visit and that he could be  personally, fully or partially, financially responsible for it. Mr. Mclain expressed understanding and agreed to proceed.   Historic Elements   Mr. FERDIE SLANINA is a 43 y.o. year old, male patient evaluated today after his last encounter by our practice on 05/24/2019. Mr. Kivett  has a past medical history of Acute lumbar radiculopathy (Right) (S1) (05/26/2016), Acute myofascial pain (05/04/2017), Adrenal insufficiency ([place]), Anxiety, Anxiety disorder (01/28/2015), Arthritis, Back pain,  chronic (06/03/2015), Hypertension, Hypothyroid, Sleep apnea, and Thyroid disease. He also  has a past surgical history that includes Elbow surgery; Back surgery; Colonoscopy with propofol (N/A, 02/23/2019); and discectomy (N/A, 2017, 2018, 2019). Mr. Lannen has a current medication list which includes the following prescription(s): baclofen, carvedilol, fludrocortisone, hydrocortisone, hydrocortisone, hydroxychloroquine, levothyroxine, olmesartan, oxycodone hcl, oxycodone hcl, oxycodone hcl, prednisone, sildenafil, testosterone cypionate, and vitamin d (ergocalciferol). He  reports that he quit smoking about 23 years ago. He quit smokeless tobacco use about 23 years ago. He reports current alcohol use. He reports that he does not use drugs. Mr. Kielar is allergic to borax.   HPI  Today, he is being contacted for both, medication management and a post-procedure assessment.  The patient indicates doing well with the current medication regimen. No adverse reactions or side effects reported to the medications.  Based on the results of the diagnostic bilateral lumbar facet block, the patient's primary low back pain does come from the facet joints and therefore he should be an excellent candidate for radiofrequency ablation.  Today we talked about these results and his alternatives including the fact that he needs to bring his weight down to a BMI of 30.  Since he is 6 feet 2 inches tall, this means that he needs to bring down his weight from his current 300 pounds to 233 pounds.  He indicates that he has not seen a nutritionist, but he is not keen on doing this since he is tired of all of the appointments that he has had to have with other physicians.  Did do physical therapy for his lower back, but this was not successful in controlling his pain and at that time he had to also undergo back surgery.  He  has had a prior lumbar facet radiofrequency ablation, but it was not done bilaterally and I believe that this is  necessary in his case in order to bring down this pain.  In addition to this, today we talked about "drug holidays", since he indicates that he has noticed that his oxycodone is not lasting but approximately 5 hours.  Post-Procedure Evaluation  Procedure (05/23/2019): Diagnostic/therapeutic bilateral lumbar facet block R5/L2 under fluoroscopic guidance and IV sedation Pre-procedure pain level:  5/10 Post-procedure: 6/10 Patient indicates for the first time since he has been having diagnostic lumbar facet blocks, he experienced an increase in the low back pain, which he attributes to the muscle.  In terms of his spinal pain, but this did get better from the very start, with the diagnostic injection.  None of his usual back pain came back until the next day.  Sedation: Please see nurses note.  Effectiveness during initial hour after procedure(Ultra-Short Term Relief): 100 %   Local anesthetic used: Long-acting (4-6 hours) Effectiveness: Defined as any analgesic benefit obtained secondary to the administration of local anesthetics. This carries significant diagnostic value as to the etiological location, or anatomical origin, of the pain. Duration of benefit is expected to coincide with the duration of the local anesthetic used.  Effectiveness during initial 4-6 hours after procedure(Short-Term Relief): 100 %   Long-term benefit: Defined as any relief past the pharmacologic duration of the local anesthetics.  Effectiveness past the initial 6 hours after procedure(Long-Term Relief): 100 % the pain did not return until the next day.  He then describes having experienced an increase in the low back pain, which he attributed to the muscle.  His procedure was done only 6 days ago, but he has already noticed a significant improvement in his low back pain.  He describes that it is nothing like it was before.  However, he still has pain.  Current benefits: Defined as benefit that persist at this time.    Analgesia:  > 75% improvement with the diagnostic procedure. Function: Somewhat improved ROM: Somewhat improved  Note: Based on the result, this patient would be an excellent candidate for radiofrequency ablation of the lumbar facets.   Medical Necessity: Mr. 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 Over-the-counter oral analgesic medications (i.e.: ibuprofen, naproxen, etc.) 2. Anti-inflammatory medications 3. Muscle relaxants 4. Membrane stabilizers 5. Opioids 6. Physical therapy (PT), chiropractic manipulation, and/or home exercise program (HEP). 7. Modalities (Heat, ice, etc.) 8. Invasive techniques such as nerve blocks and/or surgery.  Mr. Gholar has attained greater than 50% reduction in pain from at least two (2) diagnostic medial branch blocks conducted in separate occasions. For this reason, I believe it is medically necessary to proceed with Non-Pulsed Radiofrequency Ablation for the purpose of attempting to prolong the duration of the benefits seen with the diagnostic injections.   Pharmacotherapy Assessment  Analgesic:  Oxycodone IR 10 mg. 1 tab PO q 6 hrs (40 mg/day of oxycodone) MME/day:40 mg/day.   Monitoring: Pharmacotherapy: No side-effects or adverse reactions reported. [place] PMP: PDMP reviewed during this encounter.       Compliance: No problems identified. Effectiveness: Clinically acceptable. Plan: Refer to "POC".  UDS:  Summary  Date Value Ref Range Status  10/20/2018 FINAL  Final    Comment:    ==================================================================== TOXASSURE SELECT 13  (MW) ==================================================================== Test                             Result       Flag       Units Drug Present and Declared for Prescription Verification   Tramadol                       >2513        EXPECTED   ng/mg creat   O-Desmethyltramadol            >2513        EXPECTED   ng/mg creat   N-Desmethyltramadol            1203         EXPECTED   ng/mg creat    Source of tramadol is a prescription medication.    O-desmethyltramadol and N-desmethyltramadol are expected    metabolites of tramadol. ==================================================================== Test                      Result    Flag   Units      Ref Range   Creatinine              199              mg/dL      >=20 ==================================================================== Declared Medications:  The flagging and interpretation on this report are based on the  following declared medications.  Unexpected results may arise from  inaccuracies in the declared medications.  **Note: The testing scope of this panel includes these medications:  Tramadol  **Note: The testing scope of this panel does not include following  reported medications:  Baclofen  Carvedilol  Fludrocortisone  Hydrocortisone  Hydroxychloroquine  Levothyroxine  Methocarbamol  Prednisolone  Sildenafil  Testosterone  Valsartan  Vitamin D2 (Ergocalciferol) ==================================================================== For clinical consultation, please call [phone]. ====================================================================    Laboratory Chemistry Profile (12 mo)  Renal: 06/23/2018: BUN 12; BUN/Creatinine Ratio NOT APPLICABLE [id number]: Creatinine, Ser 0.90  Lab Results  Component Value Date   GFRAA 122 06/23/2018   GFRNONAA 105 06/23/2018   Hepatic: No results found for requested labs within last 8760 hours. Lab Results  Component Value Date   AST 19 06/23/2018   ALT 40  06/23/2018   Other: No results found for requested labs within last 8760 hours. Note: Above Lab results reviewed.  Imaging  Last 90 days:  Mr Lumbar Spine W Wo Contrast  Result Date: 04/19/2019 CLINICAL DATA:  Initial evaluation for chronic central low back pain with extension into the right and left. History of prior surgery. EXAM: MRI LUMBAR SPINE WITHOUT AND WITH CONTRAST TECHNIQUE: Multiplanar and multiecho pulse sequences of the lumbar spine were obtained without and with intravenous contrast. CONTRAST:  10 cc of Gadavist. COMPARISON:  Most recent MRI from 06/10/2018. FINDINGS: Segmentation: Standard. Lowest well-formed disc labeled the L5-S1 level. Alignment: 3 mm retrolisthesis of L5 on S1. Trace retrolisthesis of L4 on L5. Mild straightening of the normal lumbar lordosis. Vertebrae: Vertebral body height maintained without evidence for acute or chronic fracture. Bone marrow signal intensity diffusely decreased on T1 weighted imaging, nonspecific, but most commonly related to anemia, smoking, or obesity. No discrete or worrisome osseous lesions. Mild reactive endplate changes present about the  L5-S1 interspace. No other abnormal marrow edema or enhancement. Conus medullaris and cauda equina: Conus extends to the L1 level. Conus and cauda equina appear normal. Paraspinal and other soft tissues: Normal expected post surgical changes present within the lower posterior paraspinous soft tissues. Paraspinous soft tissues demonstrate no acute finding. Few scattered small simple cyst noted within the kidneys bilaterally. Visualized visceral structures otherwise unremarkable. Disc levels: T11-12: Unremarkable. T12-L1: Unremarkable. L1-2: Unremarkable. L2-3: Negative interspace. Mild bilateral facet hypertrophy. No stenosis or impingement. L3-4: Negative interspace. Mild facet hypertrophy. No stenosis or impingement. L4-5: Mild diffuse disc bulge with disc desiccation. Superimposed broad-based shallow left  foraminal/extraforaminal disc protrusion with associated annular fissure (series 8, image 28), unchanged from previous. Mild bilateral facet hypertrophy. Borderline mild spinal stenosis, stable. Mild left greater than right L4 foraminal narrowing also unchanged. L5-S1: Mild retrolisthesis. Chronic intervertebral disc space narrowing with diffuse disc bulge and disc desiccation. Reactive endplate changes with marginal endplate osteophytic spurring. Postoperative changes from prior right hemi laminectomy with micro discectomy. Enhancing postoperative granulation tissue surrounds the descending right S1 nerve root. No definite significant residual or recurrent disc protrusion seen on today's exam. Central canal remains patent. Severe right with mild left L5 foraminal stenosis, stable. IMPRESSION: 1. Postoperative changes from prior right hemi laminectomy and micro discectomy at L5-S1 with enhancing postoperative granulation tissue surrounding the descending right S1 nerve root. No definite residual or recurrent disc protrusion identified. 2. Persistent severe right neural foraminal stenosis at L5-S1, likely impinging upon the exiting right L5 nerve root. 3. Mild degenerative disc disease at L4-5 as above, stable. Electronically Signed   By: Jeannine Boga M.D.   On: 04/19/2019 01:59   [facility] C-arm 1-60 Min No Report  Result Date: 05/23/2019 Fluoro was used, but no Radiologist interpretation will be provided. Please refer to "NOTES" tab for provider progress note.   Assessment  The primary encounter diagnosis was Chronic pain syndrome. 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  Plan of Care  I have discontinued Maudry Mayhew. Rami's traMADol. I am also having him start on Oxycodone HCl and Oxycodone HCl. Additionally, I am having him maintain his hydrocortisone, testosterone cypionate, fludrocortisone, Vitamin D (Ergocalciferol), hydroxychloroquine, levothyroxine, sildenafil, carvedilol, predniSONE, olmesartan, hydrocortisone, Oxycodone HCl, and baclofen.  Pharmacotherapy (Medications Ordered): Meds ordered this encounter  Medications  . Oxycodone HCl 10 MG TABS    Sig: Take 1 tablet (10 mg total) by mouth every 6 (six) hours as needed. Must last 30 days.    Dispense:  120 tablet    Refill:  0    Chronic Pain: STOP Act (Not applicable) Fill 1 day early if closed on refill date. Do not fill until: 05/31/2019. To last until: 06/30/2019. Avoid benzodiazepines within 8 hours of opioids  . Oxycodone HCl 10 MG TABS    Sig: Take 1 tablet (10 mg total) by mouth every 6 (six) hours as needed. Must last 30 days.    Dispense:  120 tablet    Refill:  0    Chronic Pain: STOP Act (Not applicable) Fill 1 day early if closed on refill date. Do not fill until: 06/30/2019. To last until: 07/30/2019. Avoid benzodiazepines within 8 hours of opioids  . Oxycodone HCl 10 MG TABS  Sig: Take 1 tablet (10 mg total) by mouth every 6 (six) hours as needed. Must last 30 days.    Dispense:  120 tablet    Refill:  0    Chronic Pain: STOP Act (Not applicable) Fill 1 day early if closed on refill date. Do not fill until: 07/30/2019. To last until: 08/29/2019. Avoid benzodiazepines within 8 hours of opioids  . baclofen (LIORESAL) 10 MG tablet    Sig: Take 1 tablet (10 mg total) by mouth 4 (four) times daily.    Dispense:  120 tablet    Refill:  2    Fill one day early if pharmacy is closed on scheduled refill date. May substitute for generic if available.   Orders:  Orders Placed This Encounter  Procedures  . Radiofrequency,Lumbar    Standing Status:   Future    Standing Expiration  Date:   11/26/2020    Scheduling Instructions:     Side(s): Bilateral, starting with the right side.     Level: L3-4, L4-5, & L5-S1 Facets (L2, L3, L4, L5, & S1 Medial Branch Nerves)     Sedation: With Sedation     Scheduling Timeframe: As soon as pre-approved    Order Specific Question:   Where will this procedure be performed?    Answer:   [facility]   Follow-up plan:   Return in about 13 weeks (around 08/28/2019) for (VV), (MM), in addition, RFA (w/ sedation): (R) L-FCT RFA #2, (ASAP).      Interventional options: Considering:  NOTE: PLAQUENIL ANTICOAGULATION  Possible left lumbar facet RFA #1  Diagnostic caudal ESI + diagnostic epidurogram  Possible Racz procedure    Palliative PRN treatment(s):  Palliative Caudal ESI  Palliative right L5-S1 LESI  Palliative rightlumbar facet block #6  Diagnostic left lumbar facet block #3  Palliative right lumbar facet RFA #2  Palliative right SI joint block #2  Palliative right IA hip joint injection Palliative Lumbar TPI/MNB #2     Recent Visits Date Type Provider Dept  05/23/19 Procedure visit Milinda Pointer, MD [facility]  05/01/19 Office Visit Milinda Pointer, MD [facility]  Showing recent visits within past 90 days and meeting all other requirements   Today's Visits Date Type Provider Dept  05/29/19 Office Visit Milinda Pointer, MD [facility]  Showing today's visits and meeting all other requirements   Future Appointments Date Type Provider Dept  06/12/19 Appointment Milinda Pointer, MD [facility]  Showing future appointments within next 90 days and meeting all other requirements   I discussed the assessment and treatment plan with the patient. The patient was provided an opportunity to ask questions and all were answered. The patient agreed with the plan and demonstrated an understanding of the instructions.  Patient advised to call back or seek an  in-person evaluation if the symptoms or condition worsens.  Total duration of non-face-to-face encounter: 20 minutes.  Note by: Gaspar Cola, MD Date: 05/29/2019; Time: 9:11 AM  Note: This dictation was prepared with Dragon dictation. Any transcriptional errors that may result from this process are unintentional.  Disclaimer:  * Given the special circumstances of the COVID-19 pandemic, the federal government has announced that the Office for Civil Rights (OCR) will exercise its enforcement discretion and will not impose penalties on physicians using telehealth in the event of noncompliance with regulatory requirements under the [place] (HIPAA) in connection with the good faith provision of telehealth during the  XX123456 [employer]. ([place])

## 2019-05-29 ENCOUNTER — Ambulatory Visit: Payer: No Typology Code available for payment source | Attending: Pain Medicine | Admitting: Pain Medicine

## 2019-05-29 ENCOUNTER — Other Ambulatory Visit: Payer: Self-pay

## 2019-05-29 DIAGNOSIS — M7918 Myalgia, other site: Secondary | ICD-10-CM

## 2019-05-29 DIAGNOSIS — M79604 Pain in right leg: Secondary | ICD-10-CM | POA: Diagnosis not present

## 2019-05-29 DIAGNOSIS — G894 Chronic pain syndrome: Secondary | ICD-10-CM | POA: Diagnosis not present

## 2019-05-29 DIAGNOSIS — M5441 Lumbago with sciatica, right side: Secondary | ICD-10-CM | POA: Diagnosis not present

## 2019-05-29 DIAGNOSIS — M961 Postlaminectomy syndrome, not elsewhere classified: Secondary | ICD-10-CM | POA: Diagnosis not present

## 2019-05-29 DIAGNOSIS — G8929 Other chronic pain: Secondary | ICD-10-CM

## 2019-05-29 DIAGNOSIS — M47816 Spondylosis without myelopathy or radiculopathy, lumbar region: Secondary | ICD-10-CM

## 2019-05-29 DIAGNOSIS — M79605 Pain in left leg: Secondary | ICD-10-CM

## 2019-05-29 MED ORDER — OXYCODONE HCL 10 MG PO TABS: 10.0000 mg | ORAL_TABLET | Freq: Four times a day (QID) | ORAL | 0 refills | Status: DC | PRN

## 2019-05-29 MED ORDER — BACLOFEN 10 MG PO TABS: 10.0000 mg | ORAL_TABLET | Freq: Four times a day (QID) | ORAL | 2 refills | Status: DC

## 2019-05-29 NOTE — Patient Instructions (Signed)

## 2019-06-12 ENCOUNTER — Ambulatory Visit: Payer: No Typology Code available for payment source | Admitting: Pain Medicine

## 2019-06-22 ENCOUNTER — Encounter: Payer: Self-pay | Admitting: Pain Medicine

## 2019-06-22 ENCOUNTER — Ambulatory Visit (HOSPITAL_BASED_OUTPATIENT_CLINIC_OR_DEPARTMENT_OTHER): Payer: No Typology Code available for payment source | Admitting: Pain Medicine

## 2019-06-22 ENCOUNTER — Ambulatory Visit
Admission: RE | Admit: 2019-06-22 | Discharge: 2019-06-22 | Disposition: A | Discharge status: Home / Self Care | Payer: No Typology Code available for payment source | Source: Ambulatory Visit | Attending: Pain Medicine | Admitting: Pain Medicine

## 2019-06-22 ENCOUNTER — Other Ambulatory Visit: Payer: Self-pay

## 2019-06-22 VITALS — BP 116/69 | HR 66 | Temp 97.7°F | Resp 20 | Ht 74.0 in | Wt 300.0 lb

## 2019-06-22 DIAGNOSIS — G894 Chronic pain syndrome: Secondary | ICD-10-CM | POA: Diagnosis present

## 2019-06-22 DIAGNOSIS — G8918 Other acute postprocedural pain: Secondary | ICD-10-CM | POA: Diagnosis present

## 2019-06-22 DIAGNOSIS — M51379 Other intervertebral disc degeneration, lumbosacral region without mention of lumbar back pain or lower extremity pain: Secondary | ICD-10-CM | POA: Insufficient documentation

## 2019-06-22 DIAGNOSIS — M5441 Lumbago with sciatica, right side: Secondary | ICD-10-CM | POA: Insufficient documentation

## 2019-06-22 DIAGNOSIS — M47816 Spondylosis without myelopathy or radiculopathy, lumbar region: Secondary | ICD-10-CM | POA: Diagnosis not present

## 2019-06-22 DIAGNOSIS — M47817 Spondylosis without myelopathy or radiculopathy, lumbosacral region: Secondary | ICD-10-CM | POA: Insufficient documentation

## 2019-06-22 DIAGNOSIS — M5137 Other intervertebral disc degeneration, lumbosacral region: Secondary | ICD-10-CM | POA: Insufficient documentation

## 2019-06-22 DIAGNOSIS — G8929 Other chronic pain: Secondary | ICD-10-CM | POA: Insufficient documentation

## 2019-06-22 HISTORY — DX: Other acute postprocedural pain: G89.18

## 2019-06-22 MED ORDER — TRIAMCINOLONE ACETONIDE 40 MG/ML IJ SUSP
40.0000 mg | Freq: Once | INTRAMUSCULAR | Status: AC
  Administered 2019-06-22: 10:00:00 40 mg
  Filled 2019-06-22: qty 1

## 2019-06-22 MED ORDER — LACTATED RINGERS IV SOLN
1000.0000 mL | Freq: Once | INTRAVENOUS | Status: AC
  Administered 2019-06-22: 1000 mL via INTRAVENOUS

## 2019-06-22 MED ORDER — LIDOCAINE HCL 2 % IJ SOLN
20.0000 mL | Freq: Once | INTRAMUSCULAR | Status: AC
  Administered 2019-06-22: 400 mg
  Filled 2019-06-22: qty 20

## 2019-06-22 MED ORDER — HYDROCODONE-ACETAMINOPHEN 5-325 MG PO TABS: 1.0000 | ORAL_TABLET | Freq: Four times a day (QID) | ORAL | 0 refills | Status: DC | PRN

## 2019-06-22 MED ORDER — OXYCODONE HCL 10 MG PO TABS: 10.0000 mg | ORAL_TABLET | Freq: Four times a day (QID) | ORAL | 0 refills | Status: DC | PRN

## 2019-06-22 MED ORDER — MIDAZOLAM HCL 5 MG/5ML IJ SOLN
1.0000 mg | INTRAMUSCULAR | Status: DC | PRN
  Administered 2019-06-22: 2 mg via INTRAVENOUS
  Filled 2019-06-22: qty 5

## 2019-06-22 MED ORDER — ROPIVACAINE HCL 2 MG/ML IJ SOLN
9.0000 mL | Freq: Once | INTRAMUSCULAR | Status: AC
  Administered 2019-06-22: 10:00:00 9 mL via PERINEURAL
  Filled 2019-06-22: qty 10

## 2019-06-22 MED ORDER — FENTANYL CITRATE (PF) 100 MCG/2ML IJ SOLN
25.0000 ug | INTRAMUSCULAR | Status: DC | PRN
  Administered 2019-06-22: 25 ug via INTRAVENOUS
  Filled 2019-06-22: qty 2

## 2019-06-22 NOTE — Progress Notes (Signed)
Patient's Name: Dean Ford  MRN: [id number]  Referring Provider: Milinda Pointer, MD  DOB: 29-Sep-1975  PCP: Hubbard Hartshorn, FNP  DOS: 06/22/2019  Note by: Gaspar Cola, MD  Service setting: Ambulatory outpatient  Specialty: Interventional Pain Management  Patient type: Established  Location: [facility] (AMB) Pain Management Facility  Visit type: Interventional Procedure   Primary Reason for Visit: Interventional Pain Management Treatment. CC: Back Pain (right lower)  Procedure:          Anesthesia, Analgesia, Anxiolysis:  Type: Lumbar Facet, Medial Branch Block(s)          Primary Purpose: Diagnostic Region: Posterolateral Lumbosacral Spine Level: L2, L3, L4, L5, & S1 Medial Branch Level(s). Injecting these levels blocks the L3-4, L4-5, and L5-S1 lumbar facet joints. Laterality: Right   NOTE: Unsuccessful attempt at RFA  Type: Moderate (Conscious) Sedation combined with Local Anesthesia Indication(s): Analgesia and Anxiety Route: Intravenous (IV) IV Access: Secured Sedation: Meaningful verbal contact was maintained at all times during the procedure  Local Anesthetic: Lidocaine 1-2%  Position: Prone   Indications: 1. Lumbar facet syndrome (Bilateral) (R>L)   2. Spondylosis without myelopathy or radiculopathy, lumbosacral region   3. Lumbar facet hypertrophy (Multilevel)   4. DDD (degenerative disc disease), lumbosacral   5. Chronic low back pain (Primary Area of Pain) (Bilateral) (R>L)   6. Lumbar spondylosis    Pain Score: Pre-procedure: 4 /10 Post-procedure: 0-No pain/10   Pre-op Assessment:  Dean Ford is a 43 y.o. (year old), male patient, seen today for interventional treatment. He  has a past surgical history that includes Elbow surgery; Back surgery; Colonoscopy with propofol (N/A, 02/23/2019); and discectomy (N/A, 2017, 2018, 2019). Dean Ford has a current medication list which includes the following prescription(s): baclofen, carvedilol, fludrocortisone,  hydrocodone-acetaminophen, hydrocodone-acetaminophen, hydrocortisone, hydrocortisone, hydroxychloroquine, levothyroxine, olmesartan, oxycodone hcl, oxycodone hcl, oxycodone hcl, prednisone, sildenafil, testosterone cypionate, and vitamin d (ergocalciferol), and the following Facility-Administered Medications: fentanyl and midazolam. His primarily concern today is the Back Pain (right lower)  Initial Vital Signs:  Pulse/HCG Rate: 74ECG Heart Rate: 69 Temp: (!) 97.2 F (36.2 C) Resp: 18 BP: 127/80 SpO2: 98 %  BMI: Estimated body mass index is 38.52 kg/m as calculated from the following:   Height as of this encounter: 6\' 2"  (1.88 m).   Weight as of this encounter: 300 lb (136.1 kg).  Risk Assessment: Allergies: Reviewed. He is allergic to borax.  Allergy Precautions: None required Coagulopathies: Reviewed. None identified.  Blood-thinner therapy: None at this time Active Infection(s): Reviewed. None identified. Dean Ford is afebrile  Site Confirmation: Dean Ford was asked to confirm the procedure and laterality before marking the site Procedure checklist: Completed Consent: Before the procedure and under the influence of no sedative(s), amnesic(s), or anxiolytics, the patient was informed of the treatment options, risks and possible complications. To fulfill our ethical and legal obligations, as recommended by the American Medical Association's Code of Ethics, I have informed the patient of my clinical impression; the nature and purpose of the treatment or procedure; the risks, benefits, and possible complications of the intervention; the alternatives, including doing nothing; the risk(s) and benefit(s) of the alternative treatment(s) or procedure(s); and the risk(s) and benefit(s) of doing nothing. The patient was provided information about the general risks and possible complications associated with the procedure. These may include, but are not limited to: failure to achieve desired goals,  infection, bleeding, organ or nerve damage, allergic reactions, paralysis, and death. In addition, the patient was informed of those  risks and complications associated to Spine-related procedures, such as failure to decrease pain; infection (i.e.: Meningitis, epidural or intraspinal abscess); bleeding (i.e.: epidural hematoma, subarachnoid hemorrhage, or any other type of intraspinal or peri-dural bleeding); organ or nerve damage (i.e.: Any type of peripheral nerve, nerve root, or spinal cord injury) with subsequent damage to sensory, motor, and/or autonomic systems, resulting in permanent pain, numbness, and/or weakness of one or several areas of the body; allergic reactions; (i.e.: anaphylactic reaction); and/or death. Furthermore, the patient was informed of those risks and complications associated with the medications. These include, but are not limited to: allergic reactions (i.e.: anaphylactic or anaphylactoid reaction(s)); adrenal axis suppression; blood sugar elevation that in diabetics may result in ketoacidosis or comma; water retention that in patients with history of congestive heart failure may result in shortness of breath, pulmonary edema, and decompensation with resultant heart failure; weight gain; swelling or edema; medication-induced neural toxicity; particulate matter embolism and blood vessel occlusion with resultant organ, and/or nervous system infarction; and/or aseptic necrosis of one or more joints. Finally, the patient was informed that Medicine is not an exact science; therefore, there is also the possibility of unforeseen or unpredictable risks and/or possible complications that may result in a catastrophic outcome. The patient indicated having understood very clearly. We have given the patient no guarantees and we have made no promises. Enough time was given to the patient to ask questions, all of which were answered to the patient's satisfaction. Dean Ford has indicated that he wanted  to continue with the procedure. Attestation: I, the ordering provider, attest that I have discussed with the patient the benefits, risks, side-effects, alternatives, likelihood of achieving goals, and potential problems during recovery for the procedure that I have provided informed consent. Date   Time: 06/22/2019  9:11 AM  Pre-Procedure Preparation:  Monitoring: As per clinic protocol. Respiration, ETCO2, SpO2, BP, heart rate and rhythm monitor placed and checked for adequate function Safety Precautions: Patient was assessed for positional comfort and pressure points before starting the procedure. Time-out: I initiated and conducted the "Time-out" before starting the procedure, as per protocol. The patient was asked to participate by confirming the accuracy of the "Time Out" information. Verification of the correct person, site, and procedure were performed and confirmed by me, the nursing staff, and the patient. "Time-out" conducted as per Joint Commission's Universal Protocol (UP.01.01.01). Time: 0953  Description of Procedure:          Laterality: Right Levels:  L2, L3, L4, L5, & S1 Medial Branch Level(s) Area Prepped: Posterior Lumbosacral Region Prepping solution: DuraPrep (Iodine Povacrylex [0.7% available iodine] and Isopropyl Alcohol, 74% w/w) Safety Precautions: Aspiration looking for blood return was conducted prior to all injections. At no point did we inject any substances, as a needle was being advanced. Before injecting, the patient was told to immediately notify me if he was experiencing any new onset of "ringing in the ears, or metallic taste in the mouth". No attempts were made at seeking any paresthesias. Safe injection practices and needle disposal techniques used. Medications properly checked for expiration dates. SDV (single dose vial) medications used. After the completion of the procedure, all disposable equipment used was discarded in the proper designated medical waste  containers. Local Anesthesia: Protocol guidelines were followed. The patient was positioned over the fluoroscopy table. The area was prepped in the usual manner. The time-out was completed. The target area was identified using fluoroscopy. A 12-in long, straight, sterile hemostat was used with fluoroscopic guidance to  locate the targets for each level blocked. Once located, the skin was marked with an approved surgical skin marker. Once all sites were marked, the skin (epidermis, dermis, and hypodermis), as well as deeper tissues (fat, connective tissue and muscle) were infiltrated with a small amount of a short-acting local anesthetic, loaded on a 10cc syringe with a 25G, 1.5-in  Needle. An appropriate amount of time was allowed for local anesthetics to take effect before proceeding to the next step. Local Anesthetic: Lidocaine 2.0% The unused portion of the local anesthetic was discarded in the proper designated containers. Technical explanation of process:  L2 Medial Branch Nerve Block (MBB): The target area for the L2 medial branch is at the junction of the postero-lateral aspect of the superior articular process and the superior, posterior, and medial edge of the transverse process of L3. Under fluoroscopic guidance, a radiofrequency needle was inserted until contact was made with os over the superior postero-lateral aspect of the pedicular shadow (target area).  Unsuccessful attempt at obtaining adequate stimulation during sensory and motor testing.  After negative aspiration for blood, 2.0 mL of the nerve block solution was injected without difficulty or complication. The needle was removed intact. L3 Medial Branch Nerve Block (MBB): The target area for the L3 medial branch is at the junction of the postero-lateral aspect of the superior articular process and the superior, posterior, and medial edge of the transverse process of L4. Under fluoroscopic guidance, a radiofrequency needle was inserted until  contact was made with os over the superior postero-lateral aspect of the pedicular shadow (target area). Unsuccessful attempt at obtaining adequate stimulation during sensory and motor testing. After negative aspiration for blood, 2.0 mL of the nerve block solution was injected without difficulty or complication. The needle was removed intact. L4 Medial Branch Nerve Block (MBB): The target area for the L4 medial branch is at the junction of the postero-lateral aspect of the superior articular process and the superior, posterior, and medial edge of the transverse process of L5. Under fluoroscopic guidance, a radiofrequency needle was inserted until contact was made with os over the superior postero-lateral aspect of the pedicular shadow (target area). Unsuccessful attempt at obtaining adequate stimulation during sensory and motor testing. After negative aspiration for blood, 2.0 mL of the nerve block solution was injected without difficulty or complication. The needle was removed intact. L5 Medial Branch Nerve Block (MBB): The target area for the L5 medial branch is at the junction of the postero-lateral aspect of the superior articular process and the superior, posterior, and medial edge of the sacral ala. Under fluoroscopic guidance, a radiofrequency needle was inserted until contact was made with os over the superior postero-lateral aspect of the pedicular shadow (target area). Unsuccessful attempt at obtaining adequate stimulation during sensory and motor testing. After negative aspiration for blood, 2.0 mL of the nerve block solution was injected without difficulty or complication. The needle was removed intact. S1 Medial Branch Nerve Block (MBB): The target area for the S1 medial branch is at the posterior and inferior 6 o'clock position of the L5-S1 facet joint. Under fluoroscopic guidance, the radiofrequency needle inserted for the L5 MBB was redirected until contact was made with os over the inferior and  postero aspect of the sacrum, at the 6 o' clock position under the L5-S1 facet joint (Target area). Unsuccessful attempt at obtaining adequate stimulation during sensory and motor testing. After negative aspiration for blood, 2.0 mL of the nerve block solution was injected without difficulty or complication.  The needle was removed intact.  Note: After multiple unsuccessful separate attempts at getting sensory and motor stimulation during the testing phase of the radiofrequency, the decision was made not to proceed with any further manipulation and the patient was informed that we would no longer proceed with the radiofrequency ablation and instead we would simply inject the local anesthetic and steroid for the purpose of relieving some of the pain.  The decision was made not to proceed with radiofrequency since I felt that the placement of the needle was not satisfactory in order to achieve an adequate radiofrequency ablation.  To avoid further discomfort to the patient, further attempts were aborted.  Nerve block solution: 0.2% PF-Ropivacaine + Triamcinolone (40 mg/mL) diluted to a final concentration of 4 mg of Triamcinolone/mL of Ropivacaine The unused portion of the solution was discarded in the proper designated containers. Procedural Needles: 22-gauge, 3.5-inch, Quincke needles used for all levels.  Once the entire procedure was completed, the treated area was cleaned, making sure to leave some of the prepping solution back to take advantage of its long term bactericidal properties.   Illustration of the posterior view of the lumbar spine and the posterior neural structures. Laminae of L2 through S1 are labeled. DPRL5, dorsal primary ramus of L5; DPRS1, dorsal primary ramus of S1; DPR3, dorsal primary ramus of L3; FJ, facet (zygapophyseal) joint L3-L4; I, inferior articular process of L4; LB1, lateral branch of dorsal primary ramus of L1; IAB, inferior articular branches from L3 medial branch  (supplies L4-L5 facet joint); IBP, intermediate branch plexus; MB3, medial branch of dorsal primary ramus of L3; NR3, third lumbar nerve root; S, superior articular process of L5; SAB, superior articular branches from L4 (supplies L4-5 facet joint also); TP3, transverse process of L3.  Vitals:   06/22/19 1100 06/22/19 1105 06/22/19 1115 06/22/19 1125  BP: (!) 138/102 121/66 121/69 116/69  Pulse: 66     Resp: 15 16 16 20   Temp:  (!) 97.1 F (36.2 C)  97.7 F (36.5 C)  TempSrc:  Temporal  Temporal  SpO2: 98% 96% 97% 96%  Weight:      Height:         Start Time: 0953 hrs. End Time: 1057 hrs.  Imaging Guidance (Spinal):          Type of Imaging Technique: Fluoroscopy Guidance (Spinal) Indication(s): Assistance in needle guidance and placement for procedures requiring needle placement in or near specific anatomical locations not easily accessible without such assistance. Exposure Time: Please see nurses notes. Contrast: None used. Fluoroscopic Guidance: I was personally present during the use of fluoroscopy. "Tunnel Vision Technique" used to obtain the best possible view of the target area. Parallax error corrected before commencing the procedure. "Direction-depth-direction" technique used to introduce the needle under continuous pulsed fluoroscopy. Once target was reached, antero-posterior, oblique, and lateral fluoroscopic projection used confirm needle placement in all planes. Images permanently stored in EMR. Interpretation: No contrast injected. I personally interpreted the imaging intraoperatively. Adequate needle placement confirmed in multiple planes. Permanent images saved into the patient's record.  Antibiotic Prophylaxis:   Anti-infectives (From admission, onward)   None     Indication(s): None identified  Post-operative Assessment:  Post-procedure Vital Signs:  Pulse/HCG Rate: 6679 Temp: 97.7 F (36.5 C) Resp: 20 BP: 116/69 SpO2: 96 %  EBL: None  Complications: No  immediate post-treatment complications observed by team, or reported by patient.  Note: The patient tolerated the entire procedure well. A repeat set of vitals were taken  after the procedure and the patient was kept under observation following institutional policy, for this type of procedure. Post-procedural neurological assessment was performed, showing return to baseline, prior to discharge. The patient was provided with post-procedure discharge instructions, including a section on how to identify potential problems. Should any problems arise concerning this procedure, the patient was given instructions to immediately contact us, at any time, without hesitation. In any case, we plan to contact the patient by telephone for a follow-up status report regarding this interventional procedure.  Comments:  No additional relevant information.  Plan of Care  Orders:  Orders Placed This Encounter  Procedures   RFA - Lumbar Facet (Today)    Scheduling Instructions:     Side(s): Right-sided     Level: L3-4, L4-5, & L5-S1 Facets (L2, L3, L4, L5, & S1 Medial Branch Nerves)     Sedation: With Sedation     Timeframe: Today    Order Specific Question:   Where will this procedure be performed?    Answer:   [facility]   RFA - Lumbar Facet (Schedule)    Standing Status:   Future    Standing Expiration Date:   12/19/2020    Scheduling Instructions:     Side(s): Left-sided     Level: L3-4, L4-5, & L5-S1 Facets (L2, L3, L4, L5, & S1 Medial Branch Nerves)     Sedation: With Sedation     Scheduling Timeframe: 2 weeks from now    Order Specific Question:   Where will this procedure be performed?    Answer:   [facility]   Fluoro (C-Arm) (<60 min) (No Report)    Intraoperative interpretation by procedural physician at [facility].    Standing Status:   Standing    Number of Occurrences:   1    Order Specific Question:   Reason for exam:    Answer:   Assistance in needle  guidance and placement for procedures requiring needle placement in or near specific anatomical locations not easily accessible without such assistance.   Consent: L-FCT (RFA)    Nursing Order: Transcribe to consent form and obtain patient signature. Note: Always confirm laterality of pain with Dean Ford, before procedure. Procedure: Lumbar Facet Radiofrequency Ablation Indication/Reason: Low Back Pain, with our without leg pain, due to Facet Joint Arthralgia (Joint Pain) known as Lumbar Facet Syndrome, secondary to Lumbar, and/or Lumbosacral Spondylosis (Arthritis of the Spine), without myelopathy or radiculopathy (Nerve Damage). Provider Attestation: I, Magnolia Dossie Arbour, MD, (Pain Management Specialist), the physician/practitioner, attest that I have discussed with the patient the benefits, risks, side effects, alternatives, likelihood of achieving goals and potential problems during recovery for the procedure that I have provided informed consent.   Radiofrequency Tray    Equipment required: Sterile "Radiofrequency Tray"; Large hemostat (1); Small hemostat (1); Towels (6-8); 4x4 sterile sponge pack (1) Radiofrequency Needle(s): Size: Long Quantity: 5    Standing Status:   Standing    Number of Occurrences:   1    Order Specific Question:   Specify    Answer:   Radiofrequency Tray   Chronic Opioid Analgesic:  Oxycodone IR 10 mg. 1 tab PO q 6 hrs (40 mg/day of oxycodone) MME/day:40 mg/day.   Medications ordered for procedure: Meds ordered this encounter  Medications   lidocaine (XYLOCAINE) 2 % (with pres) injection 400 mg   lactated ringers infusion 1,000 mL   midazolam (VERSED) 5 MG/5ML injection 1-2 mg    Make sure Flumazenil  is available in the pyxis when using this medication. If oversedation occurs, administer 0.2 mg IV over 15 sec. If after 45 sec no response, administer 0.2 mg again over 1 min; may repeat at 1 min intervals; not to exceed 4 doses (1 mg)   fentaNYL  (SUBLIMAZE) injection 25 mcg    Make sure Narcan is available in the pyxis when using this medication. In the event of respiratory depression (RR< 8/min): Titrate NARCAN (naloxone) in increments of 0.1 to 0.2 mg IV at 2-3 minute intervals, until desired degree of reversal.   ropivacaine (PF) 2 mg/mL (0.2%) (NAROPIN) injection 9 mL   triamcinolone acetonide (KENALOG-40) injection 40 mg   Oxycodone HCl 10 MG TABS    Sig: Take 1 tablet (10 mg total) by mouth every 6 (six) hours as needed. Must last 30 days.    Dispense:  120 tablet    Refill:  0    Chronic Pain: STOP Act (Not applicable) Fill 1 day early if closed on refill date. Do not fill until: 08/29/2019. To last until: 09/28/2019. Avoid benzodiazepines within 8 hours of opioids   HYDROcodone-acetaminophen (NORCO/VICODIN) 5-325 MG tablet    Sig: Take 1 tablet by mouth every 6 (six) hours as needed for up to 7 days for severe pain. Must last 7 days.    Dispense:  28 tablet    Refill:  0    For acute post-operative pain. Not to be refilled. Must last 7 days.   HYDROcodone-acetaminophen (NORCO/VICODIN) 5-325 MG tablet    Sig: Take 1 tablet by mouth every 6 (six) hours as needed for up to 7 days for severe pain. Must last 7 days.    Dispense:  28 tablet    Refill:  0    For acute post-operative pain. Not to be refilled.  Must last 7 days.   Medications administered: We administered lidocaine, lactated ringers, midazolam, fentaNYL, ropivacaine (PF) 2 mg/mL (0.2%), and triamcinolone acetonide.  See the medical record for exact dosing, route, and time of administration.  Follow-up plan:   Return in about 2 weeks (around 07/06/2019) for f/up PP evaluation.       Interventional options: Considering:  NOTE: PLAQUENIL ANTICOAGULATION  Possible left lumbar facet RFA #1  Diagnostic caudal ESI + diagnostic epidurogram  Possible Racz procedure    Palliative PRN treatment(s):  Palliative Caudal ESI  Palliative right L5-S1 LESI    Palliative rightlumbar facet block #6  Diagnostic left lumbar facet block #3  Palliative right lumbar facet RFA #2  Palliative right SI joint block #2  Palliative right IA hip joint injection Palliative Lumbar TPI/MNB #2      Recent Visits Date Type Provider Dept  05/29/19 Office Visit Milinda Pointer, MD [facility]  05/23/19 Procedure visit Milinda Pointer, MD [facility]  05/01/19 Office Visit Milinda Pointer, MD [facility]  Showing recent visits within past 90 days and meeting all other requirements   Today's Visits Date Type Provider Dept  06/22/19 Procedure visit Milinda Pointer, MD [facility]  Showing today's visits and meeting all other requirements   Future Appointments Date Type Provider Dept  07/03/19 Appointment Milinda Pointer, MD [facility]  08/28/19 Appointment Milinda Pointer, MD [facility]  Showing future appointments within next 90 days and meeting all other requirements   Disposition: Discharge home  Discharge Date & Time: 06/22/2019; 1126 hrs.   Primary Care Physician: Hubbard Hartshorn, FNP Location: [facility] Note by:  Gaspar Cola, MD Date: 06/22/2019; Time: 12:12 PM  Disclaimer:  Medicine is not an Chief Strategy Officer. The only guarantee in medicine is that nothing is guaranteed. It is important to note that the decision to proceed with this intervention was based on the information collected from the patient. The Data and conclusions were drawn from the patient's questionnaire, the interview, and the physical examination. Because the information was provided in large part by the patient, it cannot be guaranteed that it has not been purposely or unconsciously manipulated. Every effort has been made to obtain as much relevant data as possible for this evaluation. It is important to note that the conclusions that lead to this procedure are derived in  large part from the available data. Always take into account that the treatment will also be dependent on availability of resources and existing treatment guidelines, considered by other Pain Management Practitioners as being common knowledge and practice, at the time of the intervention. For Medico-Legal purposes, it is also important to point out that variation in procedural techniques and pharmacological choices are the acceptable norm. The indications, contraindications, technique, and results of the above procedure should only be interpreted and judged by a Board-Certified Interventional Pain Specialist with extensive familiarity and expertise in the same exact procedure and technique.

## 2019-06-22 NOTE — Patient Instructions (Signed)

## 2019-06-22 NOTE — Progress Notes (Signed)
Safety precautions to be maintained throughout the outpatient stay will include: orient to surroundings, keep bed in low position, maintain call bell within reach at all times, provide assistance with transfer out of bed and ambulation.  

## 2019-06-23 ENCOUNTER — Telehealth: Payer: Self-pay

## 2019-06-23 NOTE — Telephone Encounter (Signed)
Post procedure phone call. Patient states he is doing fine, just sore.

## 2019-06-30 ENCOUNTER — Encounter: Payer: Self-pay | Admitting: Pain Medicine

## 2019-06-30 NOTE — Progress Notes (Signed)
Pain relief after procedure (treated area only): (Questions asked to patient) 1. Starting about 15 minutes after the procedure, and "while the area was still numb" (from the local anesthetics), were you having any of your usual pain "in that area"? (NOT including the discomfort from the needle sticks.) First 1 hour: 0 % better. First 4-6 hours 0 % better. 2. Assuming that it did get numb. How long was the area numb? 0 % benefit, longer than 6 hours. How long? Couple of days 3. How much better is your pain now, when compared to before the procedure? Current benefit: 70 % better. 4. Can you move better now? Improvement in ROM (Range of Motion): Yes. 5. Can you do more now? Improvement in function: Yes. 4. Did you have any problems with the procedure? Side-effects/Complications: No.

## 2019-07-02 NOTE — Progress Notes (Signed)
Pain Management Virtual Encounter Note - Virtual Visit via Telephone Telehealth (real-time audio visits between healthcare provider and patient).   Patient's Phone No. & Preferred Pharmacy:  [phone] (home); [phone] (mobile); (Preferred) [phone] [email or web address]  [place] [place] [address] Phone: [phone] Fax: [phone]    Pre-screening note:  Our staff contacted Dean Ford and offered him an "in person", "face-to-face" appointment versus a telephone encounter. He indicated preferring the telephone encounter, at this time.   Reason for Virtual Visit: COVID-19*  Social distancing based on CDC and AMA recommendations.   I contacted Dean Ford on 07/03/2019 via telephone.      I clearly identified myself as Gaspar Cola, MD. I verified that I was speaking with the correct person using two identifiers (Name: Dean Ford, and date of birth: 08/27/1975).  Advanced Informed Consent I sought verbal advanced consent from Dean Ford for virtual visit interactions. I informed Dean Ford of possible security and privacy concerns, risks, and limitations associated with providing "not-in-person" medical evaluation and management services. I also informed Dean Ford of the availability of "in-person" appointments. Finally, I informed him that there would be a charge for the virtual visit and that he could be  personally, fully or partially, financially responsible for it. Dean Ford expressed understanding and agreed to proceed.   Historic Elements   Dean Ford is a 43 y.o. year old, male patient evaluated today after his last encounter by our practice on 06/23/2019. Dean Ford  has a past medical history of Acute lumbar radiculopathy (Right) (S1) (05/26/2016), Acute myofascial pain (05/04/2017), Acute postoperative pain (06/22/2019), Adrenal insufficiency ([place]), Anxiety, Anxiety disorder  (01/28/2015), Arthritis, Back pain, chronic (06/03/2015), Hypertension, Hypothyroid, Sleep apnea, and Thyroid disease. He also  has a past surgical history that includes Elbow surgery; Back surgery; Colonoscopy with propofol (N/A, 02/23/2019); and discectomy (N/A, 2017, 2018, 2019). Dean Ford has a current medication list which includes the following prescription(s): baclofen, carvedilol, fludrocortisone, hydrocortisone, hydrocortisone, hydroxychloroquine, levothyroxine, olmesartan, oxycodone hcl, oxycodone hcl, oxycodone hcl, prednisone, sildenafil, testosterone cypionate, vitamin d (ergocalciferol), gabapentin, and gabapentin. He  reports that he quit smoking about 23 years ago. He quit smokeless tobacco use about 23 years ago. He reports current alcohol use. He reports that he does not use drugs. Dean Ford is allergic to borax.   HPI  Today, he is being contacted for both, medication management and a post-procedure assessment.  The patient returns to the clinic today after a failed attempt at RFA of the lumbar facets.  He is only the second of 2 patients that I have ever encountered in my career were I was unable to elicit any type of ID stimulation of the medial branches, despite multiple attempts.  Because I was unable to elicit that ID stimulation, the RFA procedure was aborted and instead a regular palliative lumbar facet block was performed.  I am not 100% sure that this was not an issue related to feedback from the patient or if it was truly a physiological fluke.  It would not be the first time that despite her fully explaining to the patient what we were looking for, we would end up with unreliable reporting.  An example is the fact that on the day of this procedure, before the patient left the clinic, we asked him what his discharge pain score was and he reported 100% relief of the pain with a  pain score of 0/10 upon leaving the facility.  However, upon follow-up, he reports 0% relief for the initial 6  hours after the procedure, which would not only contradict the previously recorded information, but it would also make no sense from the expected pharmacological standpoint.  Regardless of the reason, it is clear to me that further attempts at providing him with radiofrequency ablation would be useless.  Furthermore, the results of this failed treatment and conversion into a simple or facet block would suggest that he get better relief by simply doing a palliative procedure.  Today we talked about several options including the possibility of an implant.  I do agree with the neurosurgeon that he would not be a good candidate for a spinal cord stimulator based on the type of pain that he is experiencing and that most of it is in the back.  He could be an adequate candidate for an intrathecal pump if he was to fail the oral medication.  However, with the recent changes that we made, he has indicated that he is doing much better and thanks to the recent facet block, he was able to go hiking this past weekend.  Is very excited about this.  We talked today about his medications and he seems to be doing well, but he still having some occasional shooting pains and burning sensations which would go along with the neuropathic component to his pain.  He is currently not taking any Neurontin or Lyrica and although he did try some Neurontin in the past, it was under different circumstances.  He is not recall having had any kind of problems with it and therefore today we will start again in new trial.  I have talked to him about the medication and the fact that it takes 2 to 3 weeks to get into his system and that he should take it regularly instead of a PRN basis like he should be taking the opioid analgesics and the muscle relaxants.  He understood and accepted.  Therefore, today's plan is to adjust his pharmacological regimen and to start him on a gabapentin trial.  Post-Procedure Evaluation  Procedure: Failed attempt at  radiofrequency ablation of right lumbar facet medial branch, secondary to inability to attain adequate ID stimulation, under fluoroscopic guidance and minimal sedation.  Since I was unable to perform the radiofrequency ablation, we simply switch to a palliative right-sided lumbar facet block. Pre-procedure pain level:  4/10 Post-procedure: 0/10 (100% relief)  Sedation: Sedation provided.  Dewayne Shorter, RN  06/30/2019 12:24 PM  Signed Pain relief after procedure (treated area only): (Questions asked to patient) 1. Starting about 15 minutes after the procedure, and "while the area was still numb" (from the local anesthetics), were you having any of your usual pain "in that area"? (NOT including the discomfort from the needle sticks.) First 1 hour: 0 % better. First 4-6 hours 0 % better. 2. Assuming that it did get numb. How long was the area numb? 0 % benefit, longer than 6 hours. How long? Couple of days 3. How much better is your pain now, when compared to before the procedure? Current benefit: 70 % better. 4. Can you move better now? Improvement in ROM (Range of Motion): Yes. 5. Can you do more now? Improvement in function: Yes. 4. Did you have any problems with the procedure? Side-effects/Complications: No.   Current benefits: Defined as benefit that persist at this time.   Analgesia:  >50% relief Function: Dean Ford reports improvement  in function ROM: Dean Ford reports improvement in ROM   Pharmacotherapy Assessment  Analgesic: Oxycodone IR 10 mg. 1 tab PO q 6 hrs (40 mg/day of oxycodone) MME/day:40 mg/day.   Monitoring: Pharmacotherapy: No side-effects or adverse reactions reported. [place] PMP: PDMP reviewed during this encounter.       Compliance: No problems identified. Effectiveness: Clinically acceptable. Plan: Refer to "POC".  UDS:  Summary  Date Value Ref Range Status  10/20/2018 FINAL  Final    Comment:     ==================================================================== TOXASSURE SELECT 13 (MW) ==================================================================== Test                             Result       Flag       Units Drug Present and Declared for Prescription Verification   Tramadol                       >2513        EXPECTED   ng/mg creat   O-Desmethyltramadol            >2513        EXPECTED   ng/mg creat   N-Desmethyltramadol            1203         EXPECTED   ng/mg creat    Source of tramadol is a prescription medication.    O-desmethyltramadol and N-desmethyltramadol are expected    metabolites of tramadol. ==================================================================== Test                      Result    Flag   Units      Ref Range   Creatinine              199              mg/dL      >=20 ==================================================================== Declared Medications:  The flagging and interpretation on this report are based on the  following declared medications.  Unexpected results may arise from  inaccuracies in the declared medications.  **Note: The testing scope of this panel includes these medications:  Tramadol  **Note: The testing scope of this panel does not include following  reported medications:  Baclofen  Carvedilol  Fludrocortisone  Hydrocortisone  Hydroxychloroquine  Levothyroxine  Methocarbamol  Prednisolone  Sildenafil  Testosterone  Valsartan  Vitamin D2 (Ergocalciferol) ==================================================================== For clinical consultation, please call [phone]. ====================================================================    Laboratory Chemistry Profile (12 mo)  Renal: 04/18/2019: Creatinine, Ser 0.90  Lab Results  Component Value Date   GFRAA 122 06/23/2018   GFRNONAA 105 06/23/2018   Hepatic: No results found for requested labs within last 8760 hours. Lab Results  Component Value  Date   AST 19 06/23/2018   ALT 40 06/23/2018   Other: No results found for requested labs within last 8760 hours. Note: Above Lab results reviewed.  Imaging  Last 90 days:  Mr Lumbar Spine W Wo Contrast  Result Date: 04/19/2019 CLINICAL DATA:  Initial evaluation for chronic central low back pain with extension into the right and left. History of prior surgery. EXAM: MRI LUMBAR SPINE WITHOUT AND WITH CONTRAST TECHNIQUE: Multiplanar and multiecho pulse sequences of the lumbar spine were obtained without and with intravenous contrast. CONTRAST:  10 cc of Gadavist. COMPARISON:  Most recent MRI from 06/10/2018. FINDINGS: Segmentation: Standard. Lowest well-formed disc labeled the L5-S1 level. Alignment: 3 mm  retrolisthesis of L5 on S1. Trace retrolisthesis of L4 on L5. Mild straightening of the normal lumbar lordosis. Vertebrae: Vertebral body height maintained without evidence for acute or chronic fracture. Bone marrow signal intensity diffusely decreased on T1 weighted imaging, nonspecific, but most commonly related to anemia, smoking, or obesity. No discrete or worrisome osseous lesions. Mild reactive endplate changes present about the L5-S1 interspace. No other abnormal marrow edema or enhancement. Conus medullaris and cauda equina: Conus extends to the L1 level. Conus and cauda equina appear normal. Paraspinal and other soft tissues: Normal expected post surgical changes present within the lower posterior paraspinous soft tissues. Paraspinous soft tissues demonstrate no acute finding. Few scattered small simple cyst noted within the kidneys bilaterally. Visualized visceral structures otherwise unremarkable. Disc levels: T11-12: Unremarkable. T12-L1: Unremarkable. L1-2: Unremarkable. L2-3: Negative interspace. Mild bilateral facet hypertrophy. No stenosis or impingement. L3-4: Negative interspace. Mild facet hypertrophy. No stenosis or impingement. L4-5: Mild diffuse disc bulge with disc desiccation.  Superimposed broad-based shallow left foraminal/extraforaminal disc protrusion with associated annular fissure (series 8, image 28), unchanged from previous. Mild bilateral facet hypertrophy. Borderline mild spinal stenosis, stable. Mild left greater than right L4 foraminal narrowing also unchanged. L5-S1: Mild retrolisthesis. Chronic intervertebral disc space narrowing with diffuse disc bulge and disc desiccation. Reactive endplate changes with marginal endplate osteophytic spurring. Postoperative changes from prior right hemi laminectomy with micro discectomy. Enhancing postoperative granulation tissue surrounds the descending right S1 nerve root. No definite significant residual or recurrent disc protrusion seen on today's exam. Central canal remains patent. Severe right with mild left L5 foraminal stenosis, stable. IMPRESSION: 1. Postoperative changes from prior right hemi laminectomy and micro discectomy at L5-S1 with enhancing postoperative granulation tissue surrounding the descending right S1 nerve root. No definite residual or recurrent disc protrusion identified. 2. Persistent severe right neural foraminal stenosis at L5-S1, likely impinging upon the exiting right L5 nerve root. 3. Mild degenerative disc disease at L4-5 as above, stable. Electronically Signed   By: Jeannine Boga M.D.   On: 04/19/2019 01:59   Fluoro (c-arm) (<60 Min) (no Report)  Result Date: 06/22/2019 Fluoro was used, but no Radiologist interpretation will be provided. Please refer to "NOTES" tab for provider progress note.  [facility] C-arm 1-60 Min No Report  Result Date: 05/23/2019 Fluoro was used, but no Radiologist interpretation will be provided. Please refer to "NOTES" tab for provider progress note.   Assessment  The primary encounter diagnosis was Chronic pain syndrome. 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  Plan of Care  I have discontinued Maudry Mayhew. Totherow's HYDROcodone-acetaminophen and HYDROcodone-acetaminophen. I am also having him start on gabapentin and gabapentin. Additionally, I am having him maintain his hydrocortisone, testosterone cypionate, fludrocortisone, Vitamin D (Ergocalciferol), hydroxychloroquine, levothyroxine, sildenafil, carvedilol, predniSONE, olmesartan, hydrocortisone, Oxycodone HCl, baclofen, Oxycodone HCl, and Oxycodone HCl.  Pharmacotherapy (Medications Ordered): Meds ordered this encounter  Medications  . Oxycodone HCl 10 MG TABS    Sig: Take 1 tablet (10 mg total) by mouth every 6 (six) hours as needed. Must last 30 days.    Dispense:  120 tablet    Refill:  0  Chronic Pain: STOP Act (Not applicable) Fill 1 day early if closed on refill date. Do not fill until: 09/28/2019. To last until: 10/28/2019. Avoid benzodiazepines within 8 hours of opioids  . gabapentin (NEURONTIN) 300 MG capsule    Sig: Take 1 capsule (300 mg total) by mouth at bedtime for 21 days, THEN 2 capsules (600 mg total) at bedtime for 21 days, THEN 3 capsules (900 mg total) at bedtime for 21 days. Follow titration schedule.Marland Kitchen    Dispense:  126 capsule    Refill:  0    Med titration: Follow specific schedule, avoid dispensing out of order. Fill 1 day early if closed on scheduled date.  . gabapentin (NEURONTIN) 300 MG capsule    Sig: Take 3 capsules (900 mg total) by mouth at bedtime AND 1 capsule (300 mg total) 3 (three) times daily. Follow titration schedule.Marland Kitchen    Dispense:  180 capsule    Refill:  1    Med titration: Follow specific  schedule, avoid dispensing out of order. Fill 1 day early if closed on scheduled date.   Orders:  Orders Placed This Encounter  Procedures  . L-FCT Blk (PRN)    Scheduling timeframe: (PRN procedure) Dean Ford will call when needed. Clinical indication: Axial low back pain. 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  Sedation: Usually done with sedation. (May be done without sedation if so desired by patient.) Requirements: NPO x 8 hrs.; Driver; Stop blood thinners. Interval: No sooner than two weeks for diagnostic or therapeutic. No sooner than every other month for palliative.    Standing Status:   Standing    Number of Occurrences:   5    Standing Expiration Date:   07/02/2020    Scheduling Instructions:     Procedure: Lumbar facet block (AKA.: Lumbosacral medial branch nerve block)     Level: L3-4, L4-5, & L5-S1 Facets (L2, L3, L4, L5, & S1 Medial Branch Nerves)     Laterality: Bilateral    Order Specific Question:   Where will this procedure be performed?    Answer:   [facility]   Follow-up plan:   Return in about 4 months (around 10/25/2019) for (VV), (MM) to evaluate gabapentin titration.      Interventional treatment options: Under consideration:  NOTE: PLAQUENIL ANTICOAGULATION  Possible implant pump trial    Therapeutic/palliative (PRN):  Palliative Caudal ESI  Palliative right L5-S1 LESI  Palliative rightlumbar facet block #6  Diagnostic left lumbar facet block #3  Palliative right lumbar facet RFA #2 (Will NOT REPEAT. NO RFA. No adequate ID stimulation) Palliative right SI joint block #2  Palliative right IA hip joint injection Palliative Lumbar TPI/MNB #2     Recent Visits Date Type Provider Dept  06/22/19 Procedure visit Milinda Pointer, MD [facility]  05/29/19 Office Visit Milinda Pointer, MD [facility]  05/23/19 Procedure visit Milinda Pointer, MD [facility]  05/01/19 Office Visit Milinda Pointer, MD [facility]  Showing recent visits within past 90 days and meeting all other requirements   Today's Visits Date Type Provider Dept  07/03/19 Telemedicine Milinda Pointer, MD [facility]  Showing today's visits and meeting all other requirements   Future Appointments Date Type Provider Dept  08/28/19 Appointment Milinda Pointer, MD [facility]  Showing future appointments within next 90 days and meeting all other requirements   I discussed the assessment and treatment plan with the patient. The patient was provided an opportunity to ask questions and all  were answered. The patient agreed with the plan and demonstrated an understanding of the instructions.  Patient advised to call back or seek an in-person evaluation if the symptoms or condition worsens.  Total duration of non-face-to-face encounter: 18 minutes.  Note by: Gaspar Cola, MD Date: 07/03/2019; Time: 9:00 AM  Note: This dictation was prepared with Dragon dictation. Any transcriptional errors that may result from this process are unintentional.  Disclaimer:  * Given the special circumstances of the COVID-19 pandemic, the federal government has announced that the Office for Civil Rights (OCR) will exercise its enforcement discretion and will not impose penalties on physicians using telehealth in the event of noncompliance with regulatory requirements under the [place] (HIPAA) in connection with the good faith provision of telehealth during the XX123456 national public health emergency. ([place])

## 2019-07-03 ENCOUNTER — Ambulatory Visit: Payer: No Typology Code available for payment source | Attending: Pain Medicine | Admitting: Pain Medicine

## 2019-07-03 ENCOUNTER — Encounter: Payer: Self-pay | Admitting: Pain Medicine

## 2019-07-03 ENCOUNTER — Other Ambulatory Visit: Payer: Self-pay

## 2019-07-03 DIAGNOSIS — M5416 Radiculopathy, lumbar region: Secondary | ICD-10-CM

## 2019-07-03 DIAGNOSIS — G894 Chronic pain syndrome: Secondary | ICD-10-CM

## 2019-07-03 DIAGNOSIS — M47816 Spondylosis without myelopathy or radiculopathy, lumbar region: Secondary | ICD-10-CM

## 2019-07-03 DIAGNOSIS — M961 Postlaminectomy syndrome, not elsewhere classified: Secondary | ICD-10-CM

## 2019-07-03 DIAGNOSIS — M79604 Pain in right leg: Secondary | ICD-10-CM | POA: Diagnosis not present

## 2019-07-03 DIAGNOSIS — M792 Neuralgia and neuritis, unspecified: Secondary | ICD-10-CM | POA: Insufficient documentation

## 2019-07-03 DIAGNOSIS — G96198 Other disorders of meninges, not elsewhere classified: Secondary | ICD-10-CM

## 2019-07-03 DIAGNOSIS — M5441 Lumbago with sciatica, right side: Secondary | ICD-10-CM | POA: Diagnosis not present

## 2019-07-03 DIAGNOSIS — M79605 Pain in left leg: Secondary | ICD-10-CM

## 2019-07-03 DIAGNOSIS — G8929 Other chronic pain: Secondary | ICD-10-CM

## 2019-07-03 MED ORDER — GABAPENTIN 300 MG PO CAPS: ORAL_CAPSULE | ORAL | 0 refills | Status: DC

## 2019-07-03 MED ORDER — GABAPENTIN 300 MG PO CAPS: ORAL_CAPSULE | ORAL | 1 refills | Status: DC

## 2019-07-03 MED ORDER — OXYCODONE HCL 10 MG PO TABS: 10.0000 mg | ORAL_TABLET | Freq: Four times a day (QID) | ORAL | 0 refills | Status: DC | PRN

## 2019-07-03 NOTE — Patient Instructions (Addendum)
____________________________________________________________________________________________  Initial Gabapentin Titration  Medication used: Gabapentin (Generic Name) or Neurontin (Brand Name) 300 mg tablets/capsules  Note:  1. It takes 2-3 weeks of taking the medication regularly to reach steady levels. 2. Medication needs to be taken regularly in order for it to work. The medication is meant to prevent pain and therefore, taking it only when you think you need it will not work. 3. Medication is meant to prevent sharp shooting pains, electrical-like sensations, deep aching pain, and burning sensation associated with neuropathic/neurogenic pain.  Reasons to stop increasing the dose:  Reason 1: You get good relief of symptoms, in which case there is no need to increase the daily dose any further.    Reason 2: You develop some side effects, such as sleeping all of the time, difficulty concentrating, or becoming disoriented, in which case you need to go down on the dose, to the prior level, where you were not experiencing any side effects. Stay on that dose longer, to allow more time for your body to get use it, before attempting to increase it again.   Steps: Step 1: Start by taking 1 (one) tablet at bedtime for at least 7-14 days.  Step 2: After being on 1 (one) tablet for 7-14 days, then increase it to 2 (two) tablets at bedtime for another  7-14 days.  Step 3: Next, after being on 2 (two) tablets at bedtime for  7-14 days, then increase it to 3 (three) tablets at bedtime, and stay on that dose until you see your doctor.  Reasons to stop increasing the dose: Reason 1: You get good relief of symptoms, in which case there is no need to increase the daily dose any further.  Reason 2: You develop some side effects, such as sleeping all of the time, difficulty concentrating, or becoming disoriented, in which case you need to go down on the dose, to the prior level, where you were not  experiencing any side effects. Stay on that dose longer, to allow more time for your body to get use it, before attempting to increase it again.  Endpoint: Once you have reached the maximum dose you can tolerate without side-effects, contact your physician so as to evaluate the results of the regimen.   Questions: Feel free to contact us for any questions or problems at [phone] ____________________________________________________________________________________________  ____________________________________________________________________________________________  Subsequent Gabapentin Titration  Medication used: Gabapentin (Generic Name) or Neurontin (Brand Name) 300 mg tablets/capsules  Reasons to stop increasing the dose:  Reason 1: You get good relief of symptoms, in which case there is no need to increase the daily dose any further.    Reason 2: You develop some side effects, such as sleeping all of the time, difficulty concentrating, or becoming disoriented, in which case you need to go down on the dose, to the prior level, where you were not experiencing any side effects. Stay on that dose longer, to allow more time for your body to get use it, before attempting to increase it again.   Steps to increase medication:  Step 4: After  7-14 days of taking 3 (three) tablet at bedtime, and 1 (one) tablet at noon, then begin taking 1 (one) tablet in the afternoon with dinner. Stay on this dose x another 7 (seven) days.  Step 5: After  7-14 days of taking 3 (three) tablet at bedtime, 1 (one) tablet at noon, and 1 (one) tablet in the afternoon, then begin taking 1 (one) tablet in the  morning with breakfast. Stay on this dose x another 7 (seven) days. At this point you should be taking the medicine 4 (four) times a day, or about every 6 (six) hours. This daily regimen of taking the medicine 4 (four) times a day, will be maintained from now on. You should not take any doses any sooner than  every 6 (six) hours.  Step 6: After  7-14 days of taking 3 (three) tablet at bedtime, 1 (one) tablet at noon, 1 (one) tablet in the afternoon, and 1 (one) tablet in the morning, begin taking 2 (two) tablets at noon with lunch. Stay on this dose x another 7 (seven) days.   Step 7: After  7-14 days of taking 3 (three) tablet at bedtime, 2 (two) tablets at noon, 1 (one) tablet in the afternoon, and 1 (one) tablet in the morning, begin taking 2 (two) tablets in the afternoon with dinner. Stay on this dose x another  7-14 days.   Step 8: After  7-14 days of taking 3 (three) tablet at bedtime, 2 (two) tablets at noon, 2 (two) tablets in the afternoon, and 1 (one) tablet in the morning, begin taking 2 (two) tablets in the morning with breakfast. Stay on this dose x another  7-14 days. At this point you should be taking the medicine 4 (four) times a day, or about every 6 (six) hours. This daily regimen of taking the medicine 4 (four) times a day, will be maintained from now on. You should not take any doses any sooner than every 6 (six) hours.  Step 9: After  7-14 days of taking 3 (three) tablet at bedtime, 2 (two) tablets at noon, 2 (two) tablets in the afternoon, and 2 (two) tablets in the morning, begin taking 3 (three) tablets at noon with lunch. Stay on this dose x another  7-14 days.   Step 10: After 7 (seven) days of taking 3 (three) tablet at bedtime, 3 (three) tablets at noon, 2 (two) tablets in the afternoon, and 2 (two) tablets in the morning, begin taking 3 (three) tablets in the afternoon with dinner. Stay on this dose x another  7-14 days.   Step 11: After 7 (seven) days of taking 3 (three) tablet at bedtime, 3 (three) tablets at noon, 3 (three) tablets in the afternoon, and 2 (two) tablet in the morning, begin taking 3 (three) tablets in the morning with breakfast. Stay on this dose x another  7-14 days. At this point you should be taking the medicine 4 (four) times a day, or about every 6 (six)  hours. This daily regimen of taking the medicine 4 (four) times a day, will be maintained from now on.   Endpoint: Once you have reached the maximum dose you can tolerate without side-effects, contact your physician so as to evaluate the results of the regimen.   Questions: Feel free to contact us for any questions or problems at [phone] ____________________________________________________________________________________________

## 2019-07-26 ENCOUNTER — Ambulatory Visit (INDEPENDENT_AMBULATORY_CARE_PROVIDER_SITE_OTHER): Payer: No Typology Code available for payment source | Admitting: Cardiovascular Disease

## 2019-07-26 ENCOUNTER — Encounter: Payer: Self-pay | Admitting: Cardiovascular Disease

## 2019-07-26 ENCOUNTER — Other Ambulatory Visit: Payer: Self-pay

## 2019-07-26 VITALS — BP 140/92 | HR 67 | Ht 74.0 in | Wt 306.0 lb

## 2019-07-26 DIAGNOSIS — Z9989 Dependence on other enabling machines and devices: Secondary | ICD-10-CM

## 2019-07-26 DIAGNOSIS — E669 Obesity, unspecified: Secondary | ICD-10-CM

## 2019-07-26 DIAGNOSIS — G4733 Obstructive sleep apnea (adult) (pediatric): Secondary | ICD-10-CM | POA: Diagnosis not present

## 2019-07-26 DIAGNOSIS — I10 Essential (primary) hypertension: Secondary | ICD-10-CM | POA: Diagnosis not present

## 2019-07-26 DIAGNOSIS — E785 Hyperlipidemia, unspecified: Secondary | ICD-10-CM

## 2019-07-26 LAB — HEPATIC FUNCTION PANEL
ALT: 33 IU/L (ref 0–44)
AST: 18 IU/L (ref 0–40)
Albumin: 4.7 g/dL (ref 4.0–5.0)
Alkaline Phosphatase: 54 IU/L (ref 39–117)
Bilirubin Total: 0.6 mg/dL (ref 0.0–1.2)
Bilirubin, Direct: 0.13 mg/dL (ref 0.00–0.40)
Total Protein: 6.8 g/dL (ref 6.0–8.5)

## 2019-07-26 LAB — LIPID PANEL
Chol/HDL Ratio: 5.7 ratio — ABNORMAL HIGH (ref 0.0–5.0)
Cholesterol, Total: 218 mg/dL — ABNORMAL HIGH (ref 100–199)
HDL: 38 mg/dL — ABNORMAL LOW (ref >39)
LDL Chol Calc (NIH): 141 mg/dL — ABNORMAL HIGH (ref 0–99)
Triglycerides: 213 mg/dL — ABNORMAL HIGH (ref 0–149)
VLDL Cholesterol Cal: 39 mg/dL (ref 5–40)

## 2019-07-26 LAB — BASIC METABOLIC PANEL
BUN/Creatinine Ratio: 7 — ABNORMAL LOW (ref 9–20)
BUN: 8 mg/dL (ref 6–24)
CO2: 24 mmol/L (ref 20–29)
Calcium: 9.5 mg/dL (ref 8.7–10.2)
Chloride: 104 mmol/L (ref 96–106)
Creatinine, Ser: 1.1 mg/dL (ref 0.76–1.27)
GFR calc Af Amer: 94 mL/min/{1.73_m2} (ref >59)
GFR calc non Af Amer: 81 mL/min/{1.73_m2} (ref >59)
Glucose: 87 mg/dL (ref 65–99)
Potassium: 4.3 mmol/L (ref 3.5–5.2)
Sodium: 142 mmol/L (ref 134–144)

## 2019-07-26 NOTE — Progress Notes (Signed)
Cardiology Office Note   Date:  07/26/2019   ID:  Dean Ford, DOB July 13, 1976, MRN [id number]  PCP:  Hubbard Hartshorn, FNP  Cardiologist:   Mertie Moores, MD   Chief Complaint  Patient presents with  . Hypertension  . Hyperlipidemia   1. Hypertension 2. Chest pain - had a negative stress echo in Jan. 2016 3. Hypothyroidism 4.  Obstructive sleep apnea 5.  Adrenal insufficiency   [name] is a 71 who is referred by Dr. Debbora Dus for evaluation of his CP. He has a hx of HTN and also has hypoadrenalism, hypothyroidism, low testosterone.  CP is just left of center. Pain seems to be related to emotional stress. Starts with dyspnea. He would typically get shortness of breath, head ache and then get chest pain. Would take another losartan and all the symptoms would improve after an hour. Associated with exertion - walking . Seems to be worse in the afternoon  He snores at night. Wakes up gasping for air frequently. Has been referred for sleep study. He did not make the appt.  Does not get any regular exercise.  Has low energy due to his endocrine issues. Has had a head MRI. pituitary gland was ok. Has had cholesterol checked in the past - was borderline years ago.  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 Was not able to complete his last training session because of shortness of breath.   September 20, 2014:  Dean Ford is a 43 y.o. male who presents for follow-up for his chest pain. He had an echocardiogram that revealed normal left ventricle systolic function. He had a stress echo ( results are not found at this time) He has not been on his BP med.   BP has been higher .    No further CP,  Not exercising at all.   December 13, 2014:  Dean Ford had some chest pain - mid sternal, radiated through his back Worsened with deep breath / deep exhale  Went to the [facility] ER  Work up was negative  Has felt fine since that time.   Has been working out vigorously without any chest   December 05, 2015 Dean Ford is seen for follow up .   Has had elevated BP . Has been painting his bathroom, gets sweaty, red faced. Has some chest tightness.  Has symptoms of sleep apnea. Snores, wakes up snoring,  Not getting any other exercise  Trying to stay away from fast foods.  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  Has gained 9 lbs in the past 6 months    Oct. 17, 2017: [name] is seen back today  He was diagnosed with adrenal insufficiency approximate one year ago. He's been on Cortef and Florinef since that time. Recently he's been having problems with high blood pressures. He tries to watch his salt intake. 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  Feels dehydrated all of the time.   Drinks lots of fluids all day long and then has to urinate all night long .   Jan. 12, 2018:   Dean Ford is seen today for follow-up visit. Has gained weight - 10 lbs since Oct. 2017. Had  microdisc surgery 8 weeks ago. He had a ruptured disc between L5 and S1. He's back to work. He's not necessarily exercising much.  He takes the Florinef every 2-3 days .  Has cut out lots of his salty snacks  Has a poor appetite recently .   Has OSA,  Is going to get CPAP within the month   July 24, 2017:   doing ok Restarting back in his exercise  Working as a Manufacturing systems engineer for [employer] Has an ammonia smell to his sweat .   July 25, 2018: Dean Ford is doing fairly well.  He has a history of hypertension and obstructive sleep apnea.  He also has a history of adrenal insufficiency.  He is on Cortef and Florinef.  Had another back surgery .  No recent CP  BP looks good ,  Does not check at home Has to eat extra salt due to his adrenal insufficiency  Especially during the summer   Dec. 9, 2020  Dean Ford is seen for follow up for his hypertension.  He has a history of adrenal insufficiency.  Has questions about his [place]. And HTN associated with that  He has been taking double dose Florinef and his BP inceased.  Is exercising some .   Is playing some disc golf.   Unable to walk continuously .    Wt is 306 lbs.    Wt Readings from Last 3 Encounters:  06/22/19 300 lb (136.1 kg)  05/23/19 300 lb (136.1 kg)  04/27/19 (!) 301 lb 6 oz (136.7 kg)      Past Medical History:  Diagnosis Date  . Acute lumbar radiculopathy (Right) (S1) 05/26/2016  . Acute myofascial pain 05/04/2017  . Acute postoperative pain 06/22/2019  . Adrenal insufficiency ([place])   . Anxiety   . Anxiety disorder 01/28/2015  . Arthritis   . Back pain, chronic 06/03/2015  . Hypertension   . Hypothyroid   . Sleep apnea   . Thyroid disease     Past Surgical History:  Procedure Laterality Date  . BACK SURGERY    . COLONOSCOPY WITH PROPOFOL N/A 02/23/2019   Procedure: COLONOSCOPY WITH PROPOFOL;  Surgeon: Virgel Manifold, MD;  Location: [facility] ENDOSCOPY;  Service: Endoscopy;  Laterality: N/A;  . discectomy N/A 2017, 2018, 2019   x3  L5-S1  . ELBOW SURGERY     right elbow     Current Outpatient Medications  Medication Sig Dispense Refill  . [START ON 08/14/2019] baclofen (LIORESAL) 10 MG tablet Take 1 tablet (10 mg total) by mouth 4 (four) times daily. 120 tablet 2  . carvedilol (COREG) 6.25 MG tablet TAKE 1 TABLET BY MOUTH TWO TIMES DAILY 180 tablet 3  . fludrocortisone (FLORINEF) 0.1 MG tablet Take 0.1 mg by mouth daily as needed for dizziness.    . gabapentin (NEURONTIN) 300 MG capsule Take 1 capsule (300 mg total) by mouth at bedtime for 21 days, THEN 2 capsules (600 mg total) at bedtime for 21 days, THEN 3 capsules (900 mg total) at bedtime for 21 days. Follow titration schedule.Marland Kitchen 126 capsule 0  . [START ON 09/03/2019] gabapentin (NEURONTIN) 300 MG capsule  Take 3 capsules (900 mg total) by mouth at bedtime AND 1 capsule (300 mg total) 3 (three) times daily. Follow titration schedule.. 180 capsule 1  . hydrocortisone (CORTEF) 10 MG tablet Take 10 mg by mouth daily.     . hydrocortisone (CORTEF) 5 MG tablet     . hydroxychloroquine (PLAQUENIL) 200 MG  tablet Take 1 tablet by mouth daily.  3  . levothyroxine (SYNTHROID, LEVOTHROID) 100 MCG tablet Take 1 tablet by mouth daily.  3  . olmesartan (BENICAR) 40 MG tablet Take 1 tablet (40 mg total) by mouth daily. 30 tablet 11  . [START ON 07/30/2019] Oxycodone HCl 10 MG TABS Take 1 tablet (10 mg total) by mouth every 6 (six) hours as needed. Must last 30 days. 120 tablet 0  . [START ON 08/29/2019] Oxycodone HCl 10 MG TABS Take 1 tablet (10 mg total) by mouth every 6 (six) hours as needed. Must last 30 days. 120 tablet 0  . [START ON 09/28/2019] Oxycodone HCl 10 MG TABS Take 1 tablet (10 mg total) by mouth every 6 (six) hours as needed. Must last 30 days. 120 tablet 0  . predniSONE (DELTASONE) 5 MG tablet Take 5 mg by mouth daily with breakfast. take 1 1/2 (7.5mg ) qd    . sildenafil (VIAGRA) 100 MG tablet Take 1 tablet by mouth daily as needed.  12  . testosterone cypionate (DEPOTESTOTERONE CYPIONATE) 200 MG/ML injection Inject 100 mg into the muscle every 14 (fourteen) days.     . Vitamin D, Ergocalciferol, (DRISDOL) 50000 units CAPS capsule Take 50,000 Units by mouth once a week.  2   No current facility-administered medications for this visit.     Allergies:   Borax    Social History:  The patient  reports that he quit smoking about 23 years ago. He quit smokeless tobacco use about 23 years ago. He reports current alcohol use. He reports that he does not use drugs.   Family History:  The patient's family history includes Heart attack (age of onset: 26) in his father; Hypertension in his father and sister; Osteoarthritis in his mother.    ROS: As noted in the current history.  All other systems are  negative.   Physical Exam: There were no vitals taken for this visit.  GEN:  Well nourished, well developed in no acute distress HEENT: Normal NECK: No JVD; No carotid bruits LYMPHATICS: No lymphadenopathy CARDIAC: RRR , no murmurs, rubs, gallops RESPIRATORY:  Clear to auscultation without rales, wheezing or rhonchi  ABDOMEN: Soft, non-tender, non-distended MUSCULOSKELETAL:  No edema; No deformity  SKIN: Warm and dry NEUROLOGIC:  Alert and oriented x 3   EKG:   July 26, 2019: Normal sinus rhythm at 67.  Nonspecific ST abnormalities.    Recent Labs: 04/18/2019: Creatinine, Ser 0.90    Lipid Panel    Component Value Date/Time   CHOL 230 (H) 09/26/2018 0907   CHOL 239 (H) 07/23/2017 0901   TRIG 183 (H) 09/26/2018 0907   HDL 44 09/26/2018 0907   HDL 34 (L) 07/23/2017 0901   CHOLHDL 5.2 (H) 09/26/2018 0907   VLDL 58 (H) 03/12/2016 0755   LDLCALC 154 (H) 09/26/2018 0907      Wt Readings from Last 3 Encounters:  06/22/19 300 lb (136.1 kg)  05/23/19 300 lb (136.1 kg)  04/27/19 (!) 301 lb 6 oz (136.7 kg)      Other studies Reviewed: Additional studies/ records that were reviewed today include:  Stress echo and resting echo . Review of the above records demonstrates: normal LV function    ASSESSMENT AND PLAN:  1. Hypertension -   blood pressure is only minimally elevated.  He had been taking double the dose of Florinef because he was having leg cramps.  I have cautioned him about taking extra as this is going to make his  blood pressure higher.  I have advised him to discuss his Florinef dosing with his endocrinologist.   2. Chest pain -no further episodes of CP    3. Hypothyroidism -   Managed by endocrinology   4. ? Obstructive sleep apnea -    5. Adrenal insufficiency:.   Managed by Dr. Chalmers Cater    Current medicines are reviewed at length with the patient today.  The patient does not have concerns regarding medicines.  The following changes have been made: No  changes.        Signed, Mertie Moores, MD  07/26/2019 8:59 AM    [facility] [address], [place], [place]  [address] Phone: [phone]; Fax: [phone]

## 2019-07-26 NOTE — Patient Instructions (Signed)
Medication Instructions:  Your physician recommends that you continue on your current medications as directed. Please refer to the Current Medication list given to you today.  *If you need a refill on your cardiac medications before your next appointment, please call your pharmacy*  Lab Work: TODAY - cholesterol, liver panel, basic metabolic panel If you have labs (blood work) drawn today and your tests are completely normal, you will receive your results only by: Marland Kitchen MyChart Message (if you have MyChart) OR . A paper copy in the mail If you have any lab test that is abnormal or we need to change your treatment, we will call you to review the results.  Testing/Procedures: None Ordered   Follow-Up: At [facility], you and your health needs are our priority.  As part of our continuing mission to provide you with exceptional heart care, we have created designated Provider Care Teams.  These Care Teams include your primary Cardiologist (physician) and Advanced Practice Providers (APPs -  Physician Assistants and Nurse Practitioners) who all work together to provide you with the care you need, when you need it.  Your next appointment:   6 month(s)  The format for your next appointment:   Either In Person or Virtual  Provider:   Richardson Dopp, PA-C, Robbie Lis, PA-C or Daune Perch, NP   DASH Eating Plan DASH stands for "Dietary Approaches to Stop Hypertension." The DASH eating plan is a healthy eating plan that has been shown to reduce high blood pressure (hypertension). It may also reduce your risk for type 2 diabetes, heart disease, and stroke. The DASH eating plan may also help with weight loss. What are tips for following this plan?  General guidelines  Avoid eating more than 2,300 mg (milligrams) of salt (sodium) a day. If you have hypertension, you may need to reduce your sodium intake to 1,500 mg a day.  Limit alcohol intake to no more than 1 drink a day for nonpregnant women  and 2 drinks a day for men. One drink equals 12 oz of beer, 5 oz of wine, or 1 oz of hard liquor.  Work with your health care provider to maintain a healthy body weight or to lose weight. Ask what an ideal weight is for you.  Get at least 30 minutes of exercise that causes your heart to beat faster (aerobic exercise) most days of the week. Activities may include walking, swimming, or biking.  Work with your health care provider or diet and nutrition specialist (dietitian) to adjust your eating plan to your individual calorie needs. Reading food labels   Check food labels for the amount of sodium per serving. Choose foods with less than 5 percent of the Daily Value of sodium. Generally, foods with less than 300 mg of sodium per serving fit into this eating plan.  To find whole grains, look for the word "whole" as the first word in the ingredient list. Shopping  Buy products labeled as "low-sodium" or "no salt added."  Buy fresh foods. Avoid canned foods and premade or frozen meals. Cooking  Avoid adding salt when cooking. Use salt-free seasonings or herbs instead of table salt or sea salt. Check with your health care provider or pharmacist before using salt substitutes.  Do not fry foods. Cook foods using healthy methods such as baking, boiling, grilling, and broiling instead.  Cook with heart-healthy oils, such as olive, canola, soybean, or sunflower oil. Meal planning  Eat a balanced diet that includes: ? 5 or more  servings of fruits and vegetables each day. At each meal, try to fill half of your plate with fruits and vegetables. ? Up to 6-8 servings of whole grains each day. ? Less than 6 oz of lean meat, poultry, or fish each day. A 3-oz serving of meat is about the same size as a deck of cards. One egg equals 1 oz. ? 2 servings of low-fat dairy each day. ? A serving of nuts, seeds, or beans 5 times each week. ? Heart-healthy fats. Healthy fats called Omega-3 fatty acids are  found in foods such as flaxseeds and coldwater fish, like sardines, salmon, and mackerel.  Limit how much you eat of the following: ? Canned or prepackaged foods. ? Food that is high in trans fat, such as fried foods. ? Food that is high in saturated fat, such as fatty meat. ? Sweets, desserts, sugary drinks, and other foods with added sugar. ? Full-fat dairy products.  Do not salt foods before eating.  Try to eat at least 2 vegetarian meals each week.  Eat more home-cooked food and less restaurant, buffet, and fast food.  When eating at a restaurant, ask that your food be prepared with less salt or no salt, if possible. What foods are recommended? The items listed may not be a complete list. Talk with your dietitian about what dietary choices are best for you. Grains Whole-grain or whole-wheat bread. Whole-grain or whole-wheat pasta. Brown rice. [place]. Bulgur. Whole-grain and low-sodium cereals. Pita bread. Low-fat, low-sodium crackers. Whole-wheat flour tortillas. Vegetables Fresh or frozen vegetables (raw, steamed, roasted, or grilled). Low-sodium or reduced-sodium tomato and vegetable juice. Low-sodium or reduced-sodium tomato sauce and tomato paste. Low-sodium or reduced-sodium canned vegetables. Fruits All fresh, dried, or frozen fruit. Canned fruit in natural juice (without added sugar). Meat and other protein foods Skinless chicken or [place]. Ground chicken or [place]. Pork with fat trimmed off. Fish and seafood. Egg whites. Dried beans, peas, or lentils. Unsalted nuts, nut butters, and seeds. Unsalted canned beans. Lean cuts of beef with fat trimmed off. Low-sodium, lean deli meat. Dairy Low-fat (1%) or fat-free (skim) milk. Fat-free, low-fat, or reduced-fat cheeses. Nonfat, low-sodium ricotta or cottage cheese. Low-fat or nonfat yogurt. Low-fat, low-sodium cheese. Fats and oils Soft margarine without trans fats. Vegetable oil. Low-fat, reduced-fat, or light mayonnaise  and salad dressings (reduced-sodium). Canola, safflower, olive, soybean, and sunflower oils. Avocado. Seasoning and other foods Herbs. Spices. Seasoning mixes without salt. Unsalted popcorn and pretzels. Fat-free sweets. What foods are not recommended? The items listed may not be a complete list. Talk with your dietitian about what dietary choices are best for you. Grains Baked goods made with fat, such as croissants, muffins, or some breads. Dry pasta or rice meal packs. Vegetables Creamed or fried vegetables. Vegetables in a cheese sauce. Regular canned vegetables (not low-sodium or reduced-sodium). Regular canned tomato sauce and paste (not low-sodium or reduced-sodium). Regular tomato and vegetable juice (not low-sodium or reduced-sodium). Angie Fava. Olives. Fruits Canned fruit in a light or heavy syrup. Fried fruit. Fruit in cream or butter sauce. Meat and other protein foods Fatty cuts of meat. Ribs. Fried meat. Berniece Salines. Sausage. Bologna and other processed lunch meats. Salami. Fatback. Hotdogs. Bratwurst. Salted nuts and seeds. Canned beans with added salt. Canned or smoked fish. Whole eggs or egg yolks. Chicken or [place] with skin. Dairy Whole or 2% milk, cream, and half-and-half. Whole or full-fat cream cheese. Whole-fat or sweetened yogurt. Full-fat cheese. Nondairy creamers. Whipped toppings. Processed cheese and cheese  spreads. Fats and oils Butter. Stick margarine. Lard. Shortening. Ghee. Bacon fat. Tropical oils, such as coconut, palm kernel, or palm oil. Seasoning and other foods Salted popcorn and pretzels. Onion salt, garlic salt, seasoned salt, table salt, and sea salt. Worcestershire sauce. Tartar sauce. Barbecue sauce. Teriyaki sauce. Soy sauce, including reduced-sodium. Steak sauce. Canned and packaged gravies. Fish sauce. Oyster sauce. Cocktail sauce. Horseradish that you find on the shelf. Ketchup. Mustard. Meat flavorings and tenderizers. Bouillon cubes. Hot sauce and Tabasco  sauce. Premade or packaged marinades. Premade or packaged taco seasonings. Relishes. Regular salad dressings. Where to find more information:  National Heart, Lung, and [place]: https://wilson-eaton.com/  American Heart Association: www.heart.org Summary  The DASH eating plan is a healthy eating plan that has been shown to reduce high blood pressure (hypertension). It may also reduce your risk for type 2 diabetes, heart disease, and stroke.  With the DASH eating plan, you should limit salt (sodium) intake to 2,300 mg a day. If you have hypertension, you may need to reduce your sodium intake to 1,500 mg a day.  When on the DASH eating plan, aim to eat more fresh fruits and vegetables, whole grains, lean proteins, low-fat dairy, and heart-healthy fats.  Work with your health care provider or diet and nutrition specialist (dietitian) to adjust your eating plan to your individual calorie needs. This information is not intended to replace advice given to you by your health care provider. Make sure you discuss any questions you have with your health care provider. Document Released: 07/23/2011 Document Revised: 07/16/2017 Document Reviewed: 07/27/2016 Elsevier Patient Education  2020 [employer].

## 2019-08-04 ENCOUNTER — Ambulatory Visit: Payer: No Typology Code available for payment source | Admitting: Family Medicine

## 2019-08-28 ENCOUNTER — Telehealth: Payer: No Typology Code available for payment source | Admitting: Pain Medicine

## 2019-10-24 ENCOUNTER — Encounter: Payer: Self-pay | Admitting: Pain Medicine

## 2019-10-24 NOTE — Progress Notes (Signed)
Patient: Dean Ford  Service Category: E/M  Provider: Gaspar Cola, MD  DOB: Jun 22, 1976  DOS: 10/25/2019  Location: Office  MRN: [id number]  Setting: Ambulatory outpatient  Referring Provider: Hubbard Hartshorn, FNP  Type: Established Patient  Specialty: Interventional Pain Management  PCP: Hubbard Hartshorn, FNP  Location: Remote location  Delivery: TeleHealth     Virtual Encounter - Pain Management PROVIDER NOTE: Information contained herein reflects review and annotations entered in association with encounter. Interpretation of such information and data should be left to medically-trained personnel. Information provided to patient can be located elsewhere in the medical record under "Patient Instructions". Document created using STT-dictation technology, any transcriptional errors that may result from process are unintentional.    Contact & Pharmacy Preferred: [phone] Home: [phone] (home) Mobile: [phone] (mobile) E-mail: [email or web address]  [place], [place] - [address] [place] [address] Phone: [phone] Fax: [phone]   Pre-screening  Dean Ford offered "in-person" vs "virtual" encounter. He indicated preferring virtual for this encounter.   Reason COVID-19*  Social distancing based on CDC and AMA recommendations.   I contacted Dean Ford on 10/25/2019 via telephone.      I clearly identified myself as Gaspar Cola, MD. I verified that I was speaking with the correct person using two identifiers (Name: Dean Ford, and date of birth: 06/14/1976).  Consent I sought verbal advanced consent from Dean Ford for virtual visit interactions. I informed Dean Ford of possible security and privacy concerns, risks, and limitations associated with providing "not-in-person" medical evaluation and management services. I also informed Dean Ford of the availability of "in-person" appointments. Finally, I  informed him that there would be a charge for the virtual visit and that he could be  personally, fully or partially, financially responsible for it. Dean Ford expressed understanding and agreed to proceed.   Historic Elements   Dean Ford is a 44 y.o. year old, male patient evaluated today after his last contact with our practice on 06/23/2019. Dean Ford  has a past medical history of Acute lumbar radiculopathy (Right) (S1) (05/26/2016), Acute myofascial pain (05/04/2017), Acute postoperative pain (06/22/2019), Adrenal insufficiency ([place]), Anxiety, Anxiety disorder (01/28/2015), Arthritis, Back pain, chronic (06/03/2015), Hypertension, Hypothyroid, Sleep apnea, and Thyroid disease. He also  has a past surgical history that includes Elbow surgery; Back surgery; Colonoscopy with propofol (N/A, 02/23/2019); and discectomy (N/A, 2017, 2018, 2019). Dean Ford has a current medication list which includes the following prescription(s): baclofen, carvedilol, fludrocortisone, gabapentin, hydroxychloroquine, levothyroxine, olmesartan, oxycodone hcl, prednisone, sildenafil, testosterone cypionate, and vitamin d (ergocalciferol). He  reports that he quit smoking about 23 years ago. He quit smokeless tobacco use about 23 years ago. He reports current alcohol use. He reports that he does not use drugs. Dean Ford is allergic to borax.   HPI  Today, he is being contacted for medication management. The patient indicates doing well with the current medication regimen. No adverse reactions or side effects reported to the medications.  The patient indicates having no problems with the oxycodone, but he has noticed that when he takes the baclofen, it causes him to be a little nauseous secondary to "motion sickness".  He refers trying to lose some weight and he apparently has a problem retraining electrolytes.  For this reason, he has an ongoing issue with back spasms that do respond to the baclofen, but unfortunately he also gets this  "motion sickness".  Today we talked about the possible causes for that and he refers that he actually takes 2 tablets at bedtime but he does not take any during the day because of this side effect.  He also indicates that he has gone to trying to take the Tylenol earlier in the evening so that he does not have that type of hangover side effect.  This seems to work better.  Pharmacotherapy Assessment  Analgesic: Oxycodone IR 10 mg. 1 tab PO q 6 hrs (40 mg/day of oxycodone) MME/day:40 mg/day.   Monitoring: [place] PMP: PDMP reviewed during this encounter.       Pharmacotherapy: No side-effects or adverse reactions reported. Compliance: No problems identified. Effectiveness: Clinically acceptable. Plan: Refer to "POC".  UDS:  Summary  Date Value Ref Range Status  10/20/2018 FINAL  Final    Comment:    ==================================================================== TOXASSURE SELECT 13 (MW) ==================================================================== Test                             Result       Flag       Units Drug Present and Declared for Prescription Verification   Tramadol                       >2513        EXPECTED   ng/mg creat   O-Desmethyltramadol            >2513        EXPECTED   ng/mg creat   N-Desmethyltramadol            1203         EXPECTED   ng/mg creat    Source of tramadol is a prescription medication.    O-desmethyltramadol and N-desmethyltramadol are expected    metabolites of tramadol. ==================================================================== Test                      Result    Flag   Units      Ref Range   Creatinine              199              mg/dL      >=20 ==================================================================== Declared Medications:  The flagging and interpretation on this report are based on the  following declared medications.  Unexpected results may arise from  inaccuracies in the declared medications.  **Note: The  testing scope of this panel includes these medications:  Tramadol  **Note: The testing scope of this panel does not include following  reported medications:  Baclofen  Carvedilol  Fludrocortisone  Hydrocortisone  Hydroxychloroquine  Levothyroxine  Methocarbamol  Prednisolone  Sildenafil  Testosterone  Valsartan  Vitamin D2 (Ergocalciferol) ==================================================================== For clinical consultation, please call [phone]. ====================================================================    Laboratory Chemistry Profile   Renal Lab Results  Component Value Date   BUN 8 07/26/2019   CREATININE 1.10 07/26/2019   BCR 7 (L) 07/26/2019   GFRAA 94 07/26/2019   GFRNONAA 81 07/26/2019    Hepatic Lab Results  Component Value Date   AST 18 07/26/2019   ALT 33 07/26/2019   ALBUMIN 4.7 07/26/2019   ALKPHOS 54 07/26/2019    Electrolytes Lab Results  Component Value Date   NA 142 07/26/2019   K 4.3 07/26/2019   CL 104 07/26/2019   CALCIUM 9.5 07/26/2019   MG 2.1 12/19/2012  Bone No results found for: VD25OH, [id number], [id number], [id number], 25OHVITD1, 25OHVITD2, 25OHVITD3, TESTOFREE, TESTOSTERONE  Inflammation (CRP: Acute Phase) (ESR: Chronic Phase) No results found for: CRP, ESRSEDRATE, LATICACIDVEN    Note: Above Lab results reviewed.  Imaging  Fluoro (C-Arm) (<60 min) (No Report) Fluoro was used, but no Radiologist interpretation will be provided.  Please refer to "NOTES" tab for provider progress note.  Assessment  The primary encounter diagnosis was Chronic pain syndrome. Diagnoses of Chronic low back pain (Primary Area of Pain) (Bilateral) (R>L), Chronic lower extremity pain (Secondary Area of Pain) (Bilateral) (R>L), and Chronic musculoskeletal pain were also pertinent to this visit.  Plan of Care  Problem-specific:  No problem-specific Assessment & Plan notes found for this encounter.  Mr. LAMBERT Ford has a  current medication list which includes the following long-term medication(s): baclofen, carvedilol, levothyroxine, olmesartan, oxycodone hcl, and testosterone cypionate.  Pharmacotherapy (Medications Ordered): No orders of the defined types were placed in this encounter.  Orders:  No orders of the defined types were placed in this encounter.  Follow-up plan:   Return in about 13 weeks (around 01/24/2020) for (VV), (MM).      Interventional treatment options: Under consideration:  NOTE: PLAQUENIL ANTICOAGULATION  Possible implant pump trial    Therapeutic/palliative (PRN):  Palliative Caudal ESI  Palliative right L5-S1 LESI  Palliative rightlumbar facet block #6  Diagnostic left lumbar facet block #3  Palliative right lumbar facet RFA #2 (DO NOT REPEAT. NO RFA. No adequate ID stimulation. Procedure aborted.) Palliative right SI joint block #2  Palliative right IA hip joint injection Palliative Lumbar TPI/MNB #2     Recent Visits No visits were found meeting these conditions.  Showing recent visits within past 90 days and meeting all other requirements   Future Appointments Date Type Provider Dept  10/25/19 Telemedicine Milinda Pointer, MD [facility]  Showing future appointments within next 90 days and meeting all other requirements   I discussed the assessment and treatment plan with the patient. The patient was provided an opportunity to ask questions and all were answered. The patient agreed with the plan and demonstrated an understanding of the instructions.  Patient advised to call back or seek an in-person evaluation if the symptoms or condition worsens.  Duration of encounter: 15 minutes.  Note by: Gaspar Cola, MD Date: 10/25/2019; Time: 7:12 PM

## 2019-10-25 ENCOUNTER — Ambulatory Visit: Payer: No Typology Code available for payment source | Attending: Pain Medicine | Admitting: Pain Medicine

## 2019-10-25 ENCOUNTER — Other Ambulatory Visit: Payer: Self-pay

## 2019-10-25 DIAGNOSIS — M79605 Pain in left leg: Secondary | ICD-10-CM

## 2019-10-25 DIAGNOSIS — M79604 Pain in right leg: Secondary | ICD-10-CM

## 2019-10-25 DIAGNOSIS — G8929 Other chronic pain: Secondary | ICD-10-CM

## 2019-10-25 DIAGNOSIS — G894 Chronic pain syndrome: Secondary | ICD-10-CM | POA: Diagnosis not present

## 2019-10-25 DIAGNOSIS — M5441 Lumbago with sciatica, right side: Secondary | ICD-10-CM | POA: Diagnosis not present

## 2019-10-25 DIAGNOSIS — M7918 Myalgia, other site: Secondary | ICD-10-CM | POA: Diagnosis not present

## 2019-10-25 MED ORDER — BACLOFEN 10 MG PO TABS: 10.0000 mg | ORAL_TABLET | Freq: Four times a day (QID) | ORAL | 5 refills | Status: DC

## 2019-10-25 MED ORDER — OXYCODONE HCL 10 MG PO TABS: 10.0000 mg | ORAL_TABLET | Freq: Four times a day (QID) | ORAL | 0 refills | Status: DC | PRN

## 2019-10-25 NOTE — Patient Instructions (Addendum)
____________________________________________________________________________________________  Muscle Spasms & Cramps  Cause:  The most common cause of muscle spasms and cramps is vitamin and/or electrolyte (calcium, potassium, sodium, etc.) deficiencies.  Possible triggers: Sweating - causes loss of electrolytes thru the skin. Steroids - causes loss of electrolytes thru the urine.  Treatment: 1. Gatorade (or any other electrolyte-replenishing drink) - Take 1, 8 oz glass with each meal (3 times a day). 2. OTC (over-the-counter) Magnesium 400 to 500 mg - Take 1 tablet twice a day (one with breakfast and one before bedtime). If you have kidney problems, talk to your primary care physician before taking any Magnesium. 3. Tonic Water with quinine - Take 1, 8 oz glass before bedtime.   ____________________________________________________________________________________________   ____________________________________________________________________________________________  Pain Management Weight Control Diet   Note: Before starting this diet, make sure to talk to your primary care physician to be sure it is safe for you.   BMI:  Your current Estimated body mass index is 39.29 kg/m as calculated from the following:   Height as of 07/26/19: 6' 2"  (1.88 m).   Weight as of 07/26/19: 306 lb (138.8 kg).  Breakfast:   Drink 12 oz of cold water 15-30 minutes prior to meal  1 boiled or pouched egg. You may use the egg white ready-made preparations.  1 wheat low calorie toast.   8 oz. black coffee without milk, creamer, or any type of sweetener. (If a sweetener must be used, then use stevia).   Lunch & Dinner:   Drink 12-24 oz. of ice water, 30 minutes prior to meal  5 oz. Lean Protein like chicken, fish, or lean meat (above 95% fat free).   No cold cuts or processed meats. (No red meat)  Steamed, baked or grilled. (Never fried).  No oils, fats, lard, or butter.   1 cup of steamed  vegetables, or 1.5 cups if raw.   1 serving of salad or greens (No dressings, except vinegar or lemon juice).   Mid-day & Mid-afternoon snack:   1 fruit. No bananas. (maximum of 2 fruits per day)   Important Rules:  1. Drink water. Must drink 100 oz. or more of water per day.   Consult your Primary Care Physician if you have a history of kidney failure, or congestive heart failure before doing this.  2. Take calcium and magnesium every day to avoid night cramps.   Consult your Primary Care Physician if you have a history of kidney failure, hypercalcemia, or parathyroid problems before doing this.   Over-the-counter calcium 1200 mg/day (in the morning). Take with Vitamin D 5000 IU every day.   Over-the-counter magnesium 400 to 500 mg per day (1-2 hours prior to bedtime).  3. Avoid salt. Never add any salt and avoid salty foods. Do not take more than 2,300 mg/day. 4. Avoid eating anything after 6:00 pm.  5. Weight yourself every morning at the same time and record weight on a notebook.  6. Mix "Benefiber" 3 to 5 table spoons in water and drink before each meals.  7. No sodas.  8. No alcohol.  9. No sugar.  10. No artificial sweeteners.   Stevia without sugar is the only sweetener aloud.  11. No bread except for the one breakfast toast.   Low calorie wheat bread.  12. Duration of diet: 2 weeks at a time with 2 days' rest, then repeat, until BMI is less than 30.  13. Restrict food volume. When feeding restrict the total volume of food that you  eat at a meal. Never eat more than what you can hold in the palm of one hand. 14. Eat very slow. Chew food twenty (20) times before swallowing. 15. Restrict amount of times that you eat. Do not feed any sooner than every 4 hours. 16. Do not over-eat or over-indulge yourself in the 2 days of rest from the diet.  17. Follow ancient [place] customs. Have your meals sitting on the floor.  You will notice that your stomach will be creating an  increased abdominal pressure that will not allow you to overeat. (This last one may be difficult or impossible for most chronic pain patients).     ____________________________________________________________________________________________   ______________________________________________________________________________________________  Weight Management Required  URGENT: Your weight has been found to be adversely affecting your health.  Dear [name]:  Your current Estimated body mass index is 39.29 kg/m as calculated from the following:   Height as of 07/26/19: 6' 2"  (1.88 m).   Weight as of 07/26/19: 306 lb (138.8 kg).  Please use the table below to identify your weight category and associated incidence of chronic pain, secondary to your weight.  Body Mass Index (BMI) Classification BMI level (kg/m2) Category Associated incidence of chronic pain  <18  Underweight   18.5-24.9 Ideal body weight   25-29.9 Overweight  20%  30-34.9 Obese (Class I)  68%  35-39.9 Severe obesity (Class II)  136%  >40 Extreme obesity (Class III)  254%   In addition: You will be considered "Morbidly Obese", if your BMI is above 30 and you have one or more of the following conditions which are known to be caused and/or directly associated with obesity: 1.    Type 2 Diabetes (Which in turn can lead to cardiovascular diseases (CVD), stroke, peripheral vascular diseases (PVD), retinopathy, nephropathy, and neuropathy) 2.    Cardiovascular Disease (High Blood Pressure; Congestive Heart Failure; High Cholesterol; Coronary Artery Disease; Angina; or History of Heart Attacks) 3.    Breathing problems (Asthma; obesity-hypoventilation syndrome; obstructive sleep apnea; chronic inflammatory airway disease; reactive airway disease; or shortness of breath) 4.    Chronic kidney disease 5.    Liver disease (nonalcoholic fatty liver disease) 6.    High blood pressure 7.    Acid reflux (gastroesophageal reflux disease;  heartburn) 8.    Osteoarthritis (OA) (with any of the following: hip pain; knee pain; and/or low back pain) 9.    Low back pain (Lumbar Facet Syndrome; and/or Degenerative Disc Disease) 10.  Hip pain (Osteoarthritis of hip) (For every 1 lbs of added body weight, there is a 2 lbs increase in pressure inside of each hip articulation. 1:2 mechanical relationship) 11.  Knee pain (Osteoarthritis of knee) (For every 1 lbs of added body weight, there is a 4 lbs increase in pressure inside of each knee articulation. 1:4 mechanical relationship) (patients with a BMI>30 kg/m2 were 6.8 times more likely to develop knee OA than normal-weight individuals) 12.  Cancer: Epidemiological studies have shown that obesity is a risk factor for: post-menopausal breast cancer; cancers of the endometrium, colon and kidney cancer; malignant adenomas of the oesophagus. Obese subjects have an approximately 1.5-3.5-fold increased risk of developing these cancers compared with normal-weight subjects, and it has been estimated that between 15 and 45% of these cancers can be attributed to overweight. More recent studies suggest that obesity may also increase the risk of other types of cancer, including pancreatic, hepatic and gallbladder cancer. (Ref: Obesity and cancer. Pischon T, Nthlings U, [employer] H.  Proc Nutr Soc. 2008 May;67(2):128-45. doi: 14.2395/V2023343568616837.) The International Agency for Research on Cancer (IARC) has identified 13 cancers associated with overweight and obesity: meningioma, multiple myeloma, adenocarcinoma of the esophagus, and cancers of the thyroid, postmenopausal breast cancer, gallbladder, stomach, liver, pancreas, kidney, ovaries, uterus, colon and rectal (colorectal) cancers. 40 percent of all cancers diagnosed in women and 24 percent of those diagnosed in men are associated with overweight and obesity.  Recommendation: At this point it is urgent that you take a step back and concentrate in loosing  weight. Dedicate 100% of your efforts on this task. Nothing else will improve your health more than bringing your weight down and your BMI to less than 30. If you are here, you probably have chronic pain. Because most chronic pain patients have difficulty exercising secondary to their pain, you must rely on proper nutrition and diet in order to lose the weight. If your BMI is above 40, you should seriously consider bariatric surgery. A realistic goal is to lose 10% of your body weight over a period of 12 months.  Be honest to yourself, if over time you have unsuccessfully tried to lose weight, then it is time for you to seek professional help and to enter a medically supervised weight management program, and/or undergo bariatric surgery. Stop procrastinating.   Pain management considerations:  1.    Pharmacological Problems: Be advised that the use of opioid analgesics (oxycodone; hydrocodone; morphine; methadone; codeine; and all of their derivatives) have been associated with decreased metabolism and weight gain.  For this reason, should we see that you are unable to lose weight while taking these medications, it may become necessary for [place] to taper down and indefinitely discontinue them.  2.    Technical Problems: The incidence of successful interventional therapies decreases as the patient's BMI increases. It is much more difficult to accomplish a safe and effective interventional therapy on a patient with a BMI above 35. 3.    Radiation Exposure Problems: The x-rays machine, used to accomplish injection therapies, will automatically increase their x-ray output in order to capture an appropriate bone image. This means that radiation exposure increases exponentially with the patient's BMI. (The higher the BMI, the higher the radiation exposure.) Although the level of radiation used at a given time is still safe to the patient, it is not for the physician and/or assisting staff. Unfortunately, radiation  exposure is accumulative. Because physicians and the staff have to do procedures and be exposed on a daily basis, this can result in health problems such as cancer and radiation burns. Radiation exposure to the staff is monitored by the radiation batches that they wear. The exposure levels are reported back to the staff on a quarterly basis. Depending on levels of exposure, physicians and staff may be obligated by law to decrease this exposure. This means that they have the right and obligation to refuse providing therapies where they may be overexposed to radiation. For this reason, physicians may decline to offer therapies such as radiofrequency ablation or implants to patients with a BMI above 40. 4.    Current Trends: Be advised that the current trend is to no longer offer certain therapies to patients with a BMI equal to, or above 35, due to increase perioperative risks, increased technical procedural difficulties, and excessive radiation exposure to healthcare personnel.  ______________________________________________________________________________________________

## 2019-10-31 ENCOUNTER — Other Ambulatory Visit: Payer: Self-pay | Admitting: Cardiovascular Disease

## 2019-11-14 NOTE — Progress Notes (Signed)
Patient ID: Dean Ford, male    DOB: [birth year], 44 y.o.   MRN: [id number]  PCP: Hubbard Hartshorn, FNP  Chief Complaint  Patient presents with  . Labs Only    would like kidney and liver function test, he states meds last to long in system, also feels amonia levels are high due to the way he smells  . Abdominal Pain    LLQ, when he eats acidic foods it flares     Subjective:   Dean Ford is a 44 y.o. male, presents to clinic with CC of the following:  Pt new to me, PCP out of office Several complains - GI upset/abd pain, fatigue, meds/metabolism off Pt had previously talked to GI md and he was concerned that his meds weren't getting into his system fast or normally,  And then he followed up with his pain meds doc regarding meds, he was advised to f/up with PCP  Pt is concerned about meds being strong, lasting long, feel like they are delayed working, he wants ammonia level checked, reports  strong urine odor, urine is always dark, he has other endocrine issues that affect his fluid status - but he's not sure what the dx is - he has hx of hypothyroid, low vit D, low T, and adrenal insufficiency although pt states he doesn't think his endocrinologist has dx him with that.  He complains of abd pain, esp with eating, reflux sx, he is taking Pepcid AC prn, he previously saw GI for this but decided not to do EGD, no on PPI  Lipid panel last was very high, labs weren't fasting, not on cholesterol meds, but he has been trying to eat better.  On prednisone daily per endo? Or rheum?  He hasn't been to either recently - just restarted prednisone taking 7.5 mg daily, he ran out and restarted a day or two ago  Rheumatologist plaquenil - not on right now - pt does not know what dx he is on it for Endo is [employer] - manages levothyroxine, steroids, fludrocortisone , low T  He has chronic pain, meds are knocking him out lately, feeling very strong, goes to pain  management for oxy  Pt feels fatigued, is gaining weight.  He denies any hypotension, syncopal episodes.  He is interested in getting referral to new endocrinologist, he has seen two so far.  He complains of muscle cramps and spasms.  He has chronic fluid problems - holds a lot of fluid and then will have a lot of urine output in waves - no change to what he has experienced for years.  Wt Readings from Last 5 Encounters:  11/15/19 (!) 313 lb 8 oz (142.2 kg)  07/26/19 (!) 306 lb (138.8 kg)  06/22/19 300 lb (136.1 kg)  05/23/19 300 lb (136.1 kg)  04/27/19 (!) 301 lb 6 oz (136.7 kg)   BMI Readings from Last 5 Encounters:  11/15/19 40.25 kg/m  07/26/19 39.29 kg/m  06/22/19 38.52 kg/m  05/23/19 38.52 kg/m  04/27/19 38.69 kg/m      Patient Active Problem List   Diagnosis Date Noted  . Neurogenic pain 07/03/2019  . DDD (degenerative disc disease), lumbosacral 06/22/2019  . 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 Occult blood in stools   . Rectal polyp   . Diverticulosis of large intestine without diverticulitis   . Pharmacologic therapy 02/09/2019  . Disorder of skeletal system 02/09/2019  .  Problems influencing health status 02/09/2019  . Hypogonadism male 09/28/2018  . Rheumatoid arthritis ([place]) 09/28/2018  . Carpal tunnel syndrome 09/28/2018  . Chronic musculoskeletal pain 04/25/2018  . Spondylosis without myelopathy or radiculopathy, lumbosacral region 02/08/2018  . Chronic lower extremity pain (Secondary Area of Pain) (Bilateral) (R>L) 02/08/2018  . 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 Spasm of paraspinal muscle 01/22/2016  . Lower extremity numbness (Bilateral) (R>L) 01/22/2016  . Chronic lumbar radicular pain (Right: S1, Bilateral: L5) (Bilateral) (R>L) 01/22/2016  . Chronic hip pain (Right) 01/22/2016  . Osteoarthritis of hip (Right) 01/22/2016  . Chronic low back pain  (Primary Area of Pain) (Bilateral) (R>L) 12/18/2015  . Erectile dysfunction 12/18/2015  . Chronic pain syndrome 09/02/2015  . Long term prescription opiate use 09/02/2015  . Encounter for chronic pain management 09/02/2015  . Chronic sacroiliac joint pain (Bilateral) (R>L) 06/11/2015  . Lumbar facet hypertrophy (Multilevel) 06/11/2015  . Lumbosacral radiculopathy at S1 (right-sided) 06/11/2015  . Encounter for therapeutic drug level monitoring 06/03/2015  . Long term current use of opiate analgesic 06/03/2015  . Uncomplicated opioid dependence ([place]) 06/03/2015  . Opiate use 06/03/2015  . Lumbar spondylosis 06/03/2015  . Lumbar facet syndrome (Bilateral) (R>L) 06/03/2015  . Insomnia, persistent 06/03/2015  . Intermittent muscle cramps 06/03/2015  . Always thirsty 06/03/2015  . Avitaminosis D 06/03/2015  . Lumbar spine pain 06/03/2015  . Hip arthrosis 06/03/2015  . Class 2 severe obesity due to excess calories with serious comorbidity and body mass index (BMI) of 38.0 to 38.9 in adult ([place]) 06/03/2015  . DDD (degenerative disc disease), lumbar 06/03/2015  . L4-L5 disc bulge 06/03/2015  . Ligamentum flavum hypertrophy ([place]) 06/03/2015  . Bulging lumbar disc 06/03/2015  . Sleep apnea 06/03/2015  . Dyslipidemia 03/01/2015  . Dehydration symptoms 01/28/2015  . Essential hypertension 07/20/2014  . Chest discomfort 07/20/2014  . Hypothyroidism 07/20/2014  . Adrenal insufficiency ([place]) 07/20/2014      Current Outpatient Medications:  .  baclofen (LIORESAL) 10 MG tablet, Take 1 tablet (10 mg total) by mouth 4 (four) times daily., Disp: 120 tablet, Rfl: 5 .  carvedilol (COREG) 6.25 MG tablet, TAKE 1 TABLET BY MOUTH TWO TIMES DAILY, Disp: 180 tablet, Rfl: 3 .  fludrocortisone (FLORINEF) 0.1 MG tablet, Take 0.1 mg by mouth daily as needed for dizziness., Disp: , Rfl:  .  gabapentin (NEURONTIN) 300 MG capsule, Take 1 capsule (300 mg total) by mouth at bedtime for 21 days, THEN 2 capsules  (600 mg total) at bedtime for 21 days, THEN 3 capsules (900 mg total) at bedtime for 21 days. Follow titration schedule..  Patient is taking 1 cap tid and 3 capsules at night., Disp: , Rfl:  .  levothyroxine (SYNTHROID, LEVOTHROID) 100 MCG tablet, Take 1 tablet by mouth daily., Disp: , Rfl: 3 .  olmesartan (BENICAR) 40 MG tablet, Take 1 tablet (40 mg total) by mouth daily., Disp: 30 tablet, Rfl: 11 .  Oxycodone HCl 10 MG TABS, Take 1 tablet (10 mg total) by mouth every 6 (six) hours as needed. Must last 30 days., Disp: 120 tablet, Rfl: 0 .  predniSONE (DELTASONE) 5 MG tablet, Take 5 mg by mouth daily with breakfast. take 1 1/2 (7.5mg ) qd, Disp: , Rfl:  .  sildenafil (VIAGRA) 100 MG tablet, Take 1 tablet by mouth daily as needed., Disp: , Rfl: 12 .  testosterone cypionate (DEPOTESTOTERONE CYPIONATE) 200 MG/ML injection, Inject 100 mg into the muscle every 14 (fourteen) days. ,  Disp: , Rfl:  .  Vitamin D, Ergocalciferol, (DRISDOL) 50000 units CAPS capsule, Take 50,000 Units by mouth once a week., Disp: , Rfl: 2 .  hydroxychloroquine (PLAQUENIL) 200 MG tablet, Take 1 tablet by mouth daily., Disp: , Rfl: 3 .  [START ON 11/27/2019] Oxycodone HCl 10 MG TABS, Take 1 tablet (10 mg total) by mouth every 6 (six) hours as needed. Must last 30 days., Disp: 120 tablet, Rfl: 0 .  [START ON 12/27/2019] Oxycodone HCl 10 MG TABS, Take 1 tablet (10 mg total) by mouth every 6 (six) hours as needed. Must last 30 days., Disp: 120 tablet, Rfl: 0   Allergies  Allergen Reactions  . Borax Rash    Borax detergent     Family History  Problem Relation Age of Onset  . Osteoarthritis Mother   . Heart attack Father 69  . Hypertension Father   . Hypertension Sister      Social History   Socioeconomic History  . Marital status: Married    Spouse name: Stephaine  . Number of children: 1  . Years of education: Not on file  . Highest education level: Not on file  Occupational History  . Not on file  Tobacco Use  .  Smoking status: Former Smoker    Quit date: 11/30/1995    Years since quitting: 23.9  . Smokeless tobacco: Former Systems developer    Quit date: 11/30/1995  Substance and Sexual Activity  . Alcohol use: Yes    Alcohol/week: 0.0 standard drinks    Comment: rarely  . Drug use: No  . Sexual activity: Yes    Partners: Female  Other Topics Concern  . Not on file  Social History Narrative  . Not on file   Social Determinants of Health   Financial Resource Strain:   . Difficulty of Paying Living Expenses:   Food Insecurity:   . Worried About Charity fundraiser in the Last Year:   . Arboriculturist in the Last Year:   Transportation Needs:   . Film/video editor (Medical):   Marland Kitchen Lack of Transportation (Non-Medical):   Physical Activity:   . Days of Exercise per Week:   . Minutes of Exercise per Session:   Stress:   . Feeling of Stress :   Social Connections:   . Frequency of Communication with Friends and Family:   . Frequency of Social Gatherings with Friends and Family:   . Attends Religious Services:   . Active Member of Clubs or Organizations:   . Attends Archivist Meetings:   Marland Kitchen Marital Status:   Intimate Partner Violence:   . Fear of Current or Ex-Partner:   . Emotionally Abused:   Marland Kitchen Physically Abused:   . Sexually Abused:     Chart Review Today: I personally reviewed active problem list, medication list, allergies, family history, social history, health maintenance, notes from last encounter, lab results, imaging with the patient/caregiver today.   Review of Systems 10 Systems reviewed and are negative for acute change except as noted in the HPI.     Objective:   Vitals:   11/15/19 1151  BP: 136/88  Pulse: 79  Resp: 14  Temp: (!) 97.5 F (36.4 C)  SpO2: 95%  Weight: (!) 313 lb 8 oz (142.2 kg)  Height: 6\' 2"  (1.88 m)    Body mass index is 40.25 kg/m.  Physical Exam Vitals and nursing note reviewed.  Constitutional:      General: He is  not in acute  distress.    Appearance: Normal appearance. He is well-developed. He is obese. He is not ill-appearing, toxic-appearing or diaphoretic.     Interventions: Face mask in place.  HENT:     Head: Normocephalic and atraumatic.     Jaw: No trismus.     Right Ear: External ear normal.     Left Ear: External ear normal.     Mouth/Throat:     Mouth: Mucous membranes are moist.  Eyes:     General: Lids are normal. No scleral icterus.    Conjunctiva/sclera: Conjunctivae normal.     Pupils: Pupils are equal, round, and reactive to light.  Neck:     Trachea: Trachea and phonation normal. No tracheal deviation.  Cardiovascular:     Rate and Rhythm: Normal rate and regular rhythm.     Pulses: Normal pulses.          Radial pulses are 2+ on the right side and 2+ on the left side.       Posterior tibial pulses are 2+ on the right side and 2+ on the left side.     Heart sounds: Normal heart sounds. No murmur. No friction rub. No gallop.   Pulmonary:     Effort: Pulmonary effort is normal. No respiratory distress.     Breath sounds: Normal breath sounds. No stridor. No wheezing, rhonchi or rales.  Abdominal:     General: Bowel sounds are normal. There is no distension.     Palpations: Abdomen is soft.     Tenderness: There is no abdominal tenderness. There is no right CVA tenderness, left CVA tenderness, guarding or rebound.  Musculoskeletal:        General: Normal range of motion.     Cervical back: Normal range of motion and neck supple.     Right lower leg: No edema.     Left lower leg: No edema.  Skin:    General: Skin is warm and dry.     Capillary Refill: Capillary refill takes less than 2 seconds.     Coloration: Skin is not jaundiced.     Findings: No rash.     Nails: There is no clubbing.  Neurological:     Mental Status: He is alert.     Cranial Nerves: No dysarthria or facial asymmetry.     Sensory: No sensory deficit.     Motor: No tremor or abnormal muscle tone.      Coordination: Coordination normal.     Gait: Gait normal.  Psychiatric:        Mood and Affect: Mood normal.        Speech: Speech normal.        Behavior: Behavior normal. Behavior is cooperative.      Results for orders placed or performed in visit on 07/26/19  Lipid Profile  Result Value Ref Range   Cholesterol, Total 218 (H) 100 - 199 mg/dL   Triglycerides 213 (H) 0 - 149 mg/dL   HDL 38 (L) >39 mg/dL   VLDL Cholesterol Cal 39 5 - 40 mg/dL   LDL Chol Calc (NIH) 141 (H) 0 - 99 mg/dL   Chol/HDL Ratio 5.7 (H) 0.0 - 5.0 ratio  Basic Metabolic Panel (BMET)  Result Value Ref Range   Glucose 87 65 - 99 mg/dL   BUN 8 6 - 24 mg/dL   Creatinine, Ser 1.10 0.76 - 1.27 mg/dL   GFR calc non Af Amer 81 >59 mL/min/1.73  GFR calc Af Amer 94 >59 mL/min/1.73   BUN/Creatinine Ratio 7 (L) 9 - 20   Sodium 142 134 - 144 mmol/L   Potassium 4.3 3.5 - 5.2 mmol/L   Chloride 104 96 - 106 mmol/L   CO2 24 20 - 29 mmol/L   Calcium 9.5 8.7 - 10.2 mg/dL  Hepatic function panel  Result Value Ref Range   Total Protein 6.8 6.0 - 8.5 g/dL   Albumin 4.7 4.0 - 5.0 g/dL   Bilirubin Total 0.6 0.0 - 1.2 mg/dL   Bilirubin, Direct 0.13 0.00 - 0.40 mg/dL   Alkaline Phosphatase 54 39 - 117 IU/L   AST 18 0 - 40 IU/L   ALT 33 0 - 44 IU/L        Assessment & Plan:      ICD-10-CM   1. Gastroesophageal reflux disease, unspecified whether esophagitis present  K21.9    start PPI, diet and lifestyle for GERD, f/up GI or PCP if not improving in the next 1-2 months  2. Mixed dyslipidemia  99991111 COMPLETE METABOLIC PANEL WITH GFR    Lipid panel   high cholesterol, has been working on diet, would like to repeat labs today, gaining weight despite working on healthy diet  3. Abnormal urine odor  R82.90 Hemoglobin A1c    Microalbumin, urine    Urinalysis, Routine w reflex microscopic   Patient reports urine odor and darker color he is concerned for metabolic abnormalities?  screen UA and CMP today  4. Fatigue,  unspecified type  [id number] COMPLETE METABOLIC PANEL WITH GFR    Lipid panel    Hemoglobin A1c    CBC with Differential/Platelet    Microalbumin, urine    Urinalysis, Routine w reflex microscopic   likely due to his multiple endocrine/hormonal dx and lack of f/up, off meds, also on daily narcotics w/SE, screen labs, does need f/up endo or new referral  5. Hypothyroidism, unspecified type  E03.9    per endo - do not know last labs - will need records, or pt to f/up endo   6. Adrenal insufficiency (H[facility])  E27.40    per chart - pt unsure of dx, unable to see endo records, on meds consistent with, no hypotension, check electrolytes, other endocrine testing per specialist  7. Hypogonadism male  E29.1    don't believe pt is doing testosterone replacement right now?  would need to f/up endo - can refer to new - may be part of fatigue?  8. Medication side effect  T88.7XXA    Opioid pain management likely causing some of his GI side effects/slow GI movement, fullness bloating, encouraged to decrease dose as able  9. Unintentional weight change  [id number] COMPLETE METABOLIC PANEL WITH GFR    Lipid panel    Hemoglobin A1c    CBC with Differential/Platelet   Multiple metabolic and endocrine diagnoses and medications will check basic labs rule out A1c for urine and weight concerns  10. Class 3 severe obesity with body mass index (BMI) of 40.0 to 44.9 in adult, unspecified obesity type, unspecified whether serious comorbidity present ([place])  E66.01    Z68.41    increasing weight and fatigue, drug induced? secondary to multiple metabolic/endocrine dx?  cont diet and exercise efforts, f/up endo as well     Discussed with pt with his complex med hx and multiple concerns that I would be checking basic labs to make sure kidney and liver function are normal, suggest he start a PPI for his GI  symptoms minimize narcotic pain medicine as much as possible to minimize side effects, I encouraged him to research any  subspecialist that he may like to get a second opinion from or establish with for his endocrine diagnoses -does sound like he has treatment for an history of adrenal insufficiency but I am unable to see any records from endocrinology or any recent lab work.  I did review his office visits over the last year or so with his PCP and she has noted several times that patient needed to follow-up with his endocrinologist and PCP did not also manage or check on endocrine labs including TSH -they also requested records from Dr. Michiel Sites multiple times and I do not see this in the chart either.  Patient has been off some of his medication including his daily prednisone which is likely a big contributor towards his fatigue symptoms.  Daily prednisone could also cause some persistent gastritis I do feel like PPI would be therapeutic and indicated explained that it would take usually several weeks to improve his GI symptoms as far as pain with eating bloating reflux or concerned because of chronic narcotic use slow GI transit likely be a chronic problem encouraged him to push fluids continue to eat healthy foods you stool softeners.  Regarding his adrenal insufficiency we will check his electrolytes does not appear that he had a history of hypertension are abnormal electrolytes, he has been dealing with elevated blood pressure is even been seeing cardiology.  Right now he is on his prednisone and fludrocortisone -does seem to be there is a general lack of patient understanding or patient education regarding his diagnoses and medical management -for example does not seem that he is been compliant with medications or follow-up does not sound like he understands medication what they are treating or med adjustments needed when ill etc.  All the patient's worried about checking his ammonia he does not have any history of elevated LFTs, does not drink alcohol urine odor is strong and dark in color but he does state that his fluid  status changes rapidly sometimes very concentrated and sometimes he will have polyuria and lose a lot of fluids rapidly over a day or 2.  We will check basic labs and urine to check for blood sugar, A1c, urine ketones, anion gap, add on additional urine test for nephropathy.   I suspect that his urinary symptoms are more related to his endocrine diagnoses than to any organ dysfunction or new onset diabetes.     Delsa Grana, PA-C 11/15/19 12:04 PM

## 2019-11-15 ENCOUNTER — Encounter: Payer: Self-pay | Admitting: Family Medicine

## 2019-11-15 ENCOUNTER — Ambulatory Visit (INDEPENDENT_AMBULATORY_CARE_PROVIDER_SITE_OTHER): Payer: No Typology Code available for payment source | Admitting: Family Medicine

## 2019-11-15 ENCOUNTER — Other Ambulatory Visit: Payer: Self-pay

## 2019-11-15 VITALS — BP 136/88 | HR 79 | Temp 97.5°F | Resp 14 | Ht 74.0 in | Wt 313.5 lb

## 2019-11-15 DIAGNOSIS — E291 Testicular hypofunction: Secondary | ICD-10-CM

## 2019-11-15 DIAGNOSIS — E2740 Unspecified adrenocortical insufficiency: Secondary | ICD-10-CM

## 2019-11-15 DIAGNOSIS — R5383 Other fatigue: Secondary | ICD-10-CM | POA: Diagnosis not present

## 2019-11-15 DIAGNOSIS — R8290 Unspecified abnormal findings in urine: Secondary | ICD-10-CM

## 2019-11-15 DIAGNOSIS — T887XXA Unspecified adverse effect of drug or medicament, initial encounter: Secondary | ICD-10-CM

## 2019-11-15 DIAGNOSIS — E039 Hypothyroidism, unspecified: Secondary | ICD-10-CM

## 2019-11-15 DIAGNOSIS — Z6841 Body Mass Index (BMI) 40.0 and over, adult: Secondary | ICD-10-CM

## 2019-11-15 DIAGNOSIS — R6889 Other general symptoms and signs: Secondary | ICD-10-CM

## 2019-11-15 DIAGNOSIS — K219 Gastro-esophageal reflux disease without esophagitis: Secondary | ICD-10-CM

## 2019-11-15 DIAGNOSIS — E782 Mixed hyperlipidemia: Secondary | ICD-10-CM

## 2019-11-15 MED ORDER — PANTOPRAZOLE SODIUM 20 MG PO TBEC: 20.0000 mg | DELAYED_RELEASE_TABLET | ORAL | 1 refills | Status: DC

## 2019-11-15 NOTE — Patient Instructions (Signed)
Lets find you another specialist - see if we can find anyone in the surrounding health systems that is subspecialized in your conditions - send me some names

## 2019-11-16 LAB — COMPLETE METABOLIC PANEL WITH GFR
AG Ratio: 2 (calc) (ref 1.0–2.5)
ALT: 30 U/L (ref 9–46)
AST: 25 U/L (ref 10–40)
Albumin: 4.3 g/dL (ref 3.6–5.1)
Alkaline phosphatase (APISO): 38 U/L (ref 36–130)
BUN: 8 mg/dL (ref 7–25)
CO2: 26 mmol/L (ref 20–32)
Calcium: 9.2 mg/dL (ref 8.6–10.3)
Chloride: 105 mmol/L (ref 98–110)
Creat: 0.97 mg/dL (ref 0.60–1.35)
GFR, Est African American: 109 mL/min/{1.73_m2} (ref >=60)
GFR, Est Non African American: 94 mL/min/{1.73_m2} (ref >=60)
Globulin: 2.2 g/dL (calc) (ref 1.9–3.7)
Glucose, Bld: 100 mg/dL — ABNORMAL HIGH (ref 65–99)
Potassium: 4.5 mmol/L (ref 3.5–5.3)
Sodium: 139 mmol/L (ref 135–146)
Total Bilirubin: 0.5 mg/dL (ref 0.2–1.2)
Total Protein: 6.5 g/dL (ref 6.1–8.1)

## 2019-11-16 LAB — HEMOGLOBIN A1C
Hgb A1c MFr Bld: 5.2 % of total Hgb (ref <5.7)
Mean Plasma Glucose: 103 (calc)
eAG (mmol/L): 5.7 (calc)

## 2019-11-16 LAB — CBC WITH DIFFERENTIAL/PLATELET
Absolute Monocytes: 348 cells/uL (ref 200–950)
Basophils Absolute: 12 cells/uL (ref 0–200)
Basophils Relative: 0.2 %
Eosinophils Absolute: 58 cells/uL (ref 15–500)
Eosinophils Relative: 1 %
HCT: 43.5 % (ref 38.5–50.0)
Hemoglobin: 14.7 g/dL (ref 13.2–17.1)
Lymphs Abs: 1224 cells/uL (ref 850–3900)
MCH: 29 pg (ref 27.0–33.0)
MCHC: 33.8 g/dL (ref 32.0–36.0)
MCV: 85.8 fL (ref 80.0–100.0)
MPV: 9.7 fL (ref 7.5–12.5)
Monocytes Relative: 6 %
Neutro Abs: 4159 cells/uL (ref 1500–7800)
Neutrophils Relative %: 71.7 %
Platelets: 223 10*3/uL (ref 140–400)
RBC: 5.07 10*6/uL (ref 4.20–5.80)
RDW: 13.4 % (ref 11.0–15.0)
Total Lymphocyte: 21.1 %
WBC: 5.8 10*3/uL (ref 3.8–10.8)

## 2019-11-16 LAB — URINALYSIS, ROUTINE W REFLEX MICROSCOPIC
Bilirubin Urine: NEGATIVE
Glucose, UA: NEGATIVE
Hgb urine dipstick: NEGATIVE
Ketones, ur: NEGATIVE
Leukocytes,Ua: NEGATIVE
Nitrite: NEGATIVE
Protein, ur: NEGATIVE
Specific Gravity, Urine: 1.013 (ref 1.001–1.03)
pH: <=5 (ref 5.0–8.0)

## 2019-11-16 LAB — LIPID PANEL
Cholesterol: 217 mg/dL — ABNORMAL HIGH (ref <200)
HDL: 33 mg/dL — ABNORMAL LOW (ref >=40)
LDL Cholesterol (Calc): 151 mg/dL (calc) — ABNORMAL HIGH
Non-HDL Cholesterol (Calc): 184 mg/dL (calc) — ABNORMAL HIGH (ref <130)
Total CHOL/HDL Ratio: 6.6 (calc) — ABNORMAL HIGH (ref <5.0)
Triglycerides: 190 mg/dL — ABNORMAL HIGH (ref <150)

## 2019-11-16 LAB — MICROALBUMIN, URINE: Microalb, Ur: 0.2 mg/dL

## 2019-12-18 ENCOUNTER — Other Ambulatory Visit: Payer: Self-pay | Admitting: Pain Medicine

## 2020-01-08 ENCOUNTER — Other Ambulatory Visit: Payer: Self-pay | Admitting: Endocrinology

## 2020-01-17 ENCOUNTER — Telehealth: Payer: Self-pay | Admitting: Pain Medicine

## 2020-01-17 NOTE — Telephone Encounter (Signed)
Attempted to call patient, mailbox full.

## 2020-01-17 NOTE — Telephone Encounter (Signed)
Patient states he is out of Neurontin, can this be sent in he has med mgmt appt for next Wed ?

## 2020-01-19 ENCOUNTER — Ambulatory Visit: Payer: No Typology Code available for payment source | Admitting: Family Medicine

## 2020-01-23 ENCOUNTER — Other Ambulatory Visit: Payer: Self-pay | Admitting: Cardiovascular Disease

## 2020-01-23 NOTE — Progress Notes (Signed)
PROVIDER NOTE: Information contained herein reflects review and annotations entered in association with encounter. Interpretation of such information and data should be left to medically-trained personnel. Information provided to patient can be located elsewhere in the medical record under "Patient Instructions". Document created using STT-dictation technology, any transcriptional errors that may result from process are unintentional.    Patient: Dean Ford  Service Category: E/M  Provider: Gaspar Cola, MD  DOB: [birth year]  DOS: 01/24/2020  Specialty: Interventional Pain Management  MRN: [id number]  Setting: Ambulatory outpatient  PCP: Hubbard Hartshorn, FNP  Type: Established Patient    Referring Provider: Hubbard Hartshorn, FNP  Location: Office  Delivery: Face-to-face     HPI  Reason for encounter: [name], a 44 y.o. year old male, is here today for evaluation and management of his Chronic pain syndrome [G89.4]. [name] primary complain today is Back Pain (lower) Last encounter: Practice (01/17/2020). My last encounter with him was on 01/17/2020. Pertinent problems: [name] has Lumbar spondylosis; Lumbar facet syndrome (Bilateral) (R>L); Intermittent muscle cramps; Lumbar spine pain; Hip arthrosis; DDD (degenerative disc disease), lumbar; L4-L5 disc bulge; Ligamentum flavum hypertrophy (HCC); Bulging lumbar disc; Chronic sacroiliac joint pain (Bilateral) (R>L); Lumbar facet hypertrophy (Multilevel); Lumbosacral radiculopathy at S1 (right-sided); Chronic pain syndrome; Chronic low back pain (Primary Area of Pain) (Bilateral) (R>L); Spasm of paraspinal muscle; Lower extremity numbness (Bilateral) (R>L); Chronic lumbar radicular pain (Right: S1, Bilateral: L5) (Bilateral) (R>L); Chronic hip pain (Right); Osteoarthritis of hip (Right); 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 Pain Assessment: Severity of Chronic pain is reported as a 4 /10. Location: Back Lower/does not radiate. Onset: More than a month ago. Quality: Burning. Timing: Constant. Modifying factor(s): laying down. Vitals:  height is _0  (1.88 m) and weight is 300 lb (136.1 kg). His temporal temperature is 98.2 F (36.8 C). His blood pressure is 134/95 (abnormal) and his pulse is 62. His respiration is 18 and oxygen saturation is 98%.    The patient indicates doing well with the current medication regimen. No adverse reactions or side effects reported to the medications, with the exception of the gabapentin.  He indicates that he was able to titrated up to 1200 mg at bedtime, but then he ran out of medicine.  Once he ran out of medicine he realized that it was causing some side effects.  Namely he indicated that he was having very vivid nightmares, twitching of his eyelid, and some very mild panic attacks and anxiety.  However he indicates that he is used to dealing with those and he knows what to do.  He also mentioned that he found significant benefit with the gabapentin and therefore he would not mind continuing to take the Neurontin.  However, further questioning revealed that he has never done a trial of pregabalin (Lyrica).  Therefore, today we will go ahead and do a trial of Lyrica to see if he can get the same type of benefit without the side effects.  I will be contacting him when he reaches 150 mg/day so asked to evaluate the medication and determine if we need to keep on going up.  This plan was shared with the  patient who understood and agreed.  Pharmacotherapy Assessment   Analgesic: Oxycodone IR 10 mg.  1 tab PO q 6 hrs (40 mg/day of oxycodone) MME/day:40 mg/day.   Monitoring: [place] PMP: PDMP reviewed during this encounter.       Pharmacotherapy: No side-effects or adverse reactions reported. Compliance: No problems identified. Effectiveness: Clinically acceptable.  UDS:  Summary  Date Value Ref Range Status  10/20/2018 FINAL  Final    Comment:    ==================================================================== TOXASSURE SELECT 13 (MW) ==================================================================== Test                             Result       Flag       Units Drug Present and Declared for Prescription Verification   Tramadol                       >2513        EXPECTED   ng/mg creat   O-Desmethyltramadol            >2513        EXPECTED   ng/mg creat   N-Desmethyltramadol            1203         EXPECTED   ng/mg creat    Source of tramadol is a prescription medication.    O-desmethyltramadol and N-desmethyltramadol are expected    metabolites of tramadol. ==================================================================== Test                      Result    Flag   Units      Ref Range   Creatinine              199              mg/dL      >=20 ==================================================================== Declared Medications:  The flagging and interpretation on this report are based on the  following declared medications.  Unexpected results may arise from  inaccuracies in the declared medications.  **Note: The testing scope of this panel includes these medications:  Tramadol  **Note: The testing scope of this panel does not include following  reported medications:  Baclofen  Carvedilol  Fludrocortisone  Hydrocortisone  Hydroxychloroquine  Levothyroxine  Methocarbamol  Prednisolone  Sildenafil  Testosterone  Valsartan  Vitamin D2 (Ergocalciferol) ==================================================================== For clinical consultation, please call  [phone]. ====================================================================      ROS  Constitutional: Denies any fever or chills Gastrointestinal: No reported hemesis, hematochezia, vomiting, or acute GI distress Musculoskeletal: Denies any acute onset joint swelling, redness, loss of ROM, or weakness Neurological: No reported episodes of acute onset apraxia, aphasia, dysarthria, agnosia, amnesia, paralysis, loss of coordination, or loss of consciousness  Medication Review  Oxycodone HCl, Vitamin D (Ergocalciferol), baclofen, carvedilol, fludrocortisone, gabapentin, hydroxychloroquine, levothyroxine, olmesartan, pantoprazole, predniSONE, pregabalin, sildenafil, and testosterone cypionate  History Review  Allergy: Mr. Cothron is allergic to borax. Drug: Mr. Ruby  reports no history of drug use. Alcohol:  reports current alcohol use. Tobacco:  reports that he quit smoking about 24 years ago. He quit smokeless tobacco use about 24 years ago. Social: Mr. Cokley  reports that he quit smoking about 24 years ago. He quit smokeless tobacco use about 24 years ago. He reports current alcohol use. He reports that he does not use drugs. Medical:  has a past medical history of Acute lumbar radiculopathy (Right) (S1) (05/26/2016), Acute  myofascial pain (05/04/2017), Acute postoperative pain (06/22/2019), Adrenal insufficiency ([place]), Anxiety, Anxiety disorder (01/28/2015), Arthritis, Back pain, chronic (06/03/2015), Hypertension, Hypothyroid, Sleep apnea, and Thyroid disease. Surgical: Mr. Woolbright  has a past surgical history that includes Elbow surgery; Back surgery; Colonoscopy with propofol (N/A, 02/23/2019); and discectomy (N/A, 2017, 2018, 2019). Family: family history includes Heart attack (age of onset: 42) in his father; Hypertension in his father and sister; Osteoarthritis in his mother.  Laboratory Chemistry Profile   Renal Lab Results  Component Value Date   BUN 8 11/15/2019   CREATININE 0.97  81/15/7262   BCR NOT APPLICABLE 03/55/9741   GFRAA 109 11/15/2019   GFRNONAA 94 11/15/2019     Hepatic Lab Results  Component Value Date   AST 25 11/15/2019   ALT 30 11/15/2019   ALBUMIN 4.7 07/26/2019   ALKPHOS 54 07/26/2019     Electrolytes Lab Results  Component Value Date   NA 139 11/15/2019   K 4.5 11/15/2019   CL 105 11/15/2019   CALCIUM 9.2 11/15/2019   MG 2.1 12/19/2012     Bone No results found for: VD25OH, VD125OH2TOT, UL8453MI6, OE3212YQ8, 25OHVITD1, 25OHVITD2, 25OHVITD3, TESTOFREE, TESTOSTERONE   Inflammation (CRP: Acute Phase) (ESR: Chronic Phase) No results found for: CRP, ESRSEDRATE, LATICACIDVEN     Note: Above Lab results reviewed.  Recent Imaging Review  Fluoro (C-Arm) (<60 min) (No Report) Fluoro was used, but no Radiologist interpretation will be provided.  Please refer to "NOTES" tab for provider progress note. Note: Reviewed        Physical Exam  General appearance: Well nourished, well developed, and well hydrated. In no apparent acute distress Mental status: Alert, oriented x 3 (person, place, & time)       Respiratory: No evidence of acute respiratory distress Eyes: PERLA Vitals: BP (!) 134/95 (BP Location: Right Arm, Patient Position: Sitting, Cuff Size: Large)   Pulse 62   Temp 98.2 F (36.8 C) (Temporal)   Resp 18   Ht '6\' 2"'$  (1.88 m)   Wt 300 lb (136.1 kg)   SpO2 98%   BMI 38.52 kg/m  BMI: Estimated body mass index is 38.52 kg/m as calculated from the following:   Height as of this encounter: '6\' 2"'$  (1.88 m).   Weight as of this encounter: 300 lb (136.1 kg). Ideal: Ideal body weight: 82.2 kg (181 lb 3.5 oz) Adjusted ideal body weight: 103.8 kg (228 lb 11.7 oz)  Assessment   Status Diagnosis  Controlled Controlled Controlled 1. Chronic pain syndrome   2. Chronic low back pain (Primary Area of Pain) (Bilateral) (R>L)   3. Chronic lower extremity pain (Secondary Area of Pain) (Bilateral) (R>L)   4. Chronic lumbar radicular  pain (Right: S1, Bilateral: L5) (Bilateral) (R>L)   5. Pharmacologic therapy   6. Neurogenic pain      Updated Problems: No problems updated.  Plan of Care  Problem-specific:  No problem-specific Assessment & Plan notes found for this encounter.  Mr. DILLIN LOFGREN has a current medication list which includes the following long-term medication(s): baclofen, carvedilol, levothyroxine, oxycodone hcl, [START ON 02/25/2020] oxycodone hcl, [START ON 03/26/2020] oxycodone hcl, pantoprazole, pregabalin, testosterone cypionate, and olmesartan.  Pharmacotherapy (Medications Ordered): Meds ordered this encounter  Medications  . Oxycodone HCl 10 MG TABS    Sig: Take 1 tablet (10 mg total) by mouth every 6 (six) hours as needed. Must last 30 days.    Dispense:  120 tablet    Refill:  0    Chronic  Pain: STOP Act (Not applicable) Fill 1 day early if closed on refill date. Do not fill until: 01/26/2020. To last until: 02/25/2020. Avoid benzodiazepines within 8 hours of opioids  . Oxycodone HCl 10 MG TABS    Sig: Take 1 tablet (10 mg total) by mouth every 6 (six) hours as needed. Must last 30 days.    Dispense:  120 tablet    Refill:  0    Chronic Pain: STOP Act (Not applicable) Fill 1 day early if closed on refill date. Do not fill until: 02/25/2020. To last until: 03/26/2020. Avoid benzodiazepines within 8 hours of opioids  . Oxycodone HCl 10 MG TABS    Sig: Take 1 tablet (10 mg total) by mouth every 6 (six) hours as needed. Must last 30 days.    Dispense:  120 tablet    Refill:  0    Chronic Pain: STOP Act (Not applicable) Fill 1 day early if closed on refill date. Do not fill until: 03/26/2020. To last until: 04/25/2020. Avoid benzodiazepines within 8 hours of opioids  . pregabalin (LYRICA) 50 MG capsule    Sig: Take 1 capsule (50 mg total) by mouth daily for 14 days, THEN 1 capsule (50 mg total) 2 (two) times daily for 14 days, THEN 1 capsule (50 mg total) 3 (three) times daily for 14 days.    Dispense:   84 capsule    Refill:  0    Fill one day early if pharmacy is closed on scheduled refill date. May substitute for generic if available.   Orders:  Orders Placed This Encounter  Procedures  . ToxASSURE Select 13 (MW), Urine    Volume: 30 ml(s). Minimum 3 ml of urine is needed. Document temperature of fresh sample. Indications: Long term (current) use of opiate analgesic (N56.213)    Order Specific Question:   Release to patient    Answer:   Immediate   Follow-up plan:   Return in about 6 weeks (around 03/04/2020) for (VV), (MM) evaluation of Lyrica titration.      Interventional treatment options: Under consideration:  NOTE: PLAQUENIL ANTICOAGULATION  Possible implant pump trial    Therapeutic/palliative (PRN):  Palliative Caudal ESI  Palliative right L5-S1 LESI  Palliative rightlumbar facet block #6  Diagnostic left lumbar facet block #3  Palliative right lumbar facet RFA #2 (DO NOT REPEAT. NO RFA. No adequate ID stimulation. Procedure aborted.) Palliative right SI joint block #2  Palliative right IA hip joint injection Palliative Lumbar TPI/MNB #2     Recent Visits Date Type Provider Dept  01/24/20 Office Visit Milinda Pointer, MD [facility]  Showing recent visits within past 90 days and meeting all other requirements Future Appointments Date Type Provider Dept  03/04/20 Appointment Milinda Pointer, MD [facility]  Showing future appointments within next 90 days and meeting all other requirements  I discussed the assessment and treatment plan with the patient. The patient was provided an opportunity to ask questions and all were answered. The patient agreed with the plan and demonstrated an understanding of the instructions.  Patient advised to call back or seek an in-person evaluation if the symptoms or condition worsens.  Duration of encounter: 30 minutes.  Note by: Gaspar Cola, MD Date: 01/24/2020; Time: 4:44 PM

## 2020-01-24 ENCOUNTER — Ambulatory Visit: Payer: No Typology Code available for payment source | Attending: Pain Medicine | Admitting: Pain Medicine

## 2020-01-24 ENCOUNTER — Other Ambulatory Visit: Payer: Self-pay

## 2020-01-24 ENCOUNTER — Encounter: Payer: Self-pay | Admitting: Pain Medicine

## 2020-01-24 VITALS — BP 134/95 | HR 62 | Temp 98.2°F | Resp 18 | Ht 74.0 in | Wt 300.0 lb

## 2020-01-24 DIAGNOSIS — M5441 Lumbago with sciatica, right side: Secondary | ICD-10-CM | POA: Insufficient documentation

## 2020-01-24 DIAGNOSIS — G8929 Other chronic pain: Secondary | ICD-10-CM | POA: Diagnosis present

## 2020-01-24 DIAGNOSIS — M79604 Pain in right leg: Secondary | ICD-10-CM | POA: Diagnosis not present

## 2020-01-24 DIAGNOSIS — M5416 Radiculopathy, lumbar region: Secondary | ICD-10-CM | POA: Insufficient documentation

## 2020-01-24 DIAGNOSIS — M792 Neuralgia and neuritis, unspecified: Secondary | ICD-10-CM | POA: Insufficient documentation

## 2020-01-24 DIAGNOSIS — G894 Chronic pain syndrome: Secondary | ICD-10-CM | POA: Insufficient documentation

## 2020-01-24 DIAGNOSIS — M79605 Pain in left leg: Secondary | ICD-10-CM | POA: Diagnosis present

## 2020-01-24 DIAGNOSIS — Z79899 Other long term (current) drug therapy: Secondary | ICD-10-CM | POA: Insufficient documentation

## 2020-01-24 MED ORDER — OXYCODONE HCL 10 MG PO TABS: 10.0000 mg | ORAL_TABLET | Freq: Four times a day (QID) | ORAL | 0 refills | Status: DC | PRN

## 2020-01-24 MED ORDER — PREGABALIN 50 MG PO CAPS: ORAL_CAPSULE | ORAL | 0 refills | Status: DC

## 2020-01-24 NOTE — Progress Notes (Signed)
Nursing Pain Medication Assessment:  Safety precautions to be maintained throughout the outpatient stay will include: orient to surroundings, keep bed in low position, maintain call bell within reach at all times, provide assistance with transfer out of bed and ambulation.  Medication Inspection Compliance: Mr. Burston did not comply with our request to bring his pills to be counted. He was reminded that bringing the medication bottles, even when empty, is a requirement.  Medication: None brought in. Pill/Patch Count: None available to be counted. Bottle Appearance: No container available. Did not bring bottle(s) to appointment. Filled Date: N/A Last Medication intake:  Today

## 2020-01-24 NOTE — Patient Instructions (Signed)
Pregabalin capsules What is this medicine? PREGABALIN (pre GAB a lin) is used to treat nerve pain from diabetes, shingles, spinal cord injury, and fibromyalgia. It is also used to control seizures in epilepsy. This medicine may be used for other purposes; ask your health care provider or pharmacist if you have questions. COMMON BRAND NAME(S): Lyrica What should I tell my health care provider before I take this medicine? They need to know if you have any of these conditions:  heart disease  history of drug abuse or alcohol abuse problem  kidney disease  lung or breathing disease  suicidal thoughts, plans, or attempt; a previous suicide attempt by you or a family member  an unusual or allergic reaction to pregabalin, gabapentin, other medicines, foods, dyes, or preservatives  pregnant or trying to get pregnant  breast-feeding How should I use this medicine? Take this medicine by mouth with a glass of water. Follow the directions on the prescription label. You can take it with or without food. If it upsets your stomach, take it with food. Take your medicine at regular intervals. Do not take it more often than directed. Do not stop taking except on your doctor's advice. A special MedGuide will be given to you by the pharmacist with each prescription and refill. Be sure to read this information carefully each time. Talk to your pediatrician regarding the use of this medicine in children. While this drug may be prescribed for children as young as 1 month for selected conditions, precautions do apply. Overdosage: If you think you have taken too much of this medicine contact a poison control center or emergency room at once. NOTE: This medicine is only for you. Do not share this medicine with others. What if I miss a dose? If you miss a dose, take it as soon as you can. If it is almost time for your next dose, take only that dose. Do not take double or extra doses. What may interact with this  medicine? This medicine may interact with the following medications:  alcohol  antihistamines for allergy, cough, and cold  certain medicines for anxiety or sleep  certain medicines for depression like amitriptyline, fluoxetine, sertraline  certain medicines for diabetes  certain medicines for seizures like phenobarbital, primidone  general anesthetics like halothane, isoflurane, methoxyflurane, propofol  local anesthetics like lidocaine, pramoxine, tetracaine  medicines that relax muscles for surgery  narcotic medicines for pain  phenothiazines like chlorpromazine, mesoridazine, prochlorperazine, thioridazine This list may not describe all possible interactions. Give your health care provider a list of all the medicines, herbs, non-prescription drugs, or dietary supplements you use. Also tell them if you smoke, drink alcohol, or use illegal drugs. Some items may interact with your medicine. What should I watch for while using this medicine? Tell your doctor or healthcare professional if your symptoms do not start to get better or if they get worse. Visit your doctor or health care professional for regular checks on your progress. Do not stop taking except on your doctor's advice. You may develop a severe reaction. Your doctor will tell you how much medicine to take. Wear a medical identification bracelet or chain if you are taking this medicine for seizures, and carry a card that describes your disease and details of your medicine and dosage times. You may get drowsy or dizzy. Do not drive, use machinery, or do anything that needs mental alertness until you know how this medicine affects you. Do not stand or sit up quickly, especially if   you are an older patient. This reduces the risk of dizzy or fainting spells. Alcohol may interfere with the effect of this medicine. Avoid alcoholic drinks. If you have a heart condition, like congestive heart failure, and notice that you are retaining  water and have swelling in your hands or feet, contact your health care provider immediately. The use of this medicine may increase the chance of suicidal thoughts or actions. Pay special attention to how you are responding while on this medicine. Any worsening of mood, or thoughts of suicide or dying should be reported to your health care professional right away. This medicine has caused reduced sperm counts in some men. This may interfere with the ability to father a child. You should talk to your doctor or health care professional if you are concerned about your fertility. Women who become pregnant while using this medicine for seizures may enroll in the [place] Antiepileptic Drug Pregnancy Registry by calling 1-888-233-2334. This registry collects information about the safety of antiepileptic drug use during pregnancy. What side effects may I notice from receiving this medicine? Side effects that you should report to your doctor or health care professional as soon as possible:  allergic reactions like skin rash, itching or hives, swelling of the face, lips, or tongue  breathing problems  changes in vision  chest pain  confusion  jerking or unusual movements of any part of your body  loss of memory  muscle pain, tenderness, or weakness  suicidal thoughts or other mood changes  swelling of the ankles, feet, hands  unusual bruising or bleeding Side effects that usually do not require medical attention (report to your doctor or health care professional if they continue or are bothersome):  dizziness  drowsiness  dry mouth  headache  nausea  tremors  trouble sleeping  weight gain This list may not describe all possible side effects. Call your doctor for medical advice about side effects. You may report side effects to FDA at 1-800-FDA-1088. Where should I keep my medicine? Keep out of the reach of children. This medicine can be abused. Keep your medicine in a safe  place to protect it from theft. Do not share this medicine with anyone. Selling or giving away this medicine is dangerous and against the law. This medicine may cause accidental overdose and death if it taken by other adults, children, or pets. Mix any unused medicine with a substance like cat litter or coffee grounds. Then throw the medicine away in a sealed container like a sealed bag or a coffee can with a lid. Do not use the medicine after the expiration date. Store at room temperature between 15 and 30 degrees C (59 and 86 degrees F). NOTE: This sheet is a summary. It may not cover all possible information. If you have questions about this medicine, talk to your doctor, pharmacist, or health care provider.  2020 Elsevier/Gold Standard (2018-08-05 13:15:55)  

## 2020-01-25 MED ORDER — OLMESARTAN MEDOXOMIL 40 MG PO TABS: 40.0000 mg | ORAL_TABLET | Freq: Every day | ORAL | 5 refills | Status: DC

## 2020-02-01 LAB — TOXASSURE SELECT 13 (MW), URINE

## 2020-02-29 ENCOUNTER — Telehealth: Payer: Self-pay

## 2020-02-29 NOTE — Telephone Encounter (Signed)
LM for patient to call office asap for pre virtual appointment questions.

## 2020-03-01 ENCOUNTER — Encounter: Payer: Self-pay | Admitting: Pain Medicine

## 2020-03-03 NOTE — Progress Notes (Signed)
Patient: Dean Ford  Service Category: E/M  Provider: Gaspar Cola, MD  DOB: June 05, 1976  DOS: 03/04/2020  Location: Office  MRN: [id number]  Setting: Ambulatory outpatient  Referring Provider: Hubbard Hartshorn, FNP  Type: Established Patient  Specialty: Interventional Pain Management  PCP: Hubbard Hartshorn, FNP  Location: Remote location  Delivery: TeleHealth     Virtual Encounter - Pain Management PROVIDER NOTE: Information contained herein reflects review and annotations entered in association with encounter. Interpretation of such information and data should be left to medically-trained personnel. Information provided to patient can be located elsewhere in the medical record under "Patient Instructions". Document created using STT-dictation technology, any transcriptional errors that may result from process are unintentional.    Contact & Pharmacy Preferred: [phone] Home: [phone] (home) Mobile: [phone] (mobile) E-mail: [email or web address]  [place], [place] - [address] [place] [address] Phone: [phone] Fax: [phone]   Pre-screening  Dean Ford offered "in-person" vs "virtual" encounter. He indicated preferring virtual for this encounter.   Reason COVID-19*  Social distancing based on CDC and AMA recommendations.   I contacted Dean Ford on 03/04/2020 via telephone.      I clearly identified myself as Gaspar Cola, MD. I verified that I was speaking with the correct person using two identifiers (Name: Dean Ford, and date of birth: 01/22/1976).  Consent I sought verbal advanced consent from Dean Ford for virtual visit interactions. I informed Dean Ford of possible security and privacy concerns, risks, and limitations associated with providing "not-in-person" medical evaluation and management services. I also informed Dean Ford of the availability of "in-person" appointments. Finally, I  informed him that there would be a charge for the virtual visit and that he could be  personally, fully or partially, financially responsible for it. Dean Ford expressed understanding and agreed to proceed.   Historic Elements   Dean Ford is a 45 y.o. year old, male patient evaluated today after his last contact with our practice on 02/29/2020. Dean Ford  has a past medical history of Acute lumbar radiculopathy (Right) (S1) (05/26/2016), Acute myofascial pain (05/04/2017), Acute postoperative pain (06/22/2019), Adrenal insufficiency ([place]), Anxiety, Anxiety disorder (01/28/2015), Arthritis, Back pain, chronic (06/03/2015), Hypertension, Hypothyroid, Sleep apnea, and Thyroid disease. He also  has a past surgical history that includes Elbow surgery; Back surgery; Colonoscopy with propofol (N/A, 02/23/2019); and discectomy (N/A, 2017, 2018, 2019). Dean Ford has a current medication list which includes the following prescription(s): baclofen, carvedilol, fludrocortisone, hydroxychloroquine, levothyroxine, olmesartan, oxycodone hcl, [START ON 03/26/2020] oxycodone hcl, pantoprazole, prednisone, sildenafil, testosterone cypionate, vitamin d (ergocalciferol), and gabapentin. He  reports that he quit smoking about 24 years ago. He quit smokeless tobacco use about 24 years ago. He reports current alcohol use. He reports that he does not use drugs. Dean Ford is allergic to borax.   HPI  Today, he is being contacted for medication management.  The purpose of today's encounter is to review and adjust the pregabalin (Lyrica) titration.  By now the patient should be taking 50 mg p.o. 3 times daily (150 mg/day).  The patient indicates that the Lyrica acted in a similar manner to the high-dose gabapentin, giving him panic attacks.  Because of this he has stopped it.  He was wondering if he could go back to a lower dose gabapentin to see if he could tolerate that better.  We will go ahead and do a 30-day  trial of medicine we will  go from there.  We will officially discontinue his Lyrica due to the panic attacks.  We will start him on gabapentin 100 mg p.o. at bedtime and increase it slowly as tolerated.  He was instructed to stay on that dose which does not give him any side effects.  Pharmacotherapy Assessment  Analgesic: Oxycodone IR 10 mg. 1 tab PO q 6 hrs (40 mg/day of oxycodone) MME/day:40 mg/day.   Monitoring: [place] PMP: PDMP reviewed during this encounter.       Pharmacotherapy: No side-effects or adverse reactions reported. Compliance: No problems identified. Effectiveness: Clinically acceptable. Plan: Refer to "POC".  UDS:  Summary  Date Value Ref Range Status  01/24/2020 Note  Corrected    Comment:    ==================================================================== [employer] Select 13 (MW) ==================================================================== Test                             Result       Flag       Units  Drug Present and Declared for Prescription Verification   Oxycodone                      1440         EXPECTED   ng/mg creat   Oxymorphone                    3606         EXPECTED   ng/mg creat   Noroxycodone                   1806         EXPECTED   ng/mg creat   Noroxymorphone                 674          EXPECTED   ng/mg creat    Sources of oxycodone are scheduled prescription medications.    Oxymorphone, noroxycodone, and noroxymorphone are expected    metabolites of oxycodone. Oxymorphone is also available as a    scheduled prescription medication.  Drug Present not Declared for Prescription Verification   Carboxy-THC                    104          UNEXPECTED ng/mg creat    Carboxy-THC is a metabolite of tetrahydrocannabinol (THC). Source of    THC is most commonly herbal marijuana or marijuana-based products,    but THC is also present in a scheduled prescription medication.    Trace amounts of THC can be present in hemp and cannabidiol (CBD)    products. This test is  not intended to distinguish between delta-9-    tetrahydrocannabinol, the predominant form of THC in most herbal or    marijuana-based products, and delta-8-tetrahydrocannabinol.  ==================================================================== Test                      Result    Flag   Units      Ref Range   Creatinine              47               mg/dL      >=20 ==================================================================== Declared Medications:  The flagging and interpretation on this report are based on the  following declared medications.  Unexpected results may  arise from  inaccuracies in the declared medications.   **Note: The testing scope of this panel includes these medications:   Oxycodone   **Note: The testing scope of this panel does not include the  following reported medications:   Baclofen (Lioresal)  Carvedilol (Coreg)  Fludrocortisone (Florinef)  Gabapentin (Neurontin)  Hydroxychloroquine (Plaquenil)  Levothyroxine (Synthroid)  Olmesartan (Benicar)  Pantoprazole (Protonix)  Prednisone (Deltasone)  Pregabalin (Lyrica)  Sildenafil (Viagra)  Testosterone  Vitamin D2 (Drisdol) ==================================================================== For clinical consultation, please call [phone]. ====================================================================     Laboratory Chemistry Profile   Renal Lab Results  Component Value Date   BUN 8 11/15/2019   CREATININE 0.97 09/81/1914   BCR NOT APPLICABLE 78/29/5621   GFRAA 109 11/15/2019   GFRNONAA 94 11/15/2019     Hepatic Lab Results  Component Value Date   AST 25 11/15/2019   ALT 30 11/15/2019   ALBUMIN 4.7 07/26/2019   ALKPHOS 54 07/26/2019     Electrolytes Lab Results  Component Value Date   NA 139 11/15/2019   K 4.5 11/15/2019   CL 105 11/15/2019   CALCIUM 9.2 11/15/2019   MG 2.1 12/19/2012     Bone No results found for: VD25OH, VD125OH2TOT, HY8657QI6, NG2952WU1,  25OHVITD1, 25OHVITD2, 25OHVITD3, TESTOFREE, TESTOSTERONE   Inflammation (CRP: Acute Phase) (ESR: Chronic Phase) No results found for: CRP, ESRSEDRATE, LATICACIDVEN     Note: Above Lab results reviewed.   Imaging  Fluoro (C-Arm) (<60 min) (No Report) Fluoro was used, but no Radiologist interpretation will be provided.  Please refer to "NOTES" tab for provider progress note.  Assessment  The primary encounter diagnosis was Chronic pain syndrome. Diagnoses of Chronic low back pain (Primary Area of Pain) (Bilateral) (R>L), Chronic lower extremity pain (Secondary Area of Pain) (Bilateral) (R>L), and Neurogenic pain were also pertinent to this visit.  Plan of Care  Problem-specific:  No problem-specific Assessment & Plan notes found for this encounter.  Mr. ZAHIR EISENHOUR has a current medication list which includes the following long-term medication(s): baclofen, carvedilol, levothyroxine, olmesartan, oxycodone hcl, [START ON 03/26/2020] oxycodone hcl, pantoprazole, testosterone cypionate, and gabapentin.  Pharmacotherapy (Medications Ordered): Meds ordered this encounter  Medications  . gabapentin (NEURONTIN) 100 MG capsule    Sig: Take 1 capsule (100 mg total) by mouth at bedtime for 14 days, THEN 2 capsules (200 mg total) at bedtime for 14 days, THEN 3 capsules (300 mg total) at bedtime for 14 days. Follow written titration schedule.    Dispense:  84 capsule    Refill:  0    Fill one day early if pharmacy is closed on scheduled refill date. May substitute for generic if available.   Orders:  No orders of the defined types were placed in this encounter.  Follow-up plan:   Return in about 1 month (around 04/04/2020) for VV(15-min), MM (never on procedure day) to evaluate gabapentin titration & UDS results (+) THC.      Interventional treatment options: Under consideration:  NOTE: PLAQUENIL ANTICOAGULATION  Possible implant pump trial    Therapeutic/palliative (PRN):  Palliative  Caudal ESI  Palliative right L5-S1 LESI  Palliative rightlumbar facet block #6  Diagnostic left lumbar facet block #3  Palliative right lumbar facet RFA #2 (DO NOT REPEAT. NO RFA. No adequate ID stimulation. Procedure aborted.) Palliative right SI joint block #2  Palliative right IA hip joint injection Palliative Lumbar TPI/MNB #2      Recent Visits Date Type Provider Dept  01/24/20 Office Visit Milinda Pointer, MD  [facility]  Showing recent visits within past 90 days and meeting all other requirements Today's Visits Date Type Provider Dept  03/04/20 Telemedicine Milinda Pointer, MD [facility]  Showing today's visits and meeting all other requirements Future Appointments No visits were found meeting these conditions. Showing future appointments within next 90 days and meeting all other requirements  I discussed the assessment and treatment plan with the patient. The patient was provided an opportunity to ask questions and all were answered. The patient agreed with the plan and demonstrated an understanding of the instructions.  Patient advised to call back or seek an in-person evaluation if the symptoms or condition worsens.  Duration of encounter: 15 minutes.  Note by: Gaspar Cola, MD Date: 03/04/2020; Time: 10:13 AM

## 2020-03-04 ENCOUNTER — Other Ambulatory Visit: Payer: Self-pay

## 2020-03-04 ENCOUNTER — Ambulatory Visit: Payer: No Typology Code available for payment source | Attending: Pain Medicine | Admitting: Pain Medicine

## 2020-03-04 DIAGNOSIS — M5441 Lumbago with sciatica, right side: Secondary | ICD-10-CM

## 2020-03-04 DIAGNOSIS — G894 Chronic pain syndrome: Secondary | ICD-10-CM | POA: Diagnosis not present

## 2020-03-04 DIAGNOSIS — M792 Neuralgia and neuritis, unspecified: Secondary | ICD-10-CM

## 2020-03-04 DIAGNOSIS — M79604 Pain in right leg: Secondary | ICD-10-CM

## 2020-03-04 DIAGNOSIS — M79605 Pain in left leg: Secondary | ICD-10-CM

## 2020-03-04 DIAGNOSIS — G8929 Other chronic pain: Secondary | ICD-10-CM

## 2020-03-04 MED ORDER — GABAPENTIN 100 MG PO CAPS: ORAL_CAPSULE | ORAL | 0 refills | Status: DC

## 2020-03-04 NOTE — Patient Instructions (Signed)
____________________________________________________________________________________________  Medication Rules  Purpose: To inform patients, and their family members, of our rules and regulations.  Applies to: All patients receiving prescriptions (written or electronic).  Pharmacy of record: Pharmacy where electronic prescriptions will be sent. If written prescriptions are taken to a different pharmacy, please inform the nursing staff. The pharmacy listed in the electronic medical record should be the one where you would like electronic prescriptions to be sent.  Electronic prescriptions: In compliance with the [place] Strengthen Opioid Misuse Prevention (STOP) Act of 2017 (Session Law 2017-74/H243), effective August 17, 2018, all controlled substances must be electronically prescribed. Calling prescriptions to the pharmacy will cease to exist.  Prescription refills: Only during scheduled appointments. Applies to all prescriptions.  NOTE: The following applies primarily to controlled substances (Opioid* Pain Medications).   Type of encounter (visit): For patients receiving controlled substances, face-to-face visits are required. (Not an option or up to the patient.)  Patient's responsibilities: 1. Pain Pills: Bring all pain pills to every appointment (except for procedure appointments). 2. Pill Bottles: Bring pills in original pharmacy bottle. Always bring the newest bottle. Bring bottle, even if empty. 3. Medication refills: You are responsible for knowing and keeping track of what medications you take and those you need refilled. The day before your appointment: write a list of all prescriptions that need to be refilled. The day of the appointment: give the list to the admitting nurse. Prescriptions will be written only during appointments. No prescriptions will be written on procedure days. If you forget a medication: it will not be "Called in", "Faxed", or "electronically sent".  You will need to get another appointment to get these prescribed. No early refills. Do not call asking to have your prescription filled early. 4. Prescription Accuracy: You are responsible for carefully inspecting your prescriptions before leaving our office. Have the discharge nurse carefully go over each prescription with you, before taking them home. Make sure that your name is accurately spelled, that your address is correct. Check the name and dose of your medication to make sure it is accurate. Check the number of pills, and the written instructions to make sure they are clear and accurate. Make sure that you are given enough medication to last until your next medication refill appointment. 5. Taking Medication: Take medication as prescribed. When it comes to controlled substances, taking less pills or less frequently than prescribed is permitted and encouraged. Never take more pills than instructed. Never take medication more frequently than prescribed.  6. Inform other Doctors: Always inform, all of your healthcare providers, of all the medications you take. 7. Pain Medication from other Providers: You are not allowed to accept any additional pain medication from any other Doctor or Healthcare provider. There are two exceptions to this rule. (see below) In the event that you require additional pain medication, you are responsible for notifying us, as stated below. 8. Medication Agreement: You are responsible for carefully reading and following our Medication Agreement. This must be signed before receiving any prescriptions from our practice. Safely store a copy of your signed Agreement. Violations to the Agreement will result in no further prescriptions. (Additional copies of our Medication Agreement are available upon request.) 9. Laws, Rules, & Regulations: All patients are expected to follow all Federal and State Laws, Statutes, Rules, & Regulations. Ignorance of the Laws does not constitute a  valid excuse.  10. Illegal drugs and Controlled Substances: The use of illegal substances (including, but not limited to marijuana and its   derivatives) and/or the illegal use of any controlled substances is strictly prohibited. Violation of this rule may result in the immediate and permanent discontinuation of any and all prescriptions being written by our practice. The use of any illegal substances is prohibited. 11. Adopted CDC guidelines & recommendations: Target dosing levels will be at or below 60 MME/day. Use of benzodiazepines** is not recommended.  Exceptions: There are only two exceptions to the rule of not receiving pain medications from other Healthcare Providers. 1. Exception #1 (Emergencies): In the event of an emergency (i.e.: accident requiring emergency care), you are allowed to receive additional pain medication. However, you are responsible for: As soon as you are able, call our office [phone], at any time of the day or night, and leave a message stating your name, the date and nature of the emergency, and the name and dose of the medication prescribed. In the event that your call is answered by a member of our staff, make sure to document and save the date, time, and the name of the person that took your information.  2. Exception #2 (Planned Surgery): In the event that you are scheduled by another doctor or dentist to have any type of surgery or procedure, you are allowed (for a period no longer than 30 days), to receive additional pain medication, for the acute post-op pain. However, in this case, you are responsible for picking up a copy of our "Post-op Pain Management for Surgeons" handout, and giving it to your surgeon or dentist. This document is available at our office, and does not require an appointment to obtain it. Simply go to our office during business hours (Monday-Thursday from 8:00 AM to 4:00 PM) (Friday 8:00 AM to 12:00 Noon) or if you have a scheduled appointment  with us, prior to your surgery, and ask for it by name. In addition, you will need to provide us with your name, name of your surgeon, type of surgery, and date of procedure or surgery.  *Opioid medications include: morphine, codeine, oxycodone, oxymorphone, hydrocodone, hydromorphone, meperidine, tramadol, tapentadol, buprenorphine, fentanyl, methadone. **Benzodiazepine medications include: diazepam (Valium), alprazolam (Xanax), clonazepam (Klonopine), lorazepam (Ativan), clorazepate (Tranxene), chlordiazepoxide (Librium), estazolam (Prosom), oxazepam (Serax), temazepam (Restoril), triazolam (Halcion) (Last updated: 10/14/2017) ____________________________________________________________________________________________    

## 2020-04-02 ENCOUNTER — Ambulatory Visit
Payer: No Typology Code available for payment source | Attending: Pain Medicine | Admitting: Student in an Organized Health Care Education/Training Program

## 2020-04-02 ENCOUNTER — Encounter: Payer: Self-pay | Admitting: Student in an Organized Health Care Education/Training Program

## 2020-04-02 ENCOUNTER — Other Ambulatory Visit: Payer: Self-pay | Admitting: Endocrinology

## 2020-04-02 ENCOUNTER — Other Ambulatory Visit: Payer: Self-pay

## 2020-04-02 DIAGNOSIS — M5416 Radiculopathy, lumbar region: Secondary | ICD-10-CM | POA: Diagnosis not present

## 2020-04-02 DIAGNOSIS — G894 Chronic pain syndrome: Secondary | ICD-10-CM | POA: Diagnosis not present

## 2020-04-02 DIAGNOSIS — G96198 Other disorders of meninges, not elsewhere classified: Secondary | ICD-10-CM

## 2020-04-02 DIAGNOSIS — M47816 Spondylosis without myelopathy or radiculopathy, lumbar region: Secondary | ICD-10-CM

## 2020-04-02 DIAGNOSIS — M961 Postlaminectomy syndrome, not elsewhere classified: Secondary | ICD-10-CM

## 2020-04-02 DIAGNOSIS — M792 Neuralgia and neuritis, unspecified: Secondary | ICD-10-CM

## 2020-04-02 DIAGNOSIS — M7918 Myalgia, other site: Secondary | ICD-10-CM

## 2020-04-02 DIAGNOSIS — M5441 Lumbago with sciatica, right side: Secondary | ICD-10-CM | POA: Diagnosis not present

## 2020-04-02 DIAGNOSIS — M47817 Spondylosis without myelopathy or radiculopathy, lumbosacral region: Secondary | ICD-10-CM

## 2020-04-02 DIAGNOSIS — G8929 Other chronic pain: Secondary | ICD-10-CM

## 2020-04-02 MED ORDER — OXYCODONE HCL 10 MG PO TABS: 10.0000 mg | ORAL_TABLET | Freq: Four times a day (QID) | ORAL | 0 refills | Status: DC | PRN

## 2020-04-02 MED ORDER — GABAPENTIN 300 MG PO CAPS: 300.0000 mg | ORAL_CAPSULE | Freq: Two times a day (BID) | ORAL | 2 refills | Status: DC

## 2020-04-02 NOTE — Progress Notes (Signed)
Patient: Dean Ford  Service Category: E/M  Provider: Bilal [name], MD  DOB: 02/13/1976  DOS: 04/02/2020  Location: Office  MRN: [id number]  Setting: Ambulatory outpatient  Referring Provider: Boyce, Emily E, FNP  Type: Established Patient  Specialty: Interventional Pain Management  PCP: Boyce, Emily E, FNP  Location: Home  Delivery: TeleHealth     Virtual Encounter - Pain Management PROVIDER NOTE: Information contained herein reflects review and annotations entered in association with encounter. Interpretation of such information and data should be left to medically-trained personnel. Information provided to patient can be located elsewhere in the medical record under "Patient Instructions". Document created using STT-dictation technology, any transcriptional errors that may result from process are unintentional.    Contact & Pharmacy Preferred: [phone] Home: [phone] (home) Mobile: [phone] (mobile) E-mail: [email or web address]  [facility] - [place], [place] - [address] [place] [address] Phone: [phone] Fax: [phone]   Pre-screening  Dean Ford offered "in-person" vs "virtual" encounter. He indicated preferring virtual for this encounter.   Reason COVID-19*  Social distancing based on CDC and AMA recommendations.   I contacted Dean Ford on 04/02/2020 via video conference.      I clearly identified myself as Bilal [name], MD. I verified that I was speaking with the correct person using two identifiers (Name: Dean Ford, and date of birth: 03/24/1976).  Consent I sought verbal advanced consent from Dean Ford for virtual visit interactions. I informed Dean Ford of possible security and privacy concerns, risks, and limitations associated with providing "not-in-person" medical evaluation and management services. I also informed Dean Ford of the availability of "in-person" appointments. Finally, I informed him that  there would be a charge for the virtual visit and that he could be  personally, fully or partially, financially responsible for it. Mr. Wik expressed understanding and agreed to proceed.   Historic Elements   Dean Ford is a 44 y.o. year old, male patient evaluated today after his last contact with our practice on 02/29/2020. Dean Ford  has a past medical history of Acute lumbar radiculopathy (Right) (S1) (05/26/2016), Acute myofascial pain (05/04/2017), Acute postoperative pain (06/22/2019), Adrenal insufficiency (HCC), Anxiety, Anxiety disorder (01/28/2015), Arthritis, Back pain, chronic (06/03/2015), Hypertension, Hypothyroid, Sleep apnea, and Thyroid disease. He also  has a past surgical history that includes Elbow surgery; Back surgery; Colonoscopy with propofol (N/A, 02/23/2019); and discectomy (N/A, 2017, 2018, 2019). Dean Ford has a current medication list which includes the following prescription(s): baclofen, carvedilol, fludrocortisone, gabapentin, hydroxychloroquine, levothyroxine, olmesartan, [START ON 04/25/2020] oxycodone hcl, pantoprazole, prednisone, sildenafil, testosterone cypionate, and vitamin d (ergocalciferol). He  reports that he quit smoking about 24 years ago. He quit smokeless tobacco use about 24 years ago. He reports current alcohol use. He reports that he does not use drugs. Dean Ford is allergic to borax.   HPI  Today, he is being contacted for medication management.   Medication management for Dr. Naveira as he is taking time off for a short period of time.  Disclose this to the patient. Patient was able to titrate his gabapentin up to 300 mg at night.  Is finding benefit with this medication.  No side effects.  Discussed increasing to 300 mg twice daily or 600 mg nightly which patient will self regulate. Discussed urine toxicology results which were positive for THC.  Patient states that he did ingest CBD.  He has never had an inappropriate urine toxicology screen.    Given that  this is my first time evaluating this patient and he does have a longstanding history at this pain clinic since 2012, recommend patient repeat urine toxicology screen which should hopefully be negative for THC.  I will prescribe prescription for 1 month to be filled on 04/25/2020.  I will have patient follow-up with Dr. Dossie Arbour thereafter to discuss his urine toxicology results.  Patient endorsed understanding.  Pharmacotherapy Assessment  Analgesic: 03/26/2020  1   01/24/2020  Oxycodone Hcl 10 MG Tablet  120.00  30 Fr Nav   [id number]   Arm (6112)   0/0  60.00 MME  Comm Ins   [place]     Monitoring: [place] PMP: PDMP reviewed during this encounter.       Pharmacotherapy: No side-effects or adverse reactions reported. Compliance: No problems identified. Effectiveness: Clinically acceptable. Plan: Refer to "POC".  UDS:  Summary  Date Value Ref Range Status  01/24/2020 Note  Corrected    Comment:    ==================================================================== ToxASSURE Select 13 (MW) ==================================================================== Test                             Result       Flag       Units  Drug Present and Declared for Prescription Verification   Oxycodone                      1440         EXPECTED   ng/mg creat   Oxymorphone                    3606         EXPECTED   ng/mg creat   Noroxycodone                   1806         EXPECTED   ng/mg creat   Noroxymorphone                 674          EXPECTED   ng/mg creat    Sources of oxycodone are scheduled prescription medications.    Oxymorphone, noroxycodone, and noroxymorphone are expected    metabolites of oxycodone. Oxymorphone is also available as a    scheduled prescription medication.  Drug Present not Declared for Prescription Verification   Carboxy-THC                    104          UNEXPECTED ng/mg creat    Carboxy-THC is a metabolite of tetrahydrocannabinol (THC). Source of    THC is most commonly herbal  marijuana or marijuana-based products,    but THC is also present in a scheduled prescription medication.    Trace amounts of THC can be present in hemp and cannabidiol (CBD)    products. This test is not intended to distinguish between delta-9-    tetrahydrocannabinol, the predominant form of THC in most herbal or    marijuana-based products, and delta-8-tetrahydrocannabinol.  ==================================================================== Test                      Result    Flag   Units      Ref Range   Creatinine              47  mg/dL      >=20 ==================================================================== Declared Medications:  The flagging and interpretation on this report are based on the  following declared medications.  Unexpected results may arise from  inaccuracies in the declared medications.   **Note: The testing scope of this panel includes these medications:   Oxycodone   **Note: The testing scope of this panel does not include the  following reported medications:   Baclofen (Lioresal)  Carvedilol (Coreg)  Fludrocortisone (Florinef)  Gabapentin (Neurontin)  Hydroxychloroquine (Plaquenil)  Levothyroxine (Synthroid)  Olmesartan (Benicar)  Pantoprazole (Protonix)  Prednisone (Deltasone)  Pregabalin (Lyrica)  Sildenafil (Viagra)  Testosterone  Vitamin D2 (Drisdol) ==================================================================== For clinical consultation, please call [phone]. ====================================================================     Laboratory Chemistry Profile   Renal Lab Results  Component Value Date   BUN 8 11/15/2019   CREATININE 0.97 82/42/3536   BCR NOT APPLICABLE 14/43/1540   GFRAA 109 11/15/2019   GFRNONAA 94 11/15/2019     Hepatic Lab Results  Component Value Date   AST 25 11/15/2019   ALT 30 11/15/2019   ALBUMIN 4.7 07/26/2019   ALKPHOS 54 07/26/2019     Electrolytes Lab Results   Component Value Date   NA 139 11/15/2019   K 4.5 11/15/2019   CL 105 11/15/2019   CALCIUM 9.2 11/15/2019   MG 2.1 12/19/2012     Bone No results found for: VD25OH, VD125OH2TOT, GQ6761PJ0, DT2671IW5, 25OHVITD1, 25OHVITD2, 25OHVITD3, TESTOFREE, TESTOSTERONE   Inflammation (CRP: Acute Phase) (ESR: Chronic Phase) No results found for: CRP, ESRSEDRATE, LATICACIDVEN     Note: Above Lab results reviewed.   Assessment  The primary encounter diagnosis was Chronic radicular lumbar pain. 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  Plan of Care  Dean Ford has a current medication list which includes the following long-term medication(s): baclofen, carvedilol, gabapentin, levothyroxine, olmesartan, [START ON 04/25/2020] oxycodone hcl, pantoprazole, and testosterone cypionate.  Inappropriate urine toxicology screen for THC.  Patient was honest and endorsed that he did utilize CBD.  Patient instructed to come into clinic to repeat urine toxicology screen.  Given that he has a long-term relationship with this clinic and with Dr. Dossie Arbour and has never had an inappropriate urine toxicology screen and since I am providing care to this patient for the first time, we will have him follow-up with Dr. Dossie Arbour next month to discuss urine toxicology screen results.  I have informed him that utilization of THC is a violation of his pain contract and that he should avoid intake of this medication if he is prescribed chronic opioid analgesics.  In regards to gabapentin, he is endorsing analgesic benefit at titration up to 300 mg  nightly.  No side effects.  We will have him increase to 300 mg twice daily.  Pharmacotherapy (Medications Ordered): Meds ordered this encounter  Medications  . DISCONTD: Oxycodone HCl 10 MG TABS    Sig: Take 1 tablet (10 mg total) by mouth every 6 (six) hours as needed. Must last 30 days.    Dispense:  120 tablet    Refill:  0    Chronic Pain: STOP Act (Not applicable) Fill 1 day early if closed on refill date. Do not fill until: 03/26/2020.  To last until: 04/25/2020. Avoid benzodiazepines within 8 hours of opioids  . gabapentin (NEURONTIN) 300 MG capsule    Sig: Take 1 capsule (300 mg total) by mouth 2 (two) times daily.    Dispense:  60 capsule    Refill:  2  . Oxycodone HCl 10 MG TABS    Sig: Take 1 tablet (10 mg total) by mouth every 6 (six) hours as needed. Must last 30 days.    Dispense:  120 tablet    Refill:  0    Chronic Pain: STOP Act (Not applicable) Fill 1 day early if closed on refill date.   Orders:  Orders Placed This Encounter  Procedures  . ToxASSURE Select 13 (MW), Urine    Volume: 30 ml(s). Minimum 3 ml of urine is needed. Document temperature of fresh sample. Indications: Long term (current) use of opiate analgesic (Z79.891)    Order Specific Question:   Release to patient    Answer:   Immediate   Follow-up plan:   Return in about 7 weeks (around 05/23/2020) for Medication Management (Dr Naveira).   Recent Visits Date Type Provider Dept  03/04/20 Telemedicine Naveira, Francisco, MD [facility]  01/24/20 Office Visit Naveira, Francisco, MD [facility]  Showing recent visits within past 90 days and meeting all other requirements Today's Visits Date Type Provider Dept  04/02/20 Telemedicine [name], Bilal, MD [facility]  Showing today's visits and meeting all other requirements Future Appointments No visits were found meeting these conditions. Showing future appointments within next 90 days and meeting all other requirements  I  discussed the assessment and treatment plan with the patient. The patient was provided an opportunity to ask questions and all were answered. The patient agreed with the plan and demonstrated an understanding of the instructions.  Patient advised to call back or seek an in-person evaluation if the symptoms or condition worsens.  Duration of encounter: 30 minutes.  Note by: Bilal [name], MD Date: 04/02/2020; Time: 3:54 PM 

## 2020-04-03 ENCOUNTER — Telehealth: Payer: No Typology Code available for payment source | Admitting: Pain Medicine

## 2020-04-03 ENCOUNTER — Telehealth
Payer: No Typology Code available for payment source | Admitting: Student in an Organized Health Care Education/Training Program

## 2020-04-03 NOTE — Progress Notes (Signed)
Dean Ford agreed to to this.

## 2020-04-24 ENCOUNTER — Other Ambulatory Visit: Payer: Self-pay | Admitting: Rheumatology

## 2020-04-25 LAB — TOXASSURE SELECT 13 (MW), URINE

## 2020-04-26 ENCOUNTER — Other Ambulatory Visit: Payer: Self-pay | Admitting: Pain Medicine

## 2020-04-26 DIAGNOSIS — M7918 Myalgia, other site: Secondary | ICD-10-CM

## 2020-05-06 ENCOUNTER — Other Ambulatory Visit: Payer: Self-pay | Admitting: Pain Medicine

## 2020-05-06 DIAGNOSIS — M7918 Myalgia, other site: Secondary | ICD-10-CM

## 2020-05-08 NOTE — Progress Notes (Signed)
Name: Dean Ford   MRN: [id number]    DOB: 05-Dec-1975   Date:05/09/2020       Progress Note  Subjective:    Chief Complaint  Chief Complaint  Patient presents with  . Nausea    and throat issues, stuff getting caught in throat.  onset over year worse since being on antiacids    Attempted to connect with  Dean Ford  on 05/09/20 at 11:40 AM EDT by a video enabled telemedicine application  Have waited for pt to connect for the past 15+ min, it is currently 12:15 PM I have sent him a mychart message and staff has called his cell phone but its gone to voicemail   verified that I am speaking with the correct person using two identifiers.  I discussed the limitations of evaluation and management by telemedicine and the availability of in person appointments. The patient expressed understanding and agreed to proceed. Staff also discussed with the patient that there may be a patient responsible charge related to this service. Patient Location:  Provider Location:  Additional Individuals present:   HPI   Patient Active Problem List   Diagnosis Date Noted  . Neurogenic pain 07/03/2019  . DDD (degenerative disc disease), lumbosacral 06/22/2019  . 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 Occult blood in stools   . Rectal polyp   . Diverticulosis of large intestine without diverticulitis   . Pharmacologic therapy 02/09/2019  . Disorder of skeletal system 02/09/2019  . Problems influencing health status 02/09/2019  . Hypogonadism male 09/28/2018  . Rheumatoid arthritis ([place]) 09/28/2018  . Carpal tunnel syndrome 09/28/2018  . Chronic musculoskeletal pain 04/25/2018  . Spondylosis without myelopathy or radiculopathy, lumbosacral region 02/08/2018  . Chronic lower extremity pain (Secondary Area of Pain) (Bilateral) (R>L) 02/08/2018  . 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 Spasm of paraspinal  muscle 01/22/2016  . Lower extremity numbness (Bilateral) (R>L) 01/22/2016  . Chronic lumbar radicular pain (Right: S1, Bilateral: L5) (Bilateral) (R>L) 01/22/2016  . Chronic hip pain (Right) 01/22/2016  . Osteoarthritis of hip (Right) 01/22/2016  . Chronic low back pain (Primary Area of Pain) (Bilateral) (R>L) 12/18/2015  . Erectile dysfunction 12/18/2015  . Chronic pain syndrome 09/02/2015  . Long term prescription opiate use 09/02/2015  . Encounter for chronic pain management 09/02/2015  . Chronic sacroiliac joint pain (Bilateral) (R>L) 06/11/2015  . Lumbar facet hypertrophy (Multilevel) 06/11/2015  . Lumbosacral radiculopathy at S1 (right-sided) 06/11/2015  . Encounter for therapeutic drug level monitoring 06/03/2015  . Long term current use of opiate analgesic 06/03/2015  . Uncomplicated opioid dependence ([place]) 06/03/2015  . Opiate use 06/03/2015  . Lumbar spondylosis 06/03/2015  . Lumbar facet syndrome (Bilateral) (R>L) 06/03/2015  . Insomnia, persistent 06/03/2015  . Intermittent muscle cramps 06/03/2015  . Always thirsty 06/03/2015  . Avitaminosis D 06/03/2015  . Lumbar spine pain 06/03/2015  . Hip arthrosis 06/03/2015  . Class 2 severe obesity due to excess calories with serious comorbidity and body mass index (BMI) of 38.0 to 38.9 in adult ([place]) 06/03/2015  . DDD (degenerative disc disease), lumbar 06/03/2015  . L4-L5 disc bulge 06/03/2015  . Ligamentum flavum hypertrophy ([place]) 06/03/2015  . Bulging lumbar disc 06/03/2015  . Sleep apnea 06/03/2015  . Dyslipidemia 03/01/2015  . Dehydration symptoms 01/28/2015  . Essential hypertension 07/20/2014  . Chest discomfort 07/20/2014  . Hypothyroidism 07/20/2014  . Adrenal insufficiency ([place]) 07/20/2014  Social History   Tobacco Use  . Smoking status: Former Smoker    Quit date: 11/30/1995    Years since quitting: 24.4  . Smokeless tobacco: Former Systems developer    Quit date: 11/30/1995  Substance Use Topics  . Alcohol use: Yes     Alcohol/week: 0.0 standard drinks    Comment: rarely     Current Outpatient Medications:  .  carvedilol (COREG) 6.25 MG tablet, TAKE 1 TABLET BY MOUTH TWO TIMES DAILY, Disp: 180 tablet, Rfl: 3 .  fludrocortisone (FLORINEF) 0.1 MG tablet, Take 0.1 mg by mouth daily as needed for dizziness., Disp: , Rfl:  .  gabapentin (NEURONTIN) 300 MG capsule, Take 1 capsule (300 mg total) by mouth 2 (two) times daily., Disp: 60 capsule, Rfl: 2 .  hydroxychloroquine (PLAQUENIL) 200 MG tablet, Take 1 tablet by mouth daily., Disp: , Rfl: 3 .  levothyroxine (SYNTHROID, LEVOTHROID) 100 MCG tablet, Take 1 tablet by mouth daily., Disp: , Rfl: 3 .  olmesartan (BENICAR) 40 MG tablet, Take 1 tablet (40 mg total) by mouth daily., Disp: 30 tablet, Rfl: 5 .  Oxycodone HCl 10 MG TABS, Take 1 tablet (10 mg total) by mouth every 6 (six) hours as needed. Must last 30 days., Disp: 120 tablet, Rfl: 0 .  predniSONE (DELTASONE) 5 MG tablet, Take 5 mg by mouth daily with breakfast. take 1 1/2 (7.5mg ) qd, Disp: , Rfl:  .  sildenafil (VIAGRA) 100 MG tablet, Take 1 tablet by mouth daily as needed., Disp: , Rfl: 12 .  testosterone cypionate (DEPOTESTOTERONE CYPIONATE) 200 MG/ML injection, Inject 100 mg into the muscle every 14 (fourteen) days. , Disp: , Rfl:  .  Vitamin D, Ergocalciferol, (DRISDOL) 50000 units CAPS capsule, Take 50,000 Units by mouth once a week., Disp: , Rfl: 2 .  baclofen (LIORESAL) 10 MG tablet, Take 1 tablet (10 mg total) by mouth 4 (four) times daily., Disp: 120 tablet, Rfl: 5 .  pantoprazole (PROTONIX) 20 MG tablet, Take 1 tablet (20 mg total) by mouth every morning. One hour before breakfast (Patient not taking: Reported on 05/09/2020), Disp: 90 tablet, Rfl: 1  Allergies  Allergen Reactions  . Borax Rash    Borax detergent      Review of Systems    Objective:   Virtual encounter, vitals limited, only able to obtain the following Today's Vitals   05/09/20 1147  Weight: 295 lb (133.8 kg)    Height: 6\' 2"  (1.88 m)   Body mass index is 37.88 kg/m. Nursing Note and Vital Signs reviewed.  Physical Exam  PE limited by telephone encounter  No results found for this or any previous visit (from the past 72 hour(s)).  Assessment and Plan:   No diagnosis found.   -Red flags and when to present for emergency care or RTC including fever >101.77F, chest pain, shortness of breath, new/worsening/un-resolving symptoms, reviewed with patient at time of visit. Follow up and care instructions discussed and provided in AVS. - I discussed the assessment and treatment plan with the patient. The patient was provided an opportunity to ask questions and all were answered. The patient agreed with the plan and demonstrated an understanding of the instructions.  I provided  minutes of non-face-to-face time during this encounter.  Delsa Grana, PA-C 05/09/20 12:14 PM

## 2020-05-09 ENCOUNTER — Encounter: Payer: Self-pay | Admitting: Family Medicine

## 2020-05-09 ENCOUNTER — Other Ambulatory Visit: Payer: Self-pay

## 2020-05-09 ENCOUNTER — Telehealth (INDEPENDENT_AMBULATORY_CARE_PROVIDER_SITE_OTHER): Payer: No Typology Code available for payment source | Admitting: Family Medicine

## 2020-05-09 VITALS — Ht 74.0 in | Wt 295.0 lb

## 2020-05-09 DIAGNOSIS — Z5329 Procedure and treatment not carried out because of patient's decision for other reasons: Secondary | ICD-10-CM

## 2020-05-10 ENCOUNTER — Other Ambulatory Visit: Payer: Self-pay | Admitting: Endocrinology

## 2020-05-18 DIAGNOSIS — F1291 Cannabis use, unspecified, in remission: Secondary | ICD-10-CM | POA: Insufficient documentation

## 2020-05-18 DIAGNOSIS — Z9114 Patient's other noncompliance with medication regimen: Secondary | ICD-10-CM | POA: Insufficient documentation

## 2020-05-18 DIAGNOSIS — F1991 Other psychoactive substance use, unspecified, in remission: Secondary | ICD-10-CM | POA: Insufficient documentation

## 2020-05-18 NOTE — Progress Notes (Signed)
PROVIDER NOTE: Information contained herein reflects review and annotations entered in association with encounter. Interpretation of such information and data should be left to medically-trained personnel. Information provided to patient can be located elsewhere in the medical record under "Patient Instructions". Document created using STT-dictation technology, any transcriptional errors that may result from process are unintentional.    Patient: Dean Ford  Service Category: E/M  Provider: Gaspar Cola, MD  DOB: [birth year]  DOS: 05/22/2020  Specialty: Interventional Pain Management  MRN: [id number]  Setting: Ambulatory outpatient  PCP: [facility]  Type: Established Patient    Referring Provider: Hubbard Hartshorn, FNP  Location: Office  Delivery: Face-to-face     HPI  [name], a 44 y.o. year old male, is here today because of his Chronic pain syndrome [G89.4]. [name] primary complain today is Back Pain Last encounter: My last encounter with him was on 05/06/2020. Pertinent problems: [name] has Lumbar spondylosis; Lumbar facet syndrome (Bilateral) (R>L); Intermittent muscle cramps; Lumbar spine pain; Hip arthrosis; DDD (degenerative disc disease), lumbar; L4-L5 disc bulge; Ligamentum flavum hypertrophy; Bulging lumbar disc; Chronic sacroiliac joint pain (Bilateral) (R>L); Lumbar facet hypertrophy (Multilevel); Lumbosacral radiculopathy at S1 (right-sided); Chronic pain syndrome; Chronic low back pain (Primary Area of Pain) (Bilateral) (R>L); Spasm of paraspinal muscle; Lower extremity numbness (Bilateral) (R>L); Chronic lumbar radicular pain (Right: S1, Bilateral: L5) (Bilateral) (R>L); Chronic hip pain (Right); Osteoarthritis of hip (Right); 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 Pain Assessment: Severity of Chronic pain is reported as a 2 /10. Location: Back Lower/Denies. Onset:  . Quality: Cramping. Modifying factor(s): sitting. Vitals:  height is _0  (1.88 m) and weight is 300 lb (136.1 kg). His temperature is 97.5 F (36.4 C) (abnormal). His blood pressure is 152/87 (abnormal) and his pulse is 100. His oxygen saturation is 99%.   Reason for encounter: medication management.  Unfortunately, the UDS done on 04/23/2020 came back positive for unreported carboxy-THC.  This is the second UDS on a row, the previous one having been on 01/24/2020, that came back positive for THC. Today I spoke to the patient about this and he explained to me that he has been having a lot of problems with his gastrointestinal tract were he has been having chronic nausea with a constant feeling of being full and also having severe constipation. We had talked about this in the past thinking that it was a possible opioid-induced constipation, but as it turns out it was more of a gastroparesis. This seems to be associated with thyroid problems, the opioids, but it is a lot more common with diabetes. In any case, he had tried multiple things for the nausea including the cannabinoids, but he determined that none of that work. It was only when he stopped the opioids that he noticed a significant difference and he no longer has any of the gastrointestinal symptoms and not only that but he also has noticed that his anxiety and the panics attacks have completely gone away. He also noticed that when he stopped the oxycodone he is now experiencing a lot less pain. From the description, it would seem that he has been experiencing opioid hyperalgesia. In  view of these findings, we will no  longer be using any chronic opioid analgesics. We will remain available for interventional therapies, which he indicates that at this point he does not feel that he need since things have gotten better since he stopped the opioids. Today we also had a conversation regarding the gabapentin and he indicated that he was taking the 300 mg pill, 4 of them at bedtime (1200 mg). He refers that this used to work a lot better than taking it through the day. In view of this today we have talked about switching him to the 600 mg pill and having him take 2 tablets at bedtime. Since he has been taking that amount for quite some time, I have provided him today with enough medication to last for the next 90 days after which we are transferring back to his PCP for follow-up and continued care.  Pharmacotherapy Assessment   Analgesic: Oxycodone IR 10 mg. 1 tab PO q 6 hrs (40 mg/day of oxycodone) MME/day:60 mg/day.   Monitoring: [place] PMP: PDMP reviewed during this encounter.       Pharmacotherapy: No side-effects or adverse reactions reported. Compliance: No problems identified. Effectiveness: Clinically acceptable.  Chauncey Fischer, RN  05/22/2020 11:51 AM  Sign when Signing Visit Nursing Pain Medication Assessment:  Safety precautions to be maintained throughout the outpatient stay will include: orient to surroundings, keep bed in low position, maintain call bell within reach at all times, provide assistance with transfer out of bed and ambulation.  Medication Inspection Compliance: [name] did not comply with our request to bring his pills to be counted. He was reminded that bringing the medication bottles, even when empty, is a requirement.  Medication: None brought in. Pill/Patch Count: None available to be counted. Bottle Appearance: No container available. Did not bring bottle(s) to appointment. Filled Date: N/A Last Medication intake:  stop taking 9/24/21Safety precautions to be maintained throughout the  outpatient stay will include: orient to surroundings, keep bed in low position, maintain call bell within reach at all times, provide assistance with transfer out of bed and ambulation.     UDS:  Summary  Date Value Ref Range Status  04/23/2020 Note  Final    Comment:    ==================================================================== [facility] (MW) ==================================================================== Test                             Result       Flag       Units  Drug Present and Declared for Prescription Verification   Oxycodone                      2672         EXPECTED   ng/mg creat   Oxymorphone                    >3012        EXPECTED   ng/mg creat   Noroxycodone                   1693         EXPECTED   ng/mg creat   Noroxymorphone                 861          EXPECTED   ng/mg creat    Sources of oxycodone are scheduled prescription medications.  Oxymorphone, noroxycodone, and noroxymorphone are expected    metabolites of oxycodone. Oxymorphone is also available as a    scheduled prescription medication.  Drug Present not Declared for Prescription Verification   Carboxy-THC                    66           UNEXPECTED ng/mg creat    Carboxy-THC is a metabolite of tetrahydrocannabinol (THC). Source of    THC is most commonly herbal marijuana or marijuana-based products,    but THC is also present in a scheduled prescription medication.    Trace amounts of THC can be present in hemp and cannabidiol (CBD)    products. This test is not intended to distinguish between delta-9-    tetrahydrocannabinol, the predominant form of THC in most herbal or    marijuana-based products, and delta-8-tetrahydrocannabinol.    Tramadol                       26           UNEXPECTED ng/mg creat   O-Desmethyltramadol            32           UNEXPECTED ng/mg creat    Source of tramadol is a prescription medication. O-desmethyltramadol    is an expected metabolite of  tramadol.  ==================================================================== Test                      Result    Flag   Units      Ref Range   Creatinine              332              mg/dL      >=20 ==================================================================== Declared Medications:  The flagging and interpretation on this report are based on the  following declared medications.  Unexpected results may arise from  inaccuracies in the declared medications.   **Note: The testing scope of this panel includes these medications:   Oxycodone   **Note: The testing scope of this panel does not include the  following reported medications:   Baclofen (Lioresal)  Carvedilol (Coreg)  Fludrocortisone (Florinef)  Gabapentin (Neurontin)  Hydroxychloroquine (Plaquenil)  Levothyroxine (Synthroid)  Olmesartan (Benicar)  Pantoprazole (Protonix)  Prednisone (Deltasone)  Sildenafil (Viagra)  Testosterone  Vitamin D2 (Drisdol) ==================================================================== For clinical consultation, please call [phone]. ====================================================================      ROS  Constitutional: Denies any fever or chills Gastrointestinal: No reported hemesis, hematochezia, vomiting, or acute GI distress Musculoskeletal: Denies any acute onset joint swelling, redness, loss of ROM, or weakness Neurological: No reported episodes of acute onset apraxia, aphasia, dysarthria, agnosia, amnesia, paralysis, loss of coordination, or loss of consciousness  Medication Review  Oxycodone HCl, Vitamin D (Ergocalciferol), carvedilol, fludrocortisone, gabapentin, hydroxychloroquine, levothyroxine, olmesartan, predniSONE, sildenafil, and testosterone cypionate  History Review  Allergy: [name] is allergic to borax. Drug: [name]  reports no history of drug use. Alcohol:  reports current alcohol use. Tobacco:  reports that he quit smoking about 24  years ago. He quit smokeless tobacco use about 24 years ago. Social: Dean Ford  reports that he quit smoking about 24 years ago. He quit smokeless tobacco use about 24 years ago. He reports current alcohol use. He reports that he does not use drugs. Medical:  has a past medical history of Acute lumbar radiculopathy (Right) (S1) (05/26/2016),  Acute myofascial pain (05/04/2017), Acute postoperative pain (06/22/2019), Adrenal insufficiency ([place]), Anxiety, Anxiety disorder (01/28/2015), Arthritis, Back pain, chronic (06/03/2015), Hypertension, Hypothyroid, Sleep apnea, and Thyroid disease. Surgical: Dean Ford  has a past surgical history that includes Elbow surgery; Back surgery; Colonoscopy with propofol (N/A, 02/23/2019); and discectomy (N/A, 2017, 2018, 2019). Family: family history includes Heart attack (age of onset: 66) in his father; Hypertension in his father and sister; Osteoarthritis in his mother.  Laboratory Chemistry Profile   Renal Lab Results  Component Value Date   BUN 8 11/15/2019   CREATININE 0.97 42/35/3614   BCR NOT APPLICABLE 43/15/4008   GFRAA 109 11/15/2019   GFRNONAA 94 11/15/2019     Hepatic Lab Results  Component Value Date   AST 25 11/15/2019   ALT 30 11/15/2019   ALBUMIN 4.7 07/26/2019   ALKPHOS 54 07/26/2019     Electrolytes Lab Results  Component Value Date   NA 139 11/15/2019   K 4.5 11/15/2019   CL 105 11/15/2019   CALCIUM 9.2 11/15/2019   MG 2.1 12/19/2012     Bone No results found for: VD25OH, VD125OH2TOT, QP6195KD3, OI7124PY0, 25OHVITD1, 25OHVITD2, 25OHVITD3, TESTOFREE, TESTOSTERONE   Inflammation (CRP: Acute Phase) (ESR: Chronic Phase) No results found for: CRP, ESRSEDRATE, LATICACIDVEN     Note: Above Lab results reviewed.  Recent Imaging Review  Fluoro (C-Arm) (<60 min) (No Report) Fluoro was used, but no Radiologist interpretation will be provided.  Please refer to "NOTES" tab for provider progress note. Note: Reviewed        Physical Exam   General appearance: Well nourished, well developed, and well hydrated. In no apparent acute distress Mental status: Alert, oriented x 3 (person, place, & time)       Respiratory: No evidence of acute respiratory distress Eyes: PERLA Vitals: BP (!) 152/87   Pulse 100   Temp (!) 97.5 F (36.4 C)   Ht _0  (1.88 m)   Wt 300 lb (136.1 kg)   SpO2 99%   BMI 38.52 kg/m  BMI: Estimated body mass index is 38.52 kg/m as calculated from the following:   Height as of this encounter: _1  (1.88 m).   Weight as of this encounter: 300 lb (136.1 kg). Ideal: Ideal body weight: 82.2 kg (181 lb 3.5 oz) Adjusted ideal body weight: 103.8 kg (228 lb 11.7 oz)  Assessment   Status Diagnosis  Improved Improved Improved 1. Chronic pain syndrome   2. Chronic low back pain (Primary Area of Pain) (Bilateral) (R>L)   3. Chronic lower extremity pain (Secondary Area of Pain) (Bilateral) (R>L)   4. 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 Neurogenic pain   6. Opioid-induced hyperalgesia   7. Nondiabetic gastroparesis   8. History of illicit drug use   9. History of marijuana use   10. Pain medication agreement broken      Updated Problems: Problem  Opioid-Induced Hyperalgesia   05/22/2020 - as it turns out, the patient indicates improvement of his pain and anxiety after having stopped the oxycodone. From his description of the changes, it would seem that he was experiencing opioid-induced hyperalgesia while taking the oxycodone. I have recommended that he never again go back to the use of chronic opioid analgesics for his chronic pain.   Nondiabetic Gastroparesis    Plan of Care  Problem-specific:  No problem-specific Assessment & Plan notes found for this encounter.  Dean Ford has a current medication list which includes the  following long-term medication(s): carvedilol, gabapentin, levothyroxine, olmesartan, testosterone cypionate, gabapentin,  and [DISCONTINUED] oxycodone hcl.  Pharmacotherapy (Medications Ordered): Meds ordered this encounter  Medications  . gabapentin (NEURONTIN) 600 MG tablet    Sig: Take 1 tablet (600 mg total) by mouth at bedtime.    Dispense:  90 tablet    Refill:  0    Fill one day early if pharmacy is closed on scheduled refill date. May substitute for generic if available.   Orders:  No orders of the defined types were placed in this encounter.  Follow-up plan:   Return if symptoms worsen or fail to improve.      Interventional treatment options: Under consideration:  NOTE: PLAQUENIL ANTICOAGULATION  Possible implant pump trial    Therapeutic/palliative (PRN):  Palliative Caudal ESI  Palliative right L5-S1 LESI  Palliative rightlumbar facet block #6  Diagnostic left lumbar facet block #3  Palliative right lumbar facet RFA #2 (DO NOT REPEAT. NO RFA. No adequate ID stimulation. Procedure aborted.) Palliative right SI joint block #2  Palliative right IA hip joint injection Palliative Lumbar TPI/MNB #2       Recent Visits Date Type Provider Dept  04/02/20 Telemedicine Gillis Santa, MD [facility]  03/04/20 Telemedicine Milinda Pointer, MD [facility]  Showing recent visits within past 90 days and meeting all other requirements Today's Visits Date Type Provider Dept  05/22/20 Office Visit Milinda Pointer, MD [facility]  Showing today's visits and meeting all other requirements Future Appointments No visits were found meeting these conditions. Showing future appointments within next 90 days and meeting all other requirements  I discussed the assessment and treatment plan with the patient. The patient was provided an opportunity to ask questions and all were answered. The patient agreed with the plan and demonstrated an understanding of the instructions.  Patient advised to call back or seek an in-person evaluation if the symptoms or condition  worsens.  Duration of encounter: 30 minutes.  Note by: Gaspar Cola, MD Date: 05/22/2020; Time: 12:59 PM

## 2020-05-22 ENCOUNTER — Encounter: Payer: Self-pay | Admitting: Pain Medicine

## 2020-05-22 ENCOUNTER — Other Ambulatory Visit: Payer: Self-pay

## 2020-05-22 ENCOUNTER — Ambulatory Visit: Payer: No Typology Code available for payment source | Attending: Pain Medicine | Admitting: Pain Medicine

## 2020-05-22 ENCOUNTER — Other Ambulatory Visit: Payer: Self-pay | Admitting: Pain Medicine

## 2020-05-22 VITALS — BP 152/87 | HR 100 | Temp 97.5°F | Ht 74.0 in | Wt 300.0 lb

## 2020-05-22 DIAGNOSIS — M79604 Pain in right leg: Secondary | ICD-10-CM | POA: Diagnosis not present

## 2020-05-22 DIAGNOSIS — M5441 Lumbago with sciatica, right side: Secondary | ICD-10-CM | POA: Insufficient documentation

## 2020-05-22 DIAGNOSIS — R208 Other disturbances of skin sensation: Secondary | ICD-10-CM | POA: Insufficient documentation

## 2020-05-22 DIAGNOSIS — F1991 Other psychoactive substance use, unspecified, in remission: Secondary | ICD-10-CM

## 2020-05-22 DIAGNOSIS — M961 Postlaminectomy syndrome, not elsewhere classified: Secondary | ICD-10-CM | POA: Insufficient documentation

## 2020-05-22 DIAGNOSIS — M792 Neuralgia and neuritis, unspecified: Secondary | ICD-10-CM | POA: Diagnosis present

## 2020-05-22 DIAGNOSIS — F1291 Cannabis use, unspecified, in remission: Secondary | ICD-10-CM

## 2020-05-22 DIAGNOSIS — T402X5A Adverse effect of other opioids, initial encounter: Secondary | ICD-10-CM | POA: Insufficient documentation

## 2020-05-22 DIAGNOSIS — Z87898 Personal history of other specified conditions: Secondary | ICD-10-CM | POA: Insufficient documentation

## 2020-05-22 DIAGNOSIS — Z9114 Patient's other noncompliance with medication regimen: Secondary | ICD-10-CM | POA: Insufficient documentation

## 2020-05-22 DIAGNOSIS — K3184 Gastroparesis: Secondary | ICD-10-CM | POA: Diagnosis present

## 2020-05-22 DIAGNOSIS — G8929 Other chronic pain: Secondary | ICD-10-CM | POA: Insufficient documentation

## 2020-05-22 DIAGNOSIS — M79605 Pain in left leg: Secondary | ICD-10-CM | POA: Diagnosis present

## 2020-05-22 DIAGNOSIS — G894 Chronic pain syndrome: Secondary | ICD-10-CM | POA: Diagnosis present

## 2020-05-22 MED ORDER — GABAPENTIN 600 MG PO TABS: 600.0000 mg | ORAL_TABLET | Freq: Every day | ORAL | 0 refills | Status: DC

## 2020-05-22 NOTE — Progress Notes (Signed)
Nursing Pain Medication Assessment:  Safety precautions to be maintained throughout the outpatient stay will include: orient to surroundings, keep bed in low position, maintain call bell within reach at all times, provide assistance with transfer out of bed and ambulation.  Medication Inspection Compliance: Dean Ford did not comply with our request to bring his pills to be counted. He was reminded that bringing the medication bottles, even when empty, is a requirement.  Medication: None brought in. Pill/Patch Count: None available to be counted. Bottle Appearance: No container available. Did not bring bottle(s) to appointment. Filled Date: N/A Last Medication intake:  stop taking 9/24/21Safety precautions to be maintained throughout the outpatient stay will include: orient to surroundings, keep bed in low position, maintain call bell within reach at all times, provide assistance with transfer out of bed and ambulation.

## 2020-06-05 ENCOUNTER — Other Ambulatory Visit: Payer: Self-pay | Admitting: Infectious Diseases

## 2020-06-05 ENCOUNTER — Telehealth: Payer: Self-pay | Admitting: Infectious Diseases

## 2020-06-05 ENCOUNTER — Telehealth: Payer: Self-pay | Admitting: Physician Assistant

## 2020-06-05 DIAGNOSIS — G4733 Obstructive sleep apnea (adult) (pediatric): Secondary | ICD-10-CM

## 2020-06-05 DIAGNOSIS — U071 COVID-19: Secondary | ICD-10-CM

## 2020-06-05 NOTE — Telephone Encounter (Signed)
Started symptoms on 10/14.  Qualifiers include BMI >30  Scheduled tomorrow AM. Will email +home test to [facility] inbox

## 2020-06-05 NOTE — Telephone Encounter (Signed)
Called to Discuss with patient about Covid symptoms and the use of the monoclonal antibody infusion for those with mild to moderate Covid symptoms and at a high risk of hospitalization.     Pt appears to qualify for this infusion due to co-morbid conditions and/or a member of an at-risk group in accordance with the FDA Emergency Use Authorization.    Unable to reach pt. Left voice mail and sent mychart message.

## 2020-06-05 NOTE — Progress Notes (Signed)
I connected by phone with [name] on 06/05/2020 at 4:08 PM to discuss the potential use of a new treatment for mild to moderate COVID-19 viral infection in non-hospitalized patients.  This patient is a 44 y.o. male that meets the FDA criteria for Emergency Use Authorization of COVID monoclonal antibody casirivimab/imdevimab or bamlanivimab/eteseviamb.  Has a (+) direct SARS-CoV-2 viral test result  Has mild or moderate COVID-19   Is NOT hospitalized due to COVID-19  Is within 10 days of symptom onset  Has at least one of the high risk factor(s) for progression to severe COVID-19 and/or hospitalization as defined in EUA.  Specific high risk criteria : BMI > 25   I have spoken and communicated the following to the patient or parent/caregiver regarding COVID monoclonal antibody treatment:  1. FDA has authorized the emergency use for the treatment of mild to moderate COVID-19 in adults and pediatric patients with positive results of direct SARS-CoV-2 viral testing who are 33 years of age and older weighing at least 40 kg, and who are at high risk for progressing to severe COVID-19 and/or hospitalization.  2. The significant known and potential risks and benefits of COVID monoclonal antibody, and the extent to which such potential risks and benefits are unknown.  3. Information on available alternative treatments and the risks and benefits of those alternatives, including clinical trials.  4. Patients treated with COVID monoclonal antibody should continue to self-isolate and use infection control measures (e.g., wear mask, isolate, social distance, avoid sharing personal items, clean and disinfect "high touch" surfaces, and frequent handwashing) according to CDC guidelines.   5. The patient or parent/caregiver has the option to accept or refuse COVID monoclonal antibody treatment.  After reviewing this information with the patient, the patient has agreed to receive one of the available  covid 19 monoclonal antibodies and will be provided an appropriate fact sheet prior to infusion. Janene Madeira, NP 06/05/2020 4:08 PM

## 2020-06-06 ENCOUNTER — Ambulatory Visit (HOSPITAL_COMMUNITY)
Admission: RE | Admit: 2020-06-06 | Discharge: 2020-06-06 | Disposition: A | Discharge status: Home / Self Care | Payer: No Typology Code available for payment source | Source: Ambulatory Visit | Attending: Pulmonary Disease | Admitting: Pulmonary Disease

## 2020-06-06 ENCOUNTER — Other Ambulatory Visit (HOSPITAL_COMMUNITY): Payer: Self-pay

## 2020-06-06 DIAGNOSIS — U071 COVID-19: Secondary | ICD-10-CM | POA: Diagnosis not present

## 2020-06-06 DIAGNOSIS — Z6838 Body mass index (BMI) 38.0-38.9, adult: Secondary | ICD-10-CM | POA: Diagnosis present

## 2020-06-06 DIAGNOSIS — G4733 Obstructive sleep apnea (adult) (pediatric): Secondary | ICD-10-CM | POA: Diagnosis present

## 2020-06-06 MED ORDER — SODIUM CHLORIDE 0.9 % IV SOLN: INTRAVENOUS | Status: DC | PRN

## 2020-06-06 MED ORDER — DIPHENHYDRAMINE HCL 50 MG/ML IJ SOLN: 50.0000 mg | Freq: Once | INTRAMUSCULAR | Status: DC | PRN

## 2020-06-06 MED ORDER — SODIUM CHLORIDE 0.9 % IV BOLUS
1000.0000 mL | Freq: Once | INTRAVENOUS | Status: AC
  Administered 2020-06-06: 1000 mL via INTRAVENOUS

## 2020-06-06 MED ORDER — FAMOTIDINE IN NACL 20-0.9 MG/50ML-% IV SOLN: 20.0000 mg | Freq: Once | INTRAVENOUS | Status: DC | PRN

## 2020-06-06 MED ORDER — ALBUTEROL SULFATE HFA 108 (90 BASE) MCG/ACT IN AERS: 2.0000 | INHALATION_SPRAY | Freq: Once | RESPIRATORY_TRACT | Status: DC | PRN

## 2020-06-06 MED ORDER — SODIUM CHLORIDE 0.9 % IV SOLN: Freq: Once | INTRAVENOUS | Status: AC

## 2020-06-06 MED ORDER — METHYLPREDNISOLONE SODIUM SUCC 125 MG IJ SOLR: 125.0000 mg | Freq: Once | INTRAMUSCULAR | Status: DC | PRN

## 2020-06-06 MED ORDER — EPINEPHRINE 0.3 MG/0.3ML IJ SOAJ: 0.3000 mg | Freq: Once | INTRAMUSCULAR | Status: DC | PRN

## 2020-06-06 NOTE — Progress Notes (Signed)
  Diagnosis: COVID-19  Physician: Dr. Asencion Noble  Procedure: Covid Infusion Clinic Med: bamlanivimab\etesevimab infusion - Provided patient with bamlanimivab\etesevimab fact sheet for patients, parents and caregivers prior to infusion.  Complications: No immediate complications noted.  Discharge: Discharged home   Dean Ford 06/06/2020

## 2020-06-06 NOTE — Discharge Instructions (Signed)

## 2020-06-25 ENCOUNTER — Other Ambulatory Visit: Payer: Self-pay

## 2020-06-25 ENCOUNTER — Telehealth (INDEPENDENT_AMBULATORY_CARE_PROVIDER_SITE_OTHER): Payer: No Typology Code available for payment source | Admitting: Physician Assistant

## 2020-06-25 ENCOUNTER — Encounter: Payer: Self-pay | Admitting: Physician Assistant

## 2020-06-25 ENCOUNTER — Telehealth: Payer: Self-pay

## 2020-06-25 VITALS — Ht 74.0 in | Wt 300.0 lb

## 2020-06-25 DIAGNOSIS — Z9989 Dependence on other enabling machines and devices: Secondary | ICD-10-CM | POA: Diagnosis not present

## 2020-06-25 DIAGNOSIS — I10 Essential (primary) hypertension: Secondary | ICD-10-CM | POA: Diagnosis not present

## 2020-06-25 DIAGNOSIS — E669 Obesity, unspecified: Secondary | ICD-10-CM

## 2020-06-25 DIAGNOSIS — G4733 Obstructive sleep apnea (adult) (pediatric): Secondary | ICD-10-CM

## 2020-06-25 NOTE — Telephone Encounter (Signed)
°  Patient Consent for Virtual Visit         Dean Ford has provided verbal consent on 06/25/2020 for a virtual visit (video or telephone).   CONSENT FOR VIRTUAL VISIT FOR:  Dean Ford  By participating in this virtual visit I agree to the following:  I hereby voluntarily request, consent and authorize [place] and its employed or contracted physicians, physician assistants, nurse practitioners or other licensed health care professionals (the Practitioner), to provide me with telemedicine health care services (the Services") as deemed necessary by the treating Practitioner. I acknowledge and consent to receive the Services by the Practitioner via telemedicine. I understand that the telemedicine visit will involve communicating with the Practitioner through live audiovisual communication technology and the disclosure of certain medical information by electronic transmission. I acknowledge that I have been given the opportunity to request an in-person assessment or other available alternative prior to the telemedicine visit and am voluntarily participating in the telemedicine visit.  I understand that I have the right to withhold or withdraw my consent to the use of telemedicine in the course of my care at any time, without affecting my right to future care or treatment, and that the Practitioner or I may terminate the telemedicine visit at any time. I understand that I have the right to inspect all information obtained and/or recorded in the course of the telemedicine visit and may receive copies of available information for a reasonable fee.  I understand that some of the potential risks of receiving the Services via telemedicine include:   Delay or interruption in medical evaluation due to technological equipment failure or disruption;  Information transmitted may not be sufficient (e.g. poor resolution of images) to allow for appropriate medical decision making by the Practitioner;  and/or   In rare instances, security protocols could fail, causing a breach of personal health information.  Furthermore, I acknowledge that it is my responsibility to provide information about my medical history, conditions and care that is complete and accurate to the best of my ability. I acknowledge that Practitioner's advice, recommendations, and/or decision may be based on factors not within their control, such as incomplete or inaccurate data provided by me or distortions of diagnostic images or specimens that may result from electronic transmissions. I understand that the practice of medicine is not an exact science and that Practitioner makes no warranties or guarantees regarding treatment outcomes. I acknowledge that a copy of this consent can be made available to me via my patient portal ([place]), or I can request a printed copy by calling the office of [place].    I understand that my insurance will be billed for this visit.   I have read or had this consent read to me.  I understand the contents of this consent, which adequately explains the benefits and risks of the Services being provided via telemedicine.   I have been provided ample opportunity to ask questions regarding this consent and the Services and have had my questions answered to my satisfaction.  I give my informed consent for the services to be provided through the use of telemedicine in my medical care

## 2020-06-25 NOTE — Progress Notes (Signed)
Virtual Visit via Telephone Note   This visit type was conducted due to national recommendations for restrictions regarding the COVID-19 Pandemic (e.g. social distancing) in an effort to limit this patient's exposure and mitigate transmission in our community.  Due to his co-morbid illnesses, this patient is at least at moderate risk for complications without adequate follow up.  This format is felt to be most appropriate for this patient at this time.  The patient did not have access to video technology/had technical difficulties with video requiring transitioning to audio format only (telephone).  All issues noted in this document were discussed and addressed.  No physical exam could be performed with this format.  Please refer to the patient's chart for his  consent to telehealth for [facility].    Date:  06/25/2020   ID:  Dean Ford, DOB 02-01-1976, MRN [id number] The patient was identified using 2 identifiers.  Patient Location: Home Provider Location: Home Office  PCP:  [facility], [place]  Cardiologist:  Mertie Moores, MD  Evaluation Performed:  Follow-Up Visit  Chief Complaint:  11 months follow up   History of Present Illness:    Dean Ford is a 44 y.o. male with history of hypertension, obstructive sleep apnea on CPAP, adrenal insufficiency, hypothyroidism and recent COVID 05/2020 seen virtually for follow-up.  Initially established care with Dr. Acie Fredrickson in January 2016 for chest pain follow-up.  Had negative stress echocardiogram.  Last seen by Dr. Acie Fredrickson December 2020.  Seen virtually for follow-up.  Had a COVID-19 infection last month and felt better after monoclonal antibody infusion.  Compliant with CPAP.  Not checking his blood pressure periodically however reports normal blood pressure during infusion.  Discussed complications with cardiac disease if uncontrolled.  He denies chest pain, shortness of breath, orthopnea, PND, syncope, lower extremity edema  or melena.  States he is outside in the park for walking.  The patient does not have symptoms concerning for COVID-19 infection (fever, chills, cough, or new shortness of breath).    Past Medical History:  Diagnosis Date  . Acute lumbar radiculopathy (Right) (S1) 05/26/2016  . Acute myofascial pain 05/04/2017  . Acute postoperative pain 06/22/2019  . Adrenal insufficiency ([place])   . Anxiety   . Anxiety disorder 01/28/2015  . Arthritis   . Back pain, chronic 06/03/2015  . Hypertension   . Hypothyroid   . Sleep apnea   . Thyroid disease    Past Surgical History:  Procedure Laterality Date  . BACK SURGERY    . COLONOSCOPY WITH PROPOFOL N/A 02/23/2019   Procedure: COLONOSCOPY WITH PROPOFOL;  Surgeon: Virgel Manifold, MD;  Location: [facility] ENDOSCOPY;  Service: Endoscopy;  Laterality: N/A;  . discectomy N/A 2017, 2018, 2019   x3  L5-S1  . ELBOW SURGERY     right elbow     Current Meds  Medication Sig  . carvedilol (COREG) 6.25 MG tablet TAKE 1 TABLET BY MOUTH TWO TIMES DAILY  . fludrocortisone (FLORINEF) 0.1 MG tablet Take 0.1 mg by mouth daily as needed for dizziness.  . gabapentin (NEURONTIN) 600 MG tablet Take 1 tablet (600 mg total) by mouth at bedtime.  . hydroxychloroquine (PLAQUENIL) 200 MG tablet Take 1 tablet by mouth daily.  Marland Kitchen levothyroxine (SYNTHROID, LEVOTHROID) 100 MCG tablet Take 1 tablet by mouth daily.  Marland Kitchen olmesartan (BENICAR) 40 MG tablet Take 1 tablet (40 mg total) by mouth daily.  . predniSONE (DELTASONE) 5 MG tablet Take 5 mg by mouth daily with  breakfast. take 1 1/2 (7.5mg ) qd  . sildenafil (VIAGRA) 100 MG tablet Take 1 tablet by mouth daily as needed.  . testosterone cypionate (DEPOTESTOTERONE CYPIONATE) 200 MG/ML injection Inject 100 mg into the muscle every 14 (fourteen) days.   . Vitamin D, Ergocalciferol, (DRISDOL) 50000 units CAPS capsule Take 50,000 Units by mouth once a week.     Allergies:   Borax   Social History   Tobacco Use  . Smoking status:  Former Smoker    Quit date: 11/30/1995    Years since quitting: 24.5  . Smokeless tobacco: Former Systems developer    Quit date: 11/30/1995  Vaping Use  . Vaping Use: Never used  Substance Use Topics  . Alcohol use: Yes    Alcohol/week: 0.0 standard drinks    Comment: rarely  . Drug use: No     Family Hx: The patient's family history includes Heart attack (age of onset: 30) in his father; Hypertension in his father and sister; Osteoarthritis in his mother.  ROS:   Please see the history of present illness.    All other systems reviewed and are negative.   Prior CV studies:   The following studies were reviewed today:  Echo 12/15 Study Conclusions   - Left ventricle: The cavity size was normal. Wall thickness was  normal. Systolic function was normal. The estimated ejection  fraction was in the range of 55% to 60%. Wall motion was normal;  there were no regional wall motion abnormalities. Doppler  parameters are consistent with abnormal left ventricular  relaxation (grade 1 diastolic dysfunction).   Impressions:   - Normal study other than impaired relaxation.    Labs/Other Tests and Data Reviewed:    EKG:  No ECG reviewed.  Recent Labs: 11/15/2019: ALT 30; BUN 8; Creat 0.97; Hemoglobin 14.7; Platelets 223; Potassium 4.5; Sodium 139   Recent Lipid Panel Lab Results  Component Value Date/Time   CHOL 217 (H) 11/15/2019 12:36 PM   CHOL 218 (H) 07/26/2019 10:13 AM   TRIG 190 (H) 11/15/2019 12:36 PM   HDL 33 (L) 11/15/2019 12:36 PM   HDL 38 (L) 07/26/2019 10:13 AM   CHOLHDL 6.6 (H) 11/15/2019 12:36 PM   LDLCALC 151 (H) 11/15/2019 12:36 PM    Wt Readings from Last 3 Encounters:  06/25/20 300 lb (136.1 kg)  05/22/20 300 lb (136.1 kg)  05/09/20 295 lb (133.8 kg)     Objective:    Vital Signs:  Ht 6\' 2"  (1.88 m)   Wt 300 lb (136.1 kg)   BMI 38.52 kg/m   BP - reports normal BP last week.    VITAL SIGNS:  reviewed GEN:  no acute distress PSYCH:  normal  affect  ASSESSMENT & PLAN:    1. Hypertension Reports normal blood pressure on last check.  Discussed importance of strict blood pressure control.  No change in medical therapy.  Advised low-sodium diet.  2.  Obstructive sleep apnea -Patient reports compliance with CPAP  3.  Morbid obesity -Continue walking regimen.  Discussed weight loss.  4.  Adrenal insufficiency -Followed by endocrinologist.  COVID-19 Education: The signs and symptoms of COVID-19 were discussed with the patient and how to seek care for testing (follow up with PCP or arrange E-visit). The importance of social distancing was discussed today.  Time:   Today, I have spent  minutes with the patient with telehealth technology discussing the above problems.     Medication Adjustments/Labs and Tests Ordered: Current medicines are reviewed at length  with the patient today.  Concerns regarding medicines are outlined above.   Tests Ordered: No orders of the defined types were placed in this encounter.   Medication Changes: No orders of the defined types were placed in this encounter.   Follow Up:  Virtual Visit  in 1 year(s)  Signed, Leanor Kail, PA  06/25/2020 8:35 AM    [place]

## 2020-06-25 NOTE — Patient Instructions (Signed)
Medication Instructions:  Your physician recommends that you continue on your current medications as directed. Please refer to the Current Medication list given to you today.  *If you need a refill on your cardiac medications before your next appointment, please call your pharmacy*   Lab Work: None If you have labs (blood work) drawn today and your tests are completely normal, you will receive your results only by:  [place] (if you have MyChart) OR  A paper copy in the mail If you have any lab test that is abnormal or we need to change your treatment, we will call you to review the results.   Follow-Up: At [facility], you and your health needs are our priority.  As part of our continuing mission to provide you with exceptional heart care, we have created designated Provider Care Teams.  These Care Teams include your primary Cardiologist (physician) and Advanced Practice Providers (APPs -  Physician Assistants and Nurse Practitioners) who all work together to provide you with the care you need, when you need it.  We recommend signing up for the patient portal called "MyChart".  Sign up information is provided on this After Visit Summary.  MyChart is used to connect with patients for Virtual Visits (Telemedicine).  Patients are able to view lab/test results, encounter notes, upcoming appointments, etc.  Non-urgent messages can be sent to your provider as well.   To learn more about what you can do with MyChart, go to [email or web address].    Your next appointment:   1 year(s)  The format for your next appointment:   In Person  Provider:   You may see Mertie Moores, MD or one of the following Advanced Practice Providers on your designated Care Team:    Richardson Dopp, PA-C  [place], [place]

## 2020-07-05 ENCOUNTER — Other Ambulatory Visit: Payer: Self-pay | Admitting: Endocrinology

## 2020-07-17 ENCOUNTER — Other Ambulatory Visit: Payer: Self-pay | Admitting: Cardiovascular Disease

## 2020-07-26 ENCOUNTER — Other Ambulatory Visit: Payer: Self-pay | Admitting: Family Medicine

## 2020-07-26 ENCOUNTER — Encounter: Payer: Self-pay | Admitting: Family Medicine

## 2020-07-26 ENCOUNTER — Ambulatory Visit (INDEPENDENT_AMBULATORY_CARE_PROVIDER_SITE_OTHER): Payer: No Typology Code available for payment source | Admitting: Family Medicine

## 2020-07-26 ENCOUNTER — Other Ambulatory Visit: Payer: Self-pay

## 2020-07-26 VITALS — BP 128/80 | HR 79 | Temp 97.6°F | Resp 16 | Ht 74.0 in | Wt 309.6 lb

## 2020-07-26 DIAGNOSIS — K229 Disease of esophagus, unspecified: Secondary | ICD-10-CM

## 2020-07-26 DIAGNOSIS — R1310 Dysphagia, unspecified: Secondary | ICD-10-CM | POA: Diagnosis not present

## 2020-07-26 DIAGNOSIS — M792 Neuralgia and neuritis, unspecified: Secondary | ICD-10-CM

## 2020-07-26 DIAGNOSIS — Z6838 Body mass index (BMI) 38.0-38.9, adult: Secondary | ICD-10-CM

## 2020-07-26 DIAGNOSIS — E010 Iodine-deficiency related diffuse (endemic) goiter: Secondary | ICD-10-CM | POA: Diagnosis not present

## 2020-07-26 DIAGNOSIS — U071 COVID-19: Secondary | ICD-10-CM

## 2020-07-26 DIAGNOSIS — M069 Rheumatoid arthritis, unspecified: Secondary | ICD-10-CM

## 2020-07-26 DIAGNOSIS — T402X5A Adverse effect of other opioids, initial encounter: Secondary | ICD-10-CM

## 2020-07-26 DIAGNOSIS — G4733 Obstructive sleep apnea (adult) (pediatric): Secondary | ICD-10-CM

## 2020-07-26 DIAGNOSIS — F1120 Opioid dependence, uncomplicated: Secondary | ICD-10-CM

## 2020-07-26 DIAGNOSIS — E039 Hypothyroidism, unspecified: Secondary | ICD-10-CM

## 2020-07-26 DIAGNOSIS — K219 Gastro-esophageal reflux disease without esophagitis: Secondary | ICD-10-CM

## 2020-07-26 DIAGNOSIS — Z5181 Encounter for therapeutic drug level monitoring: Secondary | ICD-10-CM

## 2020-07-26 DIAGNOSIS — R208 Other disturbances of skin sensation: Secondary | ICD-10-CM

## 2020-07-26 DIAGNOSIS — E782 Mixed hyperlipidemia: Secondary | ICD-10-CM

## 2020-07-26 DIAGNOSIS — K3184 Gastroparesis: Secondary | ICD-10-CM

## 2020-07-26 MED ORDER — FLUTICASONE PROPIONATE 50 MCG/ACT NA SUSP: 2.0000 | Freq: Every day | NASAL | 6 refills | Status: DC

## 2020-07-26 MED ORDER — CETIRIZINE HCL 10 MG PO TABS: 10.0000 mg | ORAL_TABLET | Freq: Every day | ORAL | 11 refills | Status: DC

## 2020-07-26 MED ORDER — GABAPENTIN 600 MG PO TABS: 600.0000 mg | ORAL_TABLET | Freq: Every day | ORAL | 1 refills | Status: DC

## 2020-07-26 NOTE — Progress Notes (Signed)
Patient ID: Dean Ford, male    DOB: 01/20/1976, 44 y.o.   MRN: [id number]  PCP: Delsa Grana, PA-C  Chief Complaint  Patient presents with  . Swallowing Problems    "Feels like something is stuck in throat"    Subjective:   Dean Ford is a 44 y.o. male, presents to clinic with CC of the following:  HPI   Patient has concerns that something is stuck in his throat is having difficulty swallowing he feels like his thyroid is large or like something is stuck in the back of his throat.   He has not had any choking on food but does like sometimes he has trouble swallowing both liquids and solids    Many of his medications have changed, he has complicated medical history and the only other prior office visit where I saw the patient he had multiple concerns and symptoms.  Since that time he had hyperalgesia and a lot of GI symptoms with having opioid related gastroparesis symptoms a lot of abdominal pain bloating indigestion, he was able to get off all narcotic pain medication and some of his GI symptoms did improve  He has a history of chronic back pain, rheumatoid arthritis, low testosterone, hypothyroid, managed by several specialist out of her system with limited records through care everywhere, he is on prednisone daily, continues to be on levothyroxine 1000 mcg, labs were recently done but difficult to review, on testosterone topical gel and has stopped the injections, he is on gabapentin for pain management and for RA continues to be on steroid and Plaquenil  Hypertension is managed with Benicar 40 mg daily and carvedilol 6.25 mg twice daily   MEDICATIONS   Medication Instructions Dosage Frequency Start Date End Date Duration Status  Olmesartan Medoxomil 40 MG Orally Once a day 1 tablet 24h    Active  Levothyroxine Sodium 100 MCG Orally Once a day 1 tablet 24h    Active  Testosterone 50 MG/5GM (1%) Transdermal Once a day 1 packet to skin in the morning to shoulder,  upper arms or abdomen 24h 19 Nov, 2021  30 days Active  Viagra 100 MG Orally Once a day 1 tablet as needed 24h    Active  Gabapentin 300 MG Orally at night and 3 durning the day. 4 tablets     Active  Ergocalciferol 1.25 MG (50000 UT)  1 capsule Once a week Orally 30 days 28     Active  predniSONE 5 MG Orally Twice a day 2 tabs am & 1tab in pm 12h    Active  Hydroxychloroquine Sulfate 200 MG Orally once a day TAKE 2 TABLETS BY MOUTH WITH FOOD OR MILK ONCE A DAY 24h   30 days Active  Fludrocortisone Acetate 0.1 MG Orally Twice a day 1 tablet once a day orally 30 12h    Active     Patient Active Problem List   Diagnosis Date Noted  . Opioid-induced hyperalgesia 05/22/2020  . Nondiabetic gastroparesis 05/22/2020    Class: History of  . History of illicit drug use 42/70/6237  . History of marijuana use 05/18/2020  . Pain medication agreement broken 05/18/2020  . Neurogenic pain 07/03/2019  . DDD (degenerative disc disease), lumbosacral 06/22/2019  . 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 Occult blood in stools   . Rectal polyp   . Diverticulosis of large intestine without diverticulitis   . Pharmacologic therapy 02/09/2019  . Disorder of  skeletal system 02/09/2019  . Problems influencing health status 02/09/2019  . Hypogonadism male 09/28/2018  . Rheumatoid arthritis ([place]) 09/28/2018  . Carpal tunnel syndrome 09/28/2018  . Chronic musculoskeletal pain 04/25/2018  . Spondylosis without myelopathy or radiculopathy, lumbosacral region 02/08/2018  . Chronic lower extremity pain (Secondary Area of Pain) (Bilateral) (R>L) 02/08/2018  . 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 Spasm of paraspinal muscle 01/22/2016  . Lower extremity numbness (Bilateral) (R>L) 01/22/2016  . Chronic lumbar radicular pain (Right: S1, Bilateral: L5) (Bilateral) (R>L) 01/22/2016  . Chronic hip pain  (Right) 01/22/2016  . Osteoarthritis of hip (Right) 01/22/2016  . Chronic low back pain (Primary Area of Pain) (Bilateral) (R>L) 12/18/2015  . Erectile dysfunction 12/18/2015  . Chronic pain syndrome 09/02/2015  . Long term prescription opiate use 09/02/2015  . Encounter for chronic pain management 09/02/2015  . Chronic sacroiliac joint pain (Bilateral) (R>L) 06/11/2015  . Lumbar facet hypertrophy (Mult[place]) 06/11/2015  . Lumbosacral radiculopathy at S1 (right-sided) 06/11/2015  . Encounter for therapeutic drug level monitoring 06/03/2015  . Long term current use of opiate analgesic 06/03/2015  . Uncomplicated opioid dependence ([place]) 06/03/2015  . Opiate use 06/03/2015  . Lumbar spondylosis 06/03/2015  . Lumbar facet syndrome (Bilateral) (R>L) 06/03/2015  . Insomnia, persistent 06/03/2015  . Intermittent muscle cramps 06/03/2015  . Always thirsty 06/03/2015  . Avitaminosis D 06/03/2015  . Lumbar spine pain 06/03/2015  . Hip arthrosis 06/03/2015  . Class 2 severe obesity due to excess calories with serious comorbidity and body mass index (BMI) of 38.0 to 38.9 in adult ([place]) 06/03/2015  . DDD (degenerative disc disease), lumbar 06/03/2015  . L4-L5 disc bulge 06/03/2015  . Ligamentum flavum hypertrophy 06/03/2015  . Bulging lumbar disc 06/03/2015  . Sleep apnea 06/03/2015  . Dyslipidemia 03/01/2015  . Dehydration symptoms 01/28/2015  . Essential hypertension 07/20/2014  . Chest discomfort 07/20/2014  . Hypothyroidism 07/20/2014  . Adrenal insufficiency ([place]) 07/20/2014       Current Outpatient Medications:  .  carvedilol (COREG) 6.25 MG tablet, TAKE 1 TABLET BY MOUTH TWO TIMES DAILY, Disp: 180 tablet, Rfl: 3 .  fludrocortisone (FLORINEF) 0.1 MG tablet, Take 0.1 mg by mouth daily as needed for dizziness., Disp: , Rfl:  .  hydroxychloroquine (PLAQUENIL) 200 MG tablet, Take 1 tablet by mouth daily., Disp: , Rfl: 3 .  levothyroxine (SYNTHROID, LEVOTHROID) 100 MCG tablet, Take 1  tablet by mouth daily., Disp: , Rfl: 3 .  olmesartan (BENICAR) 40 MG tablet, TAKE 1 TABLET BY MOUTH ONCE DAILY, Disp: 90 tablet, Rfl: 3 .  predniSONE (DELTASONE) 5 MG tablet, Take 5 mg by mouth daily with breakfast. take 1 1/2 (7.5mg ) qd, Disp: , Rfl:  .  sildenafil (VIAGRA) 100 MG tablet, Take 1 tablet by mouth daily as needed., Disp: , Rfl: 12 .  testosterone (ANDROGEL) 50 MG/5GM (1%) GEL, 1 packet to skin in the morning to shoulder, upper arms or abdomen, Disp: , Rfl:  .  Vitamin D, Ergocalciferol, (DRISDOL) 50000 units CAPS capsule, Take 50,000 Units by mouth once a week., Disp: , Rfl: 2 .  gabapentin (NEURONTIN) 600 MG tablet, Take 1 tablet (600 mg total) by mouth at bedtime. (Patient not taking: Reported on 07/26/2020), Disp: 90 tablet, Rfl: 0 .  testosterone cypionate (DEPOTESTOTERONE CYPIONATE) 200 MG/ML injection, Inject 100 mg into the muscle every 14 (fourteen) days.  (Patient not taking: Reported on 07/26/2020), Disp: , Rfl:    Allergies  Allergen Reactions  . Borax  Rash    Borax detergent  . Testosterone Rash     Social History   Tobacco Use  . Smoking status: Former Smoker    Quit date: 11/30/1995    Years since quitting: 24.6  . Smokeless tobacco: Former Systems developer    Quit date: 11/30/1995  Vaping Use  . Vaping Use: Never used  Substance Use Topics  . Alcohol use: Yes    Alcohol/week: 0.0 standard drinks    Comment: rarely  . Drug use: No      Chart Review Today: I personally reviewed active problem list, medication list, allergies, family history, social history, health maintenance, notes from last encounter, lab results, imaging with the patient/caregiver today.   Review of Systems 10 Systems reviewed and are negative for acute change except as noted in the HPI.     Objective:   Vitals:   07/26/20 0957  BP: 128/80  Pulse: 79  Resp: 16  Temp: 97.6 F (36.4 C)  TempSrc: Oral  SpO2: 98%  Weight: (!) 309 lb 9.6 oz (140.4 kg)  Height: 6\' 2"  (1.88 m)     Body mass index is 39.75 kg/m.  Physical Exam Vitals and nursing note reviewed.  Constitutional:      General: He is not in acute distress.    Appearance: Normal appearance. He is well-developed. He is obese. He is not ill-appearing, toxic-appearing or diaphoretic.     Interventions: Face mask in place.  HENT:     Head: Normocephalic and atraumatic.     Jaw: There is normal jaw occlusion. No trismus or tenderness.     Salivary Glands: Right salivary gland is not diffusely enlarged or tender. Left salivary gland is not diffusely enlarged.     Right Ear: Hearing, tympanic membrane, ear canal and external ear normal.     Left Ear: Hearing, tympanic membrane, ear canal and external ear normal.     Nose: Mucosal edema, congestion and rhinorrhea present.     Right Nostril: No occlusion.     Left Nostril: No occlusion.     Right Turbinates: Enlarged.     Left Turbinates: Enlarged.     Right Sinus: No maxillary sinus tenderness or frontal sinus tenderness.     Left Sinus: No maxillary sinus tenderness or frontal sinus tenderness.     Mouth/Throat:     Mouth: Mucous membranes are moist.     Pharynx: Oropharynx is clear. Uvula midline. Posterior oropharyngeal erythema present. No pharyngeal swelling, oropharyngeal exudate or uvula swelling.  Eyes:     General: Lids are normal. No scleral icterus.       Right eye: No discharge.        Left eye: No discharge.     Conjunctiva/sclera: Conjunctivae normal.  Neck:     Thyroid: No thyroid mass or thyroid tenderness.     Trachea: Trachea and phonation normal. No tracheal deviation.  Cardiovascular:     Rate and Rhythm: Normal rate and regular rhythm.     Pulses: Normal pulses.          Radial pulses are 2+ on the right side and 2+ on the left side.       Posterior tibial pulses are 2+ on the right side and 2+ on the left side.     Heart sounds: Normal heart sounds. No murmur heard. No friction rub. No gallop.   Pulmonary:     Effort:  Pulmonary effort is normal. No respiratory distress.     Breath sounds: Normal  breath sounds. No stridor. No wheezing, rhonchi or rales.  Abdominal:     General: Bowel sounds are normal. There is no distension.     Palpations: Abdomen is soft.  Musculoskeletal:     Cervical back: Normal range of motion and neck supple. No spinous process tenderness or muscular tenderness. Normal range of motion.     Right lower leg: No edema.     Left lower leg: No edema.  Lymphadenopathy:     Cervical: No cervical adenopathy.  Skin:    General: Skin is warm and dry.     Coloration: Skin is not jaundiced.     Findings: No rash.     Nails: There is no clubbing.  Neurological:     Mental Status: He is alert. Mental status is at baseline.     Cranial Nerves: No dysarthria or facial asymmetry.     Motor: No tremor or abnormal muscle tone.     Gait: Gait normal.  Psychiatric:        Mood and Affect: Mood normal.        Speech: Speech normal.        Behavior: Behavior normal. Behavior is cooperative.      Results for orders placed or performed in visit on 04/02/20  ToxASSURE Select 13 (MW), Urine  Result Value Ref Range   Summary Note        Assessment & Plan:     ICD-10-CM   1. Thyromegaly  E01.0   2. Dysphagia, unspecified type  R13.10 DG ESOPHAGUS W SINGLE CM (SOL OR THIN BA)  3. Uncomplicated opioid dependence (HCC) Chronic F11.20   4. COVID-19 virus infection  U07.1    recent COVID infection, 2 months ago  5. Rheumatoid arthritis, involving unspecified site, unspecified whether rheumatoid factor present ([place])  M06.9   6. Opioid-induced hyperalgesia  R20.8    T40.2X5A   7. Nondiabetic gastroparesis  K31.84   8. Obstructive sleep apnea syndrome  G47.33   9. Class 2 severe obesity due to excess calories with serious comorbidity and body mass index (BMI) of 38.0 to 38.9 in adult (HCC)  E66.01    Z68.38   10. Gastroesophageal reflux disease, unspecified whether esophagitis present  K21.9    11. Mixed dyslipidemia  E78.2   12. Hypothyroidism, unspecified type  E03.9   13. Encounter for medication monitoring  Z51.81   14. Neurogenic pain  M79.2 gabapentin (NEURONTIN) 600 MG tablet   Plan for pt's acute complaints today: I suggest starting medications to help decrease swelling drainage and inflammation in year nose and throat -I sent in cetirizine and Flonase but you can try any over-the-counter or generic second-generation antihistamine like Zyrtec, Claritin, Allegra etc. and try an intranasal steroid 2 sprays each nostril once daily such as Flonase, Nasonex or Nasacort -do these medications daily for at least 1 to 2 weeks and the effect would be gradual.  Also suggest trying to restart a antacids such as Prilosec or trying her pantoprazole again for a few weeks  We can try and do a swallow study see if there is any strictures webs etc  I do not feel any concerning lymph nodes, masses or enlarged thyroid -so I am not going to do any imaging for that right now  Please follow-up if you have anything worsen or enlarge and we may need to get a CT scan of your neck in the future  My suspicion is if you continue to have the sensation or swallowing  problems that I will need to get you to a GI specialist     Patient did not report any food obstructions and mostly describes his symptoms today something stuck in his throat he has not changed what he is eating and he is managing to take his medications without any dysphagia of pills, however he reports this dysphagia problem ongoing for a long time previously seemed very related to his other GI issues, now that his nondiabetic gastroparesis is improving he is noticing this.  With redness in his nose and throat may be from some inflammation from rhinosinusitis or a viral syndrome?  Could possibly be related to acid reflux and indigestion, cannot rule out stricture or web, his trachea is midline, phonation normal, do not feel any  lymphadenopathy, do think that a swallow study would be helpful to screen.    Start tx with PPI and allergy/postnasal drip meds/antihistamine etc -see if symptoms improve, get swallow study and follow-up Discussed also globus sensation which can come and go   Patient patient is very reflective and researches a lot for his symptoms and his diagnoses and he has been very accurate in the past with solving his own symptoms related to his medical issues or medicine side effects.  He is reluctant to commence I do believe his symptoms and that is why I am doing the swallow study today.  As discussed with patient as noted above may need GI referral.    Delsa Grana, PA-C 07/26/20 10:04 AM

## 2020-07-26 NOTE — Patient Instructions (Addendum)
I suggest starting medications to help decrease swelling drainage and inflammation in year nose and throat -I sent in cetirizine and Flonase but you can try any over-the-counter or generic second-generation antihistamine like Zyrtec, Claritin, Allegra etc. and try an intranasal steroid 2 sprays each nostril once daily such as Flonase, Nasonex or Nasacort -do these medications daily for at least 1 to 2 weeks and the effect would be gradual.  Also suggest trying to restart a antacids such as Prilosec or trying her pantoprazole again for a few weeks  We can try and do a swallow study see if there is any strictures webs etc  I do not feel any concerning lymph nodes, masses or enlarged thyroid -so I am not going to do any imaging for that right now  Please follow-up if you have anything worsen or enlarge and we may need to get a CT scan of your neck in the future  My suspicion is if you continue to have the sensation or swallowing problems that I will need to get you to a GI specialist   Dysphagia  Dysphagia is trouble swallowing. This condition occurs when solids and liquids stick in a person's throat on the way down to the stomach, or when food takes longer to get to the stomach than usual. You may have problems swallowing food, liquids, or both. You may also have pain while trying to swallow. It may take you more time and effort to swallow something. What are the causes? This condition may be caused by:  Muscle problems. They may make it difficult for you to move food and liquids through the esophagus, which is the tube that connects your mouth to your stomach.  Blockages. You may have ulcers, scar tissue, or inflammation that blocks the normal passage of food and liquids. Causes of these problems include: ? Acid reflux from your stomach into your esophagus (gastroesophageal reflux). ? Infections. ? Radiation treatment for cancer. ? Medicines taken without enough fluids to wash them down into  your stomach.  Stroke. This can affect the nerves and make it difficult to swallow.  Nerve problems. These prevent signals from being sent to the muscles of your esophagus to squeeze (contract) and move what you swallow down to your stomach.  Globus pharyngeus. This is a common problem that involves a feeling like something is stuck in your throat or a sense of trouble with swallowing, even though nothing is wrong with the swallowing passages.  Certain conditions, such as cerebral palsy or Parkinson's disease. What are the signs or symptoms? Common symptoms of this condition include:  A feeling that solids or liquids are stuck in your throat on the way down to the stomach.  Pain while swallowing.  Coughing or gagging while trying to swallow. Other symptoms include:  Food moving back from your stomach to your mouth (regurgitation).  Noises coming from your throat.  Chest discomfort with swallowing.  A feeling of fullness when swallowing.  Drooling, especially when the throat is blocked.  Heartburn. How is this diagnosed? This condition may be diagnosed by:  Barium X-ray. In this test, you will swallow a white liquid that sticks to the inside of your esophagus. X-ray images are then taken.  Endoscopy. In this test, a flexible telescope is inserted down your throat to look at your esophagus and your stomach.  CT scans and an MRI. How is this treated? Treatment for dysphagia depends on the cause of this condition, such as:  If the dysphagia is  caused by acid reflux or infection, medicines may be used. They may include antibiotics and heartburn medicines.  If the dysphagia is caused by problems with the muscles, swallowing therapy may be used to help you strengthen your swallowing muscles. You may have to do specific exercises to strengthen the muscles or stretch them.  If the dysphagia is caused by a blockage or mass, procedures to remove the blockage may be done. You may  need surgery and a feeding tube. You may need to make diet changes. Ask your health care provider for specific instructions. Follow these instructions at home: Medicines  Take over-the-counter and prescription medicines only as told by your health care provider.  If you were prescribed an antibiotic medicine, take it as told by your health care provider. Do not stop taking the antibiotic even if you start to feel better. Eating and drinking   Follow any diet changes as told by your health care provider.  Work with a diet and nutrition specialist (dietitian) to create an eating plan that will help you get the nutrients you need in order to stay healthy.  Eat soft foods that are easier to swallow.  Cut your food into small pieces and eat slowly. Take small bites.  Eat and drink only when you are sitting upright.  Do not drink alcohol or caffeine. If you need help quitting, ask your health care provider. General instructions  Check your weight every day to make sure you are not losing weight.  Do not use any products that contain nicotine or tobacco, such as cigarettes, e-cigarettes, and chewing tobacco. If you need help quitting, ask your health care provider.  Keep all follow-up visits as told by your health care provider. This is important. Contact a health care provider if you:  Lose weight because you cannot swallow.  Cough when you drink liquids.  Cough up partially digested food. Get help right away if you:  Cannot swallow your saliva.  Have shortness of breath, a fever, or both.  Have a hoarse voice and also have trouble swallowing. Summary  Dysphagia is trouble swallowing. This condition occurs when solids and liquids stick in a person's throat on the way down to the stomach. You may cough or gag while trying to swallow.  Dysphagia has many possible causes.  Treatment for dysphagia depends on the cause of the condition.  Keep all follow-up visits as told by  your health care provider. This is important. This information is not intended to replace advice given to you by your health care provider. Make sure you discuss any questions you have with your health care provider. Document Revised: 12/28/2018 Document Reviewed: 12/28/2018 Elsevier Patient Education  [place].

## 2020-08-02 ENCOUNTER — Other Ambulatory Visit: Payer: Self-pay

## 2020-08-02 ENCOUNTER — Ambulatory Visit
Admission: RE | Admit: 2020-08-02 | Discharge: 2020-08-02 | Disposition: A | Discharge status: Home / Self Care | Payer: No Typology Code available for payment source | Source: Ambulatory Visit | Attending: Family Medicine | Admitting: Family Medicine

## 2020-08-02 DIAGNOSIS — R1310 Dysphagia, unspecified: Secondary | ICD-10-CM | POA: Diagnosis present

## 2020-08-02 NOTE — Addendum Note (Signed)
Addended by: Delsa Grana on: 08/02/2020 06:56 PM   Modules accepted: Orders

## 2020-08-02 NOTE — Progress Notes (Signed)
Reviewed results with radiologist - called pt personally and directly tonight Discussed results with pt, the risk of obstruction or choking and due to proximity to airway with the upper esophageal sphincter encouraged him to follow the advice per radiology to do a liquid diet, and he will be urgently referred to [facility] will send a message directly to Dr. Vicente Males to see if they are able to get him in for an upper endoscopy reviewed possible etiologies of spasm versus stricture, patient continues to feel constantly like he has something stuck in his throat but he is not having any dysphagia of his pills solids or liquids no regurgitation He does continue to feel anxious about that persistent feeling in his throat He has treated his rhinosinusitis and an started Prilosec over the past week since his office visit but he has not had any change in his symptoms.  Reviewed images and report: 1. 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 Findings may represent upper esophageal sphincter abnormality or stricture. Suggest endoscopic assessment to exclude lesion in this area though smooth narrowing is reassuring. 2. Otherwise unremarkable esophagram.   Contacted GI/Dr. Vicente Males, referral coordinator, patient and CMA/SP team in office to help coordinate getting pt into GI appt asap next week

## 2020-08-05 ENCOUNTER — Other Ambulatory Visit: Payer: Self-pay | Admitting: Family Medicine

## 2020-08-05 DIAGNOSIS — E010 Iodine-deficiency related diffuse (endemic) goiter: Secondary | ICD-10-CM

## 2020-08-05 DIAGNOSIS — R1312 Dysphagia, oropharyngeal phase: Secondary | ICD-10-CM

## 2020-08-05 DIAGNOSIS — R933 Abnormal findings on diagnostic imaging of other parts of digestive tract: Secondary | ICD-10-CM

## 2020-08-06 ENCOUNTER — Telehealth: Payer: Self-pay | Admitting: Gastroenterology

## 2020-08-06 NOTE — Telephone Encounter (Signed)
This patient needs to be set up for an EGD for a structure.

## 2020-08-06 NOTE — Telephone Encounter (Signed)
PA called on pts behalf.  PA said that Barium Swallow showed narrowing of upper Espohageal Sphincter. Pt was sent from GI to ENT and they are sending them back for dialation, PA wants to know if you can just take back to OR for Dialation.

## 2020-08-07 ENCOUNTER — Other Ambulatory Visit: Payer: Self-pay

## 2020-08-07 DIAGNOSIS — K222 Esophageal obstruction: Secondary | ICD-10-CM

## 2020-08-07 NOTE — Telephone Encounter (Signed)
Called patient and got patient scheduled for a EGD on 08/15/2020 with Dr. Allen Norris. Went over instructions with patient and sent them to mychart. He verbalized understanding

## 2020-08-12 ENCOUNTER — Telehealth: Payer: Self-pay

## 2020-08-12 NOTE — Telephone Encounter (Signed)
Order for Dr.Anna was put in 12/17

## 2020-08-12 NOTE — Telephone Encounter (Signed)
Copied from CRM [id number]. Topic: General - Other >> Aug 08, 2020  2:28 PM Jaquita Rector A wrote: Reason for CRM: Patient called in to inform Danelle Berry that he was referred to a GI specialist will be having a procedure on 08/15/20 and need referral sent to [facility]. Please call with questions Ph# [phone]

## 2020-08-13 ENCOUNTER — Other Ambulatory Visit
Admission: RE | Admit: 2020-08-13 | Discharge: 2020-08-13 | Disposition: A | Discharge status: Home / Self Care | Payer: No Typology Code available for payment source | Source: Ambulatory Visit | Attending: Gastroenterology | Admitting: Gastroenterology

## 2020-08-13 ENCOUNTER — Telehealth: Payer: Self-pay | Admitting: Gastroenterology

## 2020-08-13 ENCOUNTER — Other Ambulatory Visit: Payer: Self-pay

## 2020-08-13 DIAGNOSIS — Z20822 Contact with and (suspected) exposure to covid-19: Secondary | ICD-10-CM | POA: Insufficient documentation

## 2020-08-13 DIAGNOSIS — Z01812 Encounter for preprocedural laboratory examination: Secondary | ICD-10-CM | POA: Diagnosis not present

## 2020-08-13 NOTE — Telephone Encounter (Signed)
Made in error

## 2020-08-14 ENCOUNTER — Encounter: Payer: Self-pay | Admitting: Gastroenterology

## 2020-08-14 LAB — SARS CORONAVIRUS 2 (TAT 6-24 HRS): SARS Coronavirus 2: NEGATIVE

## 2020-08-15 ENCOUNTER — Ambulatory Visit: Payer: No Typology Code available for payment source | Admitting: Anesthesiology

## 2020-08-15 ENCOUNTER — Encounter: Payer: Self-pay | Admitting: Gastroenterology

## 2020-08-15 ENCOUNTER — Encounter
Admission: RE | Disposition: A | Discharge status: Home / Self Care | Payer: Self-pay | Source: Home / Self Care | Attending: Gastroenterology

## 2020-08-15 ENCOUNTER — Other Ambulatory Visit: Payer: Self-pay

## 2020-08-15 ENCOUNTER — Ambulatory Visit
Admission: RE | Admit: 2020-08-15 | Discharge: 2020-08-15 | Disposition: A | Discharge status: Home / Self Care | Payer: No Typology Code available for payment source | Attending: Gastroenterology | Admitting: Gastroenterology

## 2020-08-15 DIAGNOSIS — Z79890 Hormone replacement therapy (postmenopausal): Secondary | ICD-10-CM | POA: Diagnosis not present

## 2020-08-15 DIAGNOSIS — Z87891 Personal history of nicotine dependence: Secondary | ICD-10-CM | POA: Diagnosis not present

## 2020-08-15 DIAGNOSIS — Z7952 Long term (current) use of systemic steroids: Secondary | ICD-10-CM | POA: Insufficient documentation

## 2020-08-15 DIAGNOSIS — K319 Disease of stomach and duodenum, unspecified: Secondary | ICD-10-CM | POA: Insufficient documentation

## 2020-08-15 DIAGNOSIS — R1310 Dysphagia, unspecified: Secondary | ICD-10-CM | POA: Diagnosis not present

## 2020-08-15 DIAGNOSIS — K2970 Gastritis, unspecified, without bleeding: Secondary | ICD-10-CM

## 2020-08-15 DIAGNOSIS — Z79899 Other long term (current) drug therapy: Secondary | ICD-10-CM | POA: Diagnosis not present

## 2020-08-15 DIAGNOSIS — K222 Esophageal obstruction: Secondary | ICD-10-CM

## 2020-08-15 HISTORY — PX: ESOPHAGOGASTRODUODENOSCOPY (EGD) WITH PROPOFOL: SHX5813

## 2020-08-15 SURGERY — ESOPHAGOGASTRODUODENOSCOPY (EGD) WITH PROPOFOL
Anesthesia: General

## 2020-08-15 MED ORDER — PROPOFOL 500 MG/50ML IV EMUL
INTRAVENOUS | Status: AC
  Filled 2020-08-15: qty 50

## 2020-08-15 MED ORDER — LIDOCAINE HCL (PF) 2 % IJ SOLN
INTRAMUSCULAR | Status: AC
  Filled 2020-08-15: qty 5

## 2020-08-15 MED ORDER — SODIUM CHLORIDE 0.9 % IV SOLN: INTRAVENOUS | Status: DC

## 2020-08-15 MED ORDER — LIDOCAINE HCL (CARDIAC) PF 100 MG/5ML IV SOSY
PREFILLED_SYRINGE | INTRAVENOUS | Status: DC | PRN
  Administered 2020-08-15: 100 mg via INTRAVENOUS

## 2020-08-15 MED ORDER — PROPOFOL 500 MG/50ML IV EMUL
INTRAVENOUS | Status: DC | PRN
  Administered 2020-08-15: 150 ug/kg/min via INTRAVENOUS

## 2020-08-15 NOTE — Anesthesia Preprocedure Evaluation (Signed)
Anesthesia Evaluation  Patient identified by MRN, date of birth, ID band Patient awake    Reviewed: Allergy & Precautions, H&P , NPO status , Patient's Chart, lab work & pertinent test results, reviewed documented beta blocker date and time   Airway Mallampati: II   Neck ROM: full    Dental  (+) Poor Dentition   Pulmonary neg shortness of breath, sleep apnea and Continuous Positive Airway Pressure Ventilation , former smoker,    Pulmonary exam normal        Cardiovascular Exercise Tolerance: Poor hypertension, On Medications negative cardio ROS Normal cardiovascular exam Rhythm:regular Rate:Normal     Neuro/Psych PSYCHIATRIC DISORDERS Anxiety  Neuromuscular disease    GI/Hepatic Neg liver ROS, GERD  Medicated,  Endo/Other  Hypothyroidism Adrenal insufficiency  Renal/GU negative Renal ROS  negative genitourinary   Musculoskeletal   Abdominal   Peds  Hematology negative hematology ROS (+)   Anesthesia Other Findings Past Medical History: 05/26/2016: Acute lumbar radiculopathy (Right) (S1) 05/04/2017: Acute myofascial pain 06/22/2019: Acute postoperative pain No date: Adrenal insufficiency (HCC) No date: Anxiety 01/28/2015: Anxiety disorder No date: Arthritis 06/03/2015: Back pain, chronic No date: Hypertension No date: Hypothyroid No date: Sleep apnea No date: Thyroid disease Past Surgical History: No date: BACK SURGERY 02/23/2019: COLONOSCOPY WITH PROPOFOL; N/A     Comment:  Procedure: COLONOSCOPY WITH PROPOFOL;  Surgeon:               Pasty Spillers, MD;  Location: [facility] ENDOSCOPY;                Service: Endoscopy;  Laterality: N/A; 2017, 2018, 2019: discectomy; N/A     Comment:  x3  L5-S1 No date: ELBOW SURGERY     Comment:  right elbow BMI    Body Mass Index: 39.16 kg/m     Reproductive/Obstetrics negative OB ROS                             Anesthesia  Physical Anesthesia Plan  ASA: III  Anesthesia Plan: General   Post-op Pain Management:    Induction:   PONV Risk Score and Plan:   Airway Management Planned:   Additional Equipment:   Intra-op Plan:   Post-operative Plan:   Informed Consent: I have reviewed the patients History and Physical, chart, labs and discussed the procedure including the risks, benefits and alternatives for the proposed anesthesia with the patient or authorized representative who has indicated his/her understanding and acceptance.     Dental Advisory Given  Plan Discussed with: CRNA  Anesthesia Plan Comments:         Anesthesia Quick Evaluation

## 2020-08-15 NOTE — Op Note (Signed)
[facility] Patient Name: Dean Ford Procedure Date: 08/15/2020 8:34 AM MRN: [id number] Account #: [id number] Date of Birth: 1975/11/17 Admit Type: Outpatient Age: 44 Room: [facility] ENDO ROOM 4 Gender: Male Note Status: Finalized Procedure:             Upper GI endoscopy Indications:           Dysphagia Providers:             Lucilla Lame MD, MD Referring MD:          Delsa Grana (Referring MD) Medicines:             Propofol per Anesthesia Complications:         No immediate complications. Procedure:             Pre-Anesthesia Assessment:                        - Prior to the procedure, a History and Physical was                         performed, and patient medications and allergies were                         reviewed. The patient's tolerance of previous                         anesthesia was also reviewed. The risks and benefits                         of the procedure and the sedation options and risks                         were discussed with the patient. All questions were                         answered, and informed consent was obtained. Prior                         Anticoagulants: The patient has taken no previous                         anticoagulant or antiplatelet agents. ASA Grade                         Assessment: II - A patient with mild systemic disease.                         After reviewing the risks and benefits, the patient                         was deemed in satisfactory condition to undergo the                         procedure.                        After obtaining informed consent, the endoscope was  passed under direct vision. Throughout the procedure,                         the patient's blood pressure, pulse, and oxygen                         saturations were monitored continuously. The Endoscope                         was introduced through the mouth, and advanced to the                          second part of duodenum. The upper GI endoscopy was                         accomplished without difficulty. The patient tolerated                         the procedure well. Findings:      One benign-appearing, intrinsic mild stenosis was found in the proximal       esophagus. The stenosis was traversed. A TTS dilator was passed through       the scope. Dilation with a 15-16.5-18 mm balloon dilator was performed       to 18 mm. The dilation site was examined following endoscope reinsertion       and showed complete resolution of luminal narrowing.      Diffuse mild inflammation characterized by erythema was found in the       gastric antrum. Biopsies were taken with a cold forceps for histology.      The examined duodenum was normal. Impression:            - Benign-appearing esophageal stenosis. Dilated.                        - Gastritis. Biopsied.                        - Normal examined duodenum. Recommendation:        - Discharge patient to home.                        - Resume previous diet.                        - Continue present medications.                        - Await pathology results. Procedure Code(s):     --- Professional ---                        [phone], Esophagogastroduodenoscopy, flexible,                         transoral; with transendoscopic balloon dilation of                         esophagus (less than 30 mm diameter)                        43239, 59, Esophagogastroduodenoscopy, flexible,  transoral; with biopsy, single or multiple Diagnosis Code(s):     --- Professional ---                        R13.10, Dysphagia, unspecified                        K29.70, Gastritis, unspecified, without bleeding                        K22.2, Esophageal obstruction CPT copyright 2019 American Medical Association. All rights reserved. The codes documented in this report are preliminary and upon coder review may  be revised to meet current  compliance requirements. Lucilla Lame MD, MD 08/15/2020 8:46:00 AM This report has been signed electronically. Number of Addenda: 0 Note Initiated On: 08/15/2020 8:34 AM Estimated Blood Loss:  Estimated blood loss: none.      [facility]

## 2020-08-15 NOTE — H&P (Signed)
Dean Minium, MD [facility] [address], [address] [place], [place] [address] Phone:[phone] Fax : [phone]  Primary Care Physician:  Danelle Berry, PA-C Primary Gastroenterologist:  Dr. Servando Snare  Pre-Procedure History & Physical: HPI:  Dean Ford is a 44 y.o. male is here for an endoscopy.   Past Medical History:  Diagnosis Date  . Acute lumbar radiculopathy (Right) (S1) 05/26/2016  . Acute myofascial pain 05/04/2017  . Acute postoperative pain 06/22/2019  . Adrenal insufficiency (HCC)   . Anxiety   . Anxiety disorder 01/28/2015  . Arthritis   . Back pain, chronic 06/03/2015  . Hypertension   . Hypothyroid   . Sleep apnea   . Thyroid disease     Past Surgical History:  Procedure Laterality Date  . BACK SURGERY    . COLONOSCOPY WITH PROPOFOL N/A 02/23/2019   Procedure: COLONOSCOPY WITH PROPOFOL;  Surgeon: Pasty Spillers, MD;  Location: [facility] ENDOSCOPY;  Service: Endoscopy;  Laterality: N/A;  . discectomy N/A 2017, 2018, 2019   x3  L5-S1  . ELBOW SURGERY     right elbow    Prior to Admission medications   Medication Sig Start Date End Date Taking? Authorizing Provider  carvedilol (COREG) 6.25 MG tablet TAKE 1 TABLET BY MOUTH TWO TIMES DAILY 10/31/19  Yes Nahser, Deloris Ping, MD  fluticasone (FLONASE) 50 MCG/ACT nasal spray Place 2 sprays into both nostrils daily. 07/26/20  Yes Danelle Berry, PA-C  hydroxychloroquine (PLAQUENIL) 200 MG tablet Take 1 tablet by mouth daily. 02/15/18  Yes [provider]  levothyroxine (SYNTHROID, LEVOTHROID) 100 MCG tablet Take 1 tablet by mouth daily. 02/09/18  Yes [provider]  olmesartan (BENICAR) 40 MG tablet TAKE 1 TABLET BY MOUTH ONCE DAILY 07/17/20  Yes Nahser, Deloris Ping, MD  predniSONE (DELTASONE) 5 MG tablet Take 5 mg by mouth daily with breakfast. take 1 1/2 (7.5mg ) qd   Yes [provider]  testosterone (ANDROGEL) 50 MG/5GM (1%) GEL 1 packet to skin in the morning to shoulder, upper arms or abdomen 07/05/20   Yes [provider]  cetirizine (ZYRTEC) 10 MG tablet Take 1 tablet (10 mg total) by mouth daily. Patient not taking: Reported on 08/15/2020 07/26/20   Danelle Berry, PA-C  fludrocortisone (FLORINEF) 0.1 MG tablet Take 0.1 mg by mouth daily as needed for dizziness.    [provider]  gabapentin (NEURONTIN) 600 MG tablet Take 1 tablet (600 mg total) by mouth at bedtime. Patient not taking: Reported on 08/15/2020 07/26/20 10/24/20  Danelle Berry, PA-C  sildenafil (VIAGRA) 100 MG tablet Take 1 tablet by mouth daily as needed. 02/09/18   [provider]  testosterone cypionate (DEPOTESTOTERONE CYPIONATE) 200 MG/ML injection Inject 100 mg into the muscle every 14 (fourteen) days.  Patient not taking: Reported on 07/26/2020    [provider]  Vitamin D, Ergocalciferol, (DRISDOL) 50000 units CAPS capsule Take 50,000 Units by mouth once a week. Patient not taking: Reported on 08/15/2020 01/06/18   [provider]  Oxycodone HCl 10 MG TABS Take 1 tablet (10 mg total) by mouth every 6 (six) hours as needed. Must last 30 days. 04/25/20 05/22/20  Edward Jolly, MD    Allergies as of 08/07/2020 - Review Complete 07/26/2020  Allergen Reaction Noted  . Borax Rash 06/03/2015  . Testosterone Rash 07/05/2020    Family History  Problem Relation Age of Onset  . Osteoarthritis Mother   . Heart attack Father 95  . Hypertension Father   . Hypertension Sister  Social History   Socioeconomic History  . Marital status: Married    Spouse name: Stephaine  . Number of children: 1  . Years of education: Not on file  . Highest education level: Not on file  Occupational History  . Not on file  Tobacco Use  . Smoking status: Former Smoker    Quit date: 11/30/1995    Years since quitting: 24.7  . Smokeless tobacco: Former Systems developer    Quit date: 11/30/1995  Vaping Use  . Vaping Use: Never used  Substance and Sexual Activity  . Alcohol use: Yes    Alcohol/week: 0.0  standard drinks    Comment: rarely  . Drug use: No  . Sexual activity: Yes    Partners: Female  Other Topics Concern  . Not on file  Social History Narrative  . Not on file   Social Determinants of Health   Financial Resource Strain: Not on file  Food Insecurity: Not on file  Transportation Needs: Not on file  Physical Activity: Not on file  Stress: Not on file  Social Connections: Not on file  Intimate Partner Violence: Not on file    Review of Systems: See HPI, otherwise negative ROS  Physical Exam: BP (!) 171/96   Pulse 74   Temp (!) 97.1 F (36.2 C) (Temporal)   Resp 18   Ht 6\' 2"  (1.88 m)   Wt (!) 138.3 kg   SpO2 99%   BMI 39.16 kg/m  General:   Alert,  pleasant and cooperative in NAD Head:  Normocephalic and atraumatic. Neck:  Supple; no masses or thyromegaly. Lungs:  Clear throughout to auscultation.    Heart:  Regular rate and rhythm. Abdomen:  Soft, nontender and nondistended. Normal bowel sounds, without guarding, and without rebound.   Neurologic:  Alert and  oriented x4;  grossly normal neurologically.  Impression/Plan: Dean Ford is here for an endoscopy to be performed for dysphagia  Risks, benefits, limitations, and alternatives regarding  endoscopy have been reviewed with the patient.  Questions have been answered.  All parties agreeable.   Lucilla Lame, MD  08/15/2020, 7:57 AM

## 2020-08-15 NOTE — Transfer of Care (Signed)
Immediate Anesthesia Transfer of Care Note  Patient: Dean Ford  Procedure(s) Performed: ESOPHAGOGASTRODUODENOSCOPY (EGD) WITH PROPOFOL (N/A )  Patient Location: PACU  Anesthesia Type:General  Level of Consciousness: awake and sedated  Airway & Oxygen Therapy: Patient Spontanous Breathing and Patient connected to face mask oxygen  Post-op Assessment: Report given to RN and Post -op Vital signs reviewed and stable  Post vital signs: Reviewed and stable  Last Vitals:  Vitals Value Taken Time  BP    Temp    Pulse 76 08/15/20 0850  Resp 18 08/15/20 0850  SpO2 95 % 08/15/20 0850  Vitals shown include unvalidated device data.  Last Pain:  Vitals:   08/15/20 0744  TempSrc: Temporal  PainSc: 4          Complications: No complications documented.

## 2020-08-15 NOTE — Anesthesia Procedure Notes (Signed)
Performed by: Cook-Martin, Hailei Besser Pre-anesthesia Checklist: Patient identified, Emergency Drugs available, Suction available, Patient being monitored and Timeout performed Patient Re-evaluated:Patient Re-evaluated prior to induction Oxygen Delivery Method: Supernova nasal CPAP Preoxygenation: Pre-oxygenation with 100% oxygen Induction Type: IV induction Airway Equipment and Method: Bite block Placement Confirmation: positive ETCO2 and CO2 detector       

## 2020-08-19 ENCOUNTER — Encounter: Payer: Self-pay | Admitting: Gastroenterology

## 2020-08-19 LAB — SURGICAL PATHOLOGY

## 2020-08-22 NOTE — Anesthesia Postprocedure Evaluation (Signed)
Anesthesia Post Note  Patient: Hensley Treat Soja  Procedure(s) Performed: ESOPHAGOGASTRODUODENOSCOPY (EGD) WITH PROPOFOL (N/A )  Patient location during evaluation: PACU Anesthesia Type: General Level of consciousness: awake and alert Pain management: pain level controlled Vital Signs Assessment: post-procedure vital signs reviewed and stable Respiratory status: spontaneous breathing, nonlabored ventilation, respiratory function stable and patient connected to nasal cannula oxygen Cardiovascular status: blood pressure returned to baseline and stable Postop Assessment: no apparent nausea or vomiting Anesthetic complications: no   No complications documented.   Last Vitals:  Vitals:   08/15/20 0909 08/15/20 0917  BP: 135/88 133/84  Pulse: 67 67  Resp: (!) 6 12  Temp:    SpO2: 93% 95%    Last Pain:  Vitals:   08/16/20 1359  TempSrc:   PainSc: 0-No pain                 Yevette Edwards

## 2020-10-01 ENCOUNTER — Encounter: Payer: Self-pay | Admitting: *Deleted

## 2020-10-01 ENCOUNTER — Ambulatory Visit: Payer: No Typology Code available for payment source | Admitting: Gastroenterology

## 2020-10-01 NOTE — Progress Notes (Deleted)
Primary Care Physician: Delsa Grana, PA-C  Primary Gastroenterologist:  Dr. Lucilla Lame  No chief complaint on file.   HPI: Dean Ford is a 45 y.o. male here For follow-up after having a upper endoscopy back in December.  At that time the patient had an upper endoscopy with dilation due to a finding of:  IMPRESSION: 1. 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 Findings may represent upper esophageal sphincter abnormality or stricture. Suggest endoscopic assessment to exclude lesion in this area though smooth narrowing is reassuring. 2. Otherwise unremarkable esophagram.  The patient had an EGD showing a benign stricture that was dilated with an 18 mm balloon.  Past Medical History:  Diagnosis Date  . Acute lumbar radiculopathy (Right) (S1) 05/26/2016  . Acute myofascial pain 05/04/2017  . Acute postoperative pain 06/22/2019  . Adrenal insufficiency ([place])   . Anxiety   . Anxiety disorder 01/28/2015  . Arthritis   . Back pain, chronic 06/03/2015  . Hypertension   . Hypothyroid   . Sleep apnea   . Thyroid disease     Current Outpatient Medications  Medication Sig Dispense Refill  . carvedilol (COREG) 6.25 MG tablet TAKE 1 TABLET BY MOUTH TWO TIMES DAILY 180 tablet 3  . cetirizine (ZYRTEC) 10 MG tablet Take 1 tablet (10 mg total) by mouth daily. (Patient not taking: Reported on 08/15/2020) 30 tablet 11  . fludrocortisone (FLORINEF) 0.1 MG tablet Take 0.1 mg by mouth daily as needed for dizziness.    . fluticasone (FLONASE) 50 MCG/ACT nasal spray Place 2 sprays into both nostrils daily. 16 g 6  . gabapentin (NEURONTIN) 600 MG tablet Take 1 tablet (600 mg total) by mouth at bedtime. (Patient not taking: Reported on 08/15/2020) 90 tablet 1  . hydroxychloroquine (PLAQUENIL) 200 MG tablet Take 1 tablet by mouth daily.  3  . levothyroxine (SYNTHROID, LEVOTHROID) 100 MCG tablet Take 1 tablet by mouth daily.  3  . olmesartan (BENICAR) 40 MG tablet  TAKE 1 TABLET BY MOUTH ONCE DAILY 90 tablet 3  . predniSONE (DELTASONE) 5 MG tablet Take 5 mg by mouth daily with breakfast. take 1 1/2 (7.5mg ) qd    . sildenafil (VIAGRA) 100 MG tablet Take 1 tablet by mouth daily as needed.  12  . testosterone (ANDROGEL) 50 MG/5GM (1%) GEL 1 packet to skin in the morning to shoulder, upper arms or abdomen    . testosterone cypionate (DEPOTESTOTERONE CYPIONATE) 200 MG/ML injection Inject 100 mg into the muscle every 14 (fourteen) days.  (Patient not taking: Reported on 07/26/2020)    . Vitamin D, Ergocalciferol, (DRISDOL) 50000 units CAPS capsule Take 50,000 Units by mouth once a week. (Patient not taking: Reported on 08/15/2020)  2   No current facility-administered medications for this visit.    Allergies as of 10/01/2020 - Review Complete 08/15/2020  Allergen Reaction Noted  . Borax Rash 06/03/2015  . Testosterone Rash 07/05/2020    ROS:  General: Negative for anorexia, weight loss, fever, chills, fatigue, weakness. ENT: Negative for hoarseness, difficulty swallowing , nasal congestion. CV: Negative for chest pain, angina, palpitations, dyspnea on exertion, peripheral edema.  Respiratory: Negative for dyspnea at rest, dyspnea on exertion, cough, sputum, wheezing.  GI: See history of present illness. GU:  Negative for dysuria, hematuria, urinary incontinence, urinary frequency, nocturnal urination.  Endo: Negative for unusual weight change.    Physical Examination:   There were no vitals taken for this visit.  General: Well-nourished, well-developed in no acute distress.  Eyes: No icterus. Conjunctivae pink. Lungs: Clear to auscultation bilaterally. Non-labored. Heart: Regular rate and rhythm, no murmurs rubs or gallops.  Abdomen: Bowel sounds are normal, nontender, nondistended, no hepatosplenomegaly or masses, no abdominal bruits or hernia , no rebound or guarding.   Extremities: No lower extremity edema. No clubbing or deformities. Neuro:  Alert and oriented x 3.  Grossly intact. Skin: Warm and dry, no jaundice.   Psych: Alert and cooperative, normal mood and affect.  Labs:    Imaging Studies: No results found.  Assessment and Plan:   [name] is a 45 y.o. y/o male ***     Lucilla Lame, MD. Marval Regal    Note: This dictation was prepared with Dragon dictation along with smaller phrase technology. Any transcriptional errors that result from this process are unintentional.

## 2020-10-03 ENCOUNTER — Other Ambulatory Visit: Payer: Self-pay | Admitting: Endocrinology

## 2020-11-05 ENCOUNTER — Other Ambulatory Visit: Payer: Self-pay | Admitting: Otolaryngology

## 2020-11-07 ENCOUNTER — Other Ambulatory Visit: Payer: Self-pay | Admitting: Endocrinology

## 2020-11-08 ENCOUNTER — Other Ambulatory Visit: Payer: Self-pay | Admitting: Family Medicine

## 2020-11-11 ENCOUNTER — Telehealth: Payer: Self-pay | Admitting: Family Medicine

## 2020-11-11 ENCOUNTER — Other Ambulatory Visit: Payer: Self-pay | Admitting: Family Medicine

## 2020-11-11 NOTE — Telephone Encounter (Signed)
Sandy calling from Dr. Magnus Sinning is calling regarding a fax that was sent on 11/06/20 regarding changing the pt olmesartan. Please advise Cb- [phone]

## 2020-11-11 NOTE — Telephone Encounter (Signed)
What did you decide on this?

## 2020-11-12 NOTE — Telephone Encounter (Signed)
Dr. Milas Hock office notified to contact prescriber

## 2020-11-20 ENCOUNTER — Telehealth: Payer: Self-pay | Admitting: Cardiovascular Disease

## 2020-11-20 NOTE — Telephone Encounter (Signed)
Pt c/o medication issue:  1. Name of Medication: olmesartan (BENICAR) 40 MG tablet  2. How are you currently taking this medication (dosage and times per day)? As directed  3. Are you having a reaction (difficulty breathing--STAT)?   4. What is your medication issue? Dr. Darien Ramus office wanted to know if Dr. Acie Fredrickson would be OK with the patient holding this medication for 4-6 weeks or stopping the medication entirely due to some angio edema. Please let Dr. Darien Ramus office know what the patient should do.

## 2020-11-20 NOTE — Telephone Encounter (Signed)
RN returned call to [facility] to update on Dr. Elmarie Shiley recommendation for holding olmesartan. RN left detailed message on Sandy's office mailbox with instructions to return call with any questions.

## 2020-11-20 NOTE — Telephone Encounter (Signed)
Im fine with him holding his medication to see if his symptoms resolve. Is he keeping a BP log to make sure his BP is staying well controlled.

## 2020-11-28 LAB — HM DIABETES EYE EXAM

## 2020-12-04 ENCOUNTER — Other Ambulatory Visit: Payer: Self-pay

## 2020-12-04 MED FILL — Fludrocortisone Acetate Tab 0.1 MG: ORAL | 90 days supply | Qty: 180 | Fill #0 | Status: AC

## 2020-12-13 ENCOUNTER — Other Ambulatory Visit: Payer: Self-pay

## 2020-12-13 MED FILL — Testosterone TD Gel 50 MG/5GM (1%): TRANSDERMAL | 30 days supply | Qty: 150 | Fill #0 | Status: CN

## 2020-12-13 MED FILL — Hydroxychloroquine Sulfate Tab 200 MG: ORAL | 30 days supply | Qty: 60 | Fill #0 | Status: AC

## 2020-12-19 ENCOUNTER — Other Ambulatory Visit: Payer: Self-pay

## 2020-12-19 ENCOUNTER — Other Ambulatory Visit: Payer: Self-pay | Admitting: Cardiovascular Disease

## 2020-12-19 MED ORDER — PREDNISONE 5 MG PO TABS
ORAL_TABLET | ORAL | 4 refills | Status: DC
  Filled 2020-12-19: qty 270, 90d supply, fill #0
  Filled 2021-03-25: qty 270, 90d supply, fill #1
  Filled 2021-06-25: qty 270, 90d supply, fill #2
  Filled 2021-09-07: qty 270, 90d supply, fill #3
  Filled 2021-12-05: qty 270, 90d supply, fill #4

## 2020-12-19 MED ORDER — CARVEDILOL 6.25 MG PO TABS
ORAL_TABLET | Freq: Two times a day (BID) | ORAL | 1 refills | Status: DC
  Filled 2020-12-19: qty 180, 90d supply, fill #0
  Filled 2021-02-19: qty 180, 90d supply, fill #1

## 2020-12-20 ENCOUNTER — Other Ambulatory Visit: Payer: Self-pay

## 2021-01-06 ENCOUNTER — Other Ambulatory Visit: Payer: Self-pay | Admitting: Endocrinology

## 2021-01-06 DIAGNOSIS — E039 Hypothyroidism, unspecified: Secondary | ICD-10-CM

## 2021-01-15 MED FILL — Hydroxychloroquine Sulfate Tab 200 MG: ORAL | 30 days supply | Qty: 60 | Fill #1 | Status: AC

## 2021-01-15 MED FILL — Levothyroxine Sodium Tab 100 MCG: ORAL | 90 days supply | Qty: 90 | Fill #0 | Status: AC

## 2021-01-16 ENCOUNTER — Other Ambulatory Visit: Payer: Self-pay

## 2021-01-21 ENCOUNTER — Other Ambulatory Visit: Payer: Self-pay

## 2021-01-21 MED FILL — Olmesartan Medoxomil Tab 40 MG: ORAL | 90 days supply | Qty: 90 | Fill #0 | Status: AC

## 2021-01-29 ENCOUNTER — Ambulatory Visit
Admission: RE | Admit: 2021-01-29 | Discharge: 2021-01-29 | Disposition: A | Discharge status: Home / Self Care | Payer: Medicare Other | Source: Ambulatory Visit | Attending: Endocrinology | Admitting: Endocrinology

## 2021-01-29 DIAGNOSIS — E039 Hypothyroidism, unspecified: Secondary | ICD-10-CM

## 2021-01-31 ENCOUNTER — Other Ambulatory Visit: Payer: Self-pay

## 2021-01-31 ENCOUNTER — Other Ambulatory Visit: Payer: Self-pay | Admitting: Family Medicine

## 2021-01-31 DIAGNOSIS — M792 Neuralgia and neuritis, unspecified: Secondary | ICD-10-CM

## 2021-01-31 MED ORDER — GABAPENTIN 600 MG PO TABS
600.0000 mg | ORAL_TABLET | Freq: Every day | ORAL | 1 refills | Status: DC
  Filled 2021-01-31: qty 90, 90d supply, fill #0
  Filled 2021-04-28: qty 90, 90d supply, fill #1

## 2021-01-31 MED FILL — Omeprazole Cap Delayed Release 20 MG: ORAL | 90 days supply | Qty: 90 | Fill #0 | Status: AC

## 2021-02-19 ENCOUNTER — Other Ambulatory Visit: Payer: Self-pay

## 2021-02-19 MED FILL — Hydroxychloroquine Sulfate Tab 200 MG: ORAL | 30 days supply | Qty: 60 | Fill #2 | Status: AC

## 2021-02-20 ENCOUNTER — Other Ambulatory Visit: Payer: Self-pay

## 2021-02-20 MED ORDER — SILDENAFIL CITRATE 100 MG PO TABS
ORAL_TABLET | ORAL | 4 refills | Status: DC
  Filled 2021-02-20: qty 4, 30d supply, fill #0
  Filled 2021-03-25: qty 4, 30d supply, fill #1
  Filled 2021-04-28: qty 4, 30d supply, fill #2
  Filled 2021-06-11: qty 4, 30d supply, fill #3
  Filled 2021-08-04: qty 4, 30d supply, fill #4
  Filled 2021-11-06: qty 4, 30d supply, fill #5
  Filled 2022-01-26: qty 4, 30d supply, fill #6

## 2021-02-25 ENCOUNTER — Other Ambulatory Visit: Payer: Self-pay

## 2021-03-06 ENCOUNTER — Other Ambulatory Visit: Payer: Self-pay

## 2021-03-06 MED ORDER — FLUDROCORTISONE ACETATE 0.1 MG PO TABS
ORAL_TABLET | ORAL | 0 refills | Status: DC
  Filled 2021-03-06: qty 60, 30d supply, fill #0

## 2021-03-25 ENCOUNTER — Other Ambulatory Visit: Payer: Self-pay

## 2021-03-25 MED FILL — Olmesartan Medoxomil Tab 40 MG: ORAL | 90 days supply | Qty: 90 | Fill #1 | Status: AC

## 2021-03-26 ENCOUNTER — Other Ambulatory Visit: Payer: Self-pay

## 2021-03-26 MED ORDER — HYDROXYCHLOROQUINE SULFATE 200 MG PO TABS
ORAL_TABLET | ORAL | 0 refills | Status: DC
  Filled 2021-03-26: qty 60, 30d supply, fill #0

## 2021-03-27 ENCOUNTER — Other Ambulatory Visit: Payer: Self-pay

## 2021-03-27 MED ORDER — TESTOSTERONE 50 MG/5GM (1%) TD GEL
TRANSDERMAL | 5 refills | Status: DC
  Filled 2021-03-27: qty 150, 30d supply, fill #0
  Filled 2021-07-17: qty 150, 30d supply, fill #1

## 2021-03-31 ENCOUNTER — Other Ambulatory Visit: Payer: Self-pay

## 2021-04-11 ENCOUNTER — Other Ambulatory Visit: Payer: Self-pay

## 2021-04-14 ENCOUNTER — Other Ambulatory Visit: Payer: Self-pay

## 2021-04-14 MED ORDER — FLUDROCORTISONE ACETATE 0.1 MG PO TABS
ORAL_TABLET | ORAL | 12 refills | Status: DC
Start: 2021-04-12 — End: 2022-05-08
  Filled 2021-04-14: qty 60, 30d supply, fill #0
  Filled 2021-05-19: qty 60, 30d supply, fill #1
  Filled 2021-06-25: qty 60, 30d supply, fill #2
  Filled 2021-08-04: qty 60, 30d supply, fill #3
  Filled 2021-09-07: qty 60, 30d supply, fill #4
  Filled 2021-09-29: qty 60, 30d supply, fill #5
  Filled 2021-11-06: qty 60, 30d supply, fill #6
  Filled 2021-12-05: qty 60, 30d supply, fill #7
  Filled 2022-01-26: qty 60, 30d supply, fill #8
  Filled 2022-02-27: qty 60, 30d supply, fill #9
  Filled 2022-04-06: qty 60, 30d supply, fill #10

## 2021-04-28 ENCOUNTER — Other Ambulatory Visit: Payer: Self-pay

## 2021-04-28 MED FILL — Levothyroxine Sodium Tab 100 MCG: ORAL | 90 days supply | Qty: 90 | Fill #1 | Status: AC

## 2021-05-05 ENCOUNTER — Other Ambulatory Visit: Payer: Self-pay | Admitting: Cardiovascular Disease

## 2021-05-05 ENCOUNTER — Other Ambulatory Visit: Payer: Self-pay

## 2021-05-05 MED FILL — Carvedilol Tab 6.25 MG: ORAL | 30 days supply | Qty: 60 | Fill #0 | Status: AC

## 2021-05-05 MED FILL — Omeprazole Cap Delayed Release 20 MG: ORAL | 90 days supply | Qty: 90 | Fill #1 | Status: AC

## 2021-05-06 ENCOUNTER — Other Ambulatory Visit: Payer: Self-pay

## 2021-05-07 ENCOUNTER — Other Ambulatory Visit: Payer: Self-pay

## 2021-05-09 ENCOUNTER — Other Ambulatory Visit: Payer: Self-pay

## 2021-05-09 MED ORDER — HYDROXYCHLOROQUINE SULFATE 200 MG PO TABS
ORAL_TABLET | ORAL | 0 refills | Status: DC
  Filled 2021-05-09: qty 60, 30d supply, fill #0

## 2021-05-19 ENCOUNTER — Other Ambulatory Visit: Payer: Self-pay

## 2021-06-11 ENCOUNTER — Other Ambulatory Visit: Payer: Self-pay | Admitting: Cardiovascular Disease

## 2021-06-11 ENCOUNTER — Other Ambulatory Visit: Payer: Self-pay

## 2021-06-11 MED ORDER — HYDROXYCHLOROQUINE SULFATE 200 MG PO TABS
ORAL_TABLET | ORAL | 0 refills | Status: DC
  Filled 2021-06-11: qty 60, 30d supply, fill #0

## 2021-06-25 ENCOUNTER — Other Ambulatory Visit: Payer: Self-pay | Admitting: Cardiovascular Disease

## 2021-06-25 ENCOUNTER — Other Ambulatory Visit: Payer: Self-pay

## 2021-06-25 MED ORDER — CARVEDILOL 6.25 MG PO TABS
6.2500 mg | ORAL_TABLET | Freq: Two times a day (BID) | ORAL | 0 refills | Status: DC
  Filled 2021-06-25: qty 60, 30d supply, fill #0

## 2021-07-07 ENCOUNTER — Other Ambulatory Visit: Payer: Self-pay

## 2021-07-07 MED ORDER — ERGOCALCIFEROL 1.25 MG (50000 UT) PO CAPS
ORAL_CAPSULE | ORAL | 4 refills | Status: DC
  Filled 2021-07-07: qty 4, 28d supply, fill #0
  Filled 2021-08-04: qty 4, 28d supply, fill #1
  Filled 2021-08-27: qty 4, 28d supply, fill #2
  Filled 2021-09-23: qty 4, 28d supply, fill #3
  Filled 2021-11-06: qty 4, 28d supply, fill #4

## 2021-07-17 ENCOUNTER — Other Ambulatory Visit: Payer: Self-pay | Admitting: Cardiovascular Disease

## 2021-07-17 ENCOUNTER — Other Ambulatory Visit: Payer: Self-pay

## 2021-07-17 ENCOUNTER — Other Ambulatory Visit: Payer: Self-pay | Admitting: Family Medicine

## 2021-07-17 DIAGNOSIS — M792 Neuralgia and neuritis, unspecified: Secondary | ICD-10-CM

## 2021-07-17 MED ORDER — OLMESARTAN MEDOXOMIL 40 MG PO TABS
40.0000 mg | ORAL_TABLET | Freq: Every day | ORAL | 0 refills | Status: DC
  Filled 2021-07-17: qty 30, 30d supply, fill #0

## 2021-07-18 ENCOUNTER — Other Ambulatory Visit: Payer: Self-pay

## 2021-07-21 ENCOUNTER — Other Ambulatory Visit: Payer: Self-pay

## 2021-07-21 ENCOUNTER — Ambulatory Visit (INDEPENDENT_AMBULATORY_CARE_PROVIDER_SITE_OTHER): Payer: Medicare Other | Admitting: Cardiovascular Disease

## 2021-07-21 ENCOUNTER — Encounter: Payer: Self-pay | Admitting: Cardiovascular Disease

## 2021-07-21 VITALS — BP 126/78 | HR 76 | Ht 74.0 in | Wt 306.8 lb

## 2021-07-21 DIAGNOSIS — E785 Hyperlipidemia, unspecified: Secondary | ICD-10-CM | POA: Diagnosis not present

## 2021-07-21 DIAGNOSIS — I10 Essential (primary) hypertension: Secondary | ICD-10-CM | POA: Diagnosis not present

## 2021-07-21 DIAGNOSIS — Z79899 Other long term (current) drug therapy: Secondary | ICD-10-CM | POA: Diagnosis not present

## 2021-07-21 MED ORDER — HYDROXYCHLOROQUINE SULFATE 200 MG PO TABS
ORAL_TABLET | ORAL | 0 refills | Status: DC
Start: 2021-07-21 — End: 2021-08-27
  Filled 2021-07-21: qty 60, 30d supply, fill #0

## 2021-07-21 NOTE — Patient Instructions (Signed)
Medication Instructions:  The current medical regimen is effective;  continue present plan and medications.  *If you need a refill on your cardiac medications before your next appointment, please call your pharmacy*  Lab Work: Please have blood work today (Lipid,BMP)  If you have labs (blood work) drawn today and your tests are completely normal, you will receive your results only by: MyChart Message (if you have MyChart) OR A paper copy in the mail If you have any lab test that is abnormal or we need to change your treatment, we will call you to review the results.  Follow-Up: At [facility], you and your health needs are our priority.  As part of our continuing mission to provide you with exceptional heart care, we have created designated Provider Care Teams.  These Care Teams include your primary Cardiologist (physician) and Advanced Practice Providers (APPs -  Physician Assistants and Nurse Practitioners) who all work together to provide you with the care you need, when you need it.  We recommend signing up for the patient portal called "MyChart".  Sign up information is provided on this After Visit Summary.  MyChart is used to connect with patients for Virtual Visits (Telemedicine).  Patients are able to view lab/test results, encounter notes, upcoming appointments, etc.  Non-urgent messages can be sent to your provider as well.   To learn more about what you can do with MyChart, go to [email or web address].    Your next appointment:   1 year(s)  The format for your next appointment:   In Person  Provider:   Mertie Moores, MD     Thank you for choosing [facility]!!   Exercising to Stay Healthy To become healthy and stay healthy, it is recommended that you do moderate-intensity and vigorous-intensity exercise. You can tell that you are exercising at a moderate intensity if your heart starts beating faster and you start breathing faster but can still hold a  conversation. You can tell that you are exercising at a vigorous intensity if you are breathing much harder and faster and cannot hold a conversation while exercising. How can exercise benefit me? Exercising regularly is important. It has many health benefits, such as: Improving overall fitness, flexibility, and endurance. Increasing bone density. Helping with weight control. Decreasing body fat. Increasing muscle strength and endurance. Reducing stress and tension, anxiety, depression, or anger. Improving overall health. What guidelines should I follow while exercising? Before you start a new exercise program, talk with your health care provider. Do not exercise so much that you hurt yourself, feel dizzy, or get very short of breath. Wear comfortable clothes and wear shoes with good support. Drink plenty of water while you exercise to prevent dehydration or heat stroke. Work out until your breathing and your heartbeat get faster (moderate intensity). How often should I exercise? Choose an activity that you enjoy, and set realistic goals. Your health care provider can help you make an activity plan that is individually designed and works best for you. Exercise regularly as told by your health care provider. This may include: Doing strength training two times a week, such as: Lifting weights. Using resistance bands. Push-ups. Sit-ups. Yoga. Doing a certain intensity of exercise for a given amount of time. Choose from these options: A total of 150 minutes of moderate-intensity exercise every week. A total of 75 minutes of vigorous-intensity exercise every week. A mix of moderate-intensity and vigorous-intensity exercise every week. Children, pregnant women, people who have not exercised regularly, people who  are overweight, and older adults may need to talk with a health care provider about what activities are safe to perform. If you have a medical condition, be sure to talk with your  health care provider before you start a new exercise program. What are some exercise ideas? Moderate-intensity exercise ideas include: Walking 1 mile (1.6 km) in about 15 minutes. Biking. Hiking. Golfing. Dancing. Water aerobics. Vigorous-intensity exercise ideas include: Walking 4.5 miles (7.2 km) or more in about 1 hour. Jogging or running 5 miles (8 km) in about 1 hour. Biking 10 miles (16.1 km) or more in about 1 hour. Lap swimming. Roller-skating or in-line skating. Cross-country skiing. Vigorous competitive sports, such as football, basketball, and soccer. Jumping rope. Aerobic dancing. What are some everyday activities that can help me get exercise? Yard work, such as: Psychologist, educational. Raking and bagging leaves. Washing your car. Pushing a stroller. Shoveling snow. Gardening. Washing windows or floors. How can I be more active in my day-to-day activities? Use stairs instead of an elevator. Take a walk during your lunch break. If you drive, park your car farther away from your work or school. If you take public transportation, get off one stop early and walk the rest of the way. Stand up or walk around during all of your indoor phone calls. Get up, stretch, and walk around every 30 minutes throughout the day. Enjoy exercise with a friend. Support to continue exercising will help you keep a regular routine of activity. Where to find more information You can find more information about exercising to stay healthy from: U.S. Department of Health and Human Services: Bond[employer].at Centers for Disease Control and Prevention (CDC): http://www.wolf.info/ Summary Exercising regularly is important. It will improve your overall fitness, flexibility, and endurance. Regular exercise will also improve your overall health. It can help you control your weight, reduce stress, and improve your bone density. Do not exercise so much that you hurt yourself, feel dizzy, or get very short of  breath. Before you start a new exercise program, talk with your health care provider. This information is not intended to replace advice given to you by your health care provider. Make sure you discuss any questions you have with your health care provider. Document Revised: 11/29/2020 Document Reviewed: 11/29/2020 Elsevier Patient Education  [place].

## 2021-07-21 NOTE — Progress Notes (Signed)
Cardiology Office Note   Date:  07/21/2021   ID:  Dean Ford, DOB 1976/08/07, MRN [id number]  PCP:  Delsa Grana, PA-C  Cardiologist:   Mertie Moores, MD   Chief Complaint  Patient presents with   Hyperlipidemia   Hypertension   1. Hypertension 2. Chest pain - had a negative stress echo in Jan. 2016 3.  Hypothyroidism 4.  Obstructive sleep apnea 5.  Adrenal insufficiency   Dean Ford is a 45 who is referred by Dr. Debbora Dus for evaluation of his CP.  He has a hx of HTN and also has hypoadrenalism, hypothyroidism, low testosterone.  CP is just left of center.  Pain seems to be related to emotional stress.  Starts with dyspnea.  He would typically get shortness of breath, head ache and then get chest pain. Would take another losartan and all the symptoms would improve after an hour. Associated with exertion - walking .  Seems to be worse in the afternoon  He snores at night. Wakes up gasping for air frequently. Has been referred for sleep study.  He did not make the appt.  Does not get any regular exercise.    Has low energy due to his endocrine issues. Has had a head MRI. pituitary gland was ok. Has had cholesterol checked in the past - was borderline years ago.   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    Works also as a Museum/gallery conservator.  Was not able to complete his last training session because of shortness of breath.   September 20, 2014:  Dean Ford is a 45 y.o. male who presents for follow-up for his chest pain. He had an echocardiogram that revealed normal left ventricle systolic function. He had a stress echo ( results are not found at this time) He has not been on his BP med.   BP has been higher .    No further CP,  Not exercising at all.   December 13, 2014:  Dean Ford had some chest pain - mid sternal, radiated through his back Worsened with deep breath / deep  exhale Went to the [facility] ER  Work up was negative  Has felt fine since that time.   Has been working out vigorously without any chest   December 05, 2015 Dean Ford is seen for follow up .   Has had elevated BP . Has been painting his bathroom, gets sweaty, red faced. Has some chest tightness.  Has symptoms of sleep apnea. Snores, wakes up snoring,  Not getting any other exercise  Trying to stay away from fast foods.  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  Has gained 9 lbs in the past 6 months    Oct. 17, 2017: Dean Ford is seen back today  He was diagnosed with adrenal insufficiency approximate one year ago. He's been on Cortef and Florinef since that time. Recently he's been having problems with high blood pressures. He tries to watch his salt intake. 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  Feels dehydrated all of the time.   Drinks lots of fluids all day long and then has to urinate all night long .   Jan. 12, 2018:   Dean Ford is  seen today for follow-up visit. Has gained weight - 10 lbs since Oct. 2017. Had microdisc surgery 8 weeks ago. He had a ruptured disc between L5 and S1. He's back to work. He's not necessarily exercising much.  He takes the Florinef every 2-3 days .  Has cut out lots of his salty snacks  Has a poor appetite recently .   Has OSA,  Is going to get CPAP within the month   July 24, 2017:   doing ok Restarting back in his exercise  Working as a Manufacturing systems engineer for [employer] Has an ammonia smell to his sweat .   July 25, 2018: Dean Ford is doing fairly well.  He has a history of hypertension and obstructive sleep apnea.  He also has a history of adrenal insufficiency.  He is on Cortef and Florinef.  Had another back surgery .  No recent CP  BP looks good ,  Does not check at home Has to eat extra salt due to his adrenal  insufficiency Especially during the summer   Dec. 9, 2020  Dean Ford is seen for follow up for his hypertension.  He has a history of adrenal insufficiency.  Has questions about his [place]. And HTN associated with that  He has been taking double dose Florinef and his BP inceased.  Is exercising some .   Is playing some disc golf.   Unable to walk continuously .    Wt is 306 lbs.     Wt Readings from Last 3 Encounters:  07/21/21 (!) 306 lb 12.8 oz (139.2 kg)  08/15/20 (!) 305 lb (138.3 kg)  07/26/20 (!) 309 lb 9.6 oz (140.4 kg)    July 21, 2021: Dean Ford is seen today for follow up visit  Wt today is 307 lbs.  Has some muscle weakness.  Dos not get enough exercise    Past Medical History:  Diagnosis Date   Acute lumbar radiculopathy (Right) (S1) 05/26/2016   Acute myofascial pain 05/04/2017   Acute postoperative pain 06/22/2019   Adrenal insufficiency ([facility])    Anxiety    Anxiety disorder 01/28/2015   Arthritis    Back pain, chronic 06/03/2015   Hypertension    Hypothyroid    Sleep apnea    Thyroid disease     Past Surgical History:  Procedure Laterality Date   BACK SURGERY     COLONOSCOPY WITH PROPOFOL N/A 02/23/2019   Procedure: COLONOSCOPY WITH PROPOFOL;  Surgeon: Virgel Manifold, MD;  Location: [facility] ENDOSCOPY;  Service: Endoscopy;  Laterality: N/A;   discectomy N/A 2017, 2018, 2019   x3  L5-S1   ELBOW SURGERY     right elbow   ESOPHAGOGASTRODUODENOSCOPY (EGD) WITH PROPOFOL N/A 08/15/2020   Procedure: ESOPHAGOGASTRODUODENOSCOPY (EGD) WITH PROPOFOL;  Surgeon: Lucilla Lame, MD;  [facility] ENDOSCOPY;  Service: Endoscopy;  Laterality: N/A;     Current Outpatient Medications  Medication Sig Dispense Refill   carvedilol (COREG) 6.25 MG tablet Take 1 tablet (6.25 mg total) by mouth 2 (two) times daily. Please make overdue appt with Dr. Acie Fredrickson before anymore refills. Thank you 1st attempt 60 tablet 0   ergocalciferol (VITAMIN D2) 1.25 MG (50000 UT) capsule 1  capsule Orally Once a week 30 day(s) 4 capsule 4   fludrocortisone (FLORINEF) 0.1 MG tablet 1 tablet once a day orally 30 Orally Twice a day 30 days 60 tablet 12   gabapentin (NEURONTIN) 600 MG tablet Take 1 tablet (600 mg total) by mouth at bedtime. 90 tablet  1   levothyroxine (SYNTHROID) 100 MCG tablet TAKE 1 TABLET BY MOUTH ONCE DAILY 90 tablet 3   olmesartan (BENICAR) 40 MG tablet Take 1 tablet (40 mg total) by mouth daily. Please make overdue appt with Dr. Acie Fredrickson before anymore refills. Thank you 1st attempt 30 tablet 0   omeprazole (PRILOSEC) 20 MG capsule TAKE 1 CAPSULE BY MOUTH DAILY 30 MINUTES PRIOR TO FIRST MEAL OF DAY 90 capsule 6   predniSONE (DELTASONE) 5 MG tablet Take 2 tablets by mouth every morning and 1 tablet every evening. 360 tablet 4   sildenafil (VIAGRA) 100 MG tablet Take 1 tablet once daily as needed 60 tablet 4   testosterone (ANDROGEL) 50 MG/5GM (1%) GEL APPLY ONE PACKET TO SHOULDER, UPPER ARMS, OR ABDOMEN IN THE MORNING ONCE A DAY 150 g 5   azelastine (ASTELIN) 0.1 % nasal spray USE ONE TO TWO SPRAYS IN EACH NOSTRIL EVERY 12 HOURS (Patient not taking: Reported on 07/21/2021) 30 mL 11   cetirizine (ZYRTEC) 10 MG tablet Take 1 tablet (10 mg total) by mouth daily. (Patient not taking: Reported on 08/15/2020) 30 tablet 11   fluticasone (FLONASE) 50 MCG/ACT nasal spray PLACE 2 SPRAYS INTO BOTH NOSTRILS DAILY. (Patient not taking: Reported on 07/21/2021) 16 g 6   hydroxychloroquine (PLAQUENIL) 200 MG tablet TAKE 2 TABLETS BY MOUTH WITH FOOD OR MILK ONCE A DAY 60 tablet 0   testosterone cypionate (DEPOTESTOTERONE CYPIONATE) 200 MG/ML injection Inject 100 mg into the muscle every 14 (fourteen) days.  (Patient not taking: Reported on 07/26/2020)     Vitamin D, Ergocalciferol, (DRISDOL) 50000 units CAPS capsule Take 50,000 Units by mouth once a week. (Patient not taking: Reported on 08/15/2020)  2   No current facility-administered medications for this visit.    Allergies:   Borax  and Testosterone    Social History:  The patient  reports that he quit smoking about 25 years ago. His smoking use included cigarettes. He quit smokeless tobacco use about 25 years ago. He reports current alcohol use. He reports that he does not use drugs.   Family History:  The patient's family history includes Heart attack (age of onset: 58) in his father; Hypertension in his father and sister; Osteoarthritis in his mother.    ROS: As noted in the current history.  All other systems are negative.   Physical Exam: Blood pressure 126/78, pulse 76, height 6\' 2"  (1.88 m), weight (!) 306 lb 12.8 oz (139.2 kg), SpO2 97 %.  GEN:  morbidly obese male,  NAD  HEENT: Normal NECK: No JVD; No carotid bruits LYMPHATICS: No lymphadenopathy CARDIAC: RRR , no murmurs, rubs, gallops RESPIRATORY:  Clear to auscultation without rales, wheezing or rhonchi  ABDOMEN: Soft, non-tender, non-distended MUSCULOSKELETAL:  No edema; No deformity  SKIN: Warm and dry NEUROLOGIC:  Alert and oriented x 3   EKG:    July 21, 2021: Normal sinus rhythm at 76.  No ST or T wave changes.    Recent Labs: No results found for requested labs within last 8760 hours.    Lipid Panel    Component Value Date/Time   CHOL 217 (H) 11/15/2019 1236   CHOL 218 (H) 07/26/2019 1013   TRIG 190 (H) 11/15/2019 1236   HDL 33 (L) 11/15/2019 1236   HDL 38 (L) 07/26/2019 1013   CHOLHDL 6.6 (H) 11/15/2019 1236   VLDL 58 (H) 03/12/2016 0755   LDLCALC 151 (H) 11/15/2019 1236      Wt Readings from Last 3  Encounters:  07/21/21 (!) 306 lb 12.8 oz (139.2 kg)  08/15/20 (!) 305 lb (138.3 kg)  07/26/20 (!) 309 lb 9.6 oz (140.4 kg)      Other studies Reviewed: Additional studies/ records that were reviewed today include:  Stress echo and resting echo . Review of the above records demonstrates: normal LV function    ASSESSMENT AND PLAN:  1. Hypertension -    BP is well controlled.   Advised more exercise, less food.   Cont  meds for now  2.  Morbid obesity :   advised weight loss   3.  Hypothyroidism -     4. ? Obstructive sleep apnea -   advised weight  loss .   5. Adrenal insufficiency:.     Managed by endocrinology   Current medicines are reviewed at length with the patient today.  The patient does not have concerns regarding medicines.  The following changes have been made: No changes.        Signed, Mertie Moores, MD  07/21/2021 1:46 PM    [facility] [address], [place], [place]  [address] Phone: [phone]; Fax: [phone]

## 2021-07-22 LAB — BASIC METABOLIC PANEL
BUN/Creatinine Ratio: 10 (ref 9–20)
BUN: 12 mg/dL (ref 6–24)
CO2: 21 mmol/L (ref 20–29)
Calcium: 9.2 mg/dL (ref 8.7–10.2)
Chloride: 103 mmol/L (ref 96–106)
Creatinine, Ser: 1.16 mg/dL (ref 0.76–1.27)
Glucose: 91 mg/dL (ref 70–99)
Potassium: 3.7 mmol/L (ref 3.5–5.2)
Sodium: 143 mmol/L (ref 134–144)
eGFR: 79 mL/min/{1.73_m2} (ref >59)

## 2021-07-22 LAB — LIPID PANEL
Chol/HDL Ratio: 6.1 ratio — ABNORMAL HIGH (ref 0.0–5.0)
Cholesterol, Total: 245 mg/dL — ABNORMAL HIGH (ref 100–199)
HDL: 40 mg/dL (ref >39)
LDL Chol Calc (NIH): 171 mg/dL — ABNORMAL HIGH (ref 0–99)
Triglycerides: 183 mg/dL — ABNORMAL HIGH (ref 0–149)
VLDL Cholesterol Cal: 34 mg/dL (ref 5–40)

## 2021-07-28 ENCOUNTER — Telehealth: Payer: Self-pay | Admitting: *Deleted

## 2021-07-28 ENCOUNTER — Other Ambulatory Visit: Payer: Self-pay

## 2021-07-28 DIAGNOSIS — E785 Hyperlipidemia, unspecified: Secondary | ICD-10-CM

## 2021-07-28 MED ORDER — ROSUVASTATIN CALCIUM 10 MG PO TABS
10.0000 mg | ORAL_TABLET | Freq: Every day | ORAL | 3 refills | Status: DC
  Filled 2021-07-28: qty 90, 90d supply, fill #0

## 2021-07-28 NOTE — Telephone Encounter (Signed)
The patient has been notified of the result and verbalized understanding.  All questions (if any) were answered. Darrell Jewel, RN 07/28/2021 3:09 PM    Ordered Rosuvastatin  Placed lab orders and scheduled

## 2021-07-28 NOTE — Telephone Encounter (Signed)
-----   Message from Thayer Headings, MD sent at 07/22/2021  5:13 PM EST ----- Chol, trigs are all elevated.  Need Rosuvastatin 10 mg a day  Needs to work on diet, exercise, weight loss Repeat lipid, ALT in 6 months

## 2021-08-04 ENCOUNTER — Other Ambulatory Visit: Payer: Self-pay | Admitting: Cardiovascular Disease

## 2021-08-05 ENCOUNTER — Other Ambulatory Visit: Payer: Self-pay

## 2021-08-05 MED ORDER — CARVEDILOL 6.25 MG PO TABS
6.2500 mg | ORAL_TABLET | Freq: Two times a day (BID) | ORAL | 3 refills | Status: DC
  Filled 2021-08-05: qty 180, 90d supply, fill #0
  Filled 2021-11-06: qty 180, 90d supply, fill #1
  Filled 2022-01-26: qty 94, 47d supply, fill #2
  Filled 2022-01-26: qty 86, 43d supply, fill #2
  Filled 2022-04-13: qty 180, 90d supply, fill #3

## 2021-08-25 ENCOUNTER — Other Ambulatory Visit: Payer: Self-pay

## 2021-08-27 ENCOUNTER — Other Ambulatory Visit: Payer: Self-pay | Admitting: Cardiovascular Disease

## 2021-08-27 ENCOUNTER — Other Ambulatory Visit: Payer: Self-pay

## 2021-08-27 MED ORDER — HYDROXYCHLOROQUINE SULFATE 200 MG PO TABS
ORAL_TABLET | ORAL | 0 refills | Status: DC
  Filled 2021-08-27: qty 60, 30d supply, fill #0

## 2021-08-27 MED ORDER — OLMESARTAN MEDOXOMIL 40 MG PO TABS
40.0000 mg | ORAL_TABLET | Freq: Every day | ORAL | 10 refills | Status: DC
  Filled 2021-08-27: qty 30, 30d supply, fill #0
  Filled 2021-09-29: qty 30, 30d supply, fill #1
  Filled 2021-11-06: qty 30, 30d supply, fill #2
  Filled 2021-12-05: qty 30, 30d supply, fill #3
  Filled 2022-01-15: qty 30, 30d supply, fill #4
  Filled 2022-02-09: qty 30, 30d supply, fill #5
  Filled 2022-03-25: qty 30, 30d supply, fill #6
  Filled 2022-04-27: qty 30, 30d supply, fill #7
  Filled 2022-06-01: qty 30, 30d supply, fill #8
  Filled 2022-07-03: qty 30, 30d supply, fill #9
  Filled 2022-07-28: qty 30, 30d supply, fill #10

## 2021-08-27 MED FILL — Omeprazole Cap Delayed Release 20 MG: ORAL | 90 days supply | Qty: 90 | Fill #2 | Status: AC

## 2021-09-07 MED FILL — Levothyroxine Sodium Tab 100 MCG: ORAL | 90 days supply | Qty: 90 | Fill #2 | Status: AC

## 2021-09-08 ENCOUNTER — Other Ambulatory Visit: Payer: Self-pay

## 2021-09-23 ENCOUNTER — Other Ambulatory Visit: Payer: Self-pay

## 2021-09-29 ENCOUNTER — Other Ambulatory Visit: Payer: Self-pay

## 2021-09-29 MED ORDER — HYDROXYCHLOROQUINE SULFATE 200 MG PO TABS
ORAL_TABLET | ORAL | 0 refills | Status: DC
  Filled 2021-09-29: qty 60, 30d supply, fill #0

## 2021-09-30 ENCOUNTER — Other Ambulatory Visit: Payer: Self-pay

## 2021-11-06 ENCOUNTER — Other Ambulatory Visit: Payer: Self-pay

## 2021-11-06 MED ORDER — HYDROXYCHLOROQUINE SULFATE 200 MG PO TABS
ORAL_TABLET | ORAL | 0 refills | Status: DC
  Filled 2021-11-06: qty 180, 90d supply, fill #0

## 2021-11-06 MED ORDER — TESTOSTERONE 50 MG/5GM (1%) TD GEL
TRANSDERMAL | 5 refills | Status: DC
  Filled 2021-11-06: qty 150, 30d supply, fill #0
  Filled 2022-01-15: qty 150, 30d supply, fill #1

## 2021-12-05 ENCOUNTER — Other Ambulatory Visit: Payer: Self-pay

## 2021-12-05 MED ORDER — LEVOTHYROXINE SODIUM 100 MCG PO TABS
ORAL_TABLET | ORAL | 4 refills | Status: DC
  Filled 2021-12-05: qty 90, 90d supply, fill #0

## 2021-12-05 MED ORDER — VITAMIN D (ERGOCALCIFEROL) 1.25 MG (50000 UNIT) PO CAPS
ORAL_CAPSULE | ORAL | 4 refills | Status: DC
  Filled 2021-12-05: qty 4, 28d supply, fill #0
  Filled 2022-01-15: qty 4, 28d supply, fill #1
  Filled 2022-02-09: qty 4, 28d supply, fill #2
  Filled 2022-03-25: qty 4, 28d supply, fill #3
  Filled 2022-04-22: qty 4, 28d supply, fill #4

## 2021-12-05 MED ORDER — OMEPRAZOLE 20 MG PO CPDR
DELAYED_RELEASE_CAPSULE | ORAL | 0 refills | Status: DC
  Filled 2021-12-05: qty 90, 90d supply, fill #0

## 2021-12-08 ENCOUNTER — Other Ambulatory Visit: Payer: Self-pay

## 2022-01-01 ENCOUNTER — Encounter: Payer: Self-pay | Admitting: Pharmacist

## 2022-01-01 ENCOUNTER — Other Ambulatory Visit: Payer: Self-pay

## 2022-01-01 MED ORDER — WEGOVY 0.25 MG/0.5ML ~~LOC~~ SOAJ
SUBCUTANEOUS | 1 refills | Status: DC
  Filled 2022-01-01: qty 2, 28d supply, fill #0

## 2022-01-02 ENCOUNTER — Other Ambulatory Visit: Payer: Self-pay

## 2022-01-15 ENCOUNTER — Other Ambulatory Visit: Payer: Self-pay

## 2022-01-23 ENCOUNTER — Other Ambulatory Visit: Payer: Self-pay

## 2022-01-23 MED ORDER — LEVOTHYROXINE SODIUM 125 MCG PO TABS
ORAL_TABLET | ORAL | 6 refills | Status: DC
  Filled 2022-01-23 – 2022-04-22 (×2): qty 30, 30d supply, fill #0
  Filled 2022-07-28: qty 30, 30d supply, fill #1
  Filled 2022-08-25: qty 30, 30d supply, fill #2
  Filled 2022-11-20: qty 30, 30d supply, fill #3

## 2022-01-23 MED ORDER — LOMAIRA 8 MG PO TABS
ORAL_TABLET | ORAL | 3 refills | Status: DC
Start: 2022-01-23 — End: 2023-01-14
  Filled 2022-01-23: qty 30, 30d supply, fill #0

## 2022-01-23 MED ORDER — LEVOTHYROXINE SODIUM 125 MCG PO TABS
ORAL_TABLET | ORAL | 6 refills | Status: DC
  Filled 2022-01-23: qty 30, 30d supply, fill #0
  Filled 2022-07-03: qty 30, 30d supply, fill #1

## 2022-01-26 ENCOUNTER — Other Ambulatory Visit: Payer: Self-pay

## 2022-01-28 ENCOUNTER — Other Ambulatory Visit: Payer: Medicare Other

## 2022-02-02 ENCOUNTER — Other Ambulatory Visit (HOSPITAL_COMMUNITY): Payer: Self-pay

## 2022-02-09 ENCOUNTER — Other Ambulatory Visit: Payer: Self-pay

## 2022-02-09 MED ORDER — PREDNISONE 5 MG PO TABS
ORAL_TABLET | ORAL | 4 refills | Status: DC
  Filled 2022-02-09: qty 270, 90d supply, fill #0
  Filled 2022-04-13 – 2022-04-22 (×2): qty 270, 90d supply, fill #1
  Filled 2022-07-03: qty 90, 30d supply, fill #2
  Filled 2022-07-28: qty 90, 30d supply, fill #3
  Filled 2022-08-25: qty 90, 30d supply, fill #4
  Filled 2022-10-16: qty 90, 30d supply, fill #5
  Filled 2022-11-20: qty 90, 30d supply, fill #6
  Filled 2022-12-31: qty 90, 30d supply, fill #7
  Filled 2023-01-26: qty 90, 30d supply, fill #8

## 2022-02-11 ENCOUNTER — Other Ambulatory Visit: Payer: Self-pay

## 2022-02-18 ENCOUNTER — Other Ambulatory Visit: Payer: Self-pay

## 2022-02-19 ENCOUNTER — Other Ambulatory Visit: Payer: Self-pay

## 2022-02-19 MED ORDER — HYDROXYCHLOROQUINE SULFATE 200 MG PO TABS
ORAL_TABLET | ORAL | 0 refills | Status: DC
Start: 2022-02-19 — End: 2022-05-22
  Filled 2022-02-19: qty 180, 90d supply, fill #0

## 2022-02-27 ENCOUNTER — Other Ambulatory Visit: Payer: Self-pay

## 2022-03-03 ENCOUNTER — Other Ambulatory Visit: Payer: Self-pay

## 2022-03-04 ENCOUNTER — Other Ambulatory Visit: Payer: Self-pay

## 2022-03-25 ENCOUNTER — Other Ambulatory Visit: Payer: Self-pay

## 2022-03-26 ENCOUNTER — Other Ambulatory Visit: Payer: Self-pay

## 2022-03-27 ENCOUNTER — Other Ambulatory Visit: Payer: Self-pay

## 2022-03-31 ENCOUNTER — Other Ambulatory Visit: Payer: Self-pay

## 2022-04-01 ENCOUNTER — Other Ambulatory Visit: Payer: Self-pay

## 2022-04-01 MED ORDER — OMEPRAZOLE 20 MG PO CPDR
DELAYED_RELEASE_CAPSULE | ORAL | 0 refills | Status: DC
Start: 2022-04-01 — End: 2023-01-14
  Filled 2022-04-01: qty 30, 30d supply, fill #0

## 2022-04-06 ENCOUNTER — Other Ambulatory Visit: Payer: Self-pay

## 2022-04-09 ENCOUNTER — Other Ambulatory Visit: Payer: Self-pay

## 2022-04-09 MED ORDER — OMEPRAZOLE 40 MG PO CPDR
DELAYED_RELEASE_CAPSULE | ORAL | 3 refills | Status: DC
  Filled 2022-04-09: qty 90, 90d supply, fill #0
  Filled 2022-07-28: qty 90, 90d supply, fill #1
  Filled 2022-11-20: qty 90, 90d supply, fill #2
  Filled 2023-02-25: qty 90, 90d supply, fill #3

## 2022-04-13 ENCOUNTER — Other Ambulatory Visit: Payer: Self-pay | Admitting: Family Medicine

## 2022-04-13 ENCOUNTER — Other Ambulatory Visit: Payer: Self-pay

## 2022-04-15 ENCOUNTER — Other Ambulatory Visit: Payer: Self-pay

## 2022-04-22 ENCOUNTER — Other Ambulatory Visit: Payer: Self-pay

## 2022-04-27 ENCOUNTER — Other Ambulatory Visit: Payer: Self-pay

## 2022-05-08 ENCOUNTER — Other Ambulatory Visit: Payer: Self-pay

## 2022-05-08 MED ORDER — FLUDROCORTISONE ACETATE 0.1 MG PO TABS
ORAL_TABLET | ORAL | 12 refills | Status: DC
  Filled 2022-05-08: qty 60, 30d supply, fill #0
  Filled 2022-06-23: qty 60, 30d supply, fill #1
  Filled 2022-07-28: qty 60, 30d supply, fill #2
  Filled 2022-09-07: qty 60, 30d supply, fill #3
  Filled 2022-10-22: qty 60, 30d supply, fill #4
  Filled 2022-11-20: qty 60, 30d supply, fill #5
  Filled 2022-12-31: qty 60, 30d supply, fill #6
  Filled 2023-01-26: qty 60, 30d supply, fill #7
  Filled 2023-02-25: qty 60, 30d supply, fill #8
  Filled 2023-04-09: qty 60, 30d supply, fill #9

## 2022-05-21 ENCOUNTER — Other Ambulatory Visit: Payer: Self-pay

## 2022-05-21 ENCOUNTER — Other Ambulatory Visit: Payer: Self-pay | Admitting: Family Medicine

## 2022-05-22 ENCOUNTER — Other Ambulatory Visit: Payer: Self-pay

## 2022-05-22 MED ORDER — HYDROXYCHLOROQUINE SULFATE 200 MG PO TABS
ORAL_TABLET | ORAL | 0 refills | Status: DC
  Filled 2022-05-22: qty 180, 90d supply, fill #0

## 2022-06-01 ENCOUNTER — Other Ambulatory Visit: Payer: Self-pay | Admitting: Family Medicine

## 2022-06-01 ENCOUNTER — Other Ambulatory Visit: Payer: Self-pay

## 2022-06-02 ENCOUNTER — Other Ambulatory Visit: Payer: Self-pay

## 2022-06-02 MED ORDER — SILDENAFIL CITRATE 100 MG PO TABS
100.0000 mg | ORAL_TABLET | Freq: Every day | ORAL | 4 refills | Status: DC | PRN
  Filled 2022-06-02: qty 4, 30d supply, fill #0
  Filled 2022-07-28: qty 4, 30d supply, fill #1
  Filled 2022-11-20: qty 4, 30d supply, fill #2
  Filled 2022-12-31: qty 4, 30d supply, fill #3
  Filled 2023-02-25: qty 4, 30d supply, fill #4
  Filled 2023-03-21: qty 4, 30d supply, fill #5
  Filled 2023-05-14: qty 4, 30d supply, fill #6
  Filled 2023-05-28 – 2023-06-01 (×2): qty 4, 30d supply, fill #7

## 2022-06-23 ENCOUNTER — Other Ambulatory Visit: Payer: Self-pay

## 2022-07-03 ENCOUNTER — Other Ambulatory Visit: Payer: Self-pay

## 2022-07-27 ENCOUNTER — Ambulatory Visit
Admission: EM | Admit: 2022-07-27 | Discharge: 2022-07-27 | Disposition: A | Discharge status: Home / Self Care | Payer: Medicare Other | Attending: Physician Assistant | Admitting: Physician Assistant

## 2022-07-27 ENCOUNTER — Ambulatory Visit (INDEPENDENT_AMBULATORY_CARE_PROVIDER_SITE_OTHER): Payer: Medicare Other

## 2022-07-27 DIAGNOSIS — J069 Acute upper respiratory infection, unspecified: Secondary | ICD-10-CM

## 2022-07-27 DIAGNOSIS — R059 Cough, unspecified: Secondary | ICD-10-CM

## 2022-07-27 DIAGNOSIS — S29011A Strain of muscle and tendon of front wall of thorax, initial encounter: Secondary | ICD-10-CM

## 2022-07-27 DIAGNOSIS — R0782 Intercostal pain: Secondary | ICD-10-CM | POA: Diagnosis not present

## 2022-07-27 MED ORDER — BENZONATATE 100 MG PO CAPS: 100.0000 mg | ORAL_CAPSULE | Freq: Three times a day (TID) | ORAL | 0 refills | Status: DC

## 2022-07-27 MED ORDER — METHOCARBAMOL 500 MG PO TABS: 500.0000 mg | ORAL_TABLET | Freq: Two times a day (BID) | ORAL | 0 refills | Status: DC

## 2022-07-27 NOTE — Discharge Instructions (Signed)
Your x-ray was normal with no evidence of fracture.  I suspect that you have strained a muscle.  Please use heat over this area.  Alternate Tylenol and ibuprofen.  Use Robaxin up to twice a day for pain.  This will make you sleepy so do not drive or drink alcohol with taking it.  Use Mucinex, Flonase as well as the prescribed Tessalon to help manage your cough.  If anything worsens please return for reevaluation.

## 2022-07-27 NOTE — ED Provider Notes (Signed)
[facility]    CSN: [id number] Arrival date & time: 07/27/22  1935      History   Chief Complaint Chief Complaint  Patient presents with   Ribcage Pain    RT side   Cough   Nasal Congestion    HPI Dean Ford is a 46 y.o. male.   Patient presents today with a several hour history of severe right-sided rib pain.  Reports that for the past week he has had URI symptoms including cough and congestion.  Earlier he had a significant coughing fit and felt a pop in his right lower anterior rib cage that has had ongoing pain since that time.  Reports pain has been 5-6 at rest but increases to 8 with cough.  He has not tried any over-the-counter medication for symptom management.  He is concerned that he might of broken a rib.  He denies any history of allergies, asthma, COPD, smoking.  Denies any recent antibiotics.     Past Medical History:  Diagnosis Date   Acute lumbar radiculopathy (Right) (S1) 05/26/2016   Acute myofascial pain 05/04/2017   Acute postoperative pain 06/22/2019   Adrenal insufficiency ([place])    Anxiety    Anxiety disorder 01/28/2015   Arthritis    Back pain, chronic 06/03/2015   Hypertension    Hypothyroid    Sleep apnea    Thyroid disease     Patient Active Problem List   Diagnosis Date Noted   Problems with swallowing and mastication    Stricture and stenosis of esophagus    Gastritis without bleeding    Gastroesophageal reflux disease 07/26/2020   Opioid-induced hyperalgesia 05/22/2020   Nondiabetic gastroparesis 05/22/2020    Class: History of   History of illicit drug use 78/29/5621   History of marijuana use 05/18/2020   Pain medication agreement broken 05/18/2020   Neurogenic pain 07/03/2019   DDD (degenerative disc disease), lumbosacral 06/22/2019   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) 05/01/2019   Occult blood in stools    Rectal polyp    Diverticulosis of large intestine without diverticulitis     Pharmacologic therapy 02/09/2019   Disorder of skeletal system 02/09/2019   Problems influencing health status 02/09/2019   Hypogonadism male 09/28/2018   Rheumatoid arthritis ([place]) 09/28/2018   Carpal tunnel syndrome 09/28/2018   Chronic musculoskeletal pain 04/25/2018   Spondylosis without myelopathy or radiculopathy, lumbosacral region 02/08/2018   Chronic lower extremity pain (Secondary Area of Pain) (Bilateral) (R>L) 02/08/2018   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/2016   Spasm of paraspinal muscle 01/22/2016   Lower extremity numbness (Bilateral) (R>L) 01/22/2016   Chronic lumbar radicular pain (Right: S1, Bilateral: L5) (Bilateral) (R>L) 01/22/2016   Chronic hip pain (Right) 01/22/2016   Osteoarthritis of hip (Right) 01/22/2016   Chronic low back pain (Primary Area of Pain) (Bilateral) (R>L) 12/18/2015   Erectile dysfunction 12/18/2015   Chronic pain syndrome 09/02/2015   Long term prescription opiate use 09/02/2015   Encounter for chronic pain management 09/02/2015   Chronic sacroiliac joint pain (Bilateral) (R>L) 06/11/2015   Lumbar facet hypertrophy ([place]) 06/11/2015   Lumbosacral radiculopathy at S1 (right-sided) 06/11/2015   Encounter for therapeutic drug level monitoring 06/03/2015   Long term current use of opiate analgesic 30/86/5784   Uncomplicated opioid dependence ([place]) 06/03/2015   Opiate use 06/03/2015   Lumbar spondylosis 06/03/2015   Lumbar facet syndrome (Bilateral) (R>L) 06/03/2015   Insomnia, persistent 06/03/2015  Intermittent muscle cramps 06/03/2015   Always thirsty 06/03/2015   Avitaminosis D 06/03/2015   Lumbar spine pain 06/03/2015   Hip arthrosis 06/03/2015   Class 2 severe obesity due to excess calories with serious comorbidity and body mass index (BMI) of 38.0 to 38.9 in adult ([place]) 06/03/2015   DDD (degenerative disc disease), lumbar 06/03/2015   L4-L5 disc bulge 06/03/2015   Ligamentum  flavum hypertrophy 06/03/2015   Bulging lumbar disc 06/03/2015   Obstructive sleep apnea syndrome 06/03/2015   Mixed dyslipidemia 03/01/2015   Dehydration symptoms 01/28/2015   Essential hypertension 07/20/2014   Chest discomfort 07/20/2014   Hypothyroidism 07/20/2014   Adrenal insufficiency ([place]) 07/20/2014    Past Surgical History:  Procedure Laterality Date   BACK SURGERY     COLONOSCOPY WITH PROPOFOL N/A 02/23/2019   Procedure: COLONOSCOPY WITH PROPOFOL;  Surgeon: Virgel Manifold, MD;  Location: [facility] ENDOSCOPY;  Service: Endoscopy;  Laterality: N/A;   discectomy N/A 2017, 2018, 2019   x3  L5-S1   ELBOW SURGERY     right elbow   ESOPHAGOGASTRODUODENOSCOPY (EGD) WITH PROPOFOL N/A 08/15/2020   Procedure: ESOPHAGOGASTRODUODENOSCOPY (EGD) WITH PROPOFOL;  Surgeon: Lucilla Lame, MD;  Location[facility] ENDOSCOPY;  Service: Endoscopy;  Laterality: N/A;       Home Medications    Prior to Admission medications   Medication Sig Start Date End Date Taking? Authorizing Provider  benzonatate (TESSALON) 100 MG capsule Take 1 capsule (100 mg total) by mouth every 8 (eight) hours. 07/27/22  Yes Stacie Templin K, PA-C  methocarbamol (ROBAXIN) 500 MG tablet Take 1 tablet (500 mg total) by mouth 2 (two) times daily. 07/27/22  Yes Alphus Zeck K, PA-C  azelastine (ASTELIN) 0.1 % nasal spray USE ONE TO TWO SPRAYS IN EACH NOSTRIL EVERY 12 HOURS Patient not taking: Reported on 07/21/2021 11/05/20 11/05/21  Carloyn Manner, MD  carvedilol (COREG) 6.25 MG tablet Take 1 tablet (6.25 mg total) by mouth 2 (two) times daily. 08/05/21   Nahser, Wonda Cheng, MD  cetirizine (ZYRTEC) 10 MG tablet Take 1 tablet (10 mg total) by mouth daily. Patient not taking: Reported on 08/15/2020 07/26/20   Delsa Grana, PA-C  fludrocortisone (FLORINEF) 0.1 MG tablet 1 tablet once a day orally 30 Orally Twice a day 30 days 05/08/22     fluticasone (FLONASE) 50 MCG/ACT nasal spray PLACE 2 SPRAYS INTO BOTH NOSTRILS DAILY. Patient  not taking: Reported on 07/21/2021 07/26/20 07/26/21  Delsa Grana, PA-C  gabapentin (NEURONTIN) 600 MG tablet Take 1 tablet (600 mg total) by mouth at bedtime. 01/31/21 07/30/21  Delsa Grana, PA-C  hydroxychloroquine (PLAQUENIL) 200 MG tablet take 2 tablets by mouth with food or milk once a day Orally daily 90 days 05/22/22     levothyroxine (SYNTHROID) 100 MCG tablet TAKE 1 TABLET BY MOUTH ONCE DAILY 10/03/20 10/03/21  Jacelyn Pi, MD  levothyroxine (SYNTHROID) 100 MCG tablet 1 tablet in the morning on an empty stomach Orally Once a day 90 days 12/05/21     levothyroxine (SYNTHROID) 125 MCG tablet 1 tablet in the morning on an empty stomach Orally Once a day 30 day(s) 01/23/22     levothyroxine (SYNTHROID) 125 MCG tablet 1 tablet in the morning on an empty stomach Orally Once a day 30 day(s) 01/23/22     olmesartan (BENICAR) 40 MG tablet Take 1 tablet (40 mg total) by mouth daily. 08/27/21   Nahser, Wonda Cheng, MD  omeprazole (PRILOSEC) 20 MG capsule TAKE 1 CAPSULE BY MOUTH DAILY 30 MINUTES PRIOR TO  FIRST MEAL OF DAY 11/05/20 11/26/21  Carloyn Manner, MD  omeprazole (PRILOSEC) 20 MG capsule Take 30 minutes prior to first meal of day 12/05/21     omeprazole (PRILOSEC) 20 MG capsule 1 capsule by mouth daily 30 min prior to first meal of day 04/01/22     omeprazole (PRILOSEC) 40 MG capsule Take one pill once daily 30 minutes prior to last meal of the day. 04/09/22     Phentermine HCl (LOMAIRA) 8 MG TABS 1 tablet 30 minutes before meals Orally once a day 30 days 01/23/22     predniSONE (DELTASONE) 5 MG tablet Take 2 tablets by mouth every morning and 1 tablet every evening. 02/09/22     rosuvastatin (CRESTOR) 10 MG tablet Take 1 tablet (10 mg total) by mouth daily. 07/28/21   Nahser, Wonda Cheng, MD  Semaglutide-Weight Management (WEGOVY) 0.25 MG/0.5ML SOAJ 0.5 ml Subcutaneous Once a week 30 day(s) 01/01/22     sildenafil (VIAGRA) 100 MG tablet Take 1 tablet (100 mg total) by mouth daily as needed. 06/01/22      testosterone (ANDROGEL) 50 MG/5GM (1%) GEL APPLY ONE PACKET TO SHOULDER, UPPER ARMS, OR ABDOMEN IN THE MORNING ONCE A DAY 11/06/21     testosterone cypionate (DEPOTESTOTERONE CYPIONATE) 200 MG/ML injection Inject 100 mg into the muscle every 14 (fourteen) days.  Patient not taking: Reported on 07/26/2020    [provider]  Vitamin D, Ergocalciferol, (DRISDOL) 1.25 MG (50000 UNIT) CAPS capsule 1 capsule Orally Once a week 30 day(s) 12/05/21     Vitamin D, Ergocalciferol, (DRISDOL) 50000 units CAPS capsule Take 50,000 Units by mouth once a week. Patient not taking: Reported on 08/15/2020 01/06/18   [provider]  Oxycodone HCl 10 MG TABS Take 1 tablet (10 mg total) by mouth every 6 (six) hours as needed. Must last 30 days. 04/25/20 05/22/20  Gillis Santa, MD    Family History Family History  Problem Relation Age of Onset   Osteoarthritis Mother    Heart attack Father 60   Hypertension Father    Hypertension Sister     Social History Social History   Tobacco Use   Smoking status: Former    Types: Cigarettes    Quit date: 11/30/1995    Years since quitting: 26.6   Smokeless tobacco: Former    Quit date: 11/30/1995  Vaping Use   Vaping Use: Never used  Substance Use Topics   Alcohol use: Yes    Alcohol/week: 0.0 standard drinks of alcohol    Comment: rarely   Drug use: No     Allergies   Borax and Testosterone   Review of Systems Review of Systems  Constitutional:  Positive for activity change. Negative for appetite change, fatigue and fever.  HENT:  Positive for congestion. Negative for sinus pressure, sneezing and sore throat.   Respiratory:  Positive for cough. Negative for shortness of breath.   Cardiovascular:  Positive for chest pain (Chest wall).  Gastrointestinal:  Negative for abdominal pain, diarrhea, nausea and vomiting.     Physical Exam Triage Vital Signs ED Triage Vitals  Enc Vitals Group     BP 07/27/22 2005 (!) 154/80     Pulse Rate  07/27/22 2003 76     Resp --      Temp 07/27/22 2003 98.3 F (36.8 C)     Temp Source 07/27/22 2003 Oral     SpO2 07/27/22 2003 97 %     Weight 07/27/22 2002 (!) 310 lb (140.6  kg)     Height 07/27/22 2002 '6\' 2"'$  (1.88 m)     Head Circumference --      Peak Flow --      Pain Score 07/27/22 2002 8     Pain Loc --      Pain Edu? --      Excl. in [place]? --    No data found.  Updated Vital Signs BP (!) 154/80   Pulse 76   Temp 98.3 F (36.8 C) (Oral)   Ht '6\' 2"'$  (1.88 m)   Wt (!) 310 lb (140.6 kg)   SpO2 97%   BMI 39.80 kg/m   Visual Acuity Right Eye Distance:   Left Eye Distance:   Bilateral Distance:    Right Eye Near:   Left Eye Near:    Bilateral Near:     Physical Exam Vitals reviewed.  Constitutional:      General: He is awake.     Appearance: Normal appearance. He is well-developed. He is not ill-appearing.     Comments: Very pleasant male appears stated age in no acute distress sitting comfortably in exam room  HENT:     Head: Normocephalic and atraumatic.     Nose: Nose normal.     Mouth/Throat:     Pharynx: Uvula midline. No oropharyngeal exudate or posterior oropharyngeal erythema.  Cardiovascular:     Rate and Rhythm: Normal rate and regular rhythm.     Heart sounds: Normal heart sounds, S1 normal and S2 normal. No murmur heard. Pulmonary:     Effort: Pulmonary effort is normal.     Breath sounds: Normal breath sounds. No stridor. No wheezing, rhonchi or rales.     Comments: Clear to auscultation bilaterally Chest:     Chest wall: Tenderness present. No deformity or swelling.    Abdominal:     General: Bowel sounds are normal.     Palpations: Abdomen is soft.     Tenderness: There is no abdominal tenderness. There is no right CVA tenderness, left CVA tenderness, guarding or rebound.  Neurological:     Mental Status: He is alert.  Psychiatric:        Behavior: Behavior is cooperative.      UC Treatments / Results  Labs (all labs ordered are  listed, but only abnormal results are displayed) Labs Reviewed - No data to display  EKG   Radiology DG Ribs Unilateral W/Chest Right  Result Date: 07/27/2022 CLINICAL DATA:  Cough, right rib pain EXAM: RIGHT RIBS AND CHEST - 3+ VIEW COMPARISON:  12/04/2014 FINDINGS: No fracture or other bone lesions are seen involving the ribs. There is no evidence of pneumothorax or pleural effusion. Both lungs are clear. Heart size and mediastinal contours are within normal limits. IMPRESSION: Negative. Electronically Signed   By: Davina Poke D.O.   On: 07/27/2022 20:39    Procedures Procedures (including critical care time)  Medications Ordered in UC Medications - No data to display  Initial Impression / Assessment and Plan / UC Course  I have reviewed the triage vital signs and the nursing notes.  Pertinent labs & imaging results that were available during my care of the patient were reviewed by me and considered in my medical decision making (see chart for details).     Patient is well-appearing, afebrile, nontoxic, nontachycardic, with oxygen saturation of 97%.  No indication for viral testing as he has been symptomatic for over a week and this would not change management.  Rib/chest x-ray  was obtained that showed no acute osseous abnormality.  Suspect muscle strain as etiology of symptoms.  He was encouraged to alternate Tylenol and ibuprofen.  Discussed that he can use over-the-counter medication such as Robitussin as well as prescribed Tessalon to help manage his cough to prevent reaggravation of the injury.  Also recommended that he brace his abdominal/rib muscles with a pillow if he is going to cough.  He was prescribed Robaxin to be taken up to twice a day.  Discussed this can be sedating and he is not to drive or drink alcohol with taking it.  He can use heat, rest for additional symptom relief.  Discussed that if anything worsens or changes then he has shortness of breath, worsening  cough, fever, nausea, vomiting, increased pain he should be seen immediately.  Strict return precautions given.  Work excuse note provided.  Final Clinical Impressions(s) / UC Diagnoses   Final diagnoses:  Muscle strain of anterior chest wall  Upper respiratory tract infection, unspecified type     Discharge Instructions      Your x-ray was normal with no evidence of fracture.  I suspect that you have strained a muscle.  Please use heat over this area.  Alternate Tylenol and ibuprofen.  Use Robaxin up to twice a day for pain.  This will make you sleepy so do not drive or drink alcohol with taking it.  Use Mucinex, Flonase as well as the prescribed Tessalon to help manage your cough.  If anything worsens please return for reevaluation.     ED Prescriptions     Medication Sig Dispense Auth. Provider   methocarbamol (ROBAXIN) 500 MG tablet Take 1 tablet (500 mg total) by mouth 2 (two) times daily. 20 tablet Kaevion Sinclair K, PA-C   benzonatate (TESSALON) 100 MG capsule Take 1 capsule (100 mg total) by mouth every 8 (eight) hours. 21 capsule Loran Auguste K, PA-C      PDMP not reviewed this encounter.   Terrilee Croak, PA-C 07/27/22 2050

## 2022-07-27 NOTE — ED Triage Notes (Signed)
Pt c/o cough x1 week, pt reports pain in RT side of ribs when coughing, pt states he felt a pop earlier today while driving

## 2022-07-28 ENCOUNTER — Other Ambulatory Visit: Payer: Self-pay

## 2022-07-29 ENCOUNTER — Other Ambulatory Visit: Payer: Self-pay

## 2022-07-29 MED ORDER — TESTOSTERONE 50 MG/5GM (1%) TD GEL
TRANSDERMAL | 5 refills | Status: DC
  Filled 2022-07-29 – 2022-08-25 (×2): qty 150, 30d supply, fill #0

## 2022-08-07 ENCOUNTER — Other Ambulatory Visit: Payer: Self-pay

## 2022-08-25 ENCOUNTER — Other Ambulatory Visit: Payer: Self-pay

## 2022-09-07 ENCOUNTER — Other Ambulatory Visit: Payer: Self-pay

## 2022-09-08 ENCOUNTER — Other Ambulatory Visit: Payer: Self-pay

## 2022-09-08 MED ORDER — HYDROXYCHLOROQUINE SULFATE 200 MG PO TABS
400.0000 mg | ORAL_TABLET | Freq: Every day | ORAL | 0 refills | Status: DC
  Filled 2022-09-08: qty 180, 90d supply, fill #0

## 2022-10-13 ENCOUNTER — Other Ambulatory Visit: Payer: Self-pay

## 2022-10-13 MED ORDER — TESTOSTERONE CYPIONATE 200 MG/ML IM SOLN
100.0000 mg | INTRAMUSCULAR | 5 refills | Status: DC
  Filled 2022-10-13: qty 1, 28d supply, fill #0
  Filled 2022-11-20: qty 1, 28d supply, fill #1
  Filled 2023-01-26: qty 1, 28d supply, fill #2
  Filled 2023-02-25: qty 1, 28d supply, fill #3
  Filled 2023-03-21: qty 1, 28d supply, fill #4
  Filled 2023-04-09: qty 1, 28d supply, fill #5

## 2022-10-13 MED ORDER — "BD LUER-LOK SYRINGE 23G X 1-1/2"" 3 ML MISC"
1.0000 | 5 refills | Status: AC
  Filled 2022-11-20 – 2023-05-28 (×6): qty 25, fill #0
  Filled 2023-06-30: qty 25, 100d supply, fill #0
  Filled 2023-07-21: qty 25, 350d supply, fill #0

## 2022-10-16 ENCOUNTER — Other Ambulatory Visit: Payer: Self-pay

## 2022-10-16 ENCOUNTER — Other Ambulatory Visit: Payer: Self-pay | Admitting: Cardiovascular Disease

## 2022-10-16 MED ORDER — CARVEDILOL 6.25 MG PO TABS
6.2500 mg | ORAL_TABLET | Freq: Two times a day (BID) | ORAL | 0 refills | Status: DC
  Filled 2022-10-16: qty 60, 30d supply, fill #0

## 2022-10-16 MED ORDER — ROSUVASTATIN CALCIUM 10 MG PO TABS
10.0000 mg | ORAL_TABLET | Freq: Every day | ORAL | 0 refills | Status: DC
  Filled 2022-10-16: qty 30, 30d supply, fill #0

## 2022-10-16 MED ORDER — OLMESARTAN MEDOXOMIL 40 MG PO TABS
40.0000 mg | ORAL_TABLET | Freq: Every day | ORAL | 0 refills | Status: DC
  Filled 2022-10-16: qty 30, 30d supply, fill #0

## 2022-11-20 ENCOUNTER — Other Ambulatory Visit: Payer: Self-pay | Admitting: Cardiovascular Disease

## 2022-11-20 ENCOUNTER — Other Ambulatory Visit: Payer: Self-pay

## 2022-11-20 MED ORDER — ROSUVASTATIN CALCIUM 10 MG PO TABS
10.0000 mg | ORAL_TABLET | Freq: Every day | ORAL | 1 refills | Status: DC
  Filled 2022-11-20: qty 30, 30d supply, fill #0
  Filled 2023-01-26: qty 30, 30d supply, fill #1

## 2022-11-20 MED ORDER — CARVEDILOL 6.25 MG PO TABS
6.2500 mg | ORAL_TABLET | Freq: Two times a day (BID) | ORAL | 1 refills | Status: DC
  Filled 2022-11-20: qty 60, 30d supply, fill #0
  Filled 2023-01-26: qty 60, 30d supply, fill #1

## 2022-11-20 MED ORDER — HYDROXYCHLOROQUINE SULFATE 200 MG PO TABS
400.0000 mg | ORAL_TABLET | Freq: Every day | ORAL | 0 refills | Status: DC
  Filled 2022-11-20: qty 180, 90d supply, fill #0

## 2022-11-20 MED ORDER — OLMESARTAN MEDOXOMIL 40 MG PO TABS
40.0000 mg | ORAL_TABLET | Freq: Every day | ORAL | 1 refills | Status: DC
  Filled 2022-11-20: qty 30, 30d supply, fill #0
  Filled 2022-12-31: qty 30, 30d supply, fill #1

## 2023-01-13 ENCOUNTER — Encounter: Payer: Self-pay | Admitting: Cardiovascular Disease

## 2023-01-13 NOTE — Progress Notes (Unsigned)
Cardiology Office Note   Date:  01/14/2023   ID:  Bora, Ursini June 04, 1976, MRN [id number]  PCP:  Danelle Berry, PA-C  Cardiologist:   Kristeen Miss, MD   Chief Complaint  Patient presents with   Hyperlipidemia   1. Hypertension 2. Chest pain - had a negative stress echo in Jan. 2016 3.  Hypothyroidism 4.  Obstructive sleep apnea 5.  Adrenal insufficiency   Mr. Eduardo is a 47 who is referred by Dr. Wonda Cheng for evaluation of his CP.  He has a hx of HTN and also has hypoadrenalism, hypothyroidism, low testosterone.  CP is just left of center.  Pain seems to be related to emotional stress.  Starts with dyspnea.  He would typically get shortness of breath, head ache and then get chest pain. Would take another losartan and all the symptoms would improve after an hour. Associated with exertion - walking .  Seems to be worse in the afternoon  He snores at night. Wakes up gasping for air frequently. Has been referred for sleep study.  He did not make the appt.  Does not get any regular exercise.    Has low energy due to his endocrine issues. Has had a head MRI. pituitary gland was ok. Has had cholesterol checked in the past - was borderline years ago.   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    Works also as a Naval architect.  Was not able to complete his last training session because of shortness of breath.   September 20, 2014:  SYAIR TRAVAGLINI is a 47 y.o. male who presents for follow-up for his chest pain. He had an echocardiogram that revealed normal left ventricle systolic function. He had a stress echo ( results are not found at this time) He has not been on his BP med.   BP has been higher .    No further CP,  Not exercising at all.   December 13, 2014:  Jayleen had some chest pain - mid sternal, radiated through his back Worsened with deep breath / deep exhale Went to the [facility]  ER  Work up was negative  Has felt fine since that time.   Has been working out vigorously without any chest   December 05, 2015 Eldwin is seen for follow up .   Has had elevated BP . Has been painting his bathroom, gets sweaty, red faced. Has some chest tightness.  Has symptoms of sleep apnea. Snores, wakes up snoring,  Not getting any other exercise  Trying to stay away from fast foods.  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  Has gained 9 lbs in the past 6 months    Oct. 17, 2017: Darcell is seen back today  He was diagnosed with adrenal insufficiency approximate one year ago. He's been on Cortef and Florinef since that time. Recently he's been having problems with high blood pressures. He tries to watch his salt intake. 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  Feels dehydrated all of the time.   Drinks lots of fluids all day long and then has to urinate all night long .   Jan. 12, 2018:   Detrich is seen today for  follow-up visit. Has gained weight - 10 lbs since Oct. 2017. Had microdisc surgery 8 weeks ago. He had a ruptured disc between L5 and S1. He's back to work. He's not necessarily exercising much.  He takes the Florinef every 2-3 days .  Has cut out lots of his salty snacks  Has a poor appetite recently .   Has OSA,  Is going to get CPAP within the month   July 24, 2017:   doing ok Restarting back in his exercise  Working as a Journalist, newspaper for [employer] Has an ammonia smell to his sweat .   July 25, 2018: Recardo is doing fairly well.  He has a history of hypertension and obstructive sleep apnea.  He also has a history of adrenal insufficiency.  He is on Cortef and Florinef.  Had another back surgery .  No recent CP  BP looks good ,  Does not check at home Has to eat extra salt due to his adrenal insufficiency Especially during  the summer   Dec. 9, 2020  Rhydian is seen for follow up for his hypertension.  He has a history of adrenal insufficiency.  Has questions about his [place]. And HTN associated with that  He has been taking double dose Florinef and his BP inceased.  Is exercising some .   Is playing some disc golf.   Unable to walk continuously .    Wt is 306 lbs.     Wt Readings from Last 3 Encounters:  01/14/23 (!) 307 lb 12.8 oz (139.6 kg)  07/27/22 (!) 310 lb (140.6 kg)  07/21/21 (!) 306 lb 12.8 oz (139.2 kg)    July 21, 2021: Tyvion is seen today for follow up visit  Wt today is 307 lbs.  Has some muscle weakness.  Dos not get enough exercise    Jan 14, 2023 Welden is seen for follow up of his HTN, adrenal insufficiency  Wt is 307.   No CP or dyspnea   Check liipds , BMP today     Past Medical History:  Diagnosis Date   Acute lumbar radiculopathy (Right) (S1) 05/26/2016   Acute myofascial pain 05/04/2017   Acute postoperative pain 06/22/2019   Adrenal insufficiency (HCC)    Anxiety    Anxiety disorder 01/28/2015   Arthritis    Back pain, chronic 06/03/2015   Hypertension    Hypothyroid    Sleep apnea    Thyroid disease     Past Surgical History:  Procedure Laterality Date   BACK SURGERY     COLONOSCOPY WITH PROPOFOL N/A 02/23/2019   Procedure: COLONOSCOPY WITH PROPOFOL;  Surgeon: Pasty Spillers, MD;  Location: [facility] ENDOSCOPY;  Service: Endoscopy;  Laterality: N/A;   discectomy N/A 2017, 2018, 2019   x3  L5-S1   ELBOW SURGERY     right elbow   ESOPHAGOGASTRODUODENOSCOPY (EGD) WITH PROPOFOL N/A 08/15/2020   Procedure: ESOPHAGOGASTRODUODENOSCOPY (EGD) WITH PROPOFOL;  Surgeon: [name], MD;  Location: [facility] ENDOSCOPY;  Service: Endoscopy;  Laterality: N/A;     Current Outpatient Medications  Medication Sig Dispense Refill   carvedilol (COREG) 6.25 MG tablet Take 1 tablet (6.25 mg total) by mouth 2 (two) times daily. 60 tablet 1   fludrocortisone (FLORINEF) 0.1 MG  tablet 1 tablet once a day orally 30 Orally Twice a day 30 days 60 tablet 12   hydroxychloroquine (PLAQUENIL) 200 MG tablet Take 2 tablets (400 mg total) by mouth daily. 180 tablet 0   levothyroxine (  SYNTHROID) 125 MCG tablet 1 tablet in the morning on an empty stomach Orally Once a day 30 day(s) 30 tablet 6   NEEDLE, DISP, 22 G 22G X 1-1/2" MISC use every 2 weeks 25 each 2   olmesartan (BENICAR) 40 MG tablet Take 1 tablet (40 mg total) by mouth daily. 30 tablet 1   omeprazole (PRILOSEC) 40 MG capsule Take one pill once daily 30 minutes prior to last meal of the day. 90 capsule 3   predniSONE (DELTASONE) 5 MG tablet Take 2 tablets by mouth every morning and 1 tablet every evening. 360 tablet 4   rosuvastatin (CRESTOR) 10 MG tablet Take 1 tablet (10 mg total) by mouth daily. 30 tablet 1   sildenafil (VIAGRA) 100 MG tablet Take 1 tablet (100 mg total) by mouth daily as needed. 60 tablet 4   testosterone cypionate (DEPOTESTOSTERONE CYPIONATE) 200 MG/ML injection Inject 0.5 mLs (100 mg total) into the muscle every 14 (fourteen) days. 1 mL 5   Vitamin D, Ergocalciferol, (DRISDOL) 1.25 MG (50000 UNIT) CAPS capsule 1 capsule Orally Once a week 30 day(s) 4 capsule 4   Vitamin D, Ergocalciferol, (DRISDOL) 50000 units CAPS capsule Take 50,000 Units by mouth once a week.  2   No current facility-administered medications for this visit.    Allergies:   Borax and Testosterone    Social History:  The patient  reports that he quit smoking about 27 years ago. His smoking use included cigarettes. He quit smokeless tobacco use about 27 years ago. He reports current alcohol use. He reports that he does not use drugs.   Family History:  The patient's family history includes Heart attack (age of onset: 41) in his father; Hypertension in his father and sister; Osteoarthritis in his mother.    ROS: As noted in the current history.  All other systems are negative.   Physical Exam: Blood pressure 132/85, pulse  95, height 6\' 2"  (1.88 m), weight (!) 307 lb 12.8 oz (139.6 kg), SpO2 98 %.       GEN:  Well nourished, well developed in no acute distress HEENT: Normal NECK: No JVD; No carotid bruits LYMPHATICS: No lymphadenopathy CARDIAC: RRR , no murmurs, rubs, gallops RESPIRATORY:  Clear to auscultation without rales, wheezing or rhonchi  ABDOMEN: Soft, non-tender, non-distended MUSCULOSKELETAL:  No edema; No deformity  SKIN: Warm and dry NEUROLOGIC:  Alert and oriented x 3   EKG:     Jan 14 2023  NSR at 95.  Occasional  PVC     Recent Labs: No results found for requested labs within last 365 days.    Lipid Panel    Component Value Date/Time   CHOL 245 (H) 07/21/2021 1351   TRIG 183 (H) 07/21/2021 1351   HDL 40 07/21/2021 1351   CHOLHDL 6.1 (H) 07/21/2021 1351   CHOLHDL 6.6 (H) 11/15/2019 1236   VLDL 58 (H) 03/12/2016 0755   LDLCALC 171 (H) 07/21/2021 1351   LDLCALC 151 (H) 11/15/2019 1236      Wt Readings from Last 3 Encounters:  01/14/23 (!) 307 lb 12.8 oz (139.6 kg)  07/27/22 (!) 310 lb (140.6 kg)  07/21/21 (!) 306 lb 12.8 oz (139.2 kg)      Other studies Reviewed: Additional studies/ records that were reviewed today include:  Stress echo and resting echo . Review of the above records demonstrates: normal LV function    ASSESSMENT AND PLAN:  1. Hypertension -    overall ok .  2.  Morbid obesity :   advised weight loss   3.  Hypothyroidism -     4. ? Obstructive sleep apnea -       5. Adrenal insufficiency:.       6.. hyperlipidemia :   LDL is elevated.  Check labs today    Current medicines are reviewed at length with the patient today.  The patient does not have concerns regarding medicines.  The following changes have been made: No changes.        Signed, Kristeen Miss, MD  01/14/2023 3:56 PM    [facility] [address] [place], [place], [place]  [address] Phone: [phone]; Fax: [phone]

## 2023-01-14 ENCOUNTER — Ambulatory Visit: Payer: Medicare Other | Attending: Cardiovascular Disease | Admitting: Cardiovascular Disease

## 2023-01-14 ENCOUNTER — Encounter: Payer: Self-pay | Admitting: Cardiovascular Disease

## 2023-01-14 VITALS — BP 132/85 | HR 95 | Ht 74.0 in | Wt 307.8 lb

## 2023-01-14 DIAGNOSIS — E785 Hyperlipidemia, unspecified: Secondary | ICD-10-CM | POA: Diagnosis not present

## 2023-01-14 DIAGNOSIS — I10 Essential (primary) hypertension: Secondary | ICD-10-CM | POA: Insufficient documentation

## 2023-01-14 NOTE — Patient Instructions (Signed)
Medication Instructions:  Your physician recommends that you continue on your current medications as directed. Please refer to the Current Medication list given to you today.  *If you need a refill on your cardiac medications before your next appointment, please call your pharmacy*   Lab Work: Lipids, ALT, BMET today If you have labs (blood work) drawn today and your tests are completely normal, you will receive your results only by: MyChart Message (if you have MyChart) OR A paper copy in the mail If you have any lab test that is abnormal or we need to change your treatment, we will call you to review the results.   Testing/Procedures: NONE   Follow-Up: At [facility], you and your health needs are our priority.  As part of our continuing mission to provide you with exceptional heart care, we have created designated Provider Care Teams.  These Care Teams include your primary Cardiologist (physician) and Advanced Practice Providers (APPs -  Physician Assistants and Nurse Practitioners) who all work together to provide you with the care you need, when you need it.  We recommend signing up for the patient portal called "MyChart".  Sign up information is provided on this After Visit Summary.  MyChart is used to connect with patients for Virtual Visits (Telemedicine).  Patients are able to view lab/test results, encounter notes, upcoming appointments, etc.  Non-urgent messages can be sent to your provider as well.   To learn more about what you can do with MyChart, go to [email or web address].    Your next appointment:   1 year(s)  Provider:   Philip Nahser, MD      

## 2023-01-15 LAB — BASIC METABOLIC PANEL
BUN/Creatinine Ratio: 12 (ref 9–20)
BUN: 13 mg/dL (ref 6–24)
CO2: 22 mmol/L (ref 20–29)
Calcium: 9.5 mg/dL (ref 8.7–10.2)
Chloride: 104 mmol/L (ref 96–106)
Creatinine, Ser: 1.13 mg/dL (ref 0.76–1.27)
Glucose: 119 mg/dL — ABNORMAL HIGH (ref 70–99)
Potassium: 4.2 mmol/L (ref 3.5–5.2)
Sodium: 142 mmol/L (ref 134–144)
eGFR: 80 mL/min/{1.73_m2} (ref >59)

## 2023-01-15 LAB — LIPID PANEL
Chol/HDL Ratio: 3.8 ratio (ref 0.0–5.0)
Cholesterol, Total: 179 mg/dL (ref 100–199)
HDL: 47 mg/dL (ref >39)
LDL Chol Calc (NIH): 111 mg/dL — ABNORMAL HIGH (ref 0–99)
Triglycerides: 118 mg/dL (ref 0–149)
VLDL Cholesterol Cal: 21 mg/dL (ref 5–40)

## 2023-01-15 LAB — ALT: ALT: 43 IU/L (ref 0–44)

## 2023-01-26 ENCOUNTER — Other Ambulatory Visit: Payer: Self-pay | Admitting: Family Medicine

## 2023-01-26 ENCOUNTER — Other Ambulatory Visit: Payer: Self-pay | Admitting: Cardiovascular Disease

## 2023-01-26 ENCOUNTER — Other Ambulatory Visit: Payer: Self-pay

## 2023-01-26 MED ORDER — LEVOTHYROXINE SODIUM 125 MCG PO TABS
125.0000 ug | ORAL_TABLET | Freq: Every morning | ORAL | 6 refills | Status: DC
  Filled 2023-01-26: qty 30, 30d supply, fill #0
  Filled 2023-02-25: qty 30, 30d supply, fill #1
  Filled 2023-04-09: qty 30, 30d supply, fill #2
  Filled 2023-07-21: qty 30, 30d supply, fill #3
  Filled 2023-08-17: qty 30, 30d supply, fill #4
  Filled 2023-09-17: qty 30, 30d supply, fill #5
  Filled 2023-11-09: qty 30, 30d supply, fill #6

## 2023-01-26 MED ORDER — OLMESARTAN MEDOXOMIL 40 MG PO TABS
40.0000 mg | ORAL_TABLET | Freq: Every day | ORAL | 11 refills | Status: DC
  Filled 2023-01-26: qty 30, 30d supply, fill #0
  Filled 2023-02-25: qty 30, 30d supply, fill #1
  Filled 2023-04-09: qty 30, 30d supply, fill #2
  Filled 2023-05-14: qty 30, 30d supply, fill #3
  Filled 2023-06-16: qty 30, 30d supply, fill #4
  Filled 2023-07-21: qty 30, 30d supply, fill #5
  Filled 2023-08-17: qty 30, 30d supply, fill #6
  Filled 2023-09-17: qty 30, 30d supply, fill #7
  Filled 2023-10-12: qty 30, 30d supply, fill #8
  Filled 2023-11-09: qty 30, 30d supply, fill #9
  Filled 2023-12-22: qty 30, 30d supply, fill #10
  Filled 2024-01-17: qty 30, 30d supply, fill #11

## 2023-01-27 NOTE — Telephone Encounter (Signed)
Lvm for pt to return call to schedule appt per [facility]

## 2023-02-25 ENCOUNTER — Other Ambulatory Visit: Payer: Self-pay

## 2023-02-25 ENCOUNTER — Other Ambulatory Visit (HOSPITAL_BASED_OUTPATIENT_CLINIC_OR_DEPARTMENT_OTHER): Payer: Self-pay

## 2023-02-25 ENCOUNTER — Other Ambulatory Visit: Payer: Self-pay | Admitting: Cardiovascular Disease

## 2023-02-25 MED ORDER — CARVEDILOL 6.25 MG PO TABS
6.2500 mg | ORAL_TABLET | Freq: Two times a day (BID) | ORAL | 3 refills | Status: DC
  Filled 2023-02-25: qty 180, 90d supply, fill #0
  Filled 2023-05-28 (×2): qty 180, 90d supply, fill #1
  Filled 2023-08-24: qty 180, 90d supply, fill #2
  Filled 2023-11-09: qty 180, 90d supply, fill #3

## 2023-02-25 MED ORDER — PREDNISONE 5 MG PO TABS
ORAL_TABLET | ORAL | 4 refills | Status: DC
  Filled 2023-02-25: qty 270, 90d supply, fill #0
  Filled 2023-05-14: qty 270, 90d supply, fill #1
  Filled 2023-07-21 – 2023-07-26 (×2): qty 270, 90d supply, fill #2
  Filled 2023-10-12: qty 270, 90d supply, fill #3
  Filled 2023-12-31: qty 270, 90d supply, fill #4

## 2023-03-21 ENCOUNTER — Other Ambulatory Visit: Payer: Self-pay

## 2023-03-22 ENCOUNTER — Other Ambulatory Visit: Payer: Self-pay

## 2023-03-22 MED ORDER — HYDROXYCHLOROQUINE SULFATE 200 MG PO TABS
400.0000 mg | ORAL_TABLET | Freq: Every day | ORAL | 0 refills | Status: DC
  Filled 2023-03-22: qty 180, 90d supply, fill #0

## 2023-04-09 ENCOUNTER — Other Ambulatory Visit: Payer: Self-pay

## 2023-04-12 ENCOUNTER — Other Ambulatory Visit: Payer: Self-pay

## 2023-04-29 ENCOUNTER — Other Ambulatory Visit: Payer: Self-pay

## 2023-04-30 ENCOUNTER — Other Ambulatory Visit: Payer: Self-pay

## 2023-04-30 MED ORDER — TESTOSTERONE CYPIONATE 200 MG/ML IM SOLN
100.0000 mg | INTRAMUSCULAR | 5 refills | Status: DC
  Filled 2023-04-30: qty 1, 28d supply, fill #0
  Filled 2023-05-28 (×2): qty 1, 28d supply, fill #1
  Filled 2023-06-30: qty 1, 28d supply, fill #2
  Filled 2023-07-21 – 2023-07-26 (×2): qty 1, 28d supply, fill #3
  Filled 2023-08-24: qty 1, 28d supply, fill #4
  Filled 2023-09-17: qty 1, 28d supply, fill #5

## 2023-05-28 ENCOUNTER — Other Ambulatory Visit: Payer: Self-pay

## 2023-06-01 ENCOUNTER — Other Ambulatory Visit: Payer: Self-pay

## 2023-06-01 MED ORDER — FLUDROCORTISONE ACETATE 0.1 MG PO TABS
0.1000 mg | ORAL_TABLET | Freq: Two times a day (BID) | ORAL | 12 refills | Status: DC
  Filled 2023-06-01: qty 60, 30d supply, fill #0
  Filled 2023-06-30: qty 60, 30d supply, fill #1
  Filled 2023-07-26: qty 60, 30d supply, fill #2
  Filled 2023-08-24: qty 60, 30d supply, fill #3
  Filled 2023-09-17: qty 60, 30d supply, fill #4
  Filled 2023-10-12: qty 60, 30d supply, fill #5
  Filled 2023-12-13: qty 60, 30d supply, fill #6
  Filled 2024-01-17: qty 60, 30d supply, fill #7
  Filled 2024-02-14: qty 60, 30d supply, fill #8
  Filled 2024-03-27: qty 60, 30d supply, fill #9
  Filled 2024-04-26: qty 60, 30d supply, fill #10
  Filled 2024-05-26: qty 60, 30d supply, fill #11

## 2023-06-02 ENCOUNTER — Other Ambulatory Visit: Payer: Self-pay

## 2023-06-03 ENCOUNTER — Other Ambulatory Visit: Payer: Self-pay

## 2023-06-04 ENCOUNTER — Other Ambulatory Visit: Payer: Self-pay | Admitting: Family Medicine

## 2023-06-04 ENCOUNTER — Other Ambulatory Visit: Payer: Self-pay

## 2023-06-04 NOTE — Telephone Encounter (Signed)
Called pt no answer left detailed vm.

## 2023-06-07 ENCOUNTER — Other Ambulatory Visit: Payer: Self-pay

## 2023-06-07 MED ORDER — SILDENAFIL CITRATE 100 MG PO TABS
100.0000 mg | ORAL_TABLET | ORAL | 4 refills | Status: DC
  Filled 2023-06-07: qty 4, 30d supply, fill #0
  Filled 2023-07-21: qty 4, 30d supply, fill #1
  Filled 2023-09-17: qty 4, 30d supply, fill #2
  Filled 2023-12-13: qty 4, 30d supply, fill #3
  Filled 2024-01-17: qty 4, 30d supply, fill #4
  Filled 2024-03-27: qty 4, 30d supply, fill #5
  Filled 2024-04-26: qty 4, 30d supply, fill #6
  Filled 2024-05-26: qty 4, 30d supply, fill #7

## 2023-06-30 ENCOUNTER — Other Ambulatory Visit: Payer: Self-pay

## 2023-07-01 ENCOUNTER — Other Ambulatory Visit: Payer: Self-pay

## 2023-07-02 ENCOUNTER — Other Ambulatory Visit: Payer: Self-pay

## 2023-07-02 MED ORDER — HYDROXYCHLOROQUINE SULFATE 200 MG PO TABS
400.0000 mg | ORAL_TABLET | Freq: Every day | ORAL | 0 refills | Status: DC
  Filled 2023-07-02: qty 180, 90d supply, fill #0

## 2023-07-21 ENCOUNTER — Other Ambulatory Visit: Payer: Self-pay

## 2023-07-26 ENCOUNTER — Other Ambulatory Visit: Payer: Self-pay

## 2023-07-30 ENCOUNTER — Other Ambulatory Visit: Payer: Self-pay

## 2023-08-25 ENCOUNTER — Other Ambulatory Visit: Payer: Self-pay

## 2023-10-03 DIAGNOSIS — K5792 Diverticulitis of intestine, part unspecified, without perforation or abscess without bleeding: Secondary | ICD-10-CM | POA: Insufficient documentation

## 2023-10-03 DIAGNOSIS — E039 Hypothyroidism, unspecified: Secondary | ICD-10-CM | POA: Insufficient documentation

## 2023-10-05 ENCOUNTER — Telehealth: Payer: Self-pay

## 2023-10-05 NOTE — Telephone Encounter (Signed)
 The patient wife Judeth Cornfield) called in to schedule her husband an ED follow up. His is schedule with Dr. Tobi Bastos for 11/29/23 at 2:15 PM.

## 2023-10-12 ENCOUNTER — Other Ambulatory Visit: Payer: Self-pay

## 2023-10-12 DIAGNOSIS — R1310 Dysphagia, unspecified: Secondary | ICD-10-CM

## 2023-10-13 ENCOUNTER — Other Ambulatory Visit: Payer: Self-pay

## 2023-10-13 MED ORDER — TESTOSTERONE CYPIONATE 200 MG/ML IM SOLN
100.0000 mg | INTRAMUSCULAR | 5 refills | Status: DC
  Filled 2023-10-15: qty 1, 28d supply, fill #0
  Filled 2023-11-09: qty 1, 28d supply, fill #1
  Filled 2023-12-13: qty 1, 28d supply, fill #2
  Filled 2024-01-17: qty 1, 28d supply, fill #3
  Filled 2024-02-17: qty 1, 28d supply, fill #4
  Filled 2024-03-13: qty 1, 14d supply, fill #5

## 2023-10-14 ENCOUNTER — Other Ambulatory Visit: Payer: Self-pay

## 2023-10-14 MED ORDER — PEG-3350/ELECTROLYTES 236 G PO SOLR
4000.0000 mL | Freq: Once | ORAL | 0 refills | Status: AC
  Filled 2023-10-14: qty 4000, 1d supply, fill #0

## 2023-10-15 ENCOUNTER — Other Ambulatory Visit: Payer: Self-pay

## 2023-11-10 ENCOUNTER — Other Ambulatory Visit: Payer: Self-pay

## 2023-11-24 ENCOUNTER — Encounter: Payer: Self-pay | Admitting: Nurse Practitioner

## 2023-11-24 ENCOUNTER — Ambulatory Visit (INDEPENDENT_AMBULATORY_CARE_PROVIDER_SITE_OTHER): Payer: Medicare Other | Admitting: Nurse Practitioner

## 2023-11-24 VITALS — BP 179/101 | HR 65 | Temp 98.4°F | Ht 74.0 in | Wt 319.4 lb

## 2023-11-24 DIAGNOSIS — E039 Hypothyroidism, unspecified: Secondary | ICD-10-CM

## 2023-11-24 DIAGNOSIS — G4733 Obstructive sleep apnea (adult) (pediatric): Secondary | ICD-10-CM

## 2023-11-24 DIAGNOSIS — R41840 Attention and concentration deficit: Secondary | ICD-10-CM | POA: Insufficient documentation

## 2023-11-24 DIAGNOSIS — M0579 Rheumatoid arthritis with rheumatoid factor of multiple sites without organ or systems involvement: Secondary | ICD-10-CM

## 2023-11-24 DIAGNOSIS — I10 Essential (primary) hypertension: Secondary | ICD-10-CM | POA: Diagnosis not present

## 2023-11-24 DIAGNOSIS — G894 Chronic pain syndrome: Secondary | ICD-10-CM

## 2023-11-24 DIAGNOSIS — E2740 Unspecified adrenocortical insufficiency: Secondary | ICD-10-CM

## 2023-11-24 DIAGNOSIS — K5792 Diverticulitis of intestine, part unspecified, without perforation or abscess without bleeding: Secondary | ICD-10-CM | POA: Diagnosis not present

## 2023-11-24 DIAGNOSIS — K219 Gastro-esophageal reflux disease without esophagitis: Secondary | ICD-10-CM

## 2023-11-24 DIAGNOSIS — E782 Mixed hyperlipidemia: Secondary | ICD-10-CM

## 2023-11-24 DIAGNOSIS — Z6841 Body Mass Index (BMI) 40.0 and over, adult: Secondary | ICD-10-CM

## 2023-11-24 NOTE — Progress Notes (Signed)
 Dean Burkitt, NP-C Phone: [phone]  Dean Ford is a 48 y.o. male who presents today to establish care.   Discussed the use of AI scribe software for clinical note transcription with the patient, who gave verbal consent to proceed.  History of Present Illness   Dean Ford is a 48 year old male with rheumatoid arthritis and adrenal insufficiency who presents to establish care and for a comprehensive review of his medical conditions.  He has rheumatoid arthritis, which he manages by limiting sugar intake. His symptoms have significantly improved after cutting out sugar, and he now only takes hydroxychloroquine as needed, particularly if activity causes inflammation in his hands.  He has adrenal insufficiency and is currently on prednisone and fludrocortisone. He also receives testosterone therapy and levothyroxine for hypothyroidism. No heart palpitations or temperature regulation issues are noted, but he mentions that his blood pressure and heat levels increase when his blood pressure rises.  He has a history of back pain due to three discectomies, resulting in chronic pain and sensitivity issues. He no longer sees pain management and reports that his pain is constant but manageable. He attributes some improvement to a spiritual experience and has adapted to managing his activity levels to cope with the pain.  He experienced a perforated colon due to diverticulitis, which was treated with antibiotics. He is scheduled for a colonoscopy in May and has not had any further issues since the initial event. He recalls a similar episode in his twenties related to poppy seed consumption but without a formal diagnosis at that time.  He manages his blood pressure with carvedilol 6.25 mg twice daily and olmesartan 40 mg once daily. He does not regularly check his blood pressure at home but notes that it can be affected by pain and long drives. He has stopped taking Crestor due to muscle aches.  He  suspects he has ADHD, influenced by his brother's diagnosis and his own experiences of procrastination and difficulty initiating tasks. He has adapted by relying on his wife for assistance with appointments and tasks. He has not pursued formal evaluation or treatment for ADHD.  He experiences sleep disturbances, averaging four to six hours of sleep per night, and uses a CPAP machine. He has not felt well-rested in years and attributes some of his sleep issues to thyroid and adrenal problems. He has a history of using trazodone for sleep but is not currently on any sleep medications.  He takes omeprazole 40 mg daily for acid reflux, which he reports is effective. He has a history of esophageal stricture and gastroparesis, which have been managed with dietary changes and medication.  He has recently started taking methylene blue and reports subjective improvement in numbness in his right leg and hand, though he is uncertain if this is a placebo effect. He also takes vitamin D and a supplement with creatine and peptides for muscle recovery.      Active Ambulatory Problems    Diagnosis Date Noted   Essential hypertension 07/20/2014   Chest discomfort 07/20/2014   Hypothyroidism 07/20/2014   Adrenal insufficiency (HCC) 07/20/2014   Mixed dyslipidemia 03/01/2015   Long term current use of opiate analgesic 06/03/2015   Uncomplicated opioid dependence (HCC) 06/03/2015   Opiate use 06/03/2015   Lumbar spondylosis 06/03/2015   Lumbar facet syndrome (Bilateral) (R>L) 06/03/2015   Insomnia, persistent 06/03/2015   Intermittent muscle cramps 06/03/2015   Avitaminosis D 06/03/2015   Lumbar spine pain 06/03/2015   Hip arthrosis 06/03/2015  Class 2 severe obesity due to excess calories with serious comorbidity and body mass index (BMI) of 38.0 to 38.9 in adult [facility]) 06/03/2015   DDD (degenerative disc disease), lumbar 06/03/2015   L4-L5 disc bulge 06/03/2015   Ligamentum flavum hypertrophy 06/03/2015    Bulging lumbar disc 06/03/2015   OSA (obstructive sleep apnea) 06/03/2015   Chronic sacroiliac joint pain (Bilateral) (R>L) 06/11/2015   Lumbar facet hypertrophy (Multilevel) 06/11/2015   Lumbosacral radiculopathy at S1 (right-sided) 06/11/2015   Chronic pain syndrome 09/02/2015   Long term prescription opiate use 09/02/2015   Chronic low back pain (Primary Area of Pain) (Bilateral) (R>L) 12/18/2015   Erectile dysfunction 12/18/2015   Spasm of paraspinal muscle 01/22/2016   Lower extremity numbness (Bilateral) (R>L) 01/22/2016   Chronic lumbar radicular pain (Right: S1, Bilateral: L5) (Bilateral) (R>L) 01/22/2016   Chronic hip pain (Right) 01/22/2016   Osteoarthritis of hip (Right) 01/22/2016   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/2016   Spondylosis without myelopathy or radiculopathy, lumbosacral region 02/08/2018   Chronic lower extremity pain (Secondary Area of Pain) (Bilateral) (R>L) 02/08/2018   Chronic musculoskeletal pain 04/25/2018   Hypogonadism male 09/28/2018   Rheumatoid arthritis (HCC) 09/28/2018   Carpal tunnel syndrome 09/28/2018   Pharmacologic therapy 02/09/2019   Disorder of skeletal system 02/09/2019   Problems influencing health status 02/09/2019   Rectal polyp    Diverticulosis of large intestine without diverticulitis    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) 05/01/2019   DDD (degenerative disc disease), lumbosacral 06/22/2019   Neurogenic pain 07/03/2019   History of illicit drug use 05/18/2020   History of marijuana use 05/18/2020   Pain medication agreement broken 05/18/2020   Opioid-induced hyperalgesia 05/22/2020   Nondiabetic gastroparesis 05/22/2020   Gastroesophageal reflux disease 07/26/2020   Stricture and stenosis of esophagus    Attention or concentration deficit 11/24/2023   Diverticulitis 12/02/2023   Morbid obesity with BMI of 40.0-44.9, adult (HCC) 12/02/2023    Resolved Ambulatory Problems    Diagnosis Date Noted   Anxiety disorder 01/28/2015   Dehydration symptoms 01/28/2015   Acute left flank pain 03/01/2015   Encounter for therapeutic drug level monitoring 06/03/2015   Acute low back pain 06/03/2015   Always thirsty 06/03/2015   Encounter for chronic pain management 09/02/2015   Acute lumbar radiculopathy (Right) (S1) 05/26/2016   Acute myofascial pain 05/04/2017   Occult blood in stools    Acute postoperative pain 06/22/2019   Problems with swallowing and mastication    Gastritis without bleeding    Past Medical History:  Diagnosis Date   Anxiety    Arthritis    Back pain, chronic 06/03/2015   Hypertension    Hypothyroid    Sleep apnea    Thyroid disease     Family History  Problem Relation Age of Onset   Osteoarthritis Mother    Heart attack Father 23   Hypertension Father    Hypertension Sister     Social History   Socioeconomic History   Marital status: Married    Spouse name: Stephaine   Number of children: 1   Years of education: Not on file   Highest education level: Not on file  Occupational History   Not on file  Tobacco Use   Smoking status: Former    Current packs/day: 0.00    Types: Cigarettes    Quit date: 11/30/1995    Years since quitting: 28.0   Smokeless tobacco: Former  Quit date: 11/30/1995  Vaping Use   Vaping status: Never Used  Substance and Sexual Activity   Alcohol use: Yes    Alcohol/week: 0.0 standard drinks of alcohol    Comment: rarely   Drug use: No   Sexual activity: Yes    Partners: Female  Other Topics Concern   Not on file  Social History Narrative   Not on file   Social Drivers of Health   Financial Resource Strain: Low Risk  (06/23/2018)   Overall Financial Resource Strain (CARDIA)    Difficulty of Paying Living Expenses: Not hard at all  Food Insecurity: No Food Insecurity (06/23/2018)   Hunger Vital Sign    Worried About Running Out of Food in the Last Year:  Never true    Ran Out of Food in the Last Year: Never true  Transportation Needs: No Transportation Needs (06/23/2018)   PRAPARE - Administrator, Civil Service (Medical): No    Lack of Transportation (Non-Medical): No  Physical Activity: Insufficiently Active (09/26/2018)   Exercise Vital Sign    Days of Exercise per Week: 3 days    Minutes of Exercise per Session: 30 min  Stress: No Stress Concern Present (06/23/2018)   [employer] of Occupational Health - Occupational Stress Questionnaire    Feeling of Stress : Not at all  Social Connections: Somewhat Isolated (06/23/2018)   Social Connection and Isolation Panel [NHANES]    Frequency of Communication with Friends and Family: More than three times a week    Frequency of Social Gatherings with Friends and Family: More than three times a week    Attends Religious Services: Never    Database administrator or Organizations: No    Attends Banker Meetings: Never    Marital Status: Married  Catering manager Violence: Not At Risk (06/23/2018)   Humiliation, Afraid, Rape, and Kick questionnaire    Fear of Current or Ex-Partner: No    Emotionally Abused: No    Physically Abused: No    Sexually Abused: No    ROS  General:  Negative for unexplained weight loss, fever Skin: Negative for new or changing mole, sore that won't heal HEENT: Negative for trouble hearing, trouble seeing, ringing in ears, mouth sores, hoarseness, change in voice, dysphagia. CV:  Negative for chest pain, dyspnea, edema, palpitations Resp: Negative for cough, dyspnea, hemoptysis GI: Negative for nausea, vomiting, diarrhea, constipation, abdominal pain, melena, hematochezia. GU: Negative for dysuria, incontinence, urinary hesitance, hematuria, vaginal or penile discharge, polyuria, sexual difficulty, lumps in testicle or breasts MSK: Negative for muscle cramps or aches, joint pain or swelling Neuro: Negative for headaches, weakness,  numbness, dizziness, passing out/fainting Psych: Negative for depression, anxiety, memory problems  Objective  Physical Exam Vitals:   11/24/23 1445 11/24/23 1610  BP: (!) 184/115 (!) 179/101  Pulse: 65   Temp: 98.4 F (36.9 C)   SpO2: 94%     BP Readings from Last 3 Encounters:  11/26/23 122/82  11/24/23 (!) 179/101  01/14/23 132/85   Wt Readings from Last 3 Encounters:  11/26/23 (!) 319 lb (144.7 kg)  11/24/23 (!) 319 lb 6.4 oz (144.9 kg)  01/14/23 (!) 307 lb 12.8 oz (139.6 kg)    Physical Exam Constitutional:      General: He is not in acute distress.    Appearance: Normal appearance.  HENT:     Head: Normocephalic.  Cardiovascular:     Rate and Rhythm: Normal rate and regular rhythm.  Heart sounds: Normal heart sounds.  Pulmonary:     Effort: Pulmonary effort is normal.     Breath sounds: Normal breath sounds.  Skin:    General: Skin is warm and dry.  Neurological:     General: No focal deficit present.     Mental Status: He is alert.  Psychiatric:        Mood and Affect: Mood normal.        Behavior: Behavior normal.      Assessment/Plan:   Essential hypertension Assessment & Plan: Blood pressure is elevated x 2 readings today in office. Patient reports normally well-controlled with carvedilol and olmesartan, though elevated today likely due to stress and pain after driving 7 hours to appointment. Noted to be adequately controlled on chart review. Continue carvedilol 6.25 mg twice daily and olmesartan 40 mg once daily. He will continue to monitor his blood pressure regularly at home and contact if remaining consistently elevated.    Attention or concentration deficit Assessment & Plan: Symptoms include procrastination and task initiation difficulty, with a history of adverse reaction to Wellbutrin. Discussed stimulant risks and referred to [facility] for evaluation.  Orders: -     Ambulatory referral to  Psychiatry  Diverticulitis Assessment & Plan: There are no current symptoms post-antibiotic treatment for a perforated colon in February. Proceed with the scheduled colonoscopy in May. Follow up with General Surgery/Gastro as scheduled.    Hypothyroidism, unspecified type Assessment & Plan: Labs are stable with levothyroxine, and there are no symptoms of palpitations or temperature issues. Continue levothyroxine as prescribed. Follow up with Endo as scheduled.    Rheumatoid arthritis involving multiple sites with positive rheumatoid factor (HCC) Assessment & Plan: RA symptoms have improved with dietary changes and as-needed hydroxychloroquine, with rheumatologist support for current management. Continue as-needed hydroxychloroquine and maintain reduced sugar intake. Follow up with Rheumatology as scheduled.    OSA (obstructive sleep apnea) Assessment & Plan: Persistent fatigue and poor sleep quality are noted with an outdated CPAP and no pulmonology follow-up. Refer to pulmonology for CPAP evaluation and management.  Orders: -     Pulmonary Visit  Adrenal insufficiency (HCC) Assessment & Plan: This condition is managed with prednisone and fludrocortisone under endocrinologist supervision. Continue these medications as prescribed. Follow up with Endo as scheduled.    Chronic pain syndrome Assessment & Plan: Persistent pain post-discectomies is managed with lifestyle adaptations. Continue current lifestyle adaptations for pain management.   Gastroesophageal reflux disease, unspecified whether esophagitis present Assessment & Plan: Managed with omeprazole, with no current symptoms. Continue omeprazole 40 mg daily. Advised to monitor diet for triggers.    Morbid obesity with BMI of 40.0-44.9, adult [facility]) Assessment & Plan: Discussed dietary changes, weight loss benefits, and potential GLP-1 agonists use. Consider the AIP diet for inflammation management. Consider use of GLP-1  agonists for weight loss. He will contact if interested in starting. Encourage healthy diet and exercise.      Return in about 6 months (around 05/25/2024) for Follow up.   Dean Burkitt, NP-C [facility]

## 2023-11-26 ENCOUNTER — Ambulatory Visit: Admitting: Sleep Medicine

## 2023-11-26 ENCOUNTER — Encounter: Payer: Self-pay | Admitting: Sleep Medicine

## 2023-11-26 VITALS — BP 122/82 | HR 67 | Temp 96.9°F | Ht 74.0 in | Wt 319.0 lb

## 2023-11-26 DIAGNOSIS — I10 Essential (primary) hypertension: Secondary | ICD-10-CM | POA: Diagnosis not present

## 2023-11-26 DIAGNOSIS — E039 Hypothyroidism, unspecified: Secondary | ICD-10-CM

## 2023-11-26 DIAGNOSIS — G4733 Obstructive sleep apnea (adult) (pediatric): Secondary | ICD-10-CM

## 2023-11-26 NOTE — Patient Instructions (Signed)
 Marland Kitchen

## 2023-11-26 NOTE — Progress Notes (Signed)
 Name:Dean Ford MRN: [id number] DOB: 1975/11/02   CHIEF COMPLAINT:  EXCESSIVE DAYTIME SLEEPINESS   HISTORY OF PRESENT ILLNESS:  Dean Ford is a 48 y.o. w/ a h/o OSA, HTN, hypothyroidism, hyperlipidemia, GERD and morbid obesity who presents to establish care for OSA. Reports that he was initially diagnosed with OSA in 2017 and subsequently started on CPAP therapy. Reports using CPAP therapy every night and states that he usually does not awaken feeling refreshed. Reports difficulty staying asleep due to back pain and has trouble falling back to sleep. Denies any significant weight changes. Reports occasional morning headaches. Denies RLS symptoms, dream enactment, cataplexy, hypnagogic or hypnapompic hallucinations. Reports a family history of sleep apnea. Reports occasional drowsy driving. Drinks 1-2 cups of decaf coffee daily, denies alcohol, tobacco or illicit drug use.   Bedtime 1-3 am Sleep onset 5 mins Rise time 6-8 am   EPWORTH SLEEP SCORE 12     No data to display          PAST MEDICAL HISTORY :   has a past medical history of Acute lumbar radiculopathy (Right) (S1) (05/26/2016), Acute myofascial pain (05/04/2017), Acute postoperative pain (06/22/2019), Adrenal insufficiency (HCC), Anxiety, Anxiety disorder (01/28/2015), Arthritis, Back pain, chronic (06/03/2015), Hypertension, Hypothyroid, Sleep apnea, and Thyroid disease.  has a past surgical history that includes Elbow surgery; Back surgery; Colonoscopy with propofol (N/A, 02/23/2019); discectomy (N/A, 2017, 2018, 2019); and Esophagogastroduodenoscopy (egd) with propofol (N/A, 08/15/2020). Prior to Admission medications   Medication Sig Start Date End Date Taking? Authorizing Provider  carvedilol (COREG) 6.25 MG tablet Take 1 tablet (6.25 mg total) by mouth 2 (two) times daily. 02/25/23   Nahser, Deloris Ping, MD  fludrocortisone (FLORINEF) 0.1 MG tablet Take 1 tablet (0.1 mg total) by mouth 2 (two) times daily. 06/01/23      hydroxychloroquine (PLAQUENIL) 200 MG tablet Take 2 tablets (400 mg total) by mouth daily. 07/02/23     levothyroxine (SYNTHROID) 125 MCG tablet Take 1 tablet (125 mcg total) by mouth every morning on an empty stomach 01/26/23     olmesartan (BENICAR) 40 MG tablet Take 1 tablet (40 mg total) by mouth daily. 01/26/23   Nahser, Deloris Ping, MD  omeprazole (PRILOSEC) 40 MG capsule Take one pill once daily 30 minutes prior to last meal of the day. 04/09/22     predniSONE (DELTASONE) 5 MG tablet Take 2 tablets (10 mg total) by mouth every morning AND 1 tablet (5 mg total) every evening. 02/25/23     rosuvastatin (CRESTOR) 10 MG tablet Take 1 tablet (10 mg total) by mouth daily. 11/20/22   Nahser, Deloris Ping, MD  sildenafil (VIAGRA) 100 MG tablet Take 1 tablet (100 mg total) by mouth daily as needed. 06/07/23     SYRINGE-NEEDLE, DISP, 3 ML (B-D 3CC LUER-LOK SYR 23GX1-1/2) 23G X 1-1/2" 3 ML MISC Use every 2 weeks as directed 10/13/22     testosterone cypionate (DEPOTESTOSTERONE CYPIONATE) 200 MG/ML injection Inject 0.5 mLs (100 mg total) into the muscle every 14 (fourteen) days. 10/13/23     Vitamin D, Ergocalciferol, (DRISDOL) 1.25 MG (50000 UNIT) CAPS capsule 1 capsule Orally Once a week 30 day(s) 12/05/21     Oxycodone HCl 10 MG TABS Take 1 tablet (10 mg total) by mouth every 6 (six) hours as needed. Must last 30 days. 04/25/20 05/22/20  Edward Jolly, MD   Allergies  Allergen Reactions   Borax Rash    Borax detergent   Testosterone Rash  FAMILY HISTORY:  family history includes Heart attack (age of onset: 54) in his father; Hypertension in his father and sister; Osteoarthritis in his mother. SOCIAL HISTORY:  reports that he quit smoking about 28 years ago. His smoking use included cigarettes. He quit smokeless tobacco use about 28 years ago. He reports current alcohol use. He reports that he does not use drugs.   Review of Systems:  Gen:  Denies  fever, sweats, chills weight loss  HEENT: Denies blurred  vision, double vision, ear pain, eye pain, hearing loss, nose bleeds, sore throat Cardiac:  No dizziness, chest pain or heaviness, chest tightness,edema, No JVD Resp:   No cough, -sputum production, -shortness of breath,-wheezing, -hemoptysis,  Gi: Denies swallowing difficulty, stomach pain, nausea or vomiting, diarrhea, constipation, bowel incontinence Gu:  Denies bladder incontinence, burning urine Ext:   Denies Joint pain, stiffness or swelling Skin: Denies  skin rash, easy bruising or bleeding or hives Endoc:  Denies polyuria, polydipsia , polyphagia or weight change Psych:   Denies depression, insomnia or hallucinations  Other:  All other systems negative  VITAL SIGNS: BP 122/82 (BP Location: Left Arm, Cuff Size: Large)   Pulse 67   Temp (!) 96.9 F (36.1 C)   Ht 6\' 2"  (1.88 m)   Wt (!) 319 lb (144.7 kg)   SpO2 95%   BMI 40.96 kg/m     Physical Examination:   General Appearance: No distress  EYES PERRLA, EOM intact.   NECK Supple, No JVD Pulmonary: normal breath sounds, No wheezing.  CardiovascularNormal S1,S2.  No m/r/g.   Abdomen: Benign, Soft, non-tender. Skin:   warm, no rashes, no ecchymosis  Extremities: normal, no cyanosis, clubbing. Neuro:without focal findings,  speech normal  PSYCHIATRIC: Mood, affect within normal limits.   ASSESSMENT AND PLAN  OSA Due to patient's CPAP device's motor life expectancy expiring, will complete an updated sleep study and order new CPAP device upon reviewing results. Counseled patient on increasing total sleep time to 7-8 hours per night. Discussed the consequences of untreated sleep apnea. Advised not to drive drowsy for safety of patient and others. Will follow up in 3 months for CPAP efficacy and compliance data.    HTN Stable, on current management. Following with PCP.   Hypothyroidism Stable, on current management.   Morbid obesity Counseled patient on diet and lifestyle modification.    MEDICATION ADJUSTMENTS/LABS  AND TESTS ORDERED: Recommend Sleep Study   Patient  satisfied with Plan of action and management. All questions answered  I spent a total of 45 minutes reviewing chart data, face-to-face evaluation with the patient, counseling and coordination of care as detailed above.    Tempie Hoist, M.D.  Sleep Medicine  Pulmonary & Critical Care Medicine

## 2023-11-29 ENCOUNTER — Ambulatory Visit: Payer: Medicare Other | Admitting: Gastroenterology

## 2023-12-02 ENCOUNTER — Encounter: Payer: Self-pay | Admitting: Nurse Practitioner

## 2023-12-02 DIAGNOSIS — K5792 Diverticulitis of intestine, part unspecified, without perforation or abscess without bleeding: Secondary | ICD-10-CM | POA: Insufficient documentation

## 2023-12-02 NOTE — Assessment & Plan Note (Signed)
 Blood pressure is elevated x 2 readings today in office. Patient reports normally well-controlled with carvedilol and olmesartan, though elevated today likely due to stress and pain after driving 7 hours to appointment. Noted to be adequately controlled on chart review. Continue carvedilol 6.25 mg twice daily and olmesartan 40 mg once daily. He will continue to monitor his blood pressure regularly at home and contact if remaining consistently elevated.

## 2023-12-02 NOTE — Assessment & Plan Note (Signed)
 Managed with omeprazole, with no current symptoms. Continue omeprazole 40 mg daily. Advised to monitor diet for triggers.

## 2023-12-02 NOTE — Assessment & Plan Note (Signed)
 Symptoms include procrastination and task initiation difficulty, with a history of adverse reaction to Wellbutrin. Discussed stimulant risks and referred to [facility] for evaluation.

## 2023-12-02 NOTE — Assessment & Plan Note (Signed)
 Persistent fatigue and poor sleep quality are noted with an outdated CPAP and no pulmonology follow-up. Refer to pulmonology for CPAP evaluation and management.

## 2023-12-02 NOTE — Assessment & Plan Note (Signed)
 Discussed dietary changes, weight loss benefits, and potential GLP-1 agonists use. Consider the AIP diet for inflammation management. Consider use of GLP-1 agonists for weight loss. He will contact if interested in starting. Encourage healthy diet and exercise.

## 2023-12-02 NOTE — Assessment & Plan Note (Addendum)
 Labs are stable with levothyroxine, and there are no symptoms of palpitations or temperature issues. Continue levothyroxine as prescribed. Follow up with Endo as scheduled.

## 2023-12-02 NOTE — Assessment & Plan Note (Signed)
 RA symptoms have improved with dietary changes and as-needed hydroxychloroquine, with rheumatologist support for current management. Continue as-needed hydroxychloroquine and maintain reduced sugar intake. Follow up with Rheumatology as scheduled.

## 2023-12-02 NOTE — Assessment & Plan Note (Signed)
 There are no current symptoms post-antibiotic treatment for a perforated colon in February. Proceed with the scheduled colonoscopy in May. Follow up with General Surgery/Gastro as scheduled.

## 2023-12-02 NOTE — Assessment & Plan Note (Signed)
 Persistent pain post-discectomies is managed with lifestyle adaptations. Continue current lifestyle adaptations for pain management.

## 2023-12-02 NOTE — Assessment & Plan Note (Signed)
 This condition is managed with prednisone and fludrocortisone under endocrinologist supervision. Continue these medications as prescribed. Follow up with Endo as scheduled.

## 2023-12-13 ENCOUNTER — Other Ambulatory Visit: Payer: Self-pay

## 2023-12-13 MED ORDER — LEVOTHYROXINE SODIUM 125 MCG PO TABS
125.0000 ug | ORAL_TABLET | Freq: Every morning | ORAL | 6 refills | Status: DC
  Filled 2023-12-13: qty 30, 30d supply, fill #0
  Filled 2024-01-17: qty 30, 30d supply, fill #1
  Filled 2024-04-12: qty 30, 30d supply, fill #2
  Filled 2024-05-11: qty 30, 30d supply, fill #3
  Filled 2024-06-16: qty 30, 30d supply, fill #4

## 2023-12-22 ENCOUNTER — Ambulatory Visit: Admitting: *Deleted

## 2023-12-22 ENCOUNTER — Telehealth: Payer: Self-pay

## 2023-12-22 VITALS — BP 144/88 | Temp 97.1°F | Ht 74.0 in | Wt 317.0 lb

## 2023-12-22 DIAGNOSIS — Z0000 Encounter for general adult medical examination without abnormal findings: Secondary | ICD-10-CM | POA: Diagnosis not present

## 2023-12-22 NOTE — Telephone Encounter (Signed)
 Bluford Burkitt, NP  Ethleen Lormand, CMA Please have him schedule a follow up with me to discuss.

## 2023-12-22 NOTE — Progress Notes (Signed)
 Subjective:   Dean Ford is a 48 y.o. who presents for a Medicare Wellness preventive visit.  Visit Complete: In person   Persons Participating in Visit: Patient.  AWV Questionnaire: No: Patient Medicare AWV questionnaire was not completed prior to this visit.  Cardiac Risk Factors include: dyslipidemia;hypertension     Objective:    Today's Vitals   12/22/23 0816 12/22/23 0817 12/22/23 0841  BP:  (!) 150/100 (!) 144/88  Temp:  (!) 97.1 F (36.2 C)   TempSrc:  Skin   Weight:  (!) 317 lb (143.8 kg)   Height:  6\' 2"  (1.88 m)   PainSc: 5      Body mass index is 40.7 kg/m.     12/22/2023    8:35 AM 08/15/2020    7:40 AM 06/22/2019    9:15 AM 05/23/2019   10:51 AM 02/23/2019    9:59 AM 06/10/2018   10:11 AM 06/09/2018    4:41 PM  Advanced Directives  Does Patient Have a Medical Advance Directive? No No No No No No No  Would patient like information on creating a medical advance directive? No - Patient declined No - Patient declined No - Patient declined No - Patient declined No - Guardian declined  No - Patient declined    Current Medications (verified) Outpatient Encounter Medications as of 12/22/2023  Medication Sig   carvedilol  (COREG ) 6.25 MG tablet Take 1 tablet (6.25 mg total) by mouth 2 (two) times daily.   cholecalciferol (VITAMIN D3) 25 MCG (1000 UNIT) tablet Take 1,000 Units by mouth daily.   fludrocortisone  (FLORINEF ) 0.1 MG tablet Take 1 tablet (0.1 mg total) by mouth 2 (two) times daily.   hydroxychloroquine  (PLAQUENIL ) 200 MG tablet Take 2 tablets (400 mg total) by mouth daily.   levothyroxine  (SYNTHROID ) 125 MCG tablet Take 1 tablet (125 mcg total) by mouth every morning on an empty stomach   olmesartan  (BENICAR ) 40 MG tablet Take 1 tablet (40 mg total) by mouth daily.   omeprazole  (PRILOSEC) 40 MG capsule Take one pill once daily 30 minutes prior to last meal of the day.   predniSONE  (DELTASONE ) 5 MG tablet Take 2 tablets (10 mg total) by mouth every  morning AND 1 tablet (5 mg total) every evening.   sildenafil  (VIAGRA ) 100 MG tablet Take 1 tablet (100 mg total) by mouth daily as needed.   SYRINGE-NEEDLE, DISP, 3 ML (B-D 3CC LUER-LOK SYR 23GX1-1/2) 23G X 1-1/2" 3 ML MISC Use every 2 weeks as directed   testosterone  cypionate (DEPOTESTOSTERONE CYPIONATE) 200 MG/ML injection Inject 0.5 mLs (100 mg total) into the muscle every 14 (fourteen) days.   rosuvastatin  (CRESTOR ) 10 MG tablet Take 1 tablet (10 mg total) by mouth daily. (Patient not taking: Reported on 12/22/2023)   Vitamin D , Ergocalciferol , (DRISDOL ) 1.25 MG (50000 UNIT) CAPS capsule 1 capsule Orally Once a week 30 day(s) (Patient not taking: Reported on 12/22/2023)   [DISCONTINUED] Oxycodone  HCl 10 MG TABS Take 1 tablet (10 mg total) by mouth every 6 (six) hours as needed. Must last 30 days.   No facility-administered encounter medications on file as of 12/22/2023.    Allergies (verified) Borax and Testosterone    History: Past Medical History:  Diagnosis Date   Acute lumbar radiculopathy (Right) (S1) 05/26/2016   Acute myofascial pain 05/04/2017   Acute postoperative pain 06/22/2019   Adrenal insufficiency (HCC)    Anxiety    Anxiety disorder 01/28/2015   Arthritis    Back pain, chronic 06/03/2015  Hypertension    Hypothyroid    Sleep apnea    Thyroid  disease    Past Surgical History:  Procedure Laterality Date   BACK SURGERY     COLONOSCOPY WITH PROPOFOL  N/A 02/23/2019   Procedure: COLONOSCOPY WITH PROPOFOL ;  Surgeon: Irby Mannan, MD;  Location: [facility] ENDOSCOPY;  Service: Endoscopy;  Laterality: N/A;   discectomy N/A 2017, 2018, 2019   x3  L5-S1   ELBOW SURGERY     right elbow   ESOPHAGOGASTRODUODENOSCOPY (EGD) WITH PROPOFOL  N/A 08/15/2020   Procedure: ESOPHAGOGASTRODUODENOSCOPY (EGD) WITH PROPOFOL ;  Surgeon: Marnee Sink, MD;  Location: [facility] ENDOSCOPY;  Service: Endoscopy;  Laterality: N/A;   Family History  Problem Relation Age of Onset   Osteoarthritis Mother     Heart attack Father 57   Hypertension Father    Hypertension Sister    Social History   Socioeconomic History   Marital status: Married    Spouse name: Stephaine   Number of children: 1   Years of education: Not on file   Highest education level: Not on file  Occupational History   Not on file  Tobacco Use   Smoking status: Former    Current packs/day: 0.00    Types: Cigarettes    Quit date: 11/30/1995    Years since quitting: 28.0   Smokeless tobacco: Former    Quit date: 11/30/1995  Vaping Use   Vaping status: Never Used  Substance and Sexual Activity   Alcohol use: Yes    Alcohol/week: 0.0 standard drinks of alcohol    Comment: rarely   Drug use: No   Sexual activity: Yes    Partners: Female  Other Topics Concern   Not on file  Social History Narrative   Not on file   Social Drivers of Health   Financial Resource Strain: Low Risk  (12/22/2023)   Overall Financial Resource Strain (CARDIA)    Difficulty of Paying Living Expenses: Not hard at all  Food Insecurity: No Food Insecurity (12/22/2023)   Hunger Vital Sign    Worried About Running Out of Food in the Last Year: Never true    Ran Out of Food in the Last Year: Never true  Transportation Needs: No Transportation Needs (12/22/2023)   PRAPARE - Administrator, Civil Service (Medical): No    Lack of Transportation (Non-Medical): No  Physical Activity: Inactive (12/22/2023)   Exercise Vital Sign    Days of Exercise per Week: 0 days    Minutes of Exercise per Session: 0 min  Stress: No Stress Concern Present (12/22/2023)   [employer] of Occupational Health - Occupational Stress Questionnaire    Feeling of Stress : Only a little  Social Connections: Socially Integrated (12/22/2023)   Social Connection and Isolation Panel [NHANES]    Frequency of Communication with Friends and Family: More than three times a week    Frequency of Social Gatherings with Friends and Family: More than three times a week     Attends Religious Services: More than 4 times per year    Active Member of [employer] or Organizations: Yes    Attends Engineer, structural: More than 4 times per year    Marital Status: Married    Tobacco Counseling Counseling given: Not Answered    Clinical Intake:  Pre-visit preparation completed: Yes  Pain : 0-10 Pain Score: 5  Pain Type: Chronic pain Pain Location: Back Pain Orientation: Lower Pain Descriptors / Indicators: Aching Pain Onset: More than a month  ago Pain Frequency: Constant     BMI - recorded: 40.7 Nutritional Status: BMI > 30  Obese Nutritional Risks: Nausea/ vomitting/ diarrhea (diarrhea for about 2 years comes and goes based on diet) Diabetes: No  Lab Results  Component Value Date   HGBA1C 5.2 11/15/2019   HGBA1C 5.5 09/26/2018     How often do you need to have someone help you when you read instructions, pamphlets, or other written materials from your doctor or pharmacy?: 1 - Never  Interpreter Needed?: No  Information entered by :: R. Seona Clemenson LPN   Activities of Daily Living     12/22/2023    8:20 AM  In your present state of health, do you have any difficulty performing the following activities:  Hearing? 0  Vision? 0  Comment readers  Difficulty concentrating or making decisions? 0  Walking or climbing stairs? 0  Dressing or bathing? 0  Doing errands, shopping? 0  Preparing Food and eating ? N  Using the Toilet? N  In the past six months, have you accidently leaked urine? N  Do you have problems with loss of bowel control? N  Managing your Medications? N  Managing your Finances? N  Housekeeping or managing your Housekeeping? N    Patient Care Team: Bluford Burkitt, NP as PCP - General (Nurse Practitioner) Nahser, Lela Purple, MD as PCP - Cardiology (Cardiology)  Indicate any recent Medical Services you may have received from other than [facility] providers in the past year (date may be approximate).     Assessment:   This is a  routine wellness examination for Dean Ford.  Hearing/Vision screen Hearing Screening - Comments:: No issues Vision Screening - Comments:: readers   Goals Addressed             This Visit's Progress    Patient Stated       Wants to lose 100 pounds       Depression Screen     12/22/2023    8:31 AM 11/24/2023    3:05 PM 07/26/2020    9:56 AM 05/09/2020   11:51 AM 11/15/2019   11:50 AM 06/22/2019    9:15 AM 05/23/2019   10:51 AM  PHQ 2/9 Scores  PHQ - 2 Score 0 0 0 0 0 0 0  PHQ- 9 Score 6 6  0 0      Fall Risk     12/22/2023    8:21 AM 11/24/2023    3:05 PM 07/26/2020    9:56 AM 05/22/2020   11:57 AM 05/09/2020   11:51 AM  Fall Risk   Falls in the past year? 0 0 0 0 0  Number falls in past yr: 0 0 0  0  Injury with Fall? 0 0 0  0  Risk for fall due to : No Fall Risks No Fall Risks     Follow up Falls prevention discussed;Falls evaluation completed Falls evaluation completed       MEDICARE RISK AT HOME:  Medicare Risk at Home Any stairs in or around the home?: Yes If so, are there any without handrails?: No Home free of loose throw rugs in walkways, pet beds, electrical cords, etc?: No (discuss getting grippers for the rugs) Adequate lighting in your home to reduce risk of falls?: Yes Life alert?: No Use of a cane, walker or w/c?: No Grab bars in the bathroom?: No Shower chair or bench in shower?: Yes Elevated toilet seat or a handicapped toilet?: No  TIMED UP AND  GO:  Was the test performed?  Yes  Length of time to ambulate 10 feet: 8 sec Gait steady and fast without use of assistive device  Cognitive Function: 6CIT completed        12/22/2023    8:36 AM  6CIT Screen  What Year? 0 points  What month? 0 points  What time? 0 points  Count back from 20 0 points  Months in reverse 0 points  Repeat phrase 0 points  Total Score 0 points    Immunizations Immunization History  Administered Date(s) Administered   Tdap 05/13/2017    Screening Tests Health  Maintenance  Topic Date Due   COVID-19 Vaccine (1) Never done   INFLUENZA VACCINE  03/17/2024   Medicare Annual Wellness (AWV)  12/21/2024   DTaP/Tdap/Td (2 - Td or Tdap) 05/14/2027   Colonoscopy  02/22/2029   Hepatitis C Screening  Completed   HIV Screening  Completed   HPV VACCINES  Aged Out   Meningococcal B Vaccine  Aged Out    Health Maintenance  Health Maintenance Due  Topic Date Due   COVID-19 Vaccine (1) Never done   Health Maintenance Items Addressed: Declines covid vaccine  Additional Screening:  Vision Screening: Recommended annual ophthalmology exams for early detection of glaucoma and other disorders of the eye. Up to date [facility]  Dental Screening: Recommended annual dental exams for proper oral hygiene  Community Resource Referral / Chronic Care Management: CRR required this visit?  No   CCM required this visit?  No     Plan:     I have personally reviewed and noted the following in the patient's chart:   Medical and social history Use of alcohol, tobacco or illicit drugs  Current medications and supplements including opioid prescriptions. Patient is not currently taking opioid prescriptions. Functional ability and status Nutritional status Physical activity Advanced directives List of other physicians Hospitalizations, surgeries, and ER visits in previous 12 months Vitals Screenings to include cognitive, depression, and falls Referrals and appointments  In addition, I have reviewed and discussed with patient certain preventive protocols, quality metrics, and best practice recommendations. A written personalized care plan for preventive services as well as general preventive health recommendations were provided to patient.     Felicitas Horse, LPN   02/15/5365   After Visit Summary: (MyChart) Due to this being a telephonic visit, the after visit summary with patients personalized plan was offered to patient via MyChart   Notes: Please refer  to Routing Comments.

## 2023-12-22 NOTE — Patient Instructions (Signed)
 [name] , Thank you for taking time to come for your Medicare Wellness Visit. I appreciate your ongoing commitment to your health goals. Please review the following plan we discussed and let me know if I can assist you in the future.   Referrals/Orders/Follow-Ups/Clinician Recommendations: Consider updating your covid vaccine.  This is a list of the screening recommended for you and due dates:  Health Maintenance  Topic Date Due   COVID-19 Vaccine (1) Never done   Flu Shot  03/17/2024   Medicare Annual Wellness Visit  12/21/2024   DTaP/Tdap/Td vaccine (2 - Td or Tdap) 05/14/2027   Colon Cancer Screening  02/22/2029   Hepatitis C Screening  Completed   HIV Screening  Completed   HPV Vaccine  Aged Out   Meningitis B Vaccine  Aged Out    Advanced directives: (Declined) Advance directive discussed with you today. Even though you declined this today, please call our office should you change your mind, and we can give you the proper paperwork for you to fill out.  Next Medicare Annual Wellness Visit scheduled for next year: Yes 12/26/24 @ 8:50  Have you seen your provider in the last 6 months (3 months if uncontrolled diabetes)? Yes 11/24/23

## 2023-12-22 NOTE — Telephone Encounter (Signed)
 E2C2 - when patient calls back, please schedule a follow-up appointment for him with Bluford Burkitt, NP.

## 2023-12-27 ENCOUNTER — Encounter: Payer: Self-pay | Admitting: Cardiovascular Disease

## 2023-12-27 NOTE — Progress Notes (Unsigned)
 Cardiology Office Note   Date:  12/28/2023   ID:  Dean Ford, DOB 15-Feb-1976, MRN [id number]  PCP:  Bluford Burkitt, NP  Cardiologist:   Ahmad Alert, MD   Chief Complaint  Patient presents with   Hypertension        Hyperlipidemia   1. Hypertension 2. Chest pain - had a negative stress echo in Jan. 2016 3.  Hypothyroidism 4.  Obstructive sleep apnea 5.  Adrenal insufficiency   [name] is a 48 who is referred by Dr. Remonia Carmin for evaluation of his CP.  He has a hx of HTN and also has hypoadrenalism, hypothyroidism, low testosterone .  CP is just left of center.  Pain seems to be related to emotional stress.  Starts with dyspnea.  He would typically get shortness of breath, head ache and then get chest pain. Would take another losartan  and all the symptoms would improve after an hour. Associated with exertion - walking .  Seems to be worse in the afternoon  He snores at night. Wakes up gasping for air frequently. Has been referred for sleep study.  He did not make the appt.  Does not get any regular exercise.    Has low energy due to his endocrine issues. Has had a head MRI. pituitary gland was ok. Has had cholesterol checked in the past - was borderline years ago.   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    Works also as a Naval architect.  Was not able to complete his last training session because of shortness of breath.   September 20, 2014:  Dean Ford is a 48 y.o. male who presents for follow-up for his chest pain. He had an echocardiogram that revealed normal left ventricle systolic function. He had a stress echo ( results are not found at this time) He has not been on his BP med.   BP has been higher .    No further CP,  Not exercising at all.   December 13, 2014:  Jaffer had some chest pain - mid sternal, radiated through his back Worsened with deep breath / deep  exhale Went to the [facility] ER  Work up was negative  Has felt fine since that time.   Has been working out vigorously without any chest   December 05, 2015 Dean Ford is seen for follow up .   Has had elevated BP . Has been painting his bathroom, gets sweaty, red faced. Has some chest tightness.  Has symptoms of sleep apnea. Snores, wakes up snoring,  Not getting any other exercise  Trying to stay away from fast foods.  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  Has gained 9 lbs in the past 6 months    Oct. 17, 2017: Dean Ford is seen back today  He was diagnosed with adrenal insufficiency approximate one year ago. He's been on Cortef  and Florinef  since that time. Recently he's been having problems with high blood pressures. He tries to watch his salt intake. 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  Feels dehydrated all of the time.   Drinks lots of fluids all day long and then has to urinate all night long .   Jan. 12,  2018:   Dean Ford is seen today for follow-up visit. Has gained weight - 10 lbs since Oct. 2017. Had microdisc surgery 8 weeks ago. He had a ruptured disc between L5 and S1. He's back to work. He's not necessarily exercising much.  He takes the Florinef  every 2-3 days .  Has cut out lots of his salty snacks  Has a poor appetite recently .   Has OSA,  Is going to get CPAP within the month   July 24, 2017:   doing ok Restarting back in his exercise  Working as a Journalist, newspaper for [employer] Has an ammonia smell to his sweat .   July 25, 2018: Dean Ford is doing fairly well.  He has a history of hypertension and obstructive sleep apnea.  He also has a history of adrenal insufficiency.  He is on Cortef  and Florinef .  Had another back surgery .  No recent CP  BP looks good ,  Does not check at home Has to eat extra salt due to his adrenal  insufficiency Especially during the summer   Dec. 9, 2020  Dean Ford is seen for follow up for his hypertension.  He has a history of adrenal insufficiency.  Has questions about his [place]. And HTN associated with that  He has been taking double dose Florinef  and his BP inceased.  Is exercising some .   Is playing some disc golf.   Unable to walk continuously .    Wt is 306 lbs.       Wt Readings from Last 3 Encounters:  12/28/23 (!) 317 lb 3.2 oz (143.9 kg)  12/22/23 (!) 317 lb (143.8 kg)  11/26/23 (!) 319 lb (144.7 kg)    July 21, 2021: Dean Ford is seen today for follow up visit  Wt today is 307 lbs.  Has some muscle weakness.  Dos not get enough exercise    Jan 14, 2023 Dean Ford is seen for follow up of his HTN, adrenal insufficiency  Wt is 307.   No CP or dyspnea   Check liipds , BMP today   Dec 28, 2023 Dean Ford is seen for follow up of his HTN, adrenal insufficiency  Wt is 317 lbs ( up 10 lbs )   No cp, no dyspnea     Past Medical History:  Diagnosis Date   Acute lumbar radiculopathy (Right) (S1) 05/26/2016   Acute myofascial pain 05/04/2017   Acute postoperative pain 06/22/2019   Adrenal insufficiency (HCC)    Anxiety    Anxiety disorder 01/28/2015   Arthritis    Back pain, chronic 06/03/2015   Hypertension    Hypothyroid    Sleep apnea    Thyroid  disease     Past Surgical History:  Procedure Laterality Date   BACK SURGERY     COLONOSCOPY WITH PROPOFOL  N/A 02/23/2019   Procedure: COLONOSCOPY WITH PROPOFOL ;  Surgeon: Irby Mannan, MD;  Location: [facility] ENDOSCOPY;  Service: Endoscopy;  Laterality: N/A;   discectomy N/A 2017, 2018, 2019   x3  L5-S1   ELBOW SURGERY     right elbow   ESOPHAGOGASTRODUODENOSCOPY (EGD) WITH PROPOFOL  N/A 08/15/2020   Procedure: ESOPHAGOGASTRODUODENOSCOPY (EGD) WITH PROPOFOL ;  Surgeon: Marnee Sink, MD;  Location: [facility] ENDOSCOPY;  Service: Endoscopy;  Laterality: N/A;     Current Outpatient Medications  Medication Sig  Dispense Refill   carvedilol  (COREG ) 6.25 MG tablet Take 1 tablet (6.25 mg total) by mouth 2 (two) times daily. 180 tablet 3   fludrocortisone  (FLORINEF ) 0.1  MG tablet Take 1 tablet (0.1 mg total) by mouth 2 (two) times daily. 60 tablet 12   hydroxychloroquine  (PLAQUENIL ) 200 MG tablet Take 2 tablets (400 mg total) by mouth daily. 180 tablet 0   levothyroxine  (SYNTHROID ) 125 MCG tablet Take 1 tablet (125 mcg total) by mouth every morning on an empty stomach 30 tablet 6   olmesartan  (BENICAR ) 40 MG tablet Take 1 tablet (40 mg total) by mouth daily. 30 tablet 11   omeprazole  (PRILOSEC) 40 MG capsule Take one pill once daily 30 minutes prior to last meal of the day. 90 capsule 3   predniSONE  (DELTASONE ) 5 MG tablet Take 2 tablets (10 mg total) by mouth every morning AND 1 tablet (5 mg total) every evening. 360 tablet 4   sildenafil  (VIAGRA ) 100 MG tablet Take 1 tablet (100 mg total) by mouth daily as needed. 60 tablet 4   SYRINGE-NEEDLE, DISP, 3 ML (B-D 3CC LUER-LOK SYR 23GX1-1/2) 23G X 1-1/2" 3 ML MISC Use every 2 weeks as directed 25 each 5   testosterone  cypionate (DEPOTESTOSTERONE CYPIONATE) 200 MG/ML injection Inject 0.5 mLs (100 mg total) into the muscle every 14 (fourteen) days. 1 mL 5   Vitamin D , Ergocalciferol , (DRISDOL ) 1.25 MG (50000 UNIT) CAPS capsule 1 capsule Orally Once a week 30 day(s) 4 capsule 4   No current facility-administered medications for this visit.    Allergies:   Borax and Testosterone     Social History:  The patient  reports that he quit smoking about 28 years ago. His smoking use included cigarettes. He quit smokeless tobacco use about 28 years ago. He reports current alcohol use. He reports that he does not use drugs.   Family History:  The patient's family history includes Heart attack (age of onset: 105) in his father; Hypertension in his father and sister; Osteoarthritis in his mother.    ROS: As noted in the current history.  All other systems are  negative.    Physical Exam: Blood pressure 138/86, pulse 64, height 6\' 2"  (1.88 m), weight (!) 317 lb 3.2 oz (143.9 kg), SpO2 94%.       GEN: Morbidly obese, middle-aged gentleman, no acute distress  HEENT: Normal NECK: No JVD; No carotid bruits LYMPHATICS: No lymphadenopathy CARDIAC: RRR , no murmurs, rubs, gallops RESPIRATORY:  Clear to auscultation without rales, wheezing or rhonchi  ABDOMEN: Soft, non-tender, non-distended MUSCULOSKELETAL:  No edema; No deformity  SKIN: Warm and dry NEUROLOGIC:  Alert and oriented x 3    EKG:      EKG Interpretation Date/Time:  Tuesday Dec 28 2023 09:37:28 EDT Ventricular Rate:  64 PR Interval:  148 QRS Duration:  94 QT Interval:  392 QTC Calculation: 404 R Axis:   33  Text Interpretation: Normal sinus rhythm Nonspecific T wave abnormality When compared with ECG of 15-Mar-2018 13:14, No significant change was found Confirmed by Ahmad Alert ([id number]) on 12/28/2023 9:45:36 AM      Recent Labs: 01/14/2023: ALT 43; BUN 13; Creatinine, Ser 1.13; Potassium 4.2; Sodium 142    Lipid Panel    Component Value Date/Time   CHOL 179 01/14/2023 1556   TRIG 118 01/14/2023 1556   HDL 47 01/14/2023 1556   CHOLHDL 3.8 01/14/2023 1556   CHOLHDL 6.6 (H) 11/15/2019 1236   VLDL 58 (H) 03/12/2016 0755   LDLCALC 111 (H) 01/14/2023 1556   LDLCALC 151 (H) 11/15/2019 1236      Wt Readings from Last 3 Encounters:  12/28/23 (!) 317 lb 3.2 oz (  143.9 kg)  12/22/23 (!) 317 lb (143.8 kg)  11/26/23 (!) 319 lb (144.7 kg)      Other studies Reviewed: Additional studies/ records that were reviewed today include:  Stress echo and resting echo . Review of the above records demonstrates: normal LV function    ASSESSMENT AND PLAN:  1. Hypertension -    BP is well controlled.  Continue current medications  2.  Morbid obesity :    We discussed carbohydrate reduction and calorie reduction.  He admits that he felt quite a bit better when he cut out the  sugar out of his diet about 6 months ago.  Unfortunately he has regained 10 to 15 pounds.  3.  Hypothyroidism -     4. ? Obstructive sleep apnea -       5. Adrenal insufficiency:.       6.. hyperlipidemia :   his last LDL is 111.  Continue current medications.  Discussed improving his diet and exercise program.   Current medicines are reviewed at length with the patient today.  The patient does not have concerns regarding medicines.  The following changes have been made: No changes.        Signed, Ahmad Alert, MD  12/28/2023 9:46 AM    [facility] [address] [place], [place], [place]  [address] Phone: [phone]; Fax: [phone]

## 2023-12-28 ENCOUNTER — Ambulatory Visit: Attending: Cardiovascular Disease | Admitting: Cardiovascular Disease

## 2023-12-28 ENCOUNTER — Encounter: Payer: Self-pay | Admitting: Cardiovascular Disease

## 2023-12-28 VITALS — BP 138/86 | HR 64 | Ht 74.0 in | Wt 317.2 lb

## 2023-12-28 DIAGNOSIS — I10 Essential (primary) hypertension: Secondary | ICD-10-CM | POA: Insufficient documentation

## 2023-12-28 NOTE — Patient Instructions (Signed)
 Follow-Up: At [facility], you and your health needs are our priority.  As part of our continuing mission to provide you with exceptional heart care, our providers are all part of one team.  This team includes your primary Cardiologist (physician) and Advanced Practice Providers or APPs (Physician Assistants and Nurse Practitioners) who all work together to provide you with the care you need, when you need it.  Your next appointment:   1 year(s)  Provider:   Ahmad Alert, MD

## 2023-12-31 ENCOUNTER — Ambulatory Visit: Payer: Medicare Other | Admitting: Cardiovascular Disease

## 2023-12-31 ENCOUNTER — Other Ambulatory Visit: Payer: Self-pay

## 2024-01-03 ENCOUNTER — Other Ambulatory Visit: Payer: Self-pay

## 2024-01-04 ENCOUNTER — Other Ambulatory Visit: Payer: Self-pay

## 2024-01-06 ENCOUNTER — Other Ambulatory Visit: Payer: Self-pay

## 2024-01-17 ENCOUNTER — Other Ambulatory Visit: Payer: Self-pay

## 2024-01-17 ENCOUNTER — Other Ambulatory Visit: Payer: Self-pay | Admitting: Cardiovascular Disease

## 2024-01-17 MED ORDER — CARVEDILOL 6.25 MG PO TABS
6.2500 mg | ORAL_TABLET | Freq: Two times a day (BID) | ORAL | 3 refills | Status: AC
  Filled 2024-01-17: qty 180, 90d supply, fill #0
  Filled 2024-04-12: qty 180, 90d supply, fill #1
  Filled 2024-07-07: qty 180, 90d supply, fill #2

## 2024-01-18 ENCOUNTER — Other Ambulatory Visit: Payer: Self-pay

## 2024-01-19 ENCOUNTER — Other Ambulatory Visit: Payer: Self-pay

## 2024-01-19 MED ORDER — LEVOTHYROXINE SODIUM 125 MCG PO TABS
125.0000 ug | ORAL_TABLET | Freq: Every morning | ORAL | 6 refills | Status: AC
  Filled 2024-01-19 – 2024-03-13 (×2): qty 30, 30d supply, fill #0
  Filled 2024-07-31: qty 30, 30d supply, fill #1
  Filled 2024-08-29: qty 30, 30d supply, fill #2

## 2024-01-26 ENCOUNTER — Other Ambulatory Visit: Payer: Self-pay

## 2024-02-14 ENCOUNTER — Other Ambulatory Visit: Payer: Self-pay

## 2024-02-14 ENCOUNTER — Other Ambulatory Visit: Payer: Self-pay | Admitting: Cardiovascular Disease

## 2024-02-14 MED ORDER — OLMESARTAN MEDOXOMIL 40 MG PO TABS
40.0000 mg | ORAL_TABLET | Freq: Every day | ORAL | 11 refills | Status: AC
  Filled 2024-02-14: qty 30, 30d supply, fill #0
  Filled 2024-03-23: qty 30, 30d supply, fill #1
  Filled 2024-04-26: qty 30, 30d supply, fill #2
  Filled 2024-05-26: qty 30, 30d supply, fill #3
  Filled 2024-06-29: qty 30, 30d supply, fill #4
  Filled 2024-07-31: qty 30, 30d supply, fill #5
  Filled 2024-08-29: qty 30, 30d supply, fill #6

## 2024-02-21 ENCOUNTER — Other Ambulatory Visit: Payer: Self-pay

## 2024-02-25 ENCOUNTER — Ambulatory Visit: Admitting: Sleep Medicine

## 2024-03-13 ENCOUNTER — Other Ambulatory Visit: Payer: Self-pay

## 2024-03-13 MED ORDER — PREDNISONE 5 MG PO TABS
ORAL_TABLET | ORAL | 4 refills | Status: DC
  Filled 2024-03-13: qty 270, 90d supply, fill #0
  Filled 2024-05-11 – 2024-07-31 (×3): qty 270, 90d supply, fill #1

## 2024-04-12 ENCOUNTER — Other Ambulatory Visit: Payer: Self-pay

## 2024-04-13 ENCOUNTER — Other Ambulatory Visit: Payer: Self-pay

## 2024-04-13 MED ORDER — TESTOSTERONE CYPIONATE 200 MG/ML IM SOLN
100.0000 mg | INTRAMUSCULAR | 5 refills | Status: AC
  Filled 2024-04-13: qty 2, 28d supply, fill #0
  Filled 2024-06-16: qty 2, 28d supply, fill #1
  Filled 2024-07-31: qty 2, 28d supply, fill #2

## 2024-04-17 ENCOUNTER — Ambulatory Visit

## 2024-05-11 ENCOUNTER — Other Ambulatory Visit: Payer: Self-pay

## 2024-05-15 ENCOUNTER — Other Ambulatory Visit: Payer: Self-pay

## 2024-05-25 ENCOUNTER — Other Ambulatory Visit: Payer: Self-pay

## 2024-05-25 ENCOUNTER — Ambulatory Visit: Admitting: Nurse Practitioner

## 2024-05-25 MED ORDER — PREDNISONE 5 MG PO TABS
15.0000 mg | ORAL_TABLET | ORAL | 5 refills | Status: AC
  Filled 2024-05-25: qty 360, 60d supply, fill #0

## 2024-06-16 ENCOUNTER — Other Ambulatory Visit: Payer: Self-pay

## 2024-06-28 ENCOUNTER — Ambulatory Visit (INDEPENDENT_AMBULATORY_CARE_PROVIDER_SITE_OTHER)

## 2024-06-28 VITALS — BP 159/99 | HR 51 | Resp 16 | Ht 74.0 in | Wt 303.0 lb

## 2024-06-28 DIAGNOSIS — R41840 Attention and concentration deficit: Secondary | ICD-10-CM | POA: Diagnosis not present

## 2024-06-28 DIAGNOSIS — M5416 Radiculopathy, lumbar region: Secondary | ICD-10-CM

## 2024-06-28 DIAGNOSIS — G4733 Obstructive sleep apnea (adult) (pediatric): Secondary | ICD-10-CM | POA: Diagnosis not present

## 2024-06-28 DIAGNOSIS — I10 Essential (primary) hypertension: Secondary | ICD-10-CM | POA: Diagnosis not present

## 2024-06-28 DIAGNOSIS — E039 Hypothyroidism, unspecified: Secondary | ICD-10-CM

## 2024-06-28 DIAGNOSIS — M542 Cervicalgia: Secondary | ICD-10-CM

## 2024-06-28 NOTE — Progress Notes (Addendum)
 "   New patient visit   Patient: Dean Ford   DOB: Oct 06, 1975   48 y.o. Male  MRN: [id number] Visit Date: 06/28/2024  Today's healthcare provider: Isaiah DELENA Pepper, MD   Chief Complaint  Patient presents with   Transitions Of Care    TOC/Back (MRI)/Referrals -CPAP,/ ADD/ADHD /   Subjective    Dean Ford is a 48 y.o. male who presents today as a new patient to establish care.   Discussed the use of AI scribe software for clinical note transcription with the patient, who gave verbal consent to proceed.  History of Present Illness Dean Ford is a 48 year old male with lumbar spine issues, inattention, and sleep issues.  He has a history of lumbar spine issues, including disc herniation and degeneration, previously evaluated by neurosurgery. Over the past several years, he has experienced numbness in the groin area, exacerbated by sitting, affecting his ability to feel during intimacy. He also reports urinary difficulties, such as not realizing when his bladder is full and having accidents when changing positions. These symptoms have been gradually worsening.  He has a history of a significant back injury from a fall, initially causing pain between his shoulder blades and lower back. He reports pain between his shoulder blades radiating down his right arm, aggravated by coughing.  He has previously undergone a discectomy and has not had an MRI since that procedure. He has stopped taking pain medications due to gastroparesis and sensitivity. Muscle relaxers have not been effective for him.  He uses a CPAP machine for sleep apnea, but the machine is outdated, and he has been unable to complete a home sleep study due to the requirement to be off the CPAP, which he cannot do.   He reports anxiety related to medical visits and has a history of ADHD symptoms, particularly difficulty initiating tasks, which he has not been formally diagnosed with.  He has a history of rheumatoid  arthritis, which he manages by avoiding sugar, particularly high fructose corn syrup, and has been able to stop taking hydroxychloroquine . He also reports issues with electrolyte deficiency, which he manages with Pedialyte packets.  His current medications include fludrocortisone , Synthroid , olmesartan , carvedilol , testosterone , Viagra , and prednisone . He adjusts his prednisone  dose based on his condition, such as increasing it for procedures or injuries.   BP Readings from Last 3 Encounters:  06/28/24 (!) 159/99  12/28/23 138/86  12/22/23 (!) 144/88     Past Medical History:  Diagnosis Date   Acute lumbar radiculopathy (Right) (S1) 05/26/2016   Acute myofascial pain 05/04/2017   Acute postoperative pain 06/22/2019   Adrenal insufficiency    Anxiety    Anxiety disorder 01/28/2015   Arthritis    Back pain, chronic 06/03/2015   Hypertension    Hypothyroid    Sleep apnea    Thyroid  disease    Past Surgical History:  Procedure Laterality Date   BACK SURGERY     COLONOSCOPY WITH PROPOFOL  N/A 02/23/2019   Procedure: COLONOSCOPY WITH PROPOFOL ;  Surgeon: Janalyn Keene NOVAK, MD;  Location: [facility] ENDOSCOPY;  Service: Endoscopy;  Laterality: N/A;   discectomy N/A 2017, 2018, 2019   x3  L5-S1   ELBOW SURGERY     right elbow   ESOPHAGOGASTRODUODENOSCOPY (EGD) WITH PROPOFOL  N/A 08/15/2020   Procedure: ESOPHAGOGASTRODUODENOSCOPY (EGD) WITH PROPOFOL ;  Surgeon: Jinny Carmine, MD;  Location: [facility] ENDOSCOPY;  Service: Endoscopy;  Laterality: N/A;   Family Status  Relation Name Status   Mother  Alive   Father  Deceased   Sister  Alive  No partnership data on file   Family History  Problem Relation Age of Onset   Osteoarthritis Mother    Heart attack Father 32   Hypertension Father    Hypertension Sister    Social History   Socioeconomic History   Marital status: Married    Spouse name: Stephaine   Number of children: 1   Years of education: Not on file   Highest education level: Not  on file  Occupational History   Not on file  Tobacco Use   Smoking status: Former    Current packs/day: 0.00    Types: Cigarettes    Quit date: 11/30/1995    Years since quitting: 28.5   Smokeless tobacco: Former    Quit date: 11/30/1995  Vaping Use   Vaping status: Never Used  Substance and Sexual Activity   Alcohol use: Yes    Alcohol/week: 0.0 standard drinks of alcohol    Comment: rarely   Drug use: No   Sexual activity: Yes    Partners: Female  Other Topics Concern   Not on file  Social History Narrative   Not on file   Social Drivers of Health   Financial Resource Strain: Low Risk  (12/22/2023)   Overall Financial Resource Strain (CARDIA)    Difficulty of Paying Living Expenses: Not hard at all  Food Insecurity: No Food Insecurity (12/22/2023)   Hunger Vital Sign    Worried About Running Out of Food in the Last Year: Never true    Ran Out of Food in the Last Year: Never true  Transportation Needs: No Transportation Needs (12/22/2023)   PRAPARE - Administrator, Civil Service (Medical): No    Lack of Transportation (Non-Medical): No  Physical Activity: Inactive (12/22/2023)   Exercise Vital Sign    Days of Exercise per Week: 0 days    Minutes of Exercise per Session: 0 min  Stress: No Stress Concern Present (12/22/2023)   [employer] of Occupational Health - Occupational Stress Questionnaire    Feeling of Stress : Only a little  Social Connections: Socially Integrated (12/22/2023)   Social Connection and Isolation Panel    Frequency of Communication with Friends and Family: More than three times a week    Frequency of Social Gatherings with Friends and Family: More than three times a week    Attends Religious Services: More than 4 times per year    Active Member of [employer] or Organizations: Yes    Attends Engineer, Structural: More than 4 times per year    Marital Status: Married   Outpatient Medications Prior to Visit  Medication Sig    carvedilol  (COREG ) 6.25 MG tablet Take 1 tablet (6.25 mg total) by mouth 2 (two) times daily.   levothyroxine  (SYNTHROID ) 125 MCG tablet Take 1 tablet (125 mcg total) by mouth every morning on an empty stomach   olmesartan  (BENICAR ) 40 MG tablet Take 1 tablet (40 mg total) by mouth daily.   omeprazole  (PRILOSEC) 40 MG capsule Take one pill once daily 30 minutes prior to last meal of the day.   predniSONE  (DELTASONE ) 5 MG tablet Take 2 tablets (10 mg total) by mouth in the morning AND 1 tablet (5 mg total) every evening.   predniSONE  (DELTASONE ) 5 MG tablet Take 3-6 tablets (15-30 mg total) by mouth daily as directed.   sildenafil  (VIAGRA ) 100 MG tablet Take 1 tablet (100 mg total)  by mouth daily as needed.   SYRINGE-NEEDLE, DISP, 3 ML (B-D 3CC LUER-LOK SYR 23GX1-1/2) 23G X 1-1/2 3 ML MISC Use every 2 weeks as directed   testosterone  cypionate (DEPOTESTOSTERONE CYPIONATE) 200 MG/ML injection Inject 0.5 mLs (100 mg total) into the muscle every 14 (fourteen) days.   [DISCONTINUED] levothyroxine  (SYNTHROID ) 125 MCG tablet Take 1 tablet (125 mcg total) by mouth every morning on an empty stomach   [DISCONTINUED] Vitamin D , Ergocalciferol , (DRISDOL ) 1.25 MG (50000 UNIT) CAPS capsule 1 capsule Orally Once a week 30 day(s)   fludrocortisone  (FLORINEF ) 0.1 MG tablet Take 1 tablet (0.1 mg total) by mouth 2 (two) times daily. (Patient not taking: Reported on 06/28/2024)   [DISCONTINUED] hydroxychloroquine  (PLAQUENIL ) 200 MG tablet Take 2 tablets (400 mg total) by mouth daily. (Patient not taking: Reported on 06/28/2024)   No facility-administered medications prior to visit.   Allergies  Allergen Reactions   Borax Rash    Borax detergent   Testosterone  Rash    Reviews of Systems as noted in HPI.       Objective    BP (!) 159/99 (BP Location: Left Arm, Patient Position: Sitting, Cuff Size: Large)   Pulse (!) 51   Resp 16   Ht 6' 2 (1.88 m)   Wt (!) 303 lb (137.4 kg)   SpO2 100%   BMI 38.90  kg/m      Physical Exam Constitutional:      Appearance: Normal appearance.  HENT:     Head: Normocephalic and atraumatic.     Mouth/Throat:     Mouth: Mucous membranes are moist.  Eyes:     Pupils: Pupils are equal, round, and reactive to light.  Pulmonary:     Effort: Pulmonary effort is normal.  Musculoskeletal:     Cervical back: Tenderness present. No bony tenderness.     Lumbar back: Tenderness present. No bony tenderness.     Comments: Bilateral cervical paraspinal muscle tenderness present.  Bilateral lumbar paraspinal muscle tenderness present. Scars from previous surgeries noted.  Skin:    General: Skin is warm.  Neurological:     General: No focal deficit present.     Mental Status: He is alert.     Depression Screen    12/22/2023    8:31 AM 11/24/2023    3:05 PM 07/26/2020    9:56 AM 05/09/2020   11:51 AM  PHQ 2/9 Scores  PHQ - 2 Score 0 0 0 0  PHQ- 9 Score 6  6   0      Data saved with a previous flowsheet row definition   No results found for any visits on 06/28/24.  Assessment & Plan      Problem List Items Addressed This Visit       Cardiovascular and Mediastinum   Essential hypertension     Respiratory   OSA (obstructive sleep apnea)   Relevant Orders   Ambulatory referral to Sleep Studies     Endocrine   Hypothyroidism     Nervous and Auditory   Lumbar radiculopathy - Primary   Relevant Orders   MR LUMBAR SPINE WO CONTRAST     Other   Inattention   Relevant Orders   Amb ref to Integrated Behavioral Health   Cervicalgia   Relevant Orders   MR Cervical Spine Wo Contrast   Assessment & Plan Lumbar radiculopathy with groin and lower extremity symptoms Chronic lumbar radiculopathy with worsening numbness and pain, suggestive of nerve compression. Hx of previous lumbar  spine surgeries in 2017-2019. Previous MRI in 2020 showed post-operative granulation tissue surrounding the S1 nerve root, persistent severe stenosis at L5-S1 and disc  degeneration at L4-L5. I am worried about worsening nerve compression post surgery given patient has worsening saddle anesthesia and sciatica. - Ordered MRI of lumbar spine - Consider neurosurgery referral based on MRI results.  Cervical radiculopathy with right arm pain Chronic cervical radiculopathy with worsening right arm numbness and pain, suggestive of nerve compression. - Ordered MRI of cervical spine to assess for nerve compression.  Obstructive sleep apnea, on CPAP Obstructive sleep apnea managed with CPAP. Current machine expired but functional. Requires in-lab sleep study for CPAP titration. - Referred to [facility] for in-lab sleep study for CPAP titration.  Inattention Attention and concentration deficits suggestive of ADHD. Has never had formal evaluation done previously. Previous referral to [facility] not covered by insurance. - Referred to in-house behavioral health specialist for evaluation and medication recommendations  Essential hypertension Chronic, uncontrolled based on reading in office today, but controlled based on home readings. Managed with olmesartan  and carvedilol .  - Continue olmesartan  40mg  and Coreg  6.25mg  BID - Continue checking BP at home - Follow up with cardiology  Hypothyroidism Chronic, controlled. Managed with Synthroid  125 mcg daily. - Continue current Synthroid  regimen. - Follow up with endocrinology  Patient/Guardian was advised Release of Information must be obtained prior to any record release in order to collaborate their care with an outside provider. Patient/Guardian was advised if they have not already done so to contact the registration department to sign all necessary forms in order for us  to release information regarding their care.   Consent: Patient and/or legal guardian verbally consented to [facility] services about presenting concerns and psychiatric consultation as  appropriate. The services will be billed as appropriate for the patient     Return in about 3 months (around 09/28/2024) for Follow Up.      Isaiah DELENA Pepper, MD  [facility] [phone] (phone) [phone] (fax) "

## 2024-06-28 NOTE — Addendum Note (Signed)
 Addended by: FRANCHOT ISAIAH LABOR on: 06/28/2024 05:33 PM   Modules accepted: Level of Service

## 2024-06-29 ENCOUNTER — Other Ambulatory Visit: Payer: Self-pay

## 2024-06-30 ENCOUNTER — Other Ambulatory Visit: Payer: Self-pay

## 2024-06-30 MED ORDER — FLUDROCORTISONE ACETATE 0.1 MG PO TABS
0.1000 mg | ORAL_TABLET | Freq: Two times a day (BID) | ORAL | 12 refills | Status: DC
  Filled 2024-06-30: qty 60, 30d supply, fill #0
  Filled 2024-07-31: qty 60, 30d supply, fill #1
  Filled 2024-08-29: qty 60, 30d supply, fill #2

## 2024-06-30 MED ORDER — SILDENAFIL CITRATE 100 MG PO TABS
100.0000 mg | ORAL_TABLET | Freq: Every day | ORAL | 4 refills | Status: AC | PRN
  Filled 2024-06-30: qty 4, 30d supply, fill #0
  Filled 2024-07-31: qty 4, 30d supply, fill #1
  Filled 2024-08-29: qty 4, 30d supply, fill #2

## 2024-07-05 ENCOUNTER — Ambulatory Visit
Admission: RE | Admit: 2024-07-05 | Discharge: 2024-07-05 | Disposition: A | Discharge status: Home / Self Care | Source: Ambulatory Visit

## 2024-07-05 DIAGNOSIS — M542 Cervicalgia: Secondary | ICD-10-CM | POA: Diagnosis present

## 2024-07-05 DIAGNOSIS — M5416 Radiculopathy, lumbar region: Secondary | ICD-10-CM | POA: Diagnosis present

## 2024-07-09 ENCOUNTER — Other Ambulatory Visit: Payer: Self-pay

## 2024-07-09 ENCOUNTER — Ambulatory Visit: Payer: Self-pay

## 2024-07-09 DIAGNOSIS — M5412 Radiculopathy, cervical region: Secondary | ICD-10-CM

## 2024-07-09 DIAGNOSIS — M5416 Radiculopathy, lumbar region: Secondary | ICD-10-CM

## 2024-07-19 ENCOUNTER — Ambulatory Visit

## 2024-07-26 NOTE — Progress Notes (Signed)
 Referring Physician:  Franchot Isaiah LABOR, MD [address] [place],  [place] [address]  Primary Physician:  Franchot Isaiah LABOR, MD  History of Present Illness: 07/27/2024 [name] is here today with a chief complaint of chronic neck and back pain who presents with worsening neck pain and left arm symptoms.  He has had neck problems for about ten years with worsening symptoms over the past few months.  He now has neck pain radiating into the left arm with numbness and tingling into the fourth and fifth fingers. Coughing or sneezing worsens the pain, which can travel up the arm. He notes tightness around the left shoulder blade and muscle tension. Chiropractic treatment for three weeks (without spinal manipulation) has reduced muscle knots but has not relieved the radicular symptoms.  He has had three prior back surgeries, most recently in late 2019 or early 2020 in [place], with persistent right leg numbness since that time. He also reports intermittent numbness in the groin that has improved with chiropractic care. He is on full disability for spine and other medical issues.  He has been off pain medications for three years due to gastroparesis and hypersensitivity and currently uses stretching and clinic-based therapy for neck pain. He reports occasional balance problems, which he attributes to right foot numbness.  On review of symptoms today, he notes neck tightness when turning his head to the left and intermittent balance issues related to right foot numbness. He denies current new or progressive numbness in the arms or legs.  Discussed the use of AI scribe software for clinical note transcription with the patient, who gave verbal consent to proceed.   Bowel/Bladder Dysfunction: none  Conservative measures: seeing a chiropractor, [facility] Physical therapy: has not participated in recently for his back or neck Multimodal medical therapy including  regular antiinflammatories: advil  Injections: had lumbar injections in the past but no neck injections  Past Surgery: Lumbar Surgery   [name] has no symptoms of cervical myelopathy.  The symptoms are causing a significant impact on the patient's life.   I have utilized the care everywhere function in epic to review the outside records available from external health systems.   Review of Systems:  A 10 point review of systems is negative, except for the pertinent positives and negatives detailed in the HPI.  Past Medical History: Past Medical History:  Diagnosis Date   Acute lumbar radiculopathy (Right) (S1) 05/26/2016   Acute myofascial pain 05/04/2017   Acute postoperative pain 06/22/2019   Adrenal insufficiency    Anxiety    Anxiety disorder 01/28/2015   Arthritis    Back pain, chronic 06/03/2015   Hypertension    Hypothyroid    Sleep apnea    Thyroid  disease     Past Surgical History: Past Surgical History:  Procedure Laterality Date   BACK SURGERY     COLONOSCOPY WITH PROPOFOL  N/A 02/23/2019   Procedure: COLONOSCOPY WITH PROPOFOL ;  Surgeon: Janalyn Keene NOVAK, MD;  Location: [facility] ENDOSCOPY;  Service: Endoscopy;  Laterality: N/A;   discectomy N/A 2017, 2018, 2019   x3  L5-S1   ELBOW SURGERY     right elbow   ESOPHAGOGASTRODUODENOSCOPY (EGD) WITH PROPOFOL  N/A 08/15/2020   Procedure: ESOPHAGOGASTRODUODENOSCOPY (EGD) WITH PROPOFOL ;  Surgeon: Jinny Carmine, MD;  Location: [facility] ENDOSCOPY;  Service: Endoscopy;  Laterality: N/A;    Allergies: Allergies as of 07/27/2024 - Review Complete 07/27/2024  Allergen Reaction Noted   Borax Rash 06/03/2015  Testosterone  Rash 07/05/2020    Medications:  Current Outpatient Medications:    carvedilol  (COREG ) 6.25 MG tablet, Take 1 tablet (6.25 mg total) by mouth 2 (two) times daily., Disp: 180 tablet, Rfl: 3   fludrocortisone  (FLORINEF ) 0.1 MG tablet, Take 1 tablet (0.1 mg total) by mouth 2 (two) times daily., Disp: 60  tablet, Rfl: 12   levothyroxine  (SYNTHROID ) 125 MCG tablet, Take 1 tablet (125 mcg total) by mouth every morning on an empty stomach, Disp: 30 tablet, Rfl: 6   olmesartan  (BENICAR ) 40 MG tablet, Take 1 tablet (40 mg total) by mouth daily., Disp: 30 tablet, Rfl: 11   omeprazole  (PRILOSEC) 40 MG capsule, Take one pill once daily 30 minutes prior to last meal of the day., Disp: 90 capsule, Rfl: 3   predniSONE  (DELTASONE ) 5 MG tablet, Take 2 tablets (10 mg total) by mouth in the morning AND 1 tablet (5 mg total) every evening., Disp: 360 tablet, Rfl: 4   predniSONE  (DELTASONE ) 5 MG tablet, Take 3-6 tablets (15-30 mg total) by mouth daily as directed., Disp: 360 tablet, Rfl: 5   sildenafil  (VIAGRA ) 100 MG tablet, Take 1 tablet (100 mg total) by mouth daily as needed., Disp: 60 tablet, Rfl: 4   SYRINGE-NEEDLE, DISP, 3 ML (B-D 3CC LUER-LOK SYR 23GX1-1/2) 23G X 1-1/2 3 ML MISC, Use every 2 weeks as directed, Disp: 25 each, Rfl: 5   testosterone  cypionate (DEPOTESTOSTERONE CYPIONATE) 200 MG/ML injection, Inject 0.5 mLs (100 mg total) into the muscle every 14 (fourteen) days., Disp: 2 mL, Rfl: 5  Social History: Social History   Tobacco Use   Smoking status: Former    Current packs/day: 0.00    Types: Cigarettes    Quit date: 11/30/1995    Years since quitting: 28.6   Smokeless tobacco: Former    Quit date: 11/30/1995  Vaping Use   Vaping status: Never Used  Substance Use Topics   Alcohol use: Yes    Alcohol/week: 0.0 standard drinks of alcohol    Comment: rarely   Drug use: No    Family Medical History: Family History  Problem Relation Age of Onset   Osteoarthritis Mother    Heart attack Father 64   Hypertension Father    Hypertension Sister     Physical Examination: Vitals:   07/27/24 1009  BP: (!) 150/88    General: Patient is in no apparent distress. Attention to examination is appropriate.  Neck:   Supple.  Full range of motion.  Some discomfort on rotation to the  left  Respiratory: Patient is breathing without any difficulty.   NEUROLOGICAL:     Awake, alert, oriented to person, place, and time.  Speech is clear and fluent.   Cranial Nerves: Pupils equal round and reactive to light.  Facial tone is symmetric.  Facial sensation is symmetric. Shoulder shrug is symmetric. Tongue protrusion is midline.  There is no pronator drift.  Strength: Side Biceps Triceps Deltoid Interossei Grip Wrist Ext. Wrist Flex.  R 5 5 5 5 5 5 5   L 5 5 5 5 5 5 5    Side Iliopsoas Quads Hamstring PF DF EHL  R 5 5 5 5 5 5   L 5 5 5 5 5 5    Reflexes are 1+ and symmetric at the biceps, triceps, brachioradialis, patella and achilles.   Hoffman's is absent.   Bilateral upper and lower extremity sensation is intact to light touch.    No evidence of dysmetria noted.  Medical Decision Making  Imaging: MR CL spine 07/05/2024 IMPRESSION: 1. Broad-based disc osteophyte complex at C5-6 causing moderate canal stenosis to 7 mm with ventral cord contour distortion and severe foraminal narrowing, worse on the right. 2. Moderate-to-severe right and moderate left foraminal stenosis at C4-5 due to rightward disc osteophyte complex. 3. Moderate right foraminal stenosis at C3-4 due to rightward disc osteophyte complex. 4. Moderate foraminal narrowing, worse on the right, at C6-7 due to broad-based disc osteophyte complex. 5. Mild right foraminal stenosis at C2-3 due to rightward disc osteophyte complex. 6. Slight retrolisthesis at C5-6 and C6-7.   Electronically signed by: Lonni Necessary MD 07/09/2024 12:15 PM EST RP Workstation: [id number]  L5-S1: Right laminectomy is again noted at L5-S1. Chronic type 2 Modic changes are present on the right. Progressive severe right foraminal stenosis is present. No significant disc herniation. No spinal canal stenosis.   IMPRESSION: 1. Progressive severe right foraminal stenosis at L5-S1.   Electronically signed by: Lonni Necessary MD 07/09/2024 12:26 PM EST RP Workstation: [id number]   I have personally reviewed the images and agree with the above interpretation.  Assessment and Plan: Mr. Kenley is a pleasant 48 y.o. male with chronic lumbar radiculopathy with numbness in his right leg.  He also has low back pain.  This is indicative of postlaminectomy syndrome but also may be affected by the severe right L5-S1 foraminal stenosis.  In his neck, he has some symptoms that are possibly left ulnar neuropathy versus cervical radiculopathy.  He has cervical stenosis but no overt myelopathy.  I would like to send him for an epidural steroid injection and start physical therapy.  Will also get a nerve conduction study to evaluate for peripheral neuropathy.  For his back, we discussed spinal cord stimulation.  He would like to think about this.  I will give him information.  He has previously seen Dr. Naveira, who is also well versed  in spinal cord stimulation.  I will refer him back to discuss epidural injection for his neck and the stimulator as a possible treatment for his lower back.  I will see him back in 8 to 10 weeks.  I spent a total of 30 minutes in this patient's care today. This time was spent reviewing pertinent records including imaging studies, obtaining and confirming history, performing a directed evaluation, formulating and discussing my recommendations, and documenting the visit within the medical record.    Thank you for involving me in the care of this patient.      Ekaterina Denise K. Clois MD, [facility] Neurosurgery

## 2024-07-27 ENCOUNTER — Encounter: Payer: Self-pay | Admitting: Neurosurgery

## 2024-07-27 ENCOUNTER — Ambulatory Visit: Admitting: Neurosurgery

## 2024-07-27 ENCOUNTER — Other Ambulatory Visit: Payer: Self-pay

## 2024-07-27 ENCOUNTER — Encounter: Payer: Self-pay | Admitting: Neurology

## 2024-07-27 VITALS — BP 150/88 | Ht 74.0 in | Wt 289.0 lb

## 2024-07-27 DIAGNOSIS — M961 Postlaminectomy syndrome, not elsewhere classified: Secondary | ICD-10-CM

## 2024-07-27 DIAGNOSIS — M4802 Spinal stenosis, cervical region: Secondary | ICD-10-CM | POA: Diagnosis not present

## 2024-07-27 DIAGNOSIS — R200 Anesthesia of skin: Secondary | ICD-10-CM

## 2024-07-27 DIAGNOSIS — M5416 Radiculopathy, lumbar region: Secondary | ICD-10-CM | POA: Diagnosis not present

## 2024-07-27 DIAGNOSIS — M5412 Radiculopathy, cervical region: Secondary | ICD-10-CM

## 2024-07-27 DIAGNOSIS — R220 Localized swelling, mass and lump, head: Secondary | ICD-10-CM

## 2024-07-27 DIAGNOSIS — G5622 Lesion of ulnar nerve, left upper limb: Secondary | ICD-10-CM

## 2024-07-27 DIAGNOSIS — R202 Paresthesia of skin: Secondary | ICD-10-CM

## 2024-07-27 DIAGNOSIS — M542 Cervicalgia: Secondary | ICD-10-CM

## 2024-07-31 ENCOUNTER — Other Ambulatory Visit: Payer: Self-pay

## 2024-08-07 ENCOUNTER — Ambulatory Visit: Payer: Self-pay

## 2024-08-07 ENCOUNTER — Other Ambulatory Visit: Payer: Self-pay

## 2024-08-07 ENCOUNTER — Ambulatory Visit: Admitting: Licensed Clinical Social Worker

## 2024-08-07 DIAGNOSIS — G4733 Obstructive sleep apnea (adult) (pediatric): Secondary | ICD-10-CM

## 2024-08-07 DIAGNOSIS — F4329 Adjustment disorder with other symptoms: Secondary | ICD-10-CM

## 2024-08-07 NOTE — Patient Instructions (Addendum)
 . Resources for [facility], Psychological Evaluations, Medication Management and Therapists List is not all inclusive, nor do we recommend any specific agency/provider.   LOCATION NAME ADDRESS/ PHONE INSURANCE  AGE/SERVICE OFFERED  DEVELOPMENTAL-BEHAVIORAL CLINICS  [facility] (nurse practitioners only) [address], [place], [place] [address]  [phone]  Commercial and Medicaid  Children and Adolescents (3yo & up) Neuropsychological evaluation (49yo & up and Commercial only) Medication Management  [facility] (Developmental-Behavioral)  [facility])  Location in [place] [phone] Jeanenne [place]) [phone] ([facility])  Commercial and [place] Medicaid  Children and Adolescents (Birth-17 yo) 3-5 months wait Autism Evaluation Psychoeducational Evaluation Medication Management  [facility] (Only accepts referrals from a [facility] primary care or specialty provider) [address] [place], [place] [address] [phone]  Commercial and Medicaid  Ages 16months-49y/o Autism Evaluation  Medication Management  AUTISM & PSYCHOLOGIAL EVALUATIONS  [facility] (CDSA)  ([place], [place], [place], [place]) [phone] Commercial and Medicaid - not needed for evaluation  For children under the age of 3  Can assess developmental delays and connect families with therapies   [facility] PreK Arnulfo Luella Jo [phone] ext [phone] Commercial and Medicaid - not needed for evaluation  For children 3 y/o and up that have NOT started Kindergarten Psychoeducational Evaluation Autism Evaluation for Educational Classification Only Can implement an IEP and other therapies  [facility]  [phone] Commercial and Medicaid - not needed for evaluation For children 3 y/o and up  that have NOT started Kindergarten Psychoeducational Evaluation Autism Evaluation for Educational Classification Only Can implement an IEP and other therapies   [facility] (Offices in [place], [place] and [place]) [address], [place], [place] [address]  [phone] Commercial and Medicaid All ages (children, adolescents, and adults) Autism Evaluation Parent support and Education  [facility] (Fax referrals to [phone] or email to [email or web address]. Demographic info, provider note, insurance card) [phone] Locations in: [place] (6 month wait), [place] (2-5 month wait) and [place] (2-6 month wait). [place] clinic to open Summer 2022. Virtual option for ages 23 and under (2 month wait). Commercial and Medicaid Ages 64mo-49 y/o Autism Evaluation ABA therapy  [facility] (Offices in [place] and [place]) [address], [place], [place] [address]  [phone] Commercial only, no Medicaid Ages 3 and up (children, adolescents and adults) Autism Evaluation Psychoeducational Evaluation Therapy  [facility]   [phone] (Offices in [place], [place], [place], [place], [place] and Biomedical Engineer) Oceanographer and limited number of [employer], Adolescents and Adults Autism Evaluation Psychoeducational Evaluation Therapy  [facility] [address] [place], [place], [place] [address]  [phone] Commercial and Medicaid ([place] Health Choice) Ages 4 and up (Children, Adolescents and Adults) Autism Evaluation Psychoeducational Evaluation Therapy  [facility] (Developmental-Behavioral)  [facility] [place]) Location in [place] [phone] [place]) [phone] ([facility])  Commercial and [place] Medicaid Children and Adolescents (Birth-17 yo) 3-5 months wait Autism Evaluation Psychoeducational Evaluation Medication Management  [facility]  Neuropsychology-[place] Prefers that referral be faxed by PCP and then they will call the parent. [address]th Floor, [facility], [place], [place] [address] (6-9 months wait_ P: [phone] F: [phone]  Commercial and Medicaid  All ages (referrals are reviewed before they are accepted for eligbility) Autism Evaluation Neuropsychological Evaluations  [facility] (Only accepts referrals from a [facility] primary  care or specialty provider) [address] [place], [place] [address] [phone] Commercial and Medicaid Ages 64months-49y/o Autism Evaluation  Medication Management  [facility] [address], [place], [place] [address]  [phone] Commercial and Medicaid Ages 3 and older (children, adolescents, and adults) Psychoeducational Evaluation Therapy  CHILD & ADOLESCENT PSYCHIATRY  [facility]   [phone] (Offices in [place], [place],  [place] and [place] - Only [address] [place] accepting pediatric patients at this time 12/09/20. Commercial and Medicaid  Ages 5 and up (children, adolescents and adults) Therapy  Medication Management   [facility] [address] [place], [place] [address] [phone] Medicaid Ages 6 and up (children, adolescents and adults) Therapy Medication Management   [facility] (Offices in [place] and [place])  [address]  [place], [place], [place] [address]  [phone] Medicaid & Commercial (No [facility] or [facility])   Therapy (5-15 yo) Medication Mgmt (5-17yo)  [facility] [address] [place], [place] [address] Phone: [phone] Fax: [phone] Medicaid (Not [place]) Medicare Most commercial insurance Medication Management Therapy (Children & Adults   [facility]  [address] [place], [place] [address]  [phone] Commercial and Medicaid Ages 5 and up (children, adolescents, and adults) Medication  Management   [facility]  [address], [place], [place] [address]  [phone] Commercial and Medicaid Ages 4 and up (children, adolescents, and adults) Therapy Medication Management  [facility] and [place] [facility] [phone] - Daniel Mcalpine [phone] - Telepsychiatry Commercial and Medicaid Medication Management Therapy   [facility] [place], [place] [phone] Commercial and Medicaid Ages 4yo-49yo 3-4 months wait time Medication Management   Revised 10/28/2021  Using Behavioral Activation to manage stress/depression symptoms     Identify/understand your own mood triggers.   Structure your day--get up around the same time, eat meals/snacks around the same time, go to bed around the same time.   Purposefully schedule self care time and time to complete tasks. This can include quiet time.  Stimulate your brain--go for a walk, text/call a friend or family member, if you are indoors--go outside (and vice versa), go for a drive, go to a store with bright colors and bright lights. Try to do things in a different way--drive to your favorite places using an alternative route, or instead of starting on the right side of the grocery store when shopping, start on the left side. You might feel a bit uncomfortable doing things outside of the comfort zone, but this is helping the brain create new neural pathways and is very healthy for brain/emotional health.   Physical movement based on your ability. If you can go for a walk, do stretches, even waving your hands to music can trigger feel-good endorphins in the brain and help release physical tension we all hold in our bodies.  Even 5 minutes can make a difference.   Be intentional about doing things that bring you joy (or used to bring you joy), and look for the things in every day that make you happy.  Seek those glimmers of joy each day.  Set a timer for  5 minutes for a harder task (ex. Laundry, washing dishes).  Allow yourself to work distraction-free for 5 minutes, then stop when the timer goes off. If you need a break, take a break. If you want to continue working then  set another timer for whatever time you choose.   Limit or eliminate substance use including alcohol, marijuana, or recreational use of prescription medication.  Let in the light!! Open the window blinds, curtains and let natural light in. Even sitting near a window or sitting outside can boost your mood, especially in the wintertime when there is less daylight.   If you take medication to manage symptoms, remember to take all medications as they are prescribed (please read all labels!!)    Things to envision for ourselves to to improve inspiration, motivation, and initiative :  improving physical wellness, focus on family relationships, focusing on our own mental/emotional well being, being a part of a bigger community, finding a hobby, being a part of something that fosters personal growth, engaging socially with others (even digitally!!!)     Emergency Resources:  National Suicide & Crisis Lifeline: Call or text 988  Crisis Text Line: Text HOME to 351-150-6240  [facility]  [address], [place], [place] [address] [phone] or [phone] WALK-IN URGENT CARE 24/7 FOR ANYONE [address] [place], [place]  [phone] Fax: [phone] [email or web address] *Interpreters available *Accepts all insurance and uninsured for Urgent Care needs *Accepts Medicaid and uninsured for outpatient treatment (below)
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
## 2024-08-09 ENCOUNTER — Telehealth (INDEPENDENT_AMBULATORY_CARE_PROVIDER_SITE_OTHER): Payer: Self-pay | Admitting: Licensed Clinical Social Worker

## 2024-08-09 DIAGNOSIS — F4329 Adjustment disorder with other symptoms: Secondary | ICD-10-CM | POA: Diagnosis not present
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
## 2024-08-29 ENCOUNTER — Other Ambulatory Visit: Payer: Self-pay

## 2024-08-30 ENCOUNTER — Ambulatory Visit: Attending: Neurosurgery

## 2024-08-30 DIAGNOSIS — R29898 Other symptoms and signs involving the musculoskeletal system: Secondary | ICD-10-CM | POA: Diagnosis present

## 2024-08-30 DIAGNOSIS — G894 Chronic pain syndrome: Secondary | ICD-10-CM | POA: Insufficient documentation

## 2024-08-30 DIAGNOSIS — M5459 Other low back pain: Secondary | ICD-10-CM | POA: Diagnosis present

## 2024-08-30 DIAGNOSIS — G8929 Other chronic pain: Secondary | ICD-10-CM | POA: Insufficient documentation

## 2024-08-30 DIAGNOSIS — Z79899 Other long term (current) drug therapy: Secondary | ICD-10-CM | POA: Insufficient documentation

## 2024-08-30 DIAGNOSIS — M79602 Pain in left arm: Secondary | ICD-10-CM | POA: Diagnosis present

## 2024-08-30 DIAGNOSIS — M5386 Other specified dorsopathies, lumbar region: Secondary | ICD-10-CM | POA: Insufficient documentation

## 2024-08-30 DIAGNOSIS — M542 Cervicalgia: Secondary | ICD-10-CM | POA: Insufficient documentation

## 2024-08-30 DIAGNOSIS — G5622 Lesion of ulnar nerve, left upper limb: Secondary | ICD-10-CM | POA: Insufficient documentation

## 2024-08-30 DIAGNOSIS — M5412 Radiculopathy, cervical region: Secondary | ICD-10-CM | POA: Insufficient documentation

## 2024-08-30 DIAGNOSIS — Z789 Other specified health status: Secondary | ICD-10-CM | POA: Insufficient documentation

## 2024-08-30 DIAGNOSIS — M961 Postlaminectomy syndrome, not elsewhere classified: Secondary | ICD-10-CM | POA: Diagnosis not present

## 2024-08-30 DIAGNOSIS — M899 Disorder of bone, unspecified: Secondary | ICD-10-CM | POA: Insufficient documentation

## 2024-08-30 DIAGNOSIS — M5416 Radiculopathy, lumbar region: Secondary | ICD-10-CM | POA: Diagnosis not present

## 2024-08-30 DIAGNOSIS — M5090 Cervical disc disorder, unspecified, unspecified cervical region: Secondary | ICD-10-CM | POA: Diagnosis present

## 2024-08-30 NOTE — Therapy (Signed)
 " OUTPATIENT PHYSICAL THERAPY CERVICAL/LUMBAR EVALUATION   Patient Name: Dean Ford MRN: [id number] DOB:28-Apr-1976, 49 y.o., male Today's Date: 08/30/2024  END OF SESSION:  PT End of Session - 08/30/24 0950     Visit Number 1    Number of Visits 17    Date for Recertification  10/25/24    PT Start Time 0946    PT Stop Time 1030    PT Time Calculation (min) 44 min    Activity Tolerance Patient tolerated treatment well         Past Medical History:  Diagnosis Date   Acute lumbar radiculopathy (Right) (S1) 05/26/2016   Acute myofascial pain 05/04/2017   Acute postoperative pain 06/22/2019   Adrenal insufficiency    Anxiety    Anxiety disorder 01/28/2015   Arthritis    Back pain, chronic 06/03/2015   Hypertension    Hypothyroid    Sleep apnea    Thyroid  disease    Past Surgical History:  Procedure Laterality Date   BACK SURGERY     COLONOSCOPY WITH PROPOFOL  N/A 02/23/2019   Procedure: COLONOSCOPY WITH PROPOFOL ;  Surgeon: Janalyn Keene NOVAK, MD;  Location: [facility] END[facility]COPY;  Service: Endoscopy;  Laterality: N/A;   discectomy N/A 2017, 2018, 2019   x3  L5-S1   ELBOW SURGERY     right elbow   ESOPHAGOGASTRODUODENOSCOPY (EGD) WITH PROPOFOL  N/A 08/15/2020   Procedure: ESOPHAGOGASTRODUODENOSCOPY (EGD) WITH PROPOFOL ;  Surgeon: Jinny Carmine, MD;  Location: [facility] ENDOSCOPY;  Service: Endoscopy;  Laterality: N/A;   Patient Active Problem List   Diagnosis Date Noted   Cervicalgia 06/28/2024   Lumbar radiculopathy 06/28/2024   Diverticulitis 12/02/2023   Inattention 11/24/2023   Stricture and stenosis of esophagus    Gastroesophageal reflux disease 07/26/2020   Nondiabetic gastroparesis 05/22/2020    Class: History of   History of marijuana use 05/18/2020   Pain medication agreement broken 05/18/2020   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) 05/01/2019   Rectal polyp    Hypogonadism male 09/28/2018   Rheumatoid arthritis (HCC) 09/28/2018   Carpal  tunnel syndrome 09/28/2018   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/2016   Osteoarthritis of hip (Right) 01/22/2016   Chronic low back pain (Primary Area of Pain) (Bilateral) (R>L) 12/18/2015   Erectile dysfunction 12/18/2015   Lumbar facet hypertrophy (Multilevel) 06/11/2015   Lumbosacral radiculopathy at S1 (right-sided) 06/11/2015   Lumbar spondylosis 06/03/2015   Avitaminosis D 06/03/2015   Class 2 severe obesity due to excess calories with serious comorbidity and body mass index (BMI) of 38.0 to 38.9 in adult 06/03/2015   DDD (degenerative disc disease), lumbar 06/03/2015   L4-L5 disc bulge 06/03/2015   Ligamentum flavum hypertrophy 06/03/2015   OSA (obstructive sleep apnea) 06/03/2015   Mixed dyslipidemia 03/01/2015   Essential hypertension 07/20/2014   Hypothyroidism 07/20/2014   Adrenal insufficiency 07/20/2014    PCP: Franchot Isaiah LABOR, MD   REFERRING PROVIDER: Clois Fret, MD   REFERRING DIAG:  M54.2 (ICD-10-CM) - Cervicalgia  M54.12 (ICD-10-CM) - Cervical radiculopathy  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  Pt reports that he still has reduced sensation in the R LE and never fully recovered from it.    Pt is now having cervical pain at this point in time, and he feels as though this  is the most pressing issue at the moment.  Pt notes that the MRI shows the complications to be more on the R side, however the pt is reporting significant pain and radicular symptoms in the L UE.  Pt notes that there is a hard mass in the cervical region as well and feels that it is complicating issues.  Pt [facility] in [place].   Hand dominance: Right  PERTINENT HISTORY:   Per MD: [name] is here today with a chief complaint of chronic neck and back pain who presents with worsening neck pain and left arm symptoms.   He has had neck problems for about ten years with worsening symptoms over the past few months.   He now has neck pain radiating into the left arm with numbness and tingling into the fourth and fifth fingers. Coughing or sneezing worsens the pain, which can travel up the arm. He notes tightness around the left shoulder blade and muscle tension. Chiropractic treatment for three weeks (without spinal manipulation) has reduced muscle knots but has not relieved the radicular symptoms.   He has had three prior back surgeries, most recently in late 2019 or early 2020 in [place], with persistent right leg numbness since that time. He also reports intermittent numbness in the groin that has improved with chiropractic care. He is on full disability for spine and other medical issues.  PAIN:  Are you having pain? Yes: NPRS scale: 6/10 current; 7/10 at worst Pain location: cervical pain Pain description: throbbing, stabbing Aggravating factors: sitting Relieving factors: raising head and   PRECAUTIONS: None  RED FLAGS: Cauda equina syndrome: Yes: Pt reports that he feels numbness during intercourse and when sitting for prolonged period of time.  Pt notes that he is also not feeling the flow during urination.     WEIGHT BEARING RESTRICTIONS: No  FALLS:  Has patient fallen in last 6 months?  No  LIVING ENVIRONMENT: Lives with: lives with their family Lives in: House/apartment Stairs: Yes: Internal: 15 steps; on right going up and External: 3 steps; on right going up, on left going up, and bilateral but cannot reach both Has following equipment at home: None  OCCUPATION: On disability.  PLOF: Independent  PATIENT GOALS: Pt   NEXT MD VISIT: 09/13/24  OBJECTIVE:  Note: Objective measures were completed at Evaluation unless otherwise noted.  DIAGNOSTIC FINDINGS:   EXAM: MRI CERVICAL SPINE WITHOUT CONTRAST 07/05/2024 04:34:08 PM  IMPRESSION: 1. Broad-based disc osteophyte complex at C5-6 causing moderate canal stenosis to 7 mm with ventral cord contour distortion and severe foraminal narrowing, worse on the right. 2. Moderate-to-severe right and moderate left foraminal stenosis at C4-5 due to rightward disc osteophyte complex. 3. Moderate right foraminal stenosis at C3-4 due to rightward disc osteophyte complex. 4. Moderate foraminal narrowing, worse on the right, at C6-7 due to broad-based disc osteophyte complex. 5. Mild right foraminal stenosis at C2-3 due to rightward disc osteophyte complex. 6. Slight retrolisthesis at C5-6 and C6-7.  PATIENT SURVEYS:  Oswestry Low Back Pain Disability Questionnaire:  OSWESTRY LBP DISABILITY Questionnaire  Date: 08/30/2024 Score  Pain intensity 3 The pain is fairly severe at the moment.  2. Personal care (washing, dressing, etc.) 3 I need some help but manage most of my  personal care.  3. Lifting 2 Pain prevents me from lifting heavy weights off  the floor, but I can manage if they are conveniently  placed, eg. on a table.  4. Walking 3 Pain prevents me from walking more than 100 yards.   5. Sitting 2 Pain prevents me sitting more than one hour.   6. Standing 4 Pain prevents me from standing for more than  10 minutes.  7. Sleeping 3 Because of pain I have less than 4 hours sleep.   8. Sex life (if applicable) 3 My sex  life is severely restricted by pain.   9. Social life 3 Pain has restricted my social life and I do not go out  as often.  10. Traveling 3 Pain restricts me to journeys of less than one hour.   Total 29/50; 58%   Interpretation of scores: Score Category Description  0-20% Minimal Disability The patient can cope with most living activities. Usually no treatment is indicated apart from advice on lifting, sitting and exercise  21-40% Moderate Disability The patient experiences more pain and difficulty with sitting, lifting and standing. Travel and social life are more difficult and they may be disabled from work. Personal care, sexual activity and sleeping are not grossly affected, and the patient can usually be managed by conservative means  41-60% Severe Disability Pain remains the main problem in this group, but activities of daily living are affected. These patients require a detailed investigation  61-80% Crippled Back pain impinges on all aspects of the patients life. Positive intervention is required  81-100% Bed-bound These patients are either bed-bound or exaggerating their symptoms  Reference: Adelle SPEAK, Pynsent PB. The Oswestry Disability Index. Spine 2000 Nov 15;25(22):2940-52; discussion 49.  Minimum detectable change (90% confidence): 10% points  NDI:  NECK DISABILITY INDEX  Date: 08/30/2024 Score  Pain intensity 3 = The pain is fairly severe at the moment  2. Personal care (washing, dressing, etc.) 3 =  I need some help but can manage most of my personal care  3. Lifting 2 = Pain prevents me lifting heavy weights off the floor, but I can manage if they are  conveniently placed, for example on a table  4. Reading 4 =  I can hardly read at all because of severe pain in my neck  5. Headaches 0 = I have no headaches at all  6. Concentration 1 =  I can concentrate fully when I want to with slight difficulty   7. Work 4 = I can hardly do any work at all  8. Driving 3 = I can't drive  my car as long as I want because of moderate pain in my neck  9. Sleeping 1 = My sleep is slightly disturbed (less than 1 hr sleepless)  10. Recreation 4 =  I can hardly do any recreation activities because of pain in my neck  Total 25/50   Minimum Detectable Change (90% confidence): 5 points or 10% points  COGNITION: Overall cognitive status: Within functional limits for tasks assessed  SENSATION: WFL  POSTURE: rounded shoulders and forward head  PALPATION: Pt has a palpable mass on the L side of the transverse process of the C7-T1 region of the neck.   CERVICAL ROM:   Active ROM A/PROM (deg) eval  Flexion 50  Extension 43  Right lateral flexion 34  Left lateral flexion 35  Right rotation 52  Left rotation 43   (Blank rows = not tested)  UPPER EXTREMITY ROM:  WNL for flexion and abduction bilaterally  UPPER EXTREMITY MMT:  MMT Right eval Left eval  Shoulder flexion 5 5  Shoulder abduction 5 5   (Blank rows = not tested)  CERVICAL SPECIAL TESTS:  Not performed at this time due to time restratints.    TREATMENT DATE: 08/30/2024                                                                                                                                Self-Care/Home Management:  Pt given education on current POC, the findings of the evaluation, and the ways in which skilled therapy can address the current deficits in lumbar and cervical pain.  Pt instructed on how this can improve their overall function and maintain/improve their overall quality of life.    Messaged Dr. Clois in regards to the mass felt in the cervical spine, along with the numbness that the pt feels during intercourse and sitting for a prolonged period of time.  MD also notified in regards to the lack of feeling flow when urinating.      PATIENT EDUCATION:  Education details: Pt educated on role of PT and services provided during current POC, along with prognosis and information about  the clinic.  Person educated: Patient Education method: Explanation Education comprehension: verbalized understanding  HOME EXERCISE PROGRAM: To be given at next visit  ASSESSMENT:  CLINICAL IMPRESSION: Patient is a 49 y.o. male who was seen today for physical therapy evaluation and treatment for cervical and lumbar back pain.  During evaluation, pt noted to have a palpable mass on the spine at the C7-T1 transverse processes and pt feels as though that is the origination of his pain in that area.  The MRI does not state anything about this mass from November and so referring provider was notified.  Pt also questioned about cauda equina symptoms and pt reports that he is experiencing numbness and tinging in the genital region specific to intercourse regardless of position and also supports not being able to feel the flow of his urine when voiding.  Pt notes that he has had a few accidents in which he was not completely done voiding and ended up wetting himself due to the lack of sensation.  MD notified of this and MD stated that it was not because of the lumber region as he only has R L5/S1 foraminal stenosis and stated that he was having a follow-up with the pt in 3 weeks and would send out a referral to urology if pt elected to do so.  Pt made aware of this and he reported that he would call for the referral to urology.  Pt ultimately is displaying muscle tension in the lumbar and cervical region that can  be address by skilled therapy, with primary focus place on the cervical region as this is where his pain is heightened the most.  Pt presents with physical impairments of decreased activity tolerance, decreased ROM of cervical spine,  increased pain in cervical spine and low back as noted.  Pt will benefit from skilled therapy to address tolerance, ROM, and pain impairments necessary for improvement in quality of life.  Pt. demonstrates understanding of this plan of care and agrees with this  plan.   OBJECTIVE IMPAIRMENTS: decreased activity tolerance, decreased knowledge of condition, decreased mobility, difficulty walking, decreased ROM, hypomobility, impaired UE functional use, postural dysfunction, obesity, and pain.   ACTIVITY LIMITATIONS: carrying, lifting, bending, sitting, standing, squatting, continence, and locomotion level  PARTICIPATION LIMITATIONS: meal prep, cleaning, laundry, interpersonal relationship, driving, shopping, community activity, and yard work  PERSONAL FACTORS: Past/current experiences, Time since onset of injury/illness/exacerbation, and 3+ comorbidities: prior discectomy, anxiety, HTN, chronic back pain, HTN, sleep apnea are also affecting patient's functional outcome.   REHAB POTENTIAL: Fair ongoing issue in which surgicl intervention has not assisted in overall pain and recovery.  CLINICAL DECISION MAKING: Evolving/moderate complexity  EVALUATION COMPLEXITY: Moderate   GOALS: Goals reviewed with patient? Yes  SHORT TERM GOALS: Target date: 09/27/2024  Pt will be independent with HEP in order to demonstrate increased ability to perform tasks related to occupation/hobbies. Baseline: To be given at next visit Goal status: INITIAL  LONG TERM GOALS: Target date: 10/25/2024  Pt will improve the Oswestry Disability Questionnaire to be </=14 to demonstrate increased functional ability to perform tasks of daily living. Baseline: 29/50 Goal status: INITIAL  2.  Pt will demonstrate decrease in NDI by at least 19% in order to demonstrate clinically significant reduction in disability related to neck injury/pain. Baseline: 50% Goal status: INITIAL  3.  Pt will reduce overall pain level to 2/10 by utilizing a combination of stretching, strengthening exercises, and pain-reducing modalities in order to improve overall QoL. Baseline: 6/10 currently Goal status: INITIAL  PLAN:  PT FREQUENCY: 2x/week  PT DURATION: 8 weeks  PLANNED INTERVENTIONS:  97750- Physical Performance Testing, 97110-Therapeutic exercises, 97530- Therapeutic activity, 97112- Neuromuscular re-education, 97535- Self Care, [id number]- Manual therapy, 20560 (1-2 muscles), 20561 (3+ muscles)- Dry Needling, Patient/Family education, Joint mobilization, Spinal mobilization, Cryotherapy, and Moist heat  PLAN FOR NEXT SESSION:   Discuss if pt called about urology referral, assess T11-L1 vertebrae for potential referral into the genital region, generate HEP, STM to the cervical region avoiding the mass around the C7-T1 L side of the transverse process,  stretches to the UT's   Fonda Simpers, PT, DPT Physical Therapist - [facility] [place]  08/30/2024, 12:42 PM       "

## 2024-08-31 ENCOUNTER — Other Ambulatory Visit: Payer: Self-pay

## 2024-09-05 ENCOUNTER — Ambulatory Visit

## 2024-09-05 DIAGNOSIS — M542 Cervicalgia: Secondary | ICD-10-CM | POA: Diagnosis not present

## 2024-09-05 DIAGNOSIS — R29898 Other symptoms and signs involving the musculoskeletal system: Secondary | ICD-10-CM

## 2024-09-05 DIAGNOSIS — M5386 Other specified dorsopathies, lumbar region: Secondary | ICD-10-CM

## 2024-09-05 DIAGNOSIS — M5459 Other low back pain: Secondary | ICD-10-CM

## 2024-09-05 NOTE — Therapy (Signed)
 " OUTPATIENT PHYSICAL THERAPY CERVICAL/LUMBAR TREATMENT   Patient Name: RAFORD BRISSETT MRN: [id number] DOB:1976-04-20, 49 y.o., male Today's Date: 09/05/2024  END OF SESSION:  PT End of Session - 09/05/24 1300     Visit Number 2    Number of Visits 17    Date for Recertification  10/25/24    PT Start Time 1300    PT Stop Time 1345    PT Time Calculation (min) 45 min    Activity Tolerance Patient tolerated treatment well         Past Medical History:  Diagnosis Date   Acute lumbar radiculopathy (Right) (S1) 05/26/2016   Acute myofascial pain 05/04/2017   Acute postoperative pain 06/22/2019   Adrenal insufficiency    Anxiety    Anxiety disorder 01/28/2015   Arthritis    Back pain, chronic 06/03/2015   Hypertension    Hypothyroid    Sleep apnea    Thyroid  disease    Past Surgical History:  Procedure Laterality Date   BACK SURGERY     COLONOSCOPY WITH PROPOFOL  N/A 02/23/2019   Procedure: COLONOSCOPY WITH PROPOFOL ;  Surgeon: Janalyn Keene NOVAK, MD;  Location: [facility];  Service: Endoscopy;  Laterality: N/A;   discectomy N/A 2017, 2018, 2019   x3  L5-S1   ELBOW SURGERY     right elbow   ESOPHAGOGASTRODUODENOSCOPY (EGD) WITH PROPOFOL  N/A 08/15/2020   Procedure: ESOPHAGOGASTRODUODENOSCOPY (EGD) WITH PROPOFOL ;  Surgeon: Jinny Carmine, MD;  Location: [facility] ENDOSCOPY;  Service: Endoscopy;  Laterality: N/A;   Patient Active Problem List   Diagnosis Date Noted   Cervicalgia 06/28/2024   Lumbar radiculopathy 06/28/2024   Diverticulitis 12/02/2023   Inattention 11/24/2023   Stricture and stenosis of esophagus    Gastroesophageal reflux disease 07/26/2020   Nondiabetic gastroparesis 05/22/2020    Class: History of   History of marijuana use 05/18/2020   Pain medication agreement broken 05/18/2020   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) 05/01/2019   Rectal polyp    Hypogonadism male 09/28/2018   Rheumatoid arthritis (HCC) 09/28/2018   Carpal tunnel  syndrome 09/28/2018   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/2016   Osteoarthritis of hip (Right) 01/22/2016   Chronic low back pain (Primary Area of Pain) (Bilateral) (R>L) 12/18/2015   Erectile dysfunction 12/18/2015   Lumbar facet hypertrophy (Multilevel) 06/11/2015   Lumbosacral radiculopathy at S1 (right-sided) 06/11/2015   Lumbar spondylosis 06/03/2015   Avitaminosis D 06/03/2015   Class 2 severe obesity due to excess calories with serious comorbidity and body mass index (BMI) of 38.0 to 38.9 in adult 06/03/2015   DDD (degenerative disc disease), lumbar 06/03/2015   L4-L5 disc bulge 06/03/2015   Ligamentum flavum hypertrophy 06/03/2015   OSA (obstructive sleep apnea) 06/03/2015   Mixed dyslipidemia 03/01/2015   Essential hypertension 07/20/2014   Hypothyroidism 07/20/2014   Adrenal insufficiency 07/20/2014    PCP: Franchot Isaiah LABOR, MD   REFERRING PROVIDER: Clois Fret, MD   REFERRING DIAG:  M54.2 (ICD-10-CM) - Cervicalgia  M54.12 (ICD-10-CM) - Cervical radiculopathy  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  Pt notes 6/10 pain today upon arrival to the clinic.    Eval: Pt reports that he had 3 discectomies in 3 years back in 2017-2019 in the lumbar  spine.  Pt reports that he still has reduced sensation in the R LE and never fully recovered from it.    Pt is now having cervical pain at this point in time, and he feels as though this is the most pressing issue at the moment.  Pt notes that the MRI shows the complications to be more on the R side, however the pt is reporting significant pain and radicular symptoms in the L UE.  Pt notes that there is a hard mass in the cervical region as well and feels that it is complicating issues.  [facility] in [place].   Hand dominance: Right  PERTINENT HISTORY:   Per MD: Mr. Tommie Bohlken is here today with a chief complaint of chronic neck and back pain who presents with worsening neck pain and left arm symptoms.   He has had neck problems for about ten years with worsening symptoms over the past few months.   He now has neck pain radiating into the left arm with numbness and tingling into the fourth and fifth fingers. Coughing or sneezing worsens the pain, which can travel up the arm. He notes tightness around the left shoulder blade and muscle tension. Chiropractic treatment for three weeks (without spinal manipulation) has reduced muscle knots but has not relieved the radicular symptoms.   He has had three prior back surgeries, most recently in late 2019 or early 2020 in [place], with persistent right leg numbness since that time. He also reports intermittent numbness in the groin that has improved with chiropractic care. He is on full disability for spine and other medical issues.  PAIN:  Are you having pain? Yes: NPRS scale: 6/10 current; 7/10 at worst Pain location: cervical pain Pain description: throbbing, stabbing Aggravating factors: sitting Relieving factors: raising head and   PRECAUTIONS: None  RED FLAGS: Cauda equina syndrome: Yes: Pt reports that he feels numbness during intercourse and when sitting  for prolonged period of time.  Pt notes that he is also not feeling the flow during urination.     WEIGHT BEARING RESTRICTIONS: No  FALLS:  Has patient fallen in last 6 months? No  LIVING ENVIRONMENT: Lives with: lives with their family Lives in: House/apartment Stairs: Yes: Internal: 15 steps; on right going up and External: 3 steps; on right going up, on left going up, and bilateral but cannot reach both Has following equipment at home: None  OCCUPATION: On disability.  PLOF: Independent  PATIENT GOALS: Pt   NEXT MD VISIT: 09/13/24  OBJECTIVE:  Note: Objective measures were completed at Evaluation unless otherwise noted.  DIAGNOSTIC FINDINGS:   EXAM: MRI CERVICAL SPINE WITHOUT CONTRAST 07/05/2024 04:34:08 PM  IMPRESSION: 1. Broad-based disc osteophyte complex at C5-6 causing moderate canal stenosis to 7 mm with ventral cord contour distortion and severe foraminal narrowing, worse on the right. 2. Moderate-to-severe right and moderate left foraminal stenosis at C4-5 due to rightward disc osteophyte complex. 3. Moderate right foraminal stenosis at C3-4 due to rightward disc osteophyte complex. 4. Moderate foraminal narrowing, worse on the right, at C6-7 due to broad-based  disc osteophyte complex. 5. Mild right foraminal stenosis at C2-3 due to rightward disc osteophyte complex. 6. Slight retrolisthesis at C5-6 and C6-7.  PATIENT SURVEYS:  Oswestry Low Back Pain Disability Questionnaire:  OSWESTRY LBP DISABILITY Questionnaire  Date: 08/30/2024 Score  Pain intensity 3 The pain is fairly severe at the moment.  2. Personal care (washing, dressing, etc.) 3 I need some help but manage most of my  personal care.  3. Lifting 2 Pain prevents me from lifting heavy weights off  the floor, but I can manage if they are conveniently  placed, eg. on a table.  4. Walking 3 Pain prevents me from walking more than 100 yards.   5. Sitting 2 Pain prevents me sitting more than one hour.    6. Standing 4 Pain prevents me from standing for more than  10 minutes.  7. Sleeping 3 Because of pain I have less than 4 hours sleep.   8. Sex life (if applicable) 3 My sex life is severely restricted by pain.   9. Social life 3 Pain has restricted my social life and I do not go out  as often.  10. Traveling 3 Pain restricts me to journeys of less than one hour.   Total 29/50; 58%   Interpretation of scores: Score Category Description  0-20% Minimal Disability The patient can cope with most living activities. Usually no treatment is indicated apart from advice on lifting, sitting and exercise  21-40% Moderate Disability The patient experiences more pain and difficulty with sitting, lifting and standing. Travel and social life are more difficult and they may be disabled from work. Personal care, sexual activity and sleeping are not grossly affected, and the patient can usually be managed by conservative means  41-60% Severe Disability Pain remains the main problem in this group, but activities of daily living are affected. These patients require a detailed investigation  61-80% Crippled Back pain impinges on all aspects of the patients life. Positive intervention is required  81-100% Bed-bound These patients are either bed-bound or exaggerating their symptoms  Reference: Adelle SPEAK, [place] PB. The Oswestry Disability Index. Spine 2000 Nov 15;25(22):2940-52; discussion 66.  Minimum detectable change (90% confidence): 10% points  NDI:  NECK DISABILITY INDEX  Date: 08/30/2024 Score  Pain intensity 3 = The pain is fairly severe at the moment  2. Personal care (washing, dressing, etc.) 3 =  I need some help but can manage most of my personal care  3. Lifting 2 = Pain prevents me lifting heavy weights off the floor, but I can manage if they are  conveniently placed, for example on a table  4. Reading 4 =  I can hardly read at all because of severe pain in my neck  5. Headaches 0 = I have no  headaches at all  6. Concentration 1 =  I can concentrate fully when I want to with slight difficulty   7. Work 4 = I can hardly do any work at all  8. Driving 3 = I can't drive my car as long as I want because of moderate pain in my neck  9. Sleeping 1 = My sleep is slightly disturbed (less than 1 hr sleepless)  10. Recreation 4 =  I can hardly do any recreation activities because of pain in my neck  Total 25/50   Minimum Detectable Change (90% confidence): 5 points or 10% points  COGNITION: Overall cognitive status: Within functional limits for tasks assessed  SENSATION: WFL  POSTURE:  rounded shoulders and forward head  PALPATION: Pt has a palpable mass on the L side of the transverse process of the C7-T1 region of the neck.   CERVICAL ROM:   Active ROM A/PROM (deg) eval  Flexion 50  Extension 43  Right lateral flexion 34  Left lateral flexion 35  Right rotation 52  Left rotation 43   (Blank rows = not tested)  UPPER EXTREMITY ROM:  WNL for flexion and abduction bilaterally  UPPER EXTREMITY MMT:  MMT Right eval Left eval  Shoulder flexion 5 5  Shoulder abduction 5 5   (Blank rows = not tested)  CERVICAL SPECIAL TESTS:  Not performed at this time due to time restratints.    TREATMENT DATE: 09/05/2024  TherEx:  Seated cervical rotation SNAG's with towel, x10 each direction Seated cervical extension SNAG's with towel, x10 Seated UT stretch, L side only, 30 sec bouts x2 Seated levator stretch, L side only, 30 sec bouts x2 Seated scapular retractions, 2x10 with pt noting increased mobility in the thoracic region with performance of the exercises   Manual:  Supine STM to cervical region to increase extensibility of the paraspinals Supine suboccipital release technique to decrease cervicalgia Supine manual traction performed in order to increase joint space in cervical region for pain relief Supine cervical upglides/downglides, 30 sec bouts Supine  UT/Levator stretch, 30 sec bouts to increase tissue extensibility of the cervical region    PATIENT EDUCATION:  Education details: Pt educated on role of PT and services provided during current POC, along with prognosis and information about the clinic.  Person educated: Patient Education method: Explanation Education comprehension: verbalized understanding  HOME EXERCISE PROGRAM:  Access Code: [email or web address] URL: [email or web address] Date: 09/05/2024 Prepared by: Sidra Simpers  Exercises - Seated Scapular Retraction  - 1 x daily - 7 x weekly - 3 sets - 10 reps - Seated Cervical Retraction  - 1 x daily - 7 x weekly - 3 sets - 10 reps - Seated Levator Scapulae Stretch  - 1 x daily - 7 x weekly - 1 sets - 2 reps - 30 hold - Seated Upper Trapezius Stretch  - 2 x daily - 7 x weekly - 1 sets - 2 reps - 30 hold - Seated Assisted Cervical Rotation with Towel  - 1 x daily - 7 x weekly - 3 sets - 10 reps - Cervical Extension AROM with Strap  - 1 x daily - 7 x weekly - 3 sets - 10 reps  ASSESSMENT:  CLINICAL IMPRESSION:  Patient responded well to the manual therapy approach and noted a reduction in overall tightness in the cervical region.  Patient also introduced to manual techniques that could be applied at home with use of towel for SNAG's in order to improve the overall cervical range of motion.  Patient also encouraged to continue to reach out to the urology to set up an appointment based off of prior evaluation and noting some numbness and tingling in the genital area as well as lack of sensation when voiding.  Therapist expressed concern regarding the symptoms and again suggested that therapist reach out to urology for consult.   Pt will continue to benefit from skilled therapy to address remaining deficits in order to improve overall QoL and return to PLOF.    Note: Portions of this document were prepared using Dragon voice recognition software and although reviewed may contain  unintentional dictation errors in syntax, grammar, or spelling.    OBJECTIVE IMPAIRMENTS: decreased activity tolerance,  decreased knowledge of condition, decreased mobility, difficulty walking, decreased ROM, hypomobility, impaired UE functional use, postural dysfunction, obesity, and pain.   ACTIVITY LIMITATIONS: carrying, lifting, bending, sitting, standing, squatting, continence, and locomotion level  PARTICIPATION LIMITATIONS: meal prep, cleaning, laundry, interpersonal relationship, driving, shopping, community activity, and yard work  PERSONAL FACTORS: Past/current experiences, Time since onset of injury/illness/exacerbation, and 3+ comorbidities: prior discectomy, anxiety, HTN, chronic back pain, HTN, sleep apnea are also affecting patient's functional outcome.   REHAB POTENTIAL: Fair ongoing issue in which surgicl intervention has not assisted in overall pain and recovery.  CLINICAL DECISION MAKING: Evolving/moderate complexity  EVALUATION COMPLEXITY: Moderate   GOALS: Goals reviewed with patient? Yes  SHORT TERM GOALS: Target date: 09/27/2024  Pt will be independent with HEP in order to demonstrate increased ability to perform tasks related to occupation/hobbies. Baseline: To be given at next visit Goal status: INITIAL  LONG TERM GOALS: Target date: 10/25/2024  Pt will improve the Oswestry Disability Questionnaire to be </=14 to demonstrate increased functional ability to perform tasks of daily living. Baseline: 29/50 Goal status: INITIAL  2.  Pt will demonstrate decrease in NDI by at least 19% in order to demonstrate clinically significant reduction in disability related to neck injury/pain. Baseline: 50% Goal status: INITIAL  3.  Pt will reduce overall pain level to 2/10 by utilizing a combination of stretching, strengthening exercises, and pain-reducing modalities in order to improve overall QoL. Baseline: 6/10 currently Goal status: INITIAL  PLAN:  PT FREQUENCY:  2x/week  PT DURATION: 8 weeks  PLANNED INTERVENTIONS: 97750- Physical Performance Testing, 97110-Therapeutic exercises, 97530- Therapeutic activity, 97112- Neuromuscular re-education, 97535- Self Care, [id number]- Manual therapy, 20560 (1-2 muscles), 20561 (3+ muscles)- Dry Needling, Patient/Family education, Joint mobilization, Spinal mobilization, Cryotherapy, and Moist heat  PLAN FOR NEXT SESSION:   Discuss if pt called about urology referral, assess T11-L1 vertebrae for potential referral into the genital region, generate HEP, STM to the cervical region avoiding the mass around the C7-T1 L side of the transverse process,  stretches to the UT's    Fonda Simpers, PT, DPT Physical Therapist - [facility]  09/05/24, 1:53 PM  "

## 2024-09-07 ENCOUNTER — Ambulatory Visit

## 2024-09-07 ENCOUNTER — Ambulatory Visit: Attending: Otolaryngology

## 2024-09-07 DIAGNOSIS — R400 Somnolence: Secondary | ICD-10-CM | POA: Diagnosis not present

## 2024-09-07 DIAGNOSIS — R0683 Snoring: Secondary | ICD-10-CM | POA: Diagnosis not present

## 2024-09-07 DIAGNOSIS — M542 Cervicalgia: Secondary | ICD-10-CM

## 2024-09-07 DIAGNOSIS — G4733 Obstructive sleep apnea (adult) (pediatric): Secondary | ICD-10-CM | POA: Insufficient documentation

## 2024-09-07 DIAGNOSIS — M5386 Other specified dorsopathies, lumbar region: Secondary | ICD-10-CM

## 2024-09-07 DIAGNOSIS — M5459 Other low back pain: Secondary | ICD-10-CM

## 2024-09-07 DIAGNOSIS — R29898 Other symptoms and signs involving the musculoskeletal system: Secondary | ICD-10-CM

## 2024-09-07 NOTE — Therapy (Signed)
 " OUTPATIENT PHYSICAL THERAPY CERVICAL/LUMBAR TREATMENT   Patient Name: Dean Ford MRN: [id number] DOB:1976-03-29, 49 y.o., male Today's Date: 09/07/2024  END OF SESSION:  PT End of Session - 09/07/24 1606     Visit Number 3    Number of Visits 17    Date for Recertification  10/25/24    PT Start Time 1606    PT Stop Time 1645    PT Time Calculation (min) 39 min    Activity Tolerance Patient tolerated treatment well          Past Medical History:  Diagnosis Date   Acute lumbar radiculopathy (Right) (S1) 05/26/2016   Acute myofascial pain 05/04/2017   Acute postoperative pain 06/22/2019   Adrenal insufficiency    Anxiety    Anxiety disorder 01/28/2015   Arthritis    Back pain, chronic 06/03/2015   Hypertension    Hypothyroid    Sleep apnea    Thyroid  disease    Past Surgical History:  Procedure Laterality Date   BACK SURGERY     COLONOSCOPY WITH PROPOFOL  N/A 02/23/2019   Procedure: COLONOSCOPY WITH PROPOFOL ;  Surgeon: Janalyn Keene NOVAK, MD;  Location: [facility] ENDOSCOPY;  Service: Endoscopy;  Laterality: N/A;   discectomy N/A 2017, 2018, 2019   x3  L5-S1   ELBOW SURGERY     right elbow   ESOPHAGOGASTRODUODENOSCOPY (EGD) WITH PROPOFOL  N/A 08/15/2020   Procedure: ESOPHAGOGASTRODUODENOSCOPY (EGD) WITH PROPOFOL ;  Surgeon: Jinny Carmine, MD;  Location: [facility] ENDOSCOPY;  Service: Endoscopy;  Laterality: N/A;   Patient Active Problem List   Diagnosis Date Noted   Cervicalgia 06/28/2024   Lumbar radiculopathy 06/28/2024   Diverticulitis 12/02/2023   Inattention 11/24/2023   Stricture and stenosis of esophagus    Gastroesophageal reflux disease 07/26/2020   Nondiabetic gastroparesis 05/22/2020    Class: History of   History of marijuana use 05/18/2020   Pain medication agreement broken 05/18/2020   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) 05/01/2019   Rectal polyp    Hypogonadism male 09/28/2018   Rheumatoid arthritis (HCC) 09/28/2018   Carpal  tunnel syndrome 09/28/2018   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/2016   Osteoarthritis of hip (Right) 01/22/2016   Chronic low back pain (Primary Area of Pain) (Bilateral) (R>L) 12/18/2015   Erectile dysfunction 12/18/2015   Lumbar facet hypertrophy (Multilevel) 06/11/2015   Lumbosacral radiculopathy at S1 (right-sided) 06/11/2015   Lumbar spondylosis 06/03/2015   Avitaminosis D 06/03/2015   Class 2 severe obesity due to excess calories with serious comorbidity and body mass index (BMI) of 38.0 to 38.9 in adult 06/03/2015   DDD (degenerative disc disease), lumbar 06/03/2015   L4-L5 disc bulge 06/03/2015   Ligamentum flavum hypertrophy 06/03/2015   OSA (obstructive sleep apnea) 06/03/2015   Mixed dyslipidemia 03/01/2015   Essential hypertension 07/20/2014   Hypothyroidism 07/20/2014   Adrenal insufficiency 07/20/2014    PCP: Franchot Isaiah LABOR, MD   REFERRING PROVIDER: Clois Fret, MD   REFERRING DIAG:  M54.2 (ICD-10-CM) - Cervicalgia  M54.12 (ICD-10-CM) - Cervical radiculopathy  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  Pt reports 3/10 pain upon arrival.  Eval: Pt reports that he had 3 discectomies in 3 years back in 2017-2019 in the lumbar spine.  Pt reports that he still has reduced sensation in the R LE and never  fully recovered from it.    Pt is now having cervical pain at this point in time, and he feels as though this is the most pressing issue at the moment.  Pt notes that the MRI shows the complications to be more on the R side, however the pt is reporting significant pain and radicular symptoms in the L UE.  Pt notes that there is a hard mass in the cervical region as well and feels that it is complicating issues.  [facility] in [place].   Hand dominance: Right  PERTINENT HISTORY:   Per MD: [name] is here today with a chief complaint of chronic neck and back pain who presents with worsening neck pain and left arm symptoms.   He has had neck problems for about ten years with worsening symptoms over the past few months.   He now has neck pain radiating into the left arm with numbness and tingling into the fourth and fifth fingers. Coughing or sneezing worsens the pain, which can travel up the arm. He notes tightness around the left shoulder blade and muscle tension. Chiropractic treatment for three weeks (without spinal manipulation) has reduced muscle knots but has not relieved the radicular symptoms.   He has had three prior back surgeries, most recently in late 2019 or early 2020 in [place], with persistent right leg numbness since that time. He also reports intermittent numbness in the groin that has improved with chiropractic care. He is on full disability for spine and other medical issues.  PAIN:  Are you having pain? Yes: NPRS scale: 6/10 current; 7/10 at worst Pain location: cervical pain Pain description: throbbing, stabbing Aggravating factors: sitting Relieving factors: raising head and   PRECAUTIONS: None  RED FLAGS: Cauda equina syndrome: Yes: Pt reports that he feels numbness during intercourse and when sitting for prolonged period of time.  Pt notes that he is also not feeling the flow  during urination.     WEIGHT BEARING RESTRICTIONS: No  FALLS:  Has patient fallen in last 6 months? No  LIVING ENVIRONMENT: Lives with: lives with their family Lives in: House/apartment Stairs: Yes: Internal: 15 steps; on right going up and External: 3 steps; on right going up, on left going up, and bilateral but cannot reach both Has following equipment at home: None  OCCUPATION: On disability.  PLOF: Independent  PATIENT GOALS: Pt   NEXT MD VISIT: 09/13/24  OBJECTIVE:  Note: Objective measures were completed at Evaluation unless otherwise noted.  DIAGNOSTIC FINDINGS:   EXAM: MRI CERVICAL SPINE WITHOUT CONTRAST 07/05/2024 04:34:08 PM  IMPRESSION: 1. Broad-based disc osteophyte complex at C5-6 causing moderate canal stenosis to 7 mm with ventral cord contour distortion and severe foraminal narrowing, worse on the right. 2. Moderate-to-severe right and moderate left foraminal stenosis at C4-5 due to rightward disc osteophyte complex. 3. Moderate right foraminal stenosis at C3-4 due to rightward disc osteophyte complex. 4. Moderate foraminal narrowing, worse on the right, at C6-7 due to broad-based disc osteophyte complex. 5. Mild right foraminal stenosis at C2-3 due to rightward disc osteophyte complex. 6. Slight retrolisthesis  at C5-6 and C6-7.  PATIENT SURVEYS:  Oswestry Low Back Pain Disability Questionnaire:  OSWESTRY LBP DISABILITY Questionnaire  Date: 08/30/2024 Score  Pain intensity 3 The pain is fairly severe at the moment.  2. Personal care (washing, dressing, etc.) 3 I need some help but manage most of my  personal care.  3. Lifting 2 Pain prevents me from lifting heavy weights off  the floor, but I can manage if they are conveniently  placed, eg. on a table.  4. Walking 3 Pain prevents me from walking more than 100 yards.   5. Sitting 2 Pain prevents me sitting more than one hour.   6. Standing 4 Pain prevents me from standing for more than  10 minutes.   7. Sleeping 3 Because of pain I have less than 4 hours sleep.   8. Sex life (if applicable) 3 My sex life is severely restricted by pain.   9. Social life 3 Pain has restricted my social life and I do not go out  as often.  10. Traveling 3 Pain restricts me to journeys of less than one hour.   Total 29/50; 58%   Interpretation of scores: Score Category Description  0-20% Minimal Disability The patient can cope with most living activities. Usually no treatment is indicated apart from advice on lifting, sitting and exercise  21-40% Moderate Disability The patient experiences more pain and difficulty with sitting, lifting and standing. Travel and social life are more difficult and they may be disabled from work. Personal care, sexual activity and sleeping are not grossly affected, and the patient can usually be managed by conservative means  41-60% Severe Disability Pain remains the main problem in this group, but activities of daily living are affected. These patients require a detailed investigation  61-80% Crippled Back pain impinges on all aspects of the patients life. Positive intervention is required  81-100% Bed-bound These patients are either bed-bound or exaggerating their symptoms  Reference: Adelle SPEAK, Pynsent PB. The Oswestry Disability Index. Spine 2000 Nov 15;25(22):2940-52; discussion 107.  Minimum detectable change (90% confidence): 10% points  NDI:  NECK DISABILITY INDEX  Date: 08/30/2024 Score  Pain intensity 3 = The pain is fairly severe at the moment  2. Personal care (washing, dressing, etc.) 3 =  I need some help but can manage most of my personal care  3. Lifting 2 = Pain prevents me lifting heavy weights off the floor, but I can manage if they are  conveniently placed, for example on a table  4. Reading 4 =  I can hardly read at all because of severe pain in my neck  5. Headaches 0 = I have no headaches at all  6. Concentration 1 =  I can concentrate fully when I want  to with slight difficulty   7. Work 4 = I can hardly do any work at all  8. Driving 3 = I can't drive my car as long as I want because of moderate pain in my neck  9. Sleeping 1 = My sleep is slightly disturbed (less than 1 hr sleepless)  10. Recreation 4 =  I can hardly do any recreation activities because of pain in my neck  Total 25/50   Minimum Detectable Change (90% confidence): 5 points or 10% points  COGNITION: Overall cognitive status: Within functional limits for tasks assessed  SENSATION: WFL  POSTURE: rounded shoulders and forward head  PALPATION: Pt has a palpable mass on the L side of the transverse  process of the C7-T1 region of the neck.   CERVICAL ROM:   Active ROM A/PROM (deg) eval  Flexion 50  Extension 43  Right lateral flexion 34  Left lateral flexion 35  Right rotation 52  Left rotation 43   (Blank rows = not tested)  UPPER EXTREMITY ROM:  WNL for flexion and abduction bilaterally  UPPER EXTREMITY MMT:  MMT Right eval Left eval  Shoulder flexion 5 5  Shoulder abduction 5 5   (Blank rows = not tested)  CERVICAL SPECIAL TESTS:  Not performed at this time due to time restratints.    TREATMENT DATE: 09/07/2024   TherEx:  Seated scapular retractions, 35#, 2x10 Standing high row at OMEGA, 35#, 2x10 Standing pallof press, 15#, 2x10 each side  Initially started with 35#, however pt noted to have lower back symptomatic response into the R LE, so lighter weight was utilized    Manual:  Supine STM to cervical region to increase extensibility of the paraspinals Supine suboccipital release technique to decrease cervicalgia Supine manual traction performed in order to increase joint space in cervical region for pain relief Supine cervical upglides/downglides, 30 sec bouts Supine UT/Levator stretch, 30 sec bouts to increase tissue extensibility of the cervical region  Prone STM to the thoracic and lumbar paraspinals, for pain modulation and  increased tissue extensibility Prone unilateral/cental PA's, grades I-II, for pain modulation of the lumbar and lower thoracic region.  Pt noted to have stinging discomfort with the T10-L2 and L5 spinal segments   PATIENT EDUCATION:  Education details: Pt educated on role of PT and services provided during current POC, along with prognosis and information about the clinic.  Person educated: Patient Education method: Explanation Education comprehension: verbalized understanding  HOME EXERCISE PROGRAM:  Access Code: [email or web address] URL: [email or web address] Date: 09/05/2024 Prepared by: Sidra Simpers  Exercises - Seated Scapular Retraction  - 1 x daily - 7 x weekly - 3 sets - 10 reps - Seated Cervical Retraction  - 1 x daily - 7 x weekly - 3 sets - 10 reps - Seated Levator Scapulae Stretch  - 1 x daily - 7 x weekly - 1 sets - 2 reps - 30 hold - Seated Upper Trapezius Stretch  - 2 x daily - 7 x weekly - 1 sets - 2 reps - 30 hold - Seated Assisted Cervical Rotation with Towel  - 1 x daily - 7 x weekly - 3 sets - 10 reps - Cervical Extension AROM with Strap  - 1 x daily - 7 x weekly - 3 sets - 10 reps  ASSESSMENT:  CLINICAL IMPRESSION:  Patient responded well to the manual therapy approach.  Patient noted a reduction in overall symptom response and felt that the pain was diminished and instead started to feel more like soreness rather than pain.  Patient did note some increased pain as noted above the paloff press.  Once this was found patient was given lighter weights and switch sides before continuing on the same side.  Patient noted the pain to be more of a stinging pain and radicular in nature while performing exercise.  Will continue to monitor this symptomatic response at future sessions and may be performed at a lighter weight.  Pt will continue to benefit from skilled therapy to address remaining deficits in order to improve overall QoL and return to PLOF.     Note:  Portions of this document were prepared using Dragon voice recognition software and although reviewed may contain unintentional dictation  errors in syntax, grammar, or spelling.    OBJECTIVE IMPAIRMENTS: decreased activity tolerance, decreased knowledge of condition, decreased mobility, difficulty walking, decreased ROM, hypomobility, impaired UE functional use, postural dysfunction, obesity, and pain.   ACTIVITY LIMITATIONS: carrying, lifting, bending, sitting, standing, squatting, continence, and locomotion level  PARTICIPATION LIMITATIONS: meal prep, cleaning, laundry, interpersonal relationship, driving, shopping, community activity, and yard work  PERSONAL FACTORS: Past/current experiences, Time since onset of injury/illness/exacerbation, and 3+ comorbidities: prior discectomy, anxiety, HTN, chronic back pain, HTN, sleep apnea are also affecting patient's functional outcome.   REHAB POTENTIAL: Fair ongoing issue in which surgicl intervention has not assisted in overall pain and recovery.  CLINICAL DECISION MAKING: Evolving/moderate complexity  EVALUATION COMPLEXITY: Moderate   GOALS: Goals reviewed with patient? Yes  SHORT TERM GOALS: Target date: 09/27/2024  Pt will be independent with HEP in order to demonstrate increased ability to perform tasks related to occupation/hobbies. Baseline: To be given at next visit Goal status: INITIAL  LONG TERM GOALS: Target date: 10/25/2024  Pt will improve the Oswestry Disability Questionnaire to be </=14 to demonstrate increased functional ability to perform tasks of daily living. Baseline: 29/50 Goal status: INITIAL  2.  Pt will demonstrate decrease in NDI by at least 19% in order to demonstrate clinically significant reduction in disability related to neck injury/pain. Baseline: 50% Goal status: INITIAL  3.  Pt will reduce overall pain level to 2/10 by utilizing a combination of stretching, strengthening exercises, and pain-reducing  modalities in order to improve overall QoL. Baseline: 6/10 currently Goal status: INITIAL  PLAN:  PT FREQUENCY: 2x/week  PT DURATION: 8 weeks  PLANNED INTERVENTIONS: 97750- Physical Performance Testing, 97110-Therapeutic exercises, 97530- Therapeutic activity, 97112- Neuromuscular re-education, 97535- Self Care, [id number]- Manual therapy, 20560 (1-2 muscles), 20561 (3+ muscles)- Dry Needling, Patient/Family education, Joint mobilization, Spinal mobilization, Cryotherapy, and Moist heat  PLAN FOR NEXT SESSION:   Generate HEP, STM to the cervical region avoiding the mass around the C7-T1 L side of the transverse process,  stretches to the UT's    Fonda Simpers, PT, DPT Physical Therapist - [facility]  09/07/24, 4:54 PM  "

## 2024-09-11 ENCOUNTER — Ambulatory Visit

## 2024-09-13 ENCOUNTER — Ambulatory Visit

## 2024-09-13 ENCOUNTER — Encounter: Payer: Self-pay | Admitting: Pain Medicine

## 2024-09-13 ENCOUNTER — Ambulatory Visit
Admission: RE | Admit: 2024-09-13 | Discharge: 2024-09-13 | Disposition: A | Source: Ambulatory Visit | Attending: Pain Medicine | Admitting: Pain Medicine

## 2024-09-13 ENCOUNTER — Ambulatory Visit: Attending: Pain Medicine | Admitting: Pain Medicine

## 2024-09-13 ENCOUNTER — Ambulatory Visit
Admission: RE | Admit: 2024-09-13 | Discharge: 2024-09-13 | Disposition: A | Discharge status: Home / Self Care | Attending: Pain Medicine | Admitting: Pain Medicine

## 2024-09-13 VITALS — BP 182/110 | HR 60 | Temp 98.9°F | Resp 16 | Ht 74.0 in | Wt 290.0 lb

## 2024-09-13 DIAGNOSIS — M79602 Pain in left arm: Secondary | ICD-10-CM | POA: Insufficient documentation

## 2024-09-13 DIAGNOSIS — M5090 Cervical disc disorder, unspecified, unspecified cervical region: Secondary | ICD-10-CM | POA: Insufficient documentation

## 2024-09-13 DIAGNOSIS — G8929 Other chronic pain: Secondary | ICD-10-CM

## 2024-09-13 DIAGNOSIS — M5459 Other low back pain: Secondary | ICD-10-CM

## 2024-09-13 DIAGNOSIS — Z789 Other specified health status: Secondary | ICD-10-CM | POA: Insufficient documentation

## 2024-09-13 DIAGNOSIS — R29898 Other symptoms and signs involving the musculoskeletal system: Secondary | ICD-10-CM

## 2024-09-13 DIAGNOSIS — M5412 Radiculopathy, cervical region: Secondary | ICD-10-CM | POA: Insufficient documentation

## 2024-09-13 DIAGNOSIS — M899 Disorder of bone, unspecified: Secondary | ICD-10-CM | POA: Insufficient documentation

## 2024-09-13 DIAGNOSIS — M542 Cervicalgia: Secondary | ICD-10-CM

## 2024-09-13 DIAGNOSIS — M5386 Other specified dorsopathies, lumbar region: Secondary | ICD-10-CM

## 2024-09-13 DIAGNOSIS — G894 Chronic pain syndrome: Secondary | ICD-10-CM | POA: Insufficient documentation

## 2024-09-13 DIAGNOSIS — Z79899 Other long term (current) drug therapy: Secondary | ICD-10-CM | POA: Insufficient documentation

## 2024-09-13 NOTE — Patient Instructions (Addendum)
 " ______________________________________________________________________    Procedure instructions  Stop blood-thinners  Do not eat or drink fluids (other than water) for 8 hours before your procedure  No water for 2 hours before your procedure  Take your blood pressure medicine with a sip of water  Arrive 30 minutes before your appointment  If sedation is planned, bring suitable driver. Nada, [place]le, & public transportation are NOT APPROVED)  Carefully read the Preparing for your procedure detailed instructions  If you have questions call us  at [phone]  Procedure appointments are for procedures only.   NO medication refills or new problem evaluations will be done on procedure days.   Only the scheduled, pre-approved procedure and side will be done.   ______________________________________________________________________     ______________________________________________________________________    Preparing for your procedure  Appointments: If you think you may not be able to keep your appointment, call 24-48 hours in advance to cancel. We need time to make it available to others.  Procedure visits are for procedures only. During your procedure appointment there will be: NO Prescription Refills*. NO medication changes or discussions*. NO discussion of disability issues*. NO unrelated pain problem evaluations*. NO evaluations to order other pain procedures*. *These will be addressed at a separate and distinct evaluation encounter on the provider's evaluation schedule and not during procedure days.  Instructions: Food intake: Avoid eating anything solid for at least 8 hours prior to your procedure. Clear liquid intake: You may take clear liquids such as water up to 2 hours prior to your procedure. (No carbonated drinks. No soda.) Transportation: Unless otherwise stated by your physician, bring a driver. (Driver cannot be a Market Researcher, Pharmacist, Community, or any other form of public  transportation.) Morning Medicines: Except for blood thinners, take all of your other morning medications with a sip of water. Make sure to take your heart and blood pressure medicines. If your blood pressure's lower number is above 100, the case will be rescheduled. Blood thinners: Make sure to stop your blood thinners as instructed.  If you take a blood thinner, but were not instructed to stop it, call our office [phone] and ask to talk to a nurse. Not stopping a blood thinner prior to certain procedures could lead to serious complications. Diabetics on insulin: Notify the staff so that you can be scheduled 1st case in the morning. If your diabetes requires high dose insulin, take only  of your normal insulin dose the morning of the procedure and notify the staff that you have done so. Preventing infections: Shower with an antibacterial soap the morning of your procedure.  Build-up your immune system: Take 1000 mg of Vitamin C with every meal (3 times a day) the day prior to your procedure. Antibiotics: Inform the nursing staff if you are taking any antibiotics or if you have any conditions that may require antibiotics prior to procedures. (Example: recent joint implants)   Pregnancy: If you are pregnant make sure to notify the nursing staff. Not doing so may result in injury to the fetus, including death.  Sickness: If you have a cold, fever, or any active infections, call and cancel or reschedule your procedure. Receiving steroids while having an infection may result in complications. Arrival: You must be in the facility at least 30 minutes prior to your scheduled procedure. Tardiness: Your scheduled time is also the cutoff time. If you do not arrive at least 15 minutes prior to your procedure, you will be rescheduled.  Children: Do not bring any children  with you. Make arrangements to keep them home. Dress appropriately: There is always a possibility that your clothing may get soiled. Avoid  long dresses. Valuables: Do not bring any jewelry or valuables.  Reasons to call and reschedule or cancel your procedure: (Following these recommendations will minimize the risk of a serious complication.) Surgeries: Avoid having procedures within 2 weeks of any surgery. (Avoid for 2 weeks before or after any surgery). Flu Shots: Avoid having procedures within 2 weeks of a flu shots or . (Avoid for 2 weeks before or after immunizations). Barium: Avoid having a procedure within 7-10 days after having had a radiological study involving the use of radiological contrast. (Myelograms, Barium swallow or enema study). Heart attacks: Avoid any elective procedures or surgeries for the initial 6 months after a Myocardial Infarction (Heart Attack). Blood thinners: It is imperative that you stop these medications before procedures. Let us  know if you if you take any blood thinner.  Infection: Avoid procedures during or within two weeks of an infection (including chest colds or gastrointestinal problems). Symptoms associated with infections include: Localized redness, fever, chills, night sweats or profuse sweating, burning sensation when voiding, cough, congestion, stuffiness, runny nose, sore throat, diarrhea, nausea, vomiting, cold or Flu symptoms, recent or current infections. It is specially important if the infection is over the area that we intend to treat. Heart and lung problems: Symptoms that may suggest an active cardiopulmonary problem include: cough, chest pain, breathing difficulties or shortness of breath, dizziness, ankle swelling, uncontrolled high or unusually low blood pressure, and/or palpitations. If you are experiencing any of these symptoms, cancel your procedure and contact your primary care physician for an evaluation.  Remember:  Regular Business hours are:  Monday to Thursday 8:00 AM to 4:00 PM  Provider's Schedule: Eric Como, MD:  Procedure days: Tuesday and Thursday 7:30  AM to 4:00 PM  Wallie Sherry, MD:  Procedure days: Monday and Wednesday 7:30 AM to 4:00 PM Last  Updated: 07/27/2023 ______________________________________________________________________     ______________________________________________________________________    General Risks and Possible Complications  Patient Responsibilities: It is important that you read this as it is part of your informed consent. It is our duty to inform you of the risks and possible complications associated with treatments offered to you. It is your responsibility as a patient to read this and to ask questions about anything that is not clear or that you believe was not covered in this document.  Patients Rights: You have the right to refuse treatment. You also have the right to change your mind, even after initially having agreed to have the treatment done. However, under this last option, if you wait until the last second to change your mind, you may be charged for the materials used up to that point.  Introduction: Medicine is not an visual merchandiser. Everything in Medicine, including the lack of treatment(s), carries the potential for danger, harm, or loss (which is by definition: Risk). In Medicine, a complication is a secondary problem, condition, or disease that can aggravate an already existing one. All treatments carry the risk of possible complications. The fact that a side effects or complications occurs, does not imply that the treatment was conducted incorrectly. It must be clearly understood that these can happen even when everything is done following the highest safety standards.  No treatment: You can choose not to proceed with the proposed treatment alternative. The PRO(s) would include: avoiding the risk of complications associated with the therapy. The CON(s) would  include: not getting any of the treatment benefits. These benefits fall under one of three categories: diagnostic; therapeutic; and/or  palliative. Diagnostic benefits include: getting information which can ultimately lead to improvement of the disease or symptom(s). Therapeutic benefits are those associated with the successful treatment of the disease. Finally, palliative benefits are those related to the decrease of the primary symptoms, without necessarily curing the condition (example: decreasing the pain from a flare-up of a chronic condition, such as incurable terminal cancer).  General Risks and Complications: These are associated to most interventional treatments. They can occur alone, or in combination. They fall under one of the following six (6) categories: no benefit or worsening of symptoms; bleeding; infection; nerve damage; allergic reactions; and/or death. No benefits or worsening of symptoms: In Medicine there are no guarantees, only probabilities. No healthcare provider can ever guarantee that a medical treatment will work, they can only state the probability that it may. Furthermore, there is always the possibility that the condition may worsen, either directly, or indirectly, as a consequence of the treatment. Bleeding: This is more common if the patient is taking a blood thinner, either prescription or over the counter (example: Goody Powders, Fish oil, Aspirin, Garlic, etc.), or if suffering a condition associated with impaired coagulation (example: Hemophilia, cirrhosis of the liver, low platelet counts, etc.). However, even if you do not have one on these, it can still happen. If you have any of these conditions, or take one of these drugs, make sure to notify your treating physician. Infection: This is more common in patients with a compromised immune system, either due to disease (example: diabetes, cancer, human immunodeficiency virus [HIV], etc.), or due to medications or treatments (example: therapies used to treat cancer and rheumatological diseases). However, even if you do not have one on these, it can still  happen. If you have any of these conditions, or take one of these drugs, make sure to notify your treating physician. Nerve Damage: This is more common when the treatment is an invasive one, but it can also happen with the use of medications, such as those used in the treatment of cancer. The damage can occur to small secondary nerves, or to large primary ones, such as those in the spinal cord and brain. This damage may be temporary or permanent and it may lead to impairments that can range from temporary numbness to permanent paralysis and/or brain death. Allergic Reactions: Any time a substance or material comes in contact with our body, there is the possibility of an allergic reaction. These can range from a mild skin rash (contact dermatitis) to a severe systemic reaction (anaphylactic reaction), which can result in death. Death: In general, any medical intervention can result in death, most of the time due to an unforeseen complication. ______________________________________________________________________      ______________________________________________________________________    Steroid injections  Common steroids for injections Triamcinolone : Used by many sports medicine physicians for large joint and bursal injections, often combined with a local anesthetic like lidocaine . A study focusing on coccydynia (tailbone pain) found triamcinolone  was more effective than betamethasone , suggesting it may also be preferable for other localized inflammation conditions. Methylprednisolone : A common alternative to triamcinolone  that is also a strong anti-inflammatory. It is available in different formulations, with the acetate suspension being the long-acting option for intra-articular injections. Dexamethasone: This is a non-particulate steroid, meaning it has a lower risk of tissue damage compared to particulate steroids like triamcinolone  and methylprednisolone . While less common for this specific  use, it is an option for targeted injections.   Considerations for physicians Particulate vs. non-particulate steroids: Triamcinolone  and methylprednisolone  are particulate, meaning they can clump together. Dexamethasone is non-particulate. Particulate steroids are often preferred for their longer-lasting effects but carry a theoretical higher risk for certain injections (though this is less of a concern in the costochondral joints). Combined injectate: Corticosteroids are typically mixed with a local anesthetic like lidocaine  to provide both immediate pain relief (from the anesthetic) and longer-term inflammation reduction (from the steroid). Imaging guidance: To ensure accurate placement of the needle and medication, physicians may use ultrasound or fluoroscopic guidance for the injection, especially in complex or refractory cases.   Patient guidance Before undergoing a steroid injection, discuss the options with your physician. They will determine the best steroid, dosage, and procedure for your specific case based on factors like: Severity of your condition History of response to other treatments Your overall health status Experience and preference of the physician  Last  Updated: 04/11/2024 ______________________________________________________________________     ______________________________________________________________________    New Patients  Welcome to [facility] [place] [facility].   Initial Visit The first or initial visit consists of an evaluation only.   Interventional pain management.  This is an interventional pain management medical practice. This means that we offer therapies other than prescription controlled substances to manage chronic pain. These therapies may include, but are not limited to, diagnostic, therapeutic, and palliative specialized injection therapies (i.e.: Epidural Steroids, Facet Blocks, etc.). We  specialize in a variety of nerve blocks as well as radiofrequency treatments. We offer pain implant evaluations and trials, as well as follow up management. In addition we also provide a variety joint injections, including Viscosupplementation (AKA: Gel Therapy).  Prescription Pain Medication. We specialize in alternatives to opioids.  Although we can provide evaluations and recommendations for/of pharmacologic therapies based on CDC Guidelines, we no longer take patients for long-term medication management. Please be clear that we will not be taking over the prescribing of your pain medications.  ______________________________________________________________________      ______________________________________________________________________    Patient Information update  To: All of our patients.  Re: Name change.  It has been made official that our current name, [facility]   will soon be changed to [facility].   The purpose of this change is to eliminate any confusion created by the concept of our practice being a Medication Management Pain Clinic. In the past this has led to the misconception that we treat pain primarily by the use of prescription medications.  Nothing can be farther from the truth.   Understanding PAIN MANAGEMENT: To further understand what our practice does, you first have to understand that Pain Management is a subspecialty that requires additional training once a physician has completed their specialty training, which can be in either Anesthesia, Neurology, Psychiatry, or Physical Medicine and Rehabilitation (PMR). Each one of these contributes to the final approach taken by each physician to the management of their patient's pain. To be a Pain Management Specialist you must have first completed one of the specialty trainings below.  Anesthesiologists  - trained in clinical pharmacology and interventional techniques such as nerve blockade and regional as well as central neuroanatomy. They are trained to block pain before, during, and after surgical interventions.  Neurologists - trained in the diagnosis and pharmacological treatment of complex neurological conditions, such as Multiple Sclerosis, Parkinson's, spinal  cord injuries, and other systemic conditions that may be associated with symptoms that may include but are not limited to pain. They tend to rely primarily on the treatment of chronic pain using prescription medications.  Psychiatrist - trained in conditions affecting the psychosocial wellbeing of patients including but not limited to depression, anxiety, schizophrenia, personality disorders, addiction, and other substance use disorders that may be associated with chronic pain. They tend to rely primarily on the treatment of chronic pain using prescription medications.   Physical Medicine and Rehabilitation (PMR) physicians, also known as physiatrists - trained to treat a wide variety of medical conditions affecting the brain, spinal cord, nerves, bones, joints, ligaments, muscles, and tendons. Their training is primarily aimed at treating patients that have suffered injuries that have caused severe physical impairment. Their training is primarily aimed at the physical therapy and rehabilitation of those patients. They may also work alongside orthopedic surgeons or neurosurgeons using their expertise in assisting surgical patients to recover after their surgeries.  INTERVENTIONAL PAIN MANAGEMENT is sub-subspecialty of Pain Management.  Our physicians are Board-certified in Anesthesia, Pain Management, and Interventional Pain Management.  This meaning that not only have they been trained and Board-certified in their specialty of Anesthesia, and subspecialty of Pain Management, but they have also received further training in the sub-subspecialty  of Interventional Pain Management, in order to become Board-certified as INTERVENTIONAL PAIN MANAGEMENT SPECIALIST.    Mission: Our goal is to use our skills in  INTERVENTIONAL PAIN MANAGEMENT as alternatives to the chronic use of prescription opioid medications for the treatment of pain. To make this more clear, we have changed our name to reflect what we do and offer. We will continue to offer medication management assessment and recommendations, but we will not be taking over any patient's medication management.  ______________________________________________________________________       ______________________________________________________________________    Appointment Information  It is our goal and responsibility to provide the medical community with assistance in the evaluation and management of patients with chronic pain. Unfortunately our resources are limited. Because we do not have an unlimited amount of time, or available appointments, we are required to closely monitor for unkept or cancelled appointments.  Patient's responsibilities: 1. Punctuality: Patients are required to be physically present in our office at least 15 minutes before their scheduled appointment. 2. Tardiness: Patients not physically present in our office at their scheduled appointment time will be rescheduled. 3. Plan ahead: Assume that you will encounter traffic and plan to arrive 30 minutes before your appointment. 4. Other appointments and responsibilities: Do not schedule other appointments immediately before or after your scheduled appointment.  5. Be prepared: Make a list of everything that you need to discuss with your provider so that you use your time efficiently. Once the provider leaves your room, he/she will not return to your room to discuss anything that you neglected to bring up during your allowed time. 6. No children or pets: Do not bring children or pets to your appointment. 7. Cancelling or  rescheduling your appointment: Advanced notification (more than 24 hours in advance) is required. 8. No Show: Not calling to cancel an appointment and simply not showing up is unacceptable. This leads to loss of appointments that could have been used by a patient in need. (See below)  Corrective process for repeat offenders:  No Shows: Three (3) No Shows within a 12 month period will result in an automatic discharge from our practice. Rescheduling or cancelling with more than 24  hours notice will not be penalized and will not count against you. Tardiness: If you have to be rescheduled three (3) times due to late arrivals, it will be counted as one (1) No Show. Cancellation or reschedule: Three (3) cancellations or rescheduling where notice was given with less than 24 hours in advance, will be recorded as one (1) No Show.  Types of appointments: New patient initial evaluation: These are evaluations only. Your initial patient questionnaire will be collected and entered into the system. A history of present illness will be taken. Prior lab work, imaging studies, and associated treatments will be reviewed. The provider may order appropriate diagnostic testing depending on their evaluation and review of available information. No treatments will be started on this visit. 2nd Follow-up visit: During this visit your provider will inform you of the results of the diagnostic tests ordered on the initial evaluation. Based on the providers assessment, treatment options will be offered, at which the patient will decide if he/she is interested in the alternatives. If interested, a treatment plan will be established and started. Procedure visits: Post-procedure evaluation visits: Evaluation visits MM New problems Flare-up evaluations Follow-up after diagnostic testing ______________________________________________________________________     "

## 2024-09-13 NOTE — Progress Notes (Signed)
 Safety precautions to be maintained throughout the outpatient stay will include: orient to surroundings, keep bed in low position, maintain call bell within reach at all times, provide assistance with transfer out of bed and ambulation.

## 2024-09-13 NOTE — Therapy (Signed)
 " OUTPATIENT PHYSICAL THERAPY CERVICAL/LUMBAR TREATMENT   Patient Name: Dean Ford MRN: [id number] DOB:09-15-1975, 49 y.o., male Today's Date: 09/13/2024  END OF SESSION:  PT End of Session - 09/13/24 0818     Visit Number 4    Number of Visits 17    Date for Recertification  10/25/24    PT Start Time 0818    PT Stop Time 0900    PT Time Calculation (min) 42 min    Activity Tolerance Patient tolerated treatment well          Past Medical History:  Diagnosis Date   Acute lumbar radiculopathy (Right) (S1) 05/26/2016   Acute myofascial pain 05/04/2017   Acute postoperative pain 06/22/2019   Adrenal insufficiency    Anxiety    Anxiety disorder 01/28/2015   Arthritis    Back pain, chronic 06/03/2015   Hypertension    Hypothyroid    Sleep apnea    Thyroid  disease    Past Surgical History:  Procedure Laterality Date   BACK SURGERY     COLONOSCOPY WITH PROPOFOL  N/A 02/23/2019   Procedure: COLONOSCOPY WITH PROPOFOL ;  Surgeon: Janalyn Keene NOVAK, MD;  Location: [facility] END[facility]COPY;  Service: Endoscopy;  Laterality: N/A;   discectomy N/A 2017, 2018, 2019   x3  L5-S1   ELBOW SURGERY     right elbow   ESOPHAGOGASTRODUODENOSCOPY (EGD) WITH PROPOFOL  N/A 08/15/2020   Procedure: ESOPHAGOGASTRODUODENOSCOPY (EGD) WITH PROPOFOL ;  Surgeon: Jinny Carmine, MD;  Location: [facility] ENDOSCOPY;  Service: Endoscopy;  Laterality: N/A;   Patient Active Problem List   Diagnosis Date Noted   Cervicalgia 06/28/2024   Lumbar radiculopathy 06/28/2024   Diverticulitis 12/02/2023   Inattention 11/24/2023   Stricture and stenosis of esophagus    Gastroesophageal reflux disease 07/26/2020   Nondiabetic gastroparesis 05/22/2020    Class: History of   History of marijuana use 05/18/2020   Pain medication agreement broken 05/18/2020   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) 05/01/2019   Rectal polyp    Pharmacologic therapy 02/09/2019   Disorder of skeletal system 02/09/2019    Problems influencing health status 02/09/2019   Hypogonadism male 09/28/2018   Rheumatoid arthritis (HCC) 09/28/2018   Carpal tunnel syndrome 09/28/2018   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/2016   Osteoarthritis of hip (Right) 01/22/2016   Chronic low back pain (Primary Area of Pain) (Bilateral) (R>L) 12/18/2015   Erectile dysfunction 12/18/2015   Chronic pain syndrome 09/02/2015   Lumbar facet hypertrophy (Multilevel) 06/11/2015   Lumbosacral radiculopathy at S1 (right-sided) 06/11/2015   Lumbar spondylosis 06/03/2015   Avitaminosis D 06/03/2015   Class 2 severe obesity due to excess calories with serious comorbidity and body mass index (BMI) of 38.0 to 38.9 in adult 06/03/2015   DDD (degenerative disc disease), lumbar 06/03/2015   L4-L5 disc bulge 06/03/2015   Ligamentum flavum hypertrophy 06/03/2015   OSA (obstructive sleep apnea) 06/03/2015   Mixed dyslipidemia 03/01/2015   Essential hypertension 07/20/2014   Hypothyroidism 07/20/2014   Adrenal insufficiency 07/20/2014    PCP: Franchot Isaiah LABOR, MD   REFERRING PROVIDER: Clois Fret, MD   REFERRING DIAG:  M54.2 (ICD-10-CM) - Cervicalgia  M54.12 (ICD-10-CM) - Cervical radiculopathy  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  Pt noted 4-5/10 in the lumbar region as well.    Eval: Pt  reports that he had 3 discectomies in 3 years back in 2017-2019 in the lumbar spine.  Pt reports that he still has reduced sensation in the R LE and never fully recovered from it.    Pt is now having cervical pain at this point in time, and he feels as though this is the most pressing issue at the moment.  Pt notes that the MRI shows the complications to be more on the R side, however the pt is reporting significant pain and radicular symptoms in the L UE.  Pt notes that there is a hard mass in the cervical region as well and feels that it is complicating issues.  [facility] in [place].   Hand dominance: Right  PERTINENT HISTORY:   Per MD: [name] is here today with a chief complaint of chronic neck and back pain who presents with worsening neck pain and left arm symptoms.   He has had neck problems for about ten years with worsening symptoms over the past few months.   He now has neck pain radiating into the left arm with numbness and tingling into the fourth and fifth fingers. Coughing or sneezing worsens the pain, which can travel up the arm. He notes tightness around the left shoulder blade and muscle tension. Chiropractic treatment for three weeks (without spinal manipulation) has reduced muscle knots but has not relieved the radicular symptoms.   He has had three prior back surgeries, most recently in late 2019 or early 2020 in [place], with persistent right leg numbness since that time. He also reports intermittent numbness in the groin that has improved with chiropractic care. He is on full disability for spine and other medical issues.  PAIN:  Are you having pain? Yes: NPRS scale: 6/10 current; 7/10 at worst Pain location: cervical pain Pain description: throbbing, stabbing Aggravating factors: sitting Relieving factors: raising head and   PRECAUTIONS: None  RED FLAGS: Cauda equina syndrome:  Yes: Pt reports that he feels numbness during intercourse and when sitting for  prolonged period of time.  Pt notes that he is also not feeling the flow during urination.     WEIGHT BEARING RESTRICTIONS: No  FALLS:  Has patient fallen in last 6 months? No  LIVING ENVIRONMENT: Lives with: lives with their family Lives in: House/apartment Stairs: Yes: Internal: 15 steps; on right going up and External: 3 steps; on right going up, on left going up, and bilateral but cannot reach both Has following equipment at home: None  OCCUPATION: On disability.  PLOF: Independent  PATIENT GOALS: Pt   NEXT MD VISIT: 09/13/24  OBJECTIVE:  Note: Objective measures were completed at Evaluation unless otherwise noted.  DIAGNOSTIC FINDINGS:   EXAM: MRI CERVICAL SPINE WITHOUT CONTRAST 07/05/2024 04:34:08 PM  IMPRESSION: 1. Broad-based disc osteophyte complex at C5-6 causing moderate canal stenosis to 7 mm with ventral cord contour distortion and severe foraminal narrowing, worse on the right. 2. Moderate-to-severe right and moderate left foraminal stenosis at C4-5 due to rightward disc osteophyte complex. 3. Moderate right foraminal stenosis at C3-4 due to rightward disc osteophyte complex. 4. Moderate foraminal narrowing, worse on the right, at C6-7 due to broad-based disc osteophyte complex. 5. Mild right foraminal stenosis at C2-3 due to rightward disc osteophyte complex. 6. Slight retrolisthesis at C5-6 and C6-7.  PATIENT SURVEYS:  Oswestry Low Back Pain Disability Questionnaire:  OSWESTRY LBP DISABILITY Questionnaire  Date: 08/30/2024 Score  Pain intensity 3 The pain is fairly severe at the moment.  2. Personal care (washing, dressing, etc.) 3 I need some help but manage most of my  personal care.  3. Lifting 2 Pain prevents me from lifting heavy weights off  the floor, but I can manage if they are conveniently  placed, eg. on a table.  4. Walking 3 Pain prevents me from walking more  than 100 yards.   5. Sitting 2 Pain prevents me sitting more than one hour.   6. Standing 4 Pain prevents me from standing for more than  10 minutes.  7. Sleeping 3 Because of pain I have less than 4 hours sleep.   8. Sex life (if applicable) 3 My sex life is severely restricted by pain.   9. Social life 3 Pain has restricted my social life and I do not go out  as often.  10. Traveling 3 Pain restricts me to journeys of less than one hour.   Total 29/50; 58%   Interpretation of scores: Score Category Description  0-20% Minimal Disability The patient can cope with most living activities. Usually no treatment is indicated apart from advice on lifting, sitting and exercise  21-40% Moderate Disability The patient experiences more pain and difficulty with sitting, lifting and standing. Travel and social life are more difficult and they may be disabled from work. Personal care, sexual activity and sleeping are not grossly affected, and the patient can usually be managed by conservative means  41-60% Severe Disability Pain remains the main problem in this group, but activities of daily living are affected. These patients require a detailed investigation  61-80% Crippled Back pain impinges on all aspects of the patients life. Positive intervention is required  81-100% Bed-bound These patients are either bed-bound or exaggerating their symptoms  Reference: Adelle SPEAK, [place] PB. The Oswestry Disability Index. Spine 2000 Nov 15;25(22):2940-52; discussion 68.  Minimum detectable change (90% confidence): 10% points  NDI:  NECK DISABILITY INDEX  Date: 08/30/2024 Score  Pain intensity 3 = The pain is fairly severe at the moment  2. Personal care (  washing, dressing, etc.) 3 =  I need some help but can manage most of my personal care  3. Lifting 2 = Pain prevents me lifting heavy weights off the floor, but I can manage if they are  conveniently placed, for example on a table  4. Reading 4 =  I can hardly  read at all because of severe pain in my neck  5. Headaches 0 = I have no headaches at all  6. Concentration 1 =  I can concentrate fully when I want to with slight difficulty   7. Work 4 = I can hardly do any work at all  8. Driving 3 = I can't drive my car as long as I want because of moderate pain in my neck  9. Sleeping 1 = My sleep is slightly disturbed (less than 1 hr sleepless)  10. Recreation 4 =  I can hardly do any recreation activities because of pain in my neck  Total 25/50   Minimum Detectable Change (90% confidence): 5 points or 10% points  COGNITION: Overall cognitive status: Within functional limits for tasks assessed  SENSATION: WFL  POSTURE: rounded shoulders and forward head  PALPATION: Pt has a palpable mass on the L side of the transverse process of the C7-T1 region of the neck.   CERVICAL ROM:   Active ROM A/PROM (deg) eval  Flexion 50  Extension 43  Right lateral flexion 34  Left lateral flexion 35  Right rotation 52  Left rotation 43   (Blank rows = not tested)  UPPER EXTREMITY ROM:  WNL for flexion and abduction bilaterally  UPPER EXTREMITY MMT:  MMT Right eval Left eval  Shoulder flexion 5 5  Shoulder abduction 5 5   (Blank rows = not tested)  CERVICAL SPECIAL TESTS:  Not performed at this time due to time restratints.    TREATMENT DATE: 09/13/2024   TherEx:  Seated scapular retractions, 35#, 2x10 Seated lat pull downs, 35#, 2x10 Standing pallof press, 15#, 2x10 each side  Carries with tidal tank, 10#, 1 lap around the gym before pt noted to be feeling burning sensation in the LE, so discontinued  Standing lumbar extensions at wall with UE support, 2x10  Standing hip abduction at Matrix, 45#, 2x10 each LE    Manual:  Supine STM to cervical region to increase extensibility of the paraspinals Supine suboccipital release technique to decrease cervicalgia Supine manual traction performed in order to increase joint space in  cervical region for pain relief Supine cervical upglides/downglides, 30 sec bouts Supine UT/Levator stretch, 30 sec bouts to increase tissue extensibility of the cervical region     PATIENT EDUCATION:  Education details: Pt educated on role of PT and services provided during current POC, along with prognosis and information about the clinic.  Person educated: Patient Education method: Explanation Education comprehension: verbalized understanding  HOME EXERCISE PROGRAM:  Access Code: [email or web address] URL: [email or web address] Date: 09/05/2024 Prepared by: Sidra Simpers  Exercises - Seated Scapular Retraction  - 1 x daily - 7 x weekly - 3 sets - 10 reps - Seated Cervical Retraction  - 1 x daily - 7 x weekly - 3 sets - 10 reps - Seated Levator Scapulae Stretch  - 1 x daily - 7 x weekly - 1 sets - 2 reps - 30 hold - Seated Upper Trapezius Stretch  - 2 x daily - 7 x weekly - 1 sets - 2 reps - 30 hold - Seated Assisted Cervical Rotation with Towel  -  1 x daily - 7 x weekly - 3 sets - 10 reps - Cervical Extension AROM with Strap  - 1 x daily - 7 x weekly - 3 sets - 10 reps - Standing Lumbar Extension at Wall - Forearms  - 1 x daily - 7 x weekly - 3 sets - 10 reps - Clamshell  - 1 x daily - 7 x weekly - 3 sets - 10 reps - Prone Hip Extension with Bent Knee  - 1 x daily - 7 x weekly - 3 sets - 10 reps  ASSESSMENT:  CLINICAL IMPRESSION:  Pt responded well to the exercises and noted a slight reduction in overall pain with the lumbar extension exercises.  Pt advised to perform at home as part of HEP.  Pt continues to note improved progress and is surprised with how much progress is being made.  Pt encouraged to perform the lumbar extension exercises when his symptoms occur in the future.   Pt will continue to benefit from skilled therapy to address remaining deficits in order to improve overall QoL and return to PLOF.        OBJECTIVE IMPAIRMENTS: decreased activity tolerance,  decreased knowledge of condition, decreased mobility, difficulty walking, decreased ROM, hypomobility, impaired UE functional use, postural dysfunction, obesity, and pain.   ACTIVITY LIMITATIONS: carrying, lifting, bending, sitting, standing, squatting, continence, and locomotion level  PARTICIPATION LIMITATIONS: meal prep, cleaning, laundry, interpersonal relationship, driving, shopping, community activity, and yard work  PERSONAL FACTORS: Past/current experiences, Time since onset of injury/illness/exacerbation, and 3+ comorbidities: prior discectomy, anxiety, HTN, chronic back pain, HTN, sleep apnea are also affecting patient's functional outcome.   REHAB POTENTIAL: Fair ongoing issue in which surgicl intervention has not assisted in overall pain and recovery.  CLINICAL DECISION MAKING: Evolving/moderate complexity  EVALUATION COMPLEXITY: Moderate   GOALS: Goals reviewed with patient? Yes  SHORT TERM GOALS: Target date: 09/27/2024  Pt will be independent with HEP in order to demonstrate increased ability to perform tasks related to occupation/hobbies. Baseline: To be given at next visit Goal status: INITIAL  LONG TERM GOALS: Target date: 10/25/2024  Pt will improve the Oswestry Disability Questionnaire to be </=14 to demonstrate increased functional ability to perform tasks of daily living. Baseline: 29/50 Goal status: INITIAL  2.  Pt will demonstrate decrease in NDI by at least 19% in order to demonstrate clinically significant reduction in disability related to neck injury/pain. Baseline: 50% Goal status: INITIAL  3.  Pt will reduce overall pain level to 2/10 by utilizing a combination of stretching, strengthening exercises, and pain-reducing modalities in order to improve overall QoL. Baseline: 6/10 currently Goal status: INITIAL  PLAN:  PT FREQUENCY: 2x/week  PT DURATION: 8 weeks  PLANNED INTERVENTIONS: 97750- Physical Performance Testing, 97110-Therapeutic exercises,  97530- Therapeutic activity, 97112- Neuromuscular re-education, 97535- Self Care, [id number]- Manual therapy, 20560 (1-2 muscles), 20561 (3+ muscles)- Dry Needling, Patient/Family education, Joint mobilization, Spinal mobilization, Cryotherapy, and Moist heat  PLAN FOR NEXT SESSION:   STM to the cervical region avoiding the mass around the C7-T1 L side of the transverse process,  stretches to the UT's    Fonda Simpers, PT, DPT Physical Therapist - [facility]  [facility]  09/13/24, 9:10 AM  "

## 2024-09-13 NOTE — Progress Notes (Signed)
 PROVIDER NOTE: Interpretation of information contained herein should be left to medically-trained personnel. Specific patient instructions are provided elsewhere under Patient Instructions section of medical record. This document was created in part using AI and STT-dictation technology, any transcriptional errors that may result from this process are unintentional.  Patient: Dean Ford  Service: E/M Encounter  Provider: Eric DELENA Como, MD  DOB: 1976-04-24  Delivery: Face-to-face  Specialty: Interventional Pain Management  MRN: [id number]  Setting: Ambulatory outpatient facility  Specialty designation: 09  Type: New Patient  Location: Outpatient office facility  PCP: Franchot Isaiah DELENA, MD  DOS: 09/13/2024    Referring Prov.: Clois Fret, MD   Primary Reason(s) for Visit: Encounter for initial evaluation of one or more chronic problems (new to examiner) potentially causing chronic pain, and posing a threat to normal musculoskeletal function. (Level of risk: High) CC: Neck Pain (L>R), Shoulder Pain, Back Pain, and Leg Pain  HPI  Dean Ford is a 49 y.o. year old, male patient, who comes for the first time to our practice referred by Clois Fret, MD for our initial evaluation of his chronic pain. He has HTN (hypertension); Hypothyroidism; Adrenal insufficiency; Mixed dyslipidemia; Long term (current) use of opiate analgesic; Lumbar spondylosis; Avitaminosis D; Class 2 severe obesity due to excess calories with serious comorbidity and body mass index (BMI) of 38.0 to 38.9 in adult; DDD (degenerative disc disease), lumbar; L4-L5 disc bulge; Ligamentum flavum hypertrophy; OSA (obstructive sleep apnea); Lumbar facet hypertrophy (Multilevel); Lumbosacral radiculopathy at S1 (right-sided); Chronic pain syndrome; Chronic low back pain (Primary Area of Pain) (Bilateral) (R>L); Erectile dysfunction; Osteoarthritis of hip (Right); 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); Hypogonadism male; Rheumatoid arthritis (HCC); Carpal tunnel syndrome; Pharmacologic therapy; Disorder of skeletal system; Problems influencing health status; Rectal polyp; 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 Today he comes in for evaluation of his Neck Pain (L>R), Shoulder Pain, Back Pain, and Leg Pain  Pain Assessment: Location: Posterior, Right, Left Neck Radiating: Radiates into left shoulder down shoulder blade constant. Radaites often to upper left arm. Can sometimes shoot down left arm with certain positions. Onset: More than a month ago Duration: Chronic pain Quality: Burning, Stabbing, Constant, Aching, Throbbing, Shooting Severity: 6 /10 (subjective, self-reported pain score)  Effect on ADL: Limits ADLs Timing: Constant Modifying factors: Lying down, sitting, and sleeping BP: (!) 182/110 (Patient asymtomatic)  HR: 60  Onset and Duration: Sudden, Gradual, and Present longer than 3 months Cause of pain: Work related accident or event Severity: No change since onset, NAS-11 at its worse: 8/10, NAS-11 at its best: 4/10, NAS-11 now: 6/10, and NAS-11 on the average: 5/10 Timing: Morning, Night, During activity or exercise, and After activity or exercise Aggravating Factors: Bending, Intercourse (sex), Motion, Prolonged standing, Stooping , Twisting, Walking, Walking  uphill, Walking downhill, and Working Alleviating Factors: Lying down, Sitting, and Sleeping Associated Problems: Fatigue, Inability to concentrate, Numbness, Spasms, Weakness, and Pain that wakes patient up Quality of Pain: Aching, Burning, Cramping, Exhausting, Sharp, Stabbing, Throbbing, and Tingling  Previous Examinations or Tests: MRI scan and Neurosurgical evaluation Previous Treatments: Chiropractic manipulations, Facet blocks, Narcotic medications, Physical Therapy, and Stretching exercises  Dean Ford is being evaluated for possible interventional pain management therapies for the treatment of his chronic pain.  Discussed the use of AI scribe software for clinical note transcription with the patient, who gave verbal consent to proceed.  History of Present Illness   Dean Ford is a 49 year old male who presents for evaluation of cervical pain and consideration of epidural steroid injections. He was referred by Neurosurgery to consider a cervical spinal cord stimulator trial.  He has cervical pain that was intermittent for years and has been constant for the last couple of months. The pain is on the left side of the neck and radiates to the left shoulder blade and down the left arm. He describes the arm pain as an aching, cold sensation. He has no right-sided neck or arm pain.  Coughing or sneezing previously triggered painful contractions in the left arm, which have improved with chiropractic care focused on muscle knots. He is in physical therapy twice weekly and has completed two sessions.  An MRI showed right-sided cervical findings, but his symptoms are left-sided. He has not had prior cervical injections.  He reports weakness in the left arm and denies headaches. Neck and arm pain began months before his change to a new bed.  He reports chronic low back issues with numbness in the groin, mainly on the left. He has had lumbar epidural steroid injections, lumbar facet blocks, and  radiofrequency ablation without benefit. He also has numbness in the right leg with pain that can extend to the lateral leg and ankle.  He has been sleeping in a hammock for eight years but this is unchanged and not clearly linked to the onset of his current neck and arm pain.       Dean Ford has been informed that this initial visit was an evaluation only.  On the follow up appointment I will go over the results, including ordered tests and available interventional therapies. At that time he will have the opportunity to decide whether to proceed with offered therapies or not. In the event that Dean Ford prefers avoiding interventional options, this will conclude our involvement in the case.  Medication management recommendations may be provided upon request.  Patient informed that diagnostic tests may be ordered to assist in identifying underlying causes, narrow the list of differential diagnoses and aid in determining candidacy for (or contraindications to) planned therapeutic interventions.  Historic Controlled Substance Pharmacotherapy Review PMP and historical list of controlled substances: Oxycodone  IR 5 mg tablet (# 10) (last prescribed on hold 10/04/2023). Most recently prescribed controlled substance(s): Opioid Analgesic: None MME/day: 0 mg/day  Historical Monitoring: The patient  reports no history of drug use. List of prior UDS Testing: Lab Results  Component Value Date   ETH <10 03/15/2018   Historical Background Evaluation: [place] PMP: PDMP reviewed during this encounter. Review of the past 37-months conducted.             PMP NARX Score Report:  Narcotic: 040 Sedative: 020 Stimulant: 000 [place] Department of public safety, offender search: Engineer, Mining Information) Non-contributory Risk Assessment Profile: Aberrant behavior: None observed or detected today Risk factors for fatal opioid overdose: None identified today PMP NARX Overdose Risk Score: 330 Fatal overdose hazard ratio (HR):  Calculation deferred Non-fatal overdose hazard ratio (HR): Calculation deferred Risk of opioid abuse or dependence: 0.7-3.0% with  doses <= 36 MME/day and 6.1-26% with doses >= 120 MME/day. Substance use disorder (SUD) risk level: See below Personal History of Substance Abuse (SUD-Substance use disorder):  Alcohol:    Illegal Drugs:    Rx Drugs:    ORT Risk Level calculation:    ORT Scoring interpretation table:  Score <3 = Low Risk for SUD  Score between 4-7 = Moderate Risk for SUD  Score >8 = High Risk for Opioid Abuse   PHQ-2 Depression Scale:  Total score: 0  PHQ-2 Scoring interpretation table: (Score and probability of major depressive disorder)  Score 0 = No depression  Score 1 = 15.4% Probability  Score 2 = 21.1% Probability  Score 3 = 38.4% Probability  Score 4 = 45.5% Probability  Score 5 = 56.4% Probability  Score 6 = 78.6% Probability   PHQ-9 Depression Scale:  Total score: 0  PHQ-9 Scoring interpretation table:  Score 0-4 = No depression  Score 5-9 = Mild depression  Score 10-14 = Moderate depression  Score 15-19 = Moderately severe depression  Score 20-27 = Severe depression (2.4 times higher risk of SUD and 2.89 times higher risk of overuse)   Pharmacologic Plan: As per protocol, I have not taken over any controlled substance management, pending the results of ordered tests and/or consults.            Initial impression: Pending review of available data and ordered tests.  Meds  Current Medications[1]  Imaging Review  Cervical Imaging: Cervical MR wo contrast: Results for orders placed during the hospital encounter of 07/05/24 MR Cervical Spine Wo Contrast  Narrative EXAM: MRI CERVICAL SPINE WITHOUT CONTRAST 07/05/2024 04:34:08 PM  TECHNIQUE: Multiplanar multisequence MRI of the cervical spine was performed.  COMPARISON: None available.  CLINICAL HISTORY: Chronic neck pain down left arm with numbness and tingling.  FINDINGS:  BONES AND  ALIGNMENT: Mild straightening of the normal cervical lordosis is present. Slight retrolisthesis present at C5-C6 and C6-C7. Normal vertebral body heights. Bone marrow signal is unremarkable.  SPINAL CORD: Normal spinal cord size. No abnormal spinal cord signal. At C5-C6, the broad-based disc osteophyte complex distorts the ventral surface of the cord.  SOFT TISSUES: No paraspinal mass.  C2-C3: Rightward disc osteophyte complex results in mild right foraminal stenosis. No spinal canal stenosis.  C3-C4: Rightward disc osteophyte complex results in partial effacement of the ventral CSF and moderate right foraminal stenosis. No spinal canal stenosis.  C4-C5: Rightward disc osteophyte complex partially effaces the ventral CSF. Moderate-to-severe right and moderate left foraminal stenosis is present. No spinal canal stenosis.  C5-C6: Broad-based disc osteophyte complex effaces the ventral CSF and distorts the ventral surface of the cord. The canal is narrowed to 7 mm. Severe foraminal narrowing is worse on the right.  C6-C7: Broad-based disc osteophyte complex partially effaces the ventral CSF. Moderate foraminal narrowing is worse on the right. No spinal canal stenosis.  C7-T1: Asymmetric right-sided uncovertebral spurring is present without focal stenosis. No significant disc herniation. No spinal canal stenosis or neural foraminal narrowing.  IMPRESSION: 1. Broad-based disc osteophyte complex at C5-6 causing moderate canal stenosis to 7 mm with ventral cord contour distortion and severe foraminal narrowing, worse on the right. 2. Moderate-to-severe right and moderate left foraminal stenosis at C4-5 due to rightward disc osteophyte complex. 3. Moderate right foraminal stenosis at C3-4 due to rightward disc osteophyte complex. 4. Moderate foraminal narrowing, worse on the right, at C6-7 due to broad-based disc osteophyte complex. 5. Mild right foraminal stenosis at  C2-3  due to rightward disc osteophyte complex. 6. Slight retrolisthesis at C5-6 and C6-7.  Electronically signed by: Lonni Necessary MD 07/09/2024 12:15 PM EST RP Workstation: [id number]  Lumbosacral Imaging: Lumbar MR wo contrast: Results for orders placed during the hospital encounter of 07/05/24 MR LUMBAR SPINE WO CONTRAST  Narrative EXAM: MRI LUMBAR SPINE 07/05/2024 04:34:25 PM  TECHNIQUE: Multiplanar multisequence MRI of the lumbar spine was performed without the administration of intravenous contrast.  COMPARISON: MR Lumbar Spine without and with 04/18/2019.  CLINICAL HISTORY: Low back pain, history of multiple lumbar surgeries and complaint of chronic LBP with right leg numbness.  FINDINGS:  BONES AND ALIGNMENT: Normal alignment. Normal vertebral body heights. Bone marrow signal is unremarkable.  SPINAL CORD: The conus terminates normally.  SOFT TISSUES: No paraspinal mass.  L1-L2: Broad-based protrusion and moderate facet hypertrophy has progressed. No significant stenosis is present. No neural foraminal narrowing.  L2-L3: No significant disc herniation. No spinal canal stenosis or neural foraminal narrowing.  L3-L4: No significant disc herniation. No spinal canal stenosis or neural foraminal narrowing.  L4-L5: No significant disc herniation. No spinal canal stenosis or neural foraminal narrowing.  L5-S1: Right laminectomy is again noted at L5-S1. Chronic type 2 Modic changes are present on the right. Progressive severe right foraminal stenosis is present. No significant disc herniation. No spinal canal stenosis.  IMPRESSION: 1. Progressive severe right foraminal stenosis at L5-S1.  Electronically signed by: Lonni Necessary MD 07/09/2024 12:26 PM EST RP Workstation: [id number]  Lumbar MR w/wo contrast: Results for orders placed during the hospital encounter of 04/18/19 MR Lumbar Spine W Wo Contrast  Narrative CLINICAL DATA:  Initial  evaluation for chronic central low back pain with extension into the right and left. History of prior surgery.  EXAM: MRI LUMBAR SPINE WITHOUT AND WITH CONTRAST  TECHNIQUE: Multiplanar and multiecho pulse sequences of the lumbar spine were obtained without and with intravenous contrast.  CONTRAST:  10 cc of Gadavist .  COMPARISON:  Most recent MRI from 06/10/2018.  FINDINGS: Segmentation: Standard. Lowest well-formed disc labeled the L5-S1 level.  Alignment: 3 mm retrolisthesis of L5 on S1. Trace retrolisthesis of L4 on L5. Mild straightening of the normal lumbar lordosis.  Vertebrae: Vertebral body height maintained without evidence for acute or chronic fracture. Bone marrow signal intensity diffusely decreased on T1 weighted imaging, nonspecific, but most commonly related to anemia, smoking, or obesity. No discrete or worrisome osseous lesions. Mild reactive endplate changes present about the L5-S1 interspace. No other abnormal marrow edema or enhancement.  Conus medullaris and cauda equina: Conus extends to the L1 level. Conus and cauda equina appear normal.  Paraspinal and other soft tissues: Normal expected post surgical changes present within the lower posterior paraspinous soft tissues. Paraspinous soft tissues demonstrate no acute finding. Few scattered small simple cyst noted within the kidneys bilaterally. Visualized visceral structures otherwise unremarkable.  Disc levels:  T11-12: Unremarkable.  T12-L1: Unremarkable.  L1-2: Unremarkable.  L2-3: Negative interspace. Mild bilateral facet hypertrophy. No stenosis or impingement.  L3-4: Negative interspace. Mild facet hypertrophy. No stenosis or impingement.  L4-5: Mild diffuse disc bulge with disc desiccation. Superimposed broad-based shallow left foraminal/extraforaminal disc protrusion with associated annular fissure (series 8, image 28), unchanged from previous. Mild bilateral facet hypertrophy.  Borderline mild spinal stenosis, stable. Mild left greater than right L4 foraminal narrowing also unchanged.  L5-S1: Mild retrolisthesis. Chronic intervertebral disc space narrowing with diffuse disc bulge and disc desiccation. Reactive endplate changes with marginal endplate osteophytic spurring. Postoperative changes from prior  right hemi laminectomy with micro discectomy. Enhancing postoperative granulation tissue surrounds the descending right S1 nerve root. No definite significant residual or recurrent disc protrusion seen on today's exam. Central canal remains patent. Severe right with mild left L5 foraminal stenosis, stable.  IMPRESSION: 1. Postoperative changes from prior right hemi laminectomy and micro discectomy at L5-S1 with enhancing postoperative granulation tissue surrounding the descending right S1 nerve root. No definite residual or recurrent disc protrusion identified. 2. Persistent severe right neural foraminal stenosis at L5-S1, likely impinging upon the exiting right L5 nerve root. 3. Mild degenerative disc disease at L4-5 as above, stable.   Electronically Signed By: Morene Hoard M.D. On: 04/19/2019 01:59  Complexity Note: Imaging results reviewed.                         ROS  Cardiovascular: High blood pressure Pulmonary or Respiratory: Temporary stoppage of breathing during sleep Neurological: No reported neurological signs or symptoms such as seizures, abnormal skin sensations, urinary and/or fecal incontinence, being born with an abnormal open spine and/or a tethered spinal cord Psychological-Psychiatric: Difficulty sleeping and or falling asleep Gastrointestinal: Reflux or heatburn Genitourinary: No reported renal or genitourinary signs or symptoms such as difficulty voiding or producing urine, peeing blood, non-functioning kidney, kidney stones, difficulty emptying the bladder, difficulty controlling the flow of urine, or chronic kidney  disease Hematological: No reported hematological signs or symptoms such as prolonged bleeding, low or poor functioning platelets, bruising or bleeding easily, hereditary bleeding problems, low energy levels due to low hemoglobin or being anemic Endocrine: Slow thyroid  Rheumatologic: Joint aches and or swelling due to excess weight (Osteoarthritis) and Rheumatoid arthritis Musculoskeletal: Negative for myasthenia gravis, muscular dystrophy, multiple sclerosis or malignant hyperthermia Work History: Disabled  Allergies  Dean Ford is allergic to borax and testosterone .  Laboratory Chemistry Profile   Renal Lab Results  Component Value Date   BUN 13 01/14/2023   CREATININE 1.13 01/14/2023   BCR 12 01/14/2023   GFRAA 109 11/15/2019   GFRNONAA 94 11/15/2019   SPECGRAV 1.026 03/01/2015   PHUR 5.5 03/01/2015   PROTEINUR NEGATIVE 11/15/2019     Electrolytes Lab Results  Component Value Date   NA 142 01/14/2023   K 4.2 01/14/2023   CL 104 01/14/2023   CALCIUM  9.5 01/14/2023   MG 2.1 12/19/2012     Hepatic Lab Results  Component Value Date   AST 25 11/15/2019   ALT 43 01/14/2023   ALBUMIN 4.7 07/26/2019   ALKPHOS 54 07/26/2019     ID Lab Results  Component Value Date   HIV NON-REACTIVE 09/26/2018   SARSCOV2NAA NEGATIVE 08/13/2020     Bone No results found for: VD25OH, VD125OH2TOT, CI6874NY7, CI7874NY7, 25OHVITD1, 25OHVITD2, 25OHVITD3, TESTOFREE, TESTOSTERONE    Endocrine Lab Results  Component Value Date   GLUCOSE 119 (H) 01/14/2023   GLUCOSEU NEGATIVE 11/15/2019   HGBA1C 5.2 11/15/2019     Neuropathy Lab Results  Component Value Date   HGBA1C 5.2 11/15/2019   HIV NON-REACTIVE 09/26/2018     CNS No results found for: COLORCSF, APPEARCSF, RBCCOUNTCSF, WBCCSF, POLYSCSF, LYMPHSCSF, EOSCSF, PROTEINCSF, GLUCCSF, JCVIRUS, CSFOLI, IGGCSF, LABACHR, ACETBL   Inflammation (CRP: Acute  ESR: Chronic) No results found for:  CRP, ESRSEDRATE, LATICACIDVEN   Rheumatology No results found for: RF, ANA, LABURIC, URICUR, LYMEIGGIGMAB, LYMEABIGMQN, HLAB27   Coagulation Lab Results  Component Value Date   PLT 223 11/15/2019     Cardiovascular Lab Results  Component Value Date  TROPONINI <0.03 12/04/2014   TROPONINI <0.03 12/04/2014   HGB 14.7 11/15/2019   HCT 43.5 11/15/2019     Screening Lab Results  Component Value Date   SARSCOV2NAA NEGATIVE 08/13/2020   HIV NON-REACTIVE 09/26/2018     Cancer No results found for: CEA, CA125, LABCA2   Allergens No results found for: ALMOND, APPLE, ASPARAGUS, AVOCADO, BANANA, BARLEY, BASIL, BAYLEAF, GREENBEAN, LIMABEAN, WHITEBEAN, BEEFIGE, REDBEET, BLUEBERRY, BROCCOLI, CABBAGE, MELON, CARROT, CASEIN, CASHEWNUT, CAULIFLOWER, CELERY     Note: Lab results reviewed.  PFSH  Drug: Dean Ford  reports no history of drug use. Alcohol:  reports current alcohol use. Tobacco:  reports that he quit smoking about 28 years ago. His smoking use included cigarettes. He quit smokeless tobacco use about 28 years ago. Medical:  has a past medical history of Acute lumbar radiculopathy (Right) (S1) (05/26/2016), Acute myofascial pain (05/04/2017), Acute postoperative pain (06/22/2019), Adrenal insufficiency, Anxiety, Anxiety disorder (01/28/2015), Arthritis, Back pain, chronic (06/03/2015), Hypertension, Hypothyroid, Sleep apnea, and Thyroid  disease. Family: family history includes Heart attack (age of onset: 18) in his father; Hypertension in his father and sister; Osteoarthritis in his mother.  Past Surgical History:  Procedure Laterality Date   BACK SURGERY     COLONOSCOPY WITH PROPOFOL  N/A 02/23/2019   Procedure: COLONOSCOPY WITH PROPOFOL ;  Surgeon: Janalyn Keene NOVAK, MD;  Location: [facility] ENDOSCOPY;  Service: Endoscopy;  Laterality: N/A;   discectomy N/A 2017, 2018, 2019   x3  L5-S1   ELBOW SURGERY     right elbow    ESOPHAGOGASTRODUODENOSCOPY (EGD) WITH PROPOFOL  N/A 08/15/2020   Procedure: ESOPHAGOGASTRODUODENOSCOPY (EGD) WITH PROPOFOL ;  Surgeon: Jinny Carmine, MD;  Location: [facility] ENDOSCOPY;  Service: Endoscopy;  Laterality: N/A;   Active Ambulatory Problems    Diagnosis Date Noted   HTN (hypertension) 07/20/2014   Hypothyroidism 07/20/2014   Adrenal insufficiency 07/20/2014   Mixed dyslipidemia 03/01/2015   Long term (current) use of opiate analgesic 06/03/2015   Lumbar spondylosis 06/03/2015   Avitaminosis D 06/03/2015   Class 2 severe obesity due to excess calories with serious comorbidity and body mass index (BMI) of 38.0 to 38.9 in adult 06/03/2015   DDD (degenerative disc disease), lumbar 06/03/2015   L4-L5 disc bulge 06/03/2015   Ligamentum flavum hypertrophy 06/03/2015   OSA (obstructive sleep apnea) 06/03/2015   Lumbar facet hypertrophy (Multilevel) 06/11/2015   Lumbosacral radiculopathy at S1 (right-sided) 06/11/2015   Chronic pain syndrome 09/02/2015   Chronic low back pain (Primary Area of Pain) (Bilateral) (R>L) 12/18/2015   Erectile dysfunction 12/18/2015   Osteoarthritis of hip (Right) 01/22/2016   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/2016   Hypogonadism male 09/28/2018   Rheumatoid arthritis (HCC) 09/28/2018   Carpal tunnel syndrome 09/28/2018   Pharmacologic therapy 02/09/2019   Disorder of skeletal system 02/09/2019   Problems influencing health status 02/09/2019   Rectal polyp    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) 05/01/2019   History of marijuana use 05/18/2020   Pain medication agreement broken 05/18/2020   Nondiabetic gastroparesis 05/22/2020   Gastroesophageal reflux disease 07/26/2020   Problem with swallowing or mastication 10/12/2023   Stricture and stenosis of esophagus    Inattention 11/24/2023   Diverticulitis 10/03/2023   Cervicalgia 06/28/2024   Lumbar radiculopathy 06/28/2024    Neck pain of over 3 months duration 09/13/2024   Neck pain on left side 09/13/2024   Chronic neck pain 09/13/2024   Cervical neck pain with evidence of disc disease 09/13/2024  Hypothyroid 10/03/2023   Chronic pain disorder 09/02/2015   Disorder of intervertebral disc of lumbar spine 06/03/2015   Lumbar spondylosis 06/03/2015   Chronic upper extremity pain (Left) 09/13/2024   Cervical radiculopathy (Left) 09/13/2024   Resolved Ambulatory Problems    Diagnosis Date Noted   Chest discomfort 07/20/2014   Anxiety disorder 01/28/2015   Dehydration symptoms 01/28/2015   Acute left flank pain 03/01/2015   Encounter for therapeutic drug level monitoring 06/03/2015   Uncomplicated opioid dependence (HCC) 06/03/2015   Opiate use 06/03/2015   Lumbar facet syndrome (Bilateral) (R>L) 06/03/2015   Acute low back pain 06/03/2015   Insomnia, persistent 06/03/2015   Intermittent muscle cramps 06/03/2015   Always thirsty 06/03/2015   Lumbar spine pain 06/03/2015   Hip arthrosis 06/03/2015   Bulging lumbar disc 06/03/2015   Chronic sacroiliac joint pain (Bilateral) (R>L) 06/11/2015   Long term prescription opiate use 09/02/2015   Encounter for chronic pain management 09/02/2015   Spasm of paraspinal muscle 01/22/2016   Lower extremity numbness (Bilateral) (R>L) 01/22/2016   Chronic lumbar radicular pain (Right: S1, Bilateral: L5) (Bilateral) (R>L) 01/22/2016   Chronic hip pain (Right) 01/22/2016   Acute lumbar radiculopathy (Right) (S1) 05/26/2016   Acute myofascial pain 05/04/2017   Spondylosis without myelopathy or radiculopathy, lumbosacral region 02/08/2018   Chronic lower extremity pain (Secondary Area of Pain) (Bilateral) (R>L) 02/08/2018   Chronic musculoskeletal pain 04/25/2018   Occult blood in stools    Diverticulosis of large intestine without diverticulitis    DDD (degenerative disc disease), lumbosacral 06/22/2019   Acute postoperative pain 06/22/2019   Neurogenic pain  07/03/2019   History of illicit drug use 05/18/2020   Opioid-induced hyperalgesia 05/22/2020   Gastritis without bleeding    Morbid obesity with BMI of 40.0-44.9, adult (HCC) 12/02/2023   Past Medical History:  Diagnosis Date   Anxiety    Arthritis    Back pain, chronic 06/03/2015   Hypertension    Sleep apnea    Thyroid  disease    Constitutional Exam  General appearance: Well nourished, well developed, and well hydrated. In no apparent acute distress Vitals:   09/13/24 1426 09/13/24 1438  BP: (!) 199/124 (!) 182/110  Pulse: 60   Resp: 16   Temp: 98.9 F (37.2 C)   TempSrc: Temporal   SpO2: 98%   Weight: 290 lb (131.5 kg)   Height: 6' 2 (1.88 m)    BMI Assessment: Estimated body mass index is 37.23 kg/m as calculated from the following:   Height as of this encounter: 6' 2 (1.88 m).   Weight as of this encounter: 290 lb (131.5 kg).  BMI interpretation table: BMI level Category Range association with higher incidence of chronic pain  <18 kg/m2 Underweight   18.5-24.9 kg/m2 Ideal body weight   25-29.9 kg/m2 Overweight Increased incidence by 20%  30-34.9 kg/m2 Obese (Class I) Increased incidence by 68%  35-39.9 kg/m2 Severe obesity (Class II) Increased incidence by 136%  >40 kg/m2 Extreme obesity (Class III) Increased incidence by 254%   Patient's current BMI Ideal Body weight  Body mass index is 37.23 kg/m. Ideal body weight: 82.2 kg (181 lb 3.5 oz) Adjusted ideal body weight: 101.9 kg (224 lb 11.7 oz)   BMI Readings from Last 4 Encounters:  09/13/24 37.23 kg/m  07/27/24 37.11 kg/m  06/28/24 38.90 kg/m  12/28/23 40.73 kg/m   Wt Readings from Last 4 Encounters:  09/13/24 290 lb (131.5 kg)  07/27/24 289 lb (131.1 kg)  06/28/24 (!) 303  lb (137.4 kg)  12/28/23 (!) 317 lb 3.2 oz (143.9 kg)    Psych/Mental status: Alert, oriented x 3 (person, place, & time)       Eyes: PERLA Respiratory: No evidence of acute respiratory distress  Assessment  Primary  Diagnosis & Pertinent Problem List: The primary encounter diagnosis was Chronic pain syndrome. Diagnoses of Pharmacologic therapy, Disorder of skeletal system, Problems influencing health status, Neck pain of over 3 months duration, Neck pain on left side, Chronic neck pain, Cervical neck pain with evidence of disc disease, Chronic upper extremity pain (Left), and Cervical radiculopathy (Left) were also pertinent to this visit.  Visit Diagnosis (New problems to examiner): 1. Chronic pain syndrome   2. Pharmacologic therapy   3. Disorder of skeletal system   4. Problems influencing health status   5. Neck pain of over 3 months duration   6. Neck pain on left side   7. Chronic neck pain   8. Cervical neck pain with evidence of disc disease   9. Chronic upper extremity pain (Left)   10. Cervical radiculopathy (Left)    Plan of Care (Initial workup plan)  Note: Dean Ford was reminded that as per protocol, today's visit has been an evaluation only. We have not taken over the patient's controlled substance management.  Problem-specific plan: Assessment and Plan    Left cervical radiculopathy   Chronic left-sided cervical radiculopathy presents with pain radiating to the shoulder blades and down the left arm, along with weakness and a cold, achy sensation. No headaches are reported. MRI showed right-sided findings, but symptoms are left-sided. Chiropractic care and physical therapy have been initiated. He has not had prior cervical epidural steroid injections. A neurosurgery referral for a spinal cord stimulator trial was declined. A cervical spine x-ray with flexion and extension views is ordered to assess for abnormal movement. A left-sided cervical epidural steroid injection is scheduled. He is advised to take blood pressure medication early on the day of the procedure and avoid high-sodium foods for two days prior. Blood pressure should be monitored; if diastolic is 100 or above, the procedure may  be canceled.  Chronic lumbar radiculopathy   He experiences numbness in the left groin area, possibly related to cervical radiculopathy or lumbar spine issues. Previous lumbar epidural steroid injections and radiofrequency ablation were ineffective. MRI included thoracic and lumbar regions. Right leg pain and numbness post-vasectomy may be related to lumbar spine issues. The MRI was reviewed to assess for any issues at T12-L1 or L1-L2 levels. Response to the cervical epidural steroid injection will be monitored for potential relief of groin numbness.       Lab Orders         Comp. Metabolic Panel (12)         Magnesium         Vitamin B12         Sedimentation rate         25-Hydroxy vitamin D  Lcms D2+D3         C-reactive protein     Imaging Orders         DG Cervical Spine With Flex & Extend     Referral Orders  No referral(s) requested today   Procedure Orders         Cervical Epidural Injection     Pharmacotherapy (current): Medications ordered:  No orders of the defined types were placed in this encounter.  Medications administered during this visit: Dean DOROTHA Rollen had no medications administered  during this visit.   Analgesic Pharmacotherapy:  Opioid Analgesics: For patients currently taking or requesting to take opioid analgesics, in accordance with [place]  Medical Board Guidelines, we will assess their risks and indications for the use of these substances. After completing our evaluation, we may offer recommendations, but we no longer take patients for medication management. The prescribing physician will ultimately decide, based on his/her training and level of comfort whether to adopt any of the recommendations, including whether or not to prescribe such medicines.  Membrane stabilizer: To be determined at a later time  Muscle relaxant: To be determined at a later time  NSAID: To be determined at a later time  Other analgesic(s): To be determined at a later time    Interventional management options: Dean Ford was informed that there is no guarantee that he would be a candidate for interventional therapies. The decision will be based on the results of diagnostic studies, as well as Dean Ford risk profile.  Procedure(s) under consideration:  Pending results of ordered studies     Interventional Therapies  Risk Factors  Considerations  Medical Comorbidities:  Anxiety   Planned  Pending:   Diagnostic/therapeutic left cervical ESI #1  Note: Patient declined to be evaluated or processed for spinal cord stimulator trial.    Under consideration:   Diagnostic/therapeutic left cervical ESI #1  No lumbar RFA (see 06/22/2019)    Completed: (Analgesic benefit)1  Palliative Caudal ESI  Palliative right L5-S1 LESI  Palliative right lumbar facet MBB x5 (05/23/2019) (100/100/100/75)  Diagnostic left lumbar facet MBB x2 (05/23/2019) (100/100/100/75)  Palliative right lumbar facet RFA x2 (06/22/2019) (RFA aborted). (Unreliable stimulation during testing.) Palliative right SI joint block x2  Palliative right IA hip joint injection  Palliative Lumbar TPI/MNB x2 (05/04/2017)    Therapeutic  Palliative (PRN) options:   None established   Completed by other providers:   None reported  1(Analgesic benefit): Expressed in percentage (%). (Local anesthetic[LA] +/- sedation  L.A.Local Anesthetic  Steroid benefit  Ongoing benefit)   Provider-requested follow-up: Return for [facility]): (L) CESI #1.  Future Appointments  Date Time Provider Department Center  09/19/2024 10:30 AM Ubaldo Grate, PT [facility] None  09/21/2024  9:30 AM Clois Fret, MD [facility]  09/21/2024  3:15 PM Ubaldo Grate, PT [facility] None  09/25/2024  2:30 PM Ubaldo, Brittany, PT [facility] None  09/27/2024  2:30 PM Ubaldo, Brittany, PT [facility] None  09/28/2024  8:20 AM Franchot Isaiah LABOR, MD [facility] [place]  10/02/2024 10:30 AM Ubaldo Grate, PT [facility] None   10/04/2024 11:15 AM Ubaldo Grate, PT [facility] None  10/09/2024  9:30 AM Leigh Venetia CROME, MD [facility] None  10/10/2024 10:30 AM [facility] PT SUB THERAPIST 1 [facility] None  10/12/2024 10:30 AM Copeland, Brittany, PT [facility] None  10/17/2024 11:15 AM Ubaldo, Brittany, PT [facility] None  10/19/2024 11:15 AM Ubaldo, Brittany, PT [facility] None  10/24/2024 11:15 AM Ubaldo, Brittany, PT [facility] None  10/26/2024 10:30 AM Ubaldo, Brittany, PT [facility] None  10/31/2024 11:15 AM Ubaldo, Brittany, PT [facility] None  11/02/2024 11:15 AM Ubaldo, Brittany, PT [facility] None  11/07/2024 11:15 AM Ubaldo, Brittany, PT [facility] None  11/09/2024 11:15 AM Ubaldo, Brittany, PT [facility] None  12/26/2024  8:50 AM [facility] ANNUAL WELLNESS VISIT [facility] [address]   I discussed the assessment and treatment plan with the patient. The patient was provided an opportunity to ask questions and all were answered. The patient agreed with the plan and demonstrated an understanding of the instructions.  Patient  advised to call back or seek an in-person evaluation if the symptoms or condition worsens.  Duration of encounter: 45 minutes.  Total time on encounter, as per AMA guidelines included both the face-to-face and non-face-to-face time personally spent by the physician and/or other qualified health care professional(s) on the day of the encounter (includes time in activities that require the physician or other qualified health care professional and does not include time in activities normally performed by clinical staff). Physician's time may include the following activities when performed: Preparing to see the patient (e.g., pre-charting review of records, searching for previously ordered imaging, lab work, and nerve conduction tests) Review of prior analgesic pharmacotherapies. Reviewing PMP Interpreting ordered tests (e.g., lab work, imaging, nerve conduction tests) Performing post-procedure evaluations,  including interpretation of diagnostic procedures Obtaining and/or reviewing separately obtained history Performing a medically appropriate examination and/or evaluation Counseling and educating the patient/family/caregiver Ordering medications, tests, or procedures Referring and communicating with other health care professionals (when not separately reported) Documenting clinical information in the electronic or other health record Independently interpreting results (not separately reported) and communicating results to the patient/ family/caregiver Care coordination (not separately reported)  Note by: Eric DELENA Como, MD (TTS and AI technology used. I apologize for any typographical errors that were not detected and corrected.) Date: 09/13/2024; Time: 5:26 PM     [1]  Current Outpatient Medications:    carvedilol  (COREG ) 6.25 MG tablet, Take 1 tablet (6.25 mg total) by mouth 2 (two) times daily., Disp: 180 tablet, Rfl: 3   levothyroxine  (SYNTHROID ) 125 MCG tablet, Take 1 tablet (125 mcg total) by mouth every morning on an empty stomach, Disp: 30 tablet, Rfl: 6   olmesartan  (BENICAR ) 40 MG tablet, Take 1 tablet (40 mg total) by mouth daily., Disp: 30 tablet, Rfl: 11   predniSONE  (DELTASONE ) 5 MG tablet, Take 3-6 tablets (15-30 mg total) by mouth daily as directed., Disp: 360 tablet, Rfl: 5   sildenafil  (VIAGRA ) 100 MG tablet, Take 1 tablet (100 mg total) by mouth daily as needed., Disp: 60 tablet, Rfl: 4   SYRINGE-NEEDLE, DISP, 3 ML (B-D 3CC LUER-LOK SYR 23GX1-1/2) 23G X 1-1/2 3 ML MISC, Use every 2 weeks as directed, Disp: 25 each, Rfl: 5   testosterone  cypionate (DEPOTESTOSTERONE CYPIONATE) 200 MG/ML injection, Inject 0.5 mLs (100 mg total) into the muscle every 14 (fourteen) days., Disp: 2 mL, Rfl: 5   fludrocortisone  (FLORINEF ) 0.1 MG tablet, Take 1 tablet (0.1 mg total) by mouth 2 (two) times daily. (Patient not taking: Reported on 09/13/2024), Disp: 60 tablet, Rfl: 12   omeprazole   (PRILOSEC) 40 MG capsule, Take one pill once daily 30 minutes prior to last meal of the day. (Patient not taking: Reported on 09/13/2024), Disp: 90 capsule, Rfl: 3   predniSONE  (DELTASONE ) 5 MG tablet, Take 2 tablets (10 mg total) by mouth in the morning AND 1 tablet (5 mg total) every evening. (Patient not taking: No sig reported), Disp: 360 tablet, Rfl: 4

## 2024-09-14 LAB — VITAMIN B12: Vitamin B-12: 300 pg/mL (ref 232–1245)

## 2024-09-14 LAB — COMP. METABOLIC PANEL (12)
AST: 21 [IU]/L (ref 0–40)
Albumin: 4.2 g/dL (ref 4.1–5.1)
Alkaline Phosphatase: 60 [IU]/L (ref 47–123)
BUN/Creatinine Ratio: 11 (ref 9–20)
BUN: 11 mg/dL (ref 6–24)
Bilirubin Total: 0.6 mg/dL (ref 0.0–1.2)
Calcium: 9.4 mg/dL (ref 8.7–10.2)
Chloride: 103 mmol/L (ref 96–106)
Creatinine, Ser: 0.99 mg/dL (ref 0.76–1.27)
Globulin, Total: 1.9 g/dL (ref 1.5–4.5)
Glucose: 79 mg/dL (ref 70–99)
Potassium: 4.2 mmol/L (ref 3.5–5.2)
Sodium: 144 mmol/L (ref 134–144)
Total Protein: 6.1 g/dL (ref 6.0–8.5)
eGFR: 93 mL/min/{1.73_m2}

## 2024-09-14 LAB — SPECIMEN STATUS REPORT

## 2024-09-14 LAB — C-REACTIVE PROTEIN: CRP: <1 mg/L (ref 0–10)

## 2024-09-14 LAB — MAGNESIUM: Magnesium: 2.2 mg/dL (ref 1.6–2.3)

## 2024-09-14 LAB — SEDIMENTATION RATE: Sed Rate: 6 mm/h (ref 0–15)

## 2024-09-19 ENCOUNTER — Ambulatory Visit

## 2024-09-19 DIAGNOSIS — R29898 Other symptoms and signs involving the musculoskeletal system: Secondary | ICD-10-CM

## 2024-09-19 DIAGNOSIS — M5459 Other low back pain: Secondary | ICD-10-CM

## 2024-09-19 DIAGNOSIS — M5386 Other specified dorsopathies, lumbar region: Secondary | ICD-10-CM

## 2024-09-19 DIAGNOSIS — M542 Cervicalgia: Secondary | ICD-10-CM

## 2024-09-21 ENCOUNTER — Ambulatory Visit

## 2024-09-21 ENCOUNTER — Ambulatory Visit: Admitting: Neurosurgery

## 2024-09-21 DIAGNOSIS — M542 Cervicalgia: Secondary | ICD-10-CM

## 2024-09-21 DIAGNOSIS — M5459 Other low back pain: Secondary | ICD-10-CM

## 2024-09-21 DIAGNOSIS — M5386 Other specified dorsopathies, lumbar region: Secondary | ICD-10-CM

## 2024-09-21 DIAGNOSIS — R29898 Other symptoms and signs involving the musculoskeletal system: Secondary | ICD-10-CM

## 2024-09-21 NOTE — Therapy (Signed)
 " OUTPATIENT PHYSICAL THERAPY CERVICAL/LUMBAR TREATMENT   Patient Name: Dean Ford MRN: [id number] DOB:1976/07/09, 49 y.o., male Today's Date: 09/21/2024  END OF SESSION:  PT End of Session - 09/21/24 1520     Visit Number 6    Number of Visits 17    Date for Recertification  10/25/24    PT Start Time 1521    PT Stop Time 1559    PT Time Calculation (min) 38 min            Past Medical History:  Diagnosis Date   Acute lumbar radiculopathy (Right) (S1) 05/26/2016   Acute myofascial pain 05/04/2017   Acute postoperative pain 06/22/2019   Adrenal insufficiency    Anxiety    Anxiety disorder 01/28/2015   Arthritis    Back pain, chronic 06/03/2015   Hypertension    Hypothyroid    Sleep apnea    Thyroid  disease    Past Surgical History:  Procedure Laterality Date   BACK SURGERY     COLONOSCOPY WITH PROPOFOL  N/A 02/23/2019   Procedure: COLONOSCOPY WITH PROPOFOL ;  Surgeon: Dean Keene NOVAK, MD;  Location: [facility];  Service: Endoscopy;  Laterality: N/A;   discectomy N/A 2017, 2018, 2019   x3  L5-S1   ELBOW SURGERY     right elbow   ESOPHAGOGASTRODUODENOSCOPY (EGD) WITH PROPOFOL  N/A 08/15/2020   Procedure: ESOPHAGOGASTRODUODENOSCOPY (EGD) WITH PROPOFOL ;  Surgeon: Dean Carmine, MD;  Location: [facility] ENDOSCOPY;  Service: Endoscopy;  Laterality: N/A;   Patient Active Problem List   Diagnosis Date Noted   Neck pain of over 3 months duration 09/13/2024   Neck pain on left side 09/13/2024   Chronic neck pain 09/13/2024   Cervical neck pain with evidence of disc disease 09/13/2024   Chronic upper extremity pain (Left) 09/13/2024   Cervical radiculopathy (Left) 09/13/2024   Cervicalgia 06/28/2024   Lumbar radiculopathy 06/28/2024   Inattention 11/24/2023   Problem with swallowing or mastication 10/12/2023   Diverticulitis 10/03/2023   Hypothyroid 10/03/2023   Stricture and stenosis of esophagus    Gastroesophageal reflux disease 07/26/2020   Nondiabetic  gastroparesis 05/22/2020    Class: History of   History of marijuana use 05/18/2020   Pain medication agreement broken 05/18/2020   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) 05/01/2019   Rectal polyp    Pharmacologic therapy 02/09/2019   Disorder of skeletal system 02/09/2019   Problems influencing health status 02/09/2019   Hypogonadism male 09/28/2018   Rheumatoid arthritis (HCC) 09/28/2018   Carpal tunnel syndrome 09/28/2018   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/2016   Osteoarthritis of hip (Right) 01/22/2016   Chronic low back pain (Primary Area of Pain) (Bilateral) (R>L) 12/18/2015   Erectile dysfunction 12/18/2015   Chronic pain syndrome 09/02/2015   Chronic pain disorder 09/02/2015   Lumbar facet hypertrophy (Multilevel) 06/11/2015   Lumbosacral radiculopathy at S1 (right-sided) 06/11/2015   Long term (current) use of opiate analgesic 06/03/2015   Lumbar spondylosis 06/03/2015   Avitaminosis D 06/03/2015   Class 2 severe obesity due to excess calories with serious comorbidity and body mass index (BMI) of 38.0 to 38.9 in adult 06/03/2015   DDD (degenerative disc disease), lumbar 06/03/2015   L4-L5 disc bulge 06/03/2015   Ligamentum flavum hypertrophy 06/03/2015   OSA (obstructive sleep apnea) 06/03/2015   Disorder of intervertebral disc of lumbar spine 06/03/2015   Lumbar spondylosis 06/03/2015   Mixed dyslipidemia 03/01/2015   HTN (hypertension) 07/20/2014  Hypothyroidism 07/20/2014   Adrenal insufficiency 07/20/2014    PCP: Dean Isaiah LABOR, MD   REFERRING PROVIDER: Clois Fret, MD   REFERRING DIAG:  M54.2 (ICD-10-CM) - Cervicalgia  M54.12 (ICD-10-CM) - Cervical radiculopathy  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  Pt noted 4-5/10 in the lumbar region as well.  Pt reports fall on ice about a week ago.   Eval: Pt reports that he had 3 discectomies in 3 years back in 2017-2019 in the lumbar spine.  Pt reports that he still has reduced sensation in the R LE and never fully recovered from it.    Pt is now having cervical pain at this point in time, and he feels as though this is the most pressing issue at the moment.  Pt notes that the MRI shows the complications to be more on the R side, however the pt is reporting significant pain and radicular symptoms in the L UE.  Pt notes that there is a hard mass in the cervical region as well and feels that it is complicating issues.  [facility] in [place].   Hand dominance: Right  PERTINENT HISTORY:   Per MD: [name] is here today with a chief complaint of chronic neck and back pain who presents with worsening neck pain and left arm symptoms.   He has had neck problems for about ten years with worsening symptoms over the past few months.   He now has neck pain radiating into the left arm with numbness and tingling into the fourth and fifth fingers. Coughing or sneezing worsens the pain, which can travel up the arm. He notes tightness around the left shoulder blade and muscle tension. Chiropractic treatment for three weeks (without spinal manipulation) has reduced muscle knots but has not relieved the radicular symptoms.   He has had three prior back surgeries,  most recently in  late 2019 or early 2020 in [place], with persistent right leg numbness since that time. He also reports intermittent numbness in the groin that has improved with chiropractic care. He is on full disability for spine and other medical issues.  PAIN:  Are you having pain? Yes: NPRS scale: 6/10 current; 7/10 at worst Pain location: cervical pain Pain description: throbbing, stabbing Aggravating factors: sitting Relieving factors: raising head and   PRECAUTIONS: None  RED FLAGS: Cauda equina syndrome: Yes: Pt reports that he feels numbness during intercourse and when sitting for prolonged period of time.  Pt notes that he is also not feeling the flow during urination.     WEIGHT BEARING RESTRICTIONS: No  FALLS:  Has patient fallen in last 6 months? No  LIVING ENVIRONMENT: Lives with: lives with their family Lives in: House/apartment Stairs: Yes: Internal: 15 steps; on right going up and External: 3 steps; on right going up, on left going up, and bilateral but cannot reach both Has following equipment at home: None  OCCUPATION: On disability.  PLOF: Independent  PATIENT GOALS: Pt   NEXT MD VISIT: 09/13/24  OBJECTIVE:  Note: Objective measures were completed at Evaluation unless otherwise noted.  DIAGNOSTIC FINDINGS:   EXAM: MRI CERVICAL SPINE WITHOUT CONTRAST 07/05/2024 04:34:08 PM  IMPRESSION: 1. Broad-based disc osteophyte complex at C5-6 causing moderate canal stenosis to 7 mm with ventral cord contour distortion and severe foraminal narrowing, worse on the right. 2. Moderate-to-severe right and moderate left foraminal stenosis at C4-5 due to rightward disc osteophyte complex. 3. Moderate right foraminal stenosis at C3-4 due to rightward disc osteophyte complex. 4. Moderate foraminal narrowing, worse on the right, at C6-7 due to broad-based disc osteophyte complex. 5. Mild right foraminal stenosis at C2-3 due to rightward disc  osteophyte complex. 6. Slight retrolisthesis at C5-6 and C6-7.  PATIENT SURVEYS:  Oswestry Low Back Pain Disability Questionnaire:  OSWESTRY LBP DISABILITY Questionnaire  Date: 08/30/2024 Score  Pain intensity 3 The pain is fairly severe at the moment.  2. Personal care (washing, dressing, etc.) 3 I need some help but manage most of my  personal care.  3. Lifting 2 Pain prevents me from lifting heavy weights off  the floor, but I can manage if they are conveniently  placed, eg. on a table.  4. Walking 3 Pain prevents me from walking more than 100 yards.   5. Sitting 2 Pain prevents me sitting more than one hour.   6. Standing 4 Pain prevents me from standing for more than  10 minutes.  7. Sleeping 3 Because of pain I have less than 4 hours sleep.   8. Sex life (if applicable) 3 My sex life is severely restricted by pain.   9. Social life 3 Pain has restricted my social life and I do not go out  as often.  10. Traveling 3 Pain restricts me to journeys of less than one hour.   Total 29/50; 58%   Interpretation of scores: Score Category Description  0-20% Minimal Disability The patient can cope with most living activities. Usually no treatment is indicated apart from advice on lifting, sitting and exercise  21-40% Moderate Disability The patient experiences more pain and difficulty with sitting, lifting and standing. Travel and social life are more difficult and they may be disabled from work. Personal care, sexual activity and sleeping are not grossly affected, and the patient can usually be managed by conservative means  41-60% Severe Disability Pain remains the main problem in this  group, but activities of daily living are affected. These patients require a detailed investigation  61-80% Crippled Back pain impinges on all aspects of the patients life. Positive intervention is required  81-100% Bed-bound These patients are either bed-bound or exaggerating their symptoms  Reference: Adelle SPEAK, Pynsent PB. The Oswestry Disability Index. Spine 2000 Nov 15;25(22):2940-52; discussion 15.  Minimum detectable change (90% confidence): 10% points  NDI:  NECK DISABILITY INDEX  Date: 08/30/2024 Score  Pain intensity 3 = The pain is fairly severe at the moment  2. Personal care (washing, dressing, etc.) 3 =  I need some help but can manage most of my personal care  3. Lifting 2 = Pain prevents me lifting heavy weights off the floor, but I can manage if they are  conveniently placed, for example on a table  4. Reading 4 =  I can hardly read at all because of severe pain in my neck  5. Headaches 0 = I have no headaches at all  6. Concentration 1 =  I can concentrate fully when I want to with slight difficulty   7. Work 4 = I can hardly do any work at all  8. Driving 3 = I can't drive my car as long as I want because of moderate pain in my neck  9. Sleeping 1 = My sleep is slightly disturbed (less than 1 hr sleepless)  10. Recreation 4 =  I can hardly do any recreation activities because of pain in my neck  Total 25/50   Minimum Detectable Change (90% confidence): 5 points or 10% points  COGNITION: Overall cognitive status: Within functional limits for tasks assessed  SENSATION: WFL  POSTURE: rounded shoulders and forward head  PALPATION: Pt has a palpable mass on the L side of the transverse process of the C7-T1 region of the neck.   CERVICAL ROM:   Active ROM A/PROM (deg) eval  Flexion 50  Extension 43  Right lateral flexion 34  Left lateral flexion 35  Right rotation 52  Left rotation 43   (Blank rows = not tested)  UPPER EXTREMITY ROM:  WNL for flexion and abduction bilaterally  UPPER EXTREMITY MMT:  MMT Right eval Left eval  Shoulder flexion 5 5  Shoulder abduction 5 5   (Blank rows = not tested)  CERVICAL SPECIAL TESTS:  Not performed at this time due to time restratints.    TREATMENT DATE: 09/21/2024  TherEx:  Seated Lat pull down, 45#, 2 x  12 Seated Chest press, 45#, 2 x 12  Standing high rows OMEGA, 35# 2 x 12  Standing wall angels with 3#, 2 x 12   Manual:  Supine STM to cervical region to increase extensibility of the paraspinals Supine suboccipital release technique to decrease cervicalgia Supine manual traction performed in order to increase joint space in cervical region for pain relief Supine cervical upglides/downglides, 30 sec bouts Supine UT/Levator stretch, 30 sec bouts to increase tissue extensibility of the cervical region  Supine long axis distraction of the B UE for pain relief and to increase joint spacing, 30 sec bouts  PATIENT EDUCATION:  Education details: Pt educated on role of PT and services provided during current POC, along with prognosis and information about the clinic.  Person educated: Patient Education method: Explanation Education comprehension: verbalized understanding  HOME EXERCISE PROGRAM:  Access Code: [email or web address] URL: [email or web address] Date: 09/05/2024 Prepared by: Sidra Simpers  Exercises - Seated Scapular Retraction  - 1 x daily - 7 x weekly -  3 sets - 10 reps - Seated Cervical Retraction  - 1 x daily - 7 x weekly - 3 sets - 10 reps - Seated Levator Scapulae Stretch  - 1 x daily - 7 x weekly - 1 sets - 2 reps - 30 hold - Seated Upper Trapezius Stretch  - 2 x daily - 7 x weekly - 1 sets - 2 reps - 30 hold - Seated Assisted Cervical Rotation with Towel  - 1 x daily - 7 x weekly - 3 sets - 10 reps - Cervical Extension AROM with Strap  - 1 x daily - 7 x weekly - 3 sets - 10 reps - Standing Lumbar Extension at Wall - Forearms  - 1 x daily - 7 x weekly - 3 sets - 10 reps - Clamshell  - 1 x daily - 7 x weekly - 3 sets - 10 reps - Prone Hip Extension with Bent Knee  - 1 x daily - 7 x weekly - 3 sets - 10 reps  ASSESSMENT:  CLINICAL IMPRESSION:  Pt responded well to the exercises and noted a slight increase in overall pain within the lumbar region.  Pt advised to  perform at home as part of HEP. Pt continues to have limited shoulder ROM and pain with overhead activities. Pt encouraged to perform the lumbar extension exercises when his symptoms occur in the future such as prone press ups and standing extension activities. Pt will continue to benefit from skilled therapy to address remaining deficits in order to improve overall QoL and return to PLOF.     OBJECTIVE IMPAIRMENTS: decreased activity tolerance, decreased knowledge of condition, decreased mobility, difficulty walking, decreased ROM, hypomobility, impaired UE functional use, postural dysfunction, obesity, and pain.   ACTIVITY LIMITATIONS: carrying, lifting, bending, sitting, standing, squatting, continence, and locomotion level  PARTICIPATION LIMITATIONS: meal prep, cleaning, laundry, interpersonal relationship, driving, shopping, community activity, and yard work  PERSONAL FACTORS: Past/current experiences, Time since onset of injury/illness/exacerbation, and 3+ comorbidities: prior discectomy, anxiety, HTN, chronic back pain, HTN, sleep apnea are also affecting patient's functional outcome.   REHAB POTENTIAL: Fair ongoing issue in which surgicl intervention has not assisted in overall pain and recovery.  CLINICAL DECISION MAKING: Evolving/moderate complexity  EVALUATION COMPLEXITY: Moderate   GOALS: Goals reviewed with patient? Yes  SHORT TERM GOALS: Target date: 09/27/2024  Pt will be independent with HEP in order to demonstrate increased ability to perform tasks related to occupation/hobbies. Baseline: To be given at next visit Goal status: INITIAL  LONG TERM GOALS: Target date: 10/25/2024  Pt will improve the Oswestry Disability Questionnaire to be </=14 to demonstrate increased functional ability to perform tasks of daily living. Baseline: 29/50 Goal status: INITIAL  2.  Pt will demonstrate decrease in NDI by at least 19% in order to demonstrate clinically significant reduction in  disability related to neck injury/pain. Baseline: 50% Goal status: INITIAL  3.  Pt will reduce overall pain level to 2/10 by utilizing a combination of stretching, strengthening exercises, and pain-reducing modalities in order to improve overall QoL. Baseline: 6/10 currently Goal status: INITIAL  PLAN:  PT FREQUENCY: 2x/week  PT DURATION: 8 weeks  PLANNED INTERVENTIONS: 97750- Physical Performance Testing, 97110-Therapeutic exercises, 97530- Therapeutic activity, [id number]- Neuromuscular re-education, 97535- Self Care, [id number]- Manual therapy, 20560 (1-2 muscles), 20561 (3+ muscles)- Dry Needling, Patient/Family education, Joint mobilization, Spinal mobilization, Cryotherapy, and Moist heat  PLAN FOR NEXT SESSION:   STM to the cervical region avoiding the mass around the C7-T1 L side  of the transverse process,  stretches to the UT's  Laymon GORMAN Perfect, PT, DT Physical Therapist - [facility]   09/21/24, 3:21 PM  "

## 2024-09-25 ENCOUNTER — Ambulatory Visit

## 2024-09-27 ENCOUNTER — Ambulatory Visit

## 2024-09-28 ENCOUNTER — Ambulatory Visit

## 2024-10-02 ENCOUNTER — Ambulatory Visit

## 2024-10-04 ENCOUNTER — Ambulatory Visit

## 2024-10-09 ENCOUNTER — Encounter: Payer: Self-pay | Admitting: Neurology

## 2024-10-10 ENCOUNTER — Ambulatory Visit: Admitting: Physical Therapy

## 2024-10-12 ENCOUNTER — Ambulatory Visit

## 2024-10-17 ENCOUNTER — Ambulatory Visit: Admitting: Neurosurgery

## 2024-10-17 ENCOUNTER — Ambulatory Visit

## 2024-10-19 ENCOUNTER — Ambulatory Visit

## 2024-10-24 ENCOUNTER — Ambulatory Visit

## 2024-10-26 ENCOUNTER — Ambulatory Visit

## 2024-10-31 ENCOUNTER — Ambulatory Visit

## 2024-11-02 ENCOUNTER — Ambulatory Visit

## 2024-11-07 ENCOUNTER — Ambulatory Visit

## 2024-11-09 ENCOUNTER — Ambulatory Visit

## 2024-12-26 ENCOUNTER — Ambulatory Visit
